# Patient Record
Sex: Male | Born: 1960 | Race: White | Hispanic: No | Marital: Married | Smoking: Former smoker
Health system: Southern US, Community
[De-identification: ages and names within clinical notes are randomized; demographics above are authoritative.]

## PROBLEM LIST (undated history)

## (undated) DIAGNOSIS — G4730 Sleep apnea, unspecified: Secondary | ICD-10-CM

## (undated) DIAGNOSIS — H9190 Unspecified hearing loss, unspecified ear: Secondary | ICD-10-CM

## (undated) DIAGNOSIS — Z87442 Personal history of urinary calculi: Secondary | ICD-10-CM

## (undated) DIAGNOSIS — N529 Male erectile dysfunction, unspecified: Secondary | ICD-10-CM

## (undated) DIAGNOSIS — F17200 Nicotine dependence, unspecified, uncomplicated: Secondary | ICD-10-CM

## (undated) DIAGNOSIS — M1990 Unspecified osteoarthritis, unspecified site: Secondary | ICD-10-CM

## (undated) DIAGNOSIS — R51 Headache: Secondary | ICD-10-CM

## (undated) DIAGNOSIS — I2510 Atherosclerotic heart disease of native coronary artery without angina pectoris: Secondary | ICD-10-CM

## (undated) DIAGNOSIS — I10 Essential (primary) hypertension: Secondary | ICD-10-CM

## (undated) DIAGNOSIS — I739 Peripheral vascular disease, unspecified: Secondary | ICD-10-CM

## (undated) DIAGNOSIS — E291 Testicular hypofunction: Secondary | ICD-10-CM

## (undated) DIAGNOSIS — F419 Anxiety disorder, unspecified: Secondary | ICD-10-CM

## (undated) DIAGNOSIS — K219 Gastro-esophageal reflux disease without esophagitis: Secondary | ICD-10-CM

## (undated) DIAGNOSIS — T7840XA Allergy, unspecified, initial encounter: Secondary | ICD-10-CM

## (undated) DIAGNOSIS — E079 Disorder of thyroid, unspecified: Secondary | ICD-10-CM

## (undated) DIAGNOSIS — E213 Hyperparathyroidism, unspecified: Secondary | ICD-10-CM

## (undated) DIAGNOSIS — E785 Hyperlipidemia, unspecified: Secondary | ICD-10-CM

## (undated) DIAGNOSIS — M549 Dorsalgia, unspecified: Secondary | ICD-10-CM

## (undated) HISTORY — DX: Personal history of urinary calculi: Z87.442

## (undated) HISTORY — DX: Dorsalgia, unspecified: M54.9

## (undated) HISTORY — PX: HEMORRHOID SURGERY: SHX153

## (undated) HISTORY — DX: Gastro-esophageal reflux disease without esophagitis: K21.9

## (undated) HISTORY — DX: Headache: R51

## (undated) HISTORY — DX: Anxiety disorder, unspecified: F41.9

## (undated) HISTORY — DX: Allergy, unspecified, initial encounter: T78.40XA

## (undated) HISTORY — DX: Male erectile dysfunction, unspecified: N52.9

## (undated) HISTORY — DX: Sleep apnea, unspecified: G47.30

## (undated) HISTORY — DX: Atherosclerotic heart disease of native coronary artery without angina pectoris: I25.10

## (undated) HISTORY — DX: Unspecified osteoarthritis, unspecified site: M19.90

## (undated) HISTORY — PX: JOINT REPLACEMENT: SHX530

## (undated) HISTORY — DX: Testicular hypofunction: E29.1

## (undated) HISTORY — PX: CARDIAC CATHETERIZATION: SHX172

## (undated) HISTORY — PX: COLONOSCOPY: SHX174

## (undated) HISTORY — PX: NASAL SEPTUM SURGERY: SHX37

## (undated) HISTORY — DX: Unspecified hearing loss, unspecified ear: H91.90

## (undated) HISTORY — DX: Nicotine dependence, unspecified, uncomplicated: F17.200

## (undated) HISTORY — DX: Hyperlipidemia, unspecified: E78.5

## (undated) HISTORY — DX: Disorder of thyroid, unspecified: E07.9

## (undated) HISTORY — PX: COLON SURGERY: SHX602

---

## 1999-01-10 ENCOUNTER — Ambulatory Visit (HOSPITAL_COMMUNITY): Admission: RE | Admit: 1999-01-10 | Discharge: 1999-01-10 | Payer: Self-pay | Admitting: General Surgery

## 2000-08-11 ENCOUNTER — Ambulatory Visit (HOSPITAL_BASED_OUTPATIENT_CLINIC_OR_DEPARTMENT_OTHER): Admission: RE | Admit: 2000-08-11 | Discharge: 2000-08-11 | Payer: Self-pay | Admitting: Family Medicine

## 2000-08-13 ENCOUNTER — Ambulatory Visit (HOSPITAL_BASED_OUTPATIENT_CLINIC_OR_DEPARTMENT_OTHER): Admission: RE | Admit: 2000-08-13 | Discharge: 2000-08-13 | Payer: Self-pay | Admitting: Family Medicine

## 2000-10-31 ENCOUNTER — Ambulatory Visit (HOSPITAL_BASED_OUTPATIENT_CLINIC_OR_DEPARTMENT_OTHER): Admission: RE | Admit: 2000-10-31 | Discharge: 2000-10-31 | Payer: Self-pay | Admitting: Otolaryngology

## 2001-01-13 ENCOUNTER — Ambulatory Visit (HOSPITAL_BASED_OUTPATIENT_CLINIC_OR_DEPARTMENT_OTHER): Admission: RE | Admit: 2001-01-13 | Discharge: 2001-01-13 | Payer: Self-pay | Admitting: Otolaryngology

## 2001-11-22 ENCOUNTER — Encounter: Payer: Self-pay | Admitting: Emergency Medicine

## 2001-11-22 ENCOUNTER — Emergency Department (HOSPITAL_COMMUNITY): Admission: EM | Admit: 2001-11-22 | Discharge: 2001-11-22 | Payer: Self-pay | Admitting: Emergency Medicine

## 2004-04-10 ENCOUNTER — Ambulatory Visit: Payer: Self-pay | Admitting: Family Medicine

## 2004-06-14 ENCOUNTER — Ambulatory Visit: Payer: Self-pay | Admitting: Family Medicine

## 2005-03-05 ENCOUNTER — Ambulatory Visit: Payer: Self-pay | Admitting: Family Medicine

## 2005-03-13 ENCOUNTER — Ambulatory Visit: Payer: Self-pay | Admitting: Ophthalmology

## 2005-03-13 ENCOUNTER — Ambulatory Visit: Payer: Self-pay | Admitting: Family Medicine

## 2005-05-09 ENCOUNTER — Ambulatory Visit: Payer: Self-pay | Admitting: Family Medicine

## 2006-01-21 ENCOUNTER — Ambulatory Visit: Payer: Self-pay | Admitting: Family Medicine

## 2006-01-25 ENCOUNTER — Encounter: Admission: RE | Admit: 2006-01-25 | Discharge: 2006-01-25 | Payer: Self-pay | Admitting: Family Medicine

## 2006-01-25 ENCOUNTER — Ambulatory Visit: Payer: Self-pay | Admitting: Family Medicine

## 2006-02-08 ENCOUNTER — Ambulatory Visit: Payer: Self-pay | Admitting: Family Medicine

## 2006-05-09 ENCOUNTER — Ambulatory Visit: Payer: Self-pay | Admitting: Family Medicine

## 2006-10-10 ENCOUNTER — Ambulatory Visit: Payer: Self-pay | Admitting: Family Medicine

## 2006-10-10 DIAGNOSIS — H9190 Unspecified hearing loss, unspecified ear: Secondary | ICD-10-CM | POA: Insufficient documentation

## 2007-09-12 ENCOUNTER — Ambulatory Visit: Payer: Self-pay | Admitting: Family Medicine

## 2007-09-12 DIAGNOSIS — F17200 Nicotine dependence, unspecified, uncomplicated: Secondary | ICD-10-CM | POA: Insufficient documentation

## 2007-09-12 LAB — CONVERTED CEMR LAB
Bacteria, UA: 0
Bilirubin Urine: NEGATIVE
Casts: 0 /lpf
Glucose, Urine, Semiquant: NEGATIVE
Ketones, urine, test strip: NEGATIVE
Mucus, UA: 0
Nitrite: NEGATIVE
Protein, U semiquant: NEGATIVE
Specific Gravity, Urine: <1.005
Urine crystals, microscopic: 0 /hpf
Urobilinogen, UA: 0.2
WBC Urine, dipstick: NEGATIVE
WBC, UA: 0 cells/hpf
Yeast, UA: 0
pH: 7

## 2007-11-27 ENCOUNTER — Telehealth: Payer: Self-pay | Admitting: Family Medicine

## 2008-06-22 ENCOUNTER — Ambulatory Visit: Payer: Self-pay | Admitting: Family Medicine

## 2009-05-11 ENCOUNTER — Ambulatory Visit: Payer: Self-pay | Admitting: Family Medicine

## 2009-05-11 DIAGNOSIS — N529 Male erectile dysfunction, unspecified: Secondary | ICD-10-CM

## 2009-05-11 DIAGNOSIS — Z87442 Personal history of urinary calculi: Secondary | ICD-10-CM

## 2009-05-11 LAB — CONVERTED CEMR LAB
Bacteria, UA: 0
Bilirubin Urine: NEGATIVE
Casts: 0 /lpf
Glucose, Urine, Semiquant: NEGATIVE
Ketones, urine, test strip: NEGATIVE
Nitrite: NEGATIVE
RBC / HPF: 0
Specific Gravity, Urine: 1.02
Urobilinogen, UA: 0.2
WBC Urine, dipstick: NEGATIVE
WBC, UA: 0 cells/hpf
Yeast, UA: 0
pH: 6

## 2009-05-12 LAB — CONVERTED CEMR LAB
ALT: 23 units/L (ref 0–53)
AST: 19 units/L (ref 0–37)
Albumin: 4.1 g/dL (ref 3.5–5.2)
Alkaline Phosphatase: 87 units/L (ref 39–117)
BUN: 10 mg/dL (ref 6–23)
Basophils Absolute: 0 10*3/uL (ref 0.0–0.1)
Basophils Relative: 0.1 % (ref 0.0–3.0)
Bilirubin, Direct: 0 mg/dL (ref 0.0–0.3)
CO2: 30 meq/L (ref 19–32)
Calcium: 10.6 mg/dL — ABNORMAL HIGH (ref 8.4–10.5)
Chloride: 100 meq/L (ref 96–112)
Cholesterol: 276 mg/dL — ABNORMAL HIGH (ref 0–200)
Creatinine, Ser: 0.9 mg/dL (ref 0.4–1.5)
Direct LDL: 220 mg/dL
Eosinophils Absolute: 0.2 10*3/uL (ref 0.0–0.7)
Eosinophils Relative: 2.2 % (ref 0.0–5.0)
Folate: 5.6 ng/mL
GFR calc non Af Amer: 95.57 mL/min (ref >60)
Glucose, Bld: 100 mg/dL — ABNORMAL HIGH (ref 70–99)
HCT: 47.9 % (ref 39.0–52.0)
HDL: 42.3 mg/dL (ref >39.00)
Hemoglobin: 15.5 g/dL (ref 13.0–17.0)
Lymphocytes Relative: 32 % (ref 12.0–46.0)
Lymphs Abs: 3.3 10*3/uL (ref 0.7–4.0)
MCHC: 32.5 g/dL (ref 30.0–36.0)
MCV: 99.7 fL (ref 78.0–100.0)
Monocytes Absolute: 1 10*3/uL (ref 0.1–1.0)
Monocytes Relative: 9.8 % (ref 3.0–12.0)
Neutro Abs: 5.8 10*3/uL (ref 1.4–7.7)
Neutrophils Relative %: 55.9 % (ref 43.0–77.0)
Platelets: 257 10*3/uL (ref 150.0–400.0)
Potassium: 4.4 meq/L (ref 3.5–5.1)
RBC: 4.8 M/uL (ref 4.22–5.81)
RDW: 12.2 % (ref 11.5–14.6)
Sodium: 135 meq/L (ref 135–145)
TSH: 1.17 microintl units/mL (ref 0.35–5.50)
Testosterone: 329.94 ng/dL — ABNORMAL LOW (ref 350.00–890.00)
Total Bilirubin: 0.5 mg/dL (ref 0.3–1.2)
Total CHOL/HDL Ratio: 7
Total Protein: 7.9 g/dL (ref 6.0–8.3)
Triglycerides: 201 mg/dL — ABNORMAL HIGH (ref 0.0–149.0)
VLDL: 40.2 mg/dL — ABNORMAL HIGH (ref 0.0–40.0)
Vitamin B-12: 543 pg/mL (ref 211–911)
WBC: 10.3 10*3/uL (ref 4.5–10.5)

## 2009-05-25 ENCOUNTER — Ambulatory Visit: Payer: Self-pay | Admitting: Family Medicine

## 2009-05-25 DIAGNOSIS — E785 Hyperlipidemia, unspecified: Secondary | ICD-10-CM

## 2009-05-25 DIAGNOSIS — E349 Endocrine disorder, unspecified: Secondary | ICD-10-CM

## 2009-07-25 ENCOUNTER — Ambulatory Visit: Payer: Self-pay | Admitting: Family Medicine

## 2009-07-27 LAB — CONVERTED CEMR LAB
ALT: 32 units/L (ref 0–53)
AST: 25 units/L (ref 0–37)
Cholesterol: 225 mg/dL — ABNORMAL HIGH (ref 0–200)
Direct LDL: 150.5 mg/dL
HDL: 53.5 mg/dL (ref >39.00)
Total CHOL/HDL Ratio: 4
Triglycerides: 208 mg/dL — ABNORMAL HIGH (ref 0.0–149.0)
VLDL: 41.6 mg/dL — ABNORMAL HIGH (ref 0.0–40.0)

## 2009-08-19 ENCOUNTER — Ambulatory Visit: Payer: Self-pay | Admitting: Family Medicine

## 2010-01-23 ENCOUNTER — Encounter (INDEPENDENT_AMBULATORY_CARE_PROVIDER_SITE_OTHER): Payer: Self-pay | Admitting: *Deleted

## 2010-05-02 NOTE — Assessment & Plan Note (Signed)
Summary: CPX/MK   Vital Signs:  Patient profile:   50 year old male Height:      71 inches Weight:      181 pounds BMI:     25.34 Temp:     97.6 degrees F oral Pulse rate:   80 / minute Pulse rhythm:   regular BP sitting:   118 / 82  (left arm) Cuff size:   regular  Vitals Entered By: Lewanda Rife LPN (May 11, 2009 10:41 AM)  History of Present Illness: here for health mt exam  just getting over the flu  did not get flu shot this year- unusual for him wife had it too  is getting better   last Td 5 years ago  never had pneumovax   wt is up 3 lb with good bmi  bp good 118/82  no energy at all - really tired for a while -- over a year  not depressed / anx is in good control  not sleeping as well with the flu - but normally good  is getting enough exercise at work -- 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 Zoloft  Past History:  Family History: Last updated: 05/11/2009 mother deg disc dz father - does not know anything about  GM died of heart probls at 26 no DM  GF with lung cancer  no colon or prostate cancer   Social History: Last updated: 05/11/2009 current smoker occ alcohol -- 2 drinks per week  married  coaches basketball   Past Medical History: tabacco use headache anxiety  allergic rhinitis  Past Surgical History: colonoscopy 13 years ago  hemorroid surg   Family History: mother deg disc dz father - does not know anything about  GM died of heart probls at 68 no DM  GF with lung cancer  no colon or prostate cancer   Social History: current smoker occ alcohol -- 2 drinks per week  married  coaches  basketball   Review of Systems General:  Complains of fatigue; denies chills, fever, loss of appetite, and malaise. Eyes:  Denies blurring and eye irritation. CV:  Denies chest pain or discomfort, lightheadness, and palpitations. Resp:  Denies cough and wheezing. GI:  Denies abdominal pain, bloody stools, and change in bowel habits. GU:  Complains of decreased libido and erectile dysfunction; denies discharge, dysuria, and hematuria. MS:  Denies joint pain, joint redness, and joint swelling. Derm:  Denies lesion(s), poor wound healing, and rash. Neuro:  Denies numbness and tingling. Psych:  Denies anxiety and depression. Endo:  Denies excessive thirst and excessive urination. Heme:  Denies abnormal bruising and bleeding.  Physical Exam  General:  Well-developed,well-nourished,in no acute distress; alert,appropriate and cooperative throughout examination Head:  normocephalic, atraumatic, and no abnormalities observed.   Eyes:  vision grossly intact, pupils equal, pupils round, and pupils reactive to light.  no conjunctival pallor, injection or icterus  Ears:  R ear normal and L ear normal.   Nose:  no nasal discharge.   Mouth:  pharynx  pink and moist.   Neck:  supple with full rom and no masses or thyromegally, no JVD or carotid bruit  Chest Wall:  No deformities, masses, tenderness or gynecomastia noted. Lungs:  diffusely distant bs with harsh sounds at bases no wheeze/rales/rhonchi no prolonged exp phase  Heart:  Normal rate and regular rhythm. S1 and S2 normal without gallop, murmur, click, rub or other extra sounds. Abdomen:  Bowel sounds positive,abdomen soft and non-tender without masses, organomegaly or hernias noted. no renal bruits  Rectal:  No external abnormalities noted. Normal sphincter tone. No rectal masses or tenderness. Prostate:  Prostate gland firm and smooth, no enlargement, nodularity, tenderness, mass, asymmetry or induration. Msk:  No deformity or scoliosis  noted of thoracic or lumbar spine.  no acute joint changes  Pulses:  R and L carotid,radial,femoral,dorsalis pedis and posterior tibial pulses are full and equal bilaterally Extremities:  No clubbing, cyanosis, edema, or deformity noted with normal full range of motion of all joints.   Neurologic:  sensation intact to light touch, gait normal, and DTRs symmetrical and normal.   Skin:  Intact without suspicious lesions or rashes lentigos diffusely  Cervical Nodes:  No lymphadenopathy noted Inguinal Nodes:  No significant adenopathy Psych:  normal affect, talkative and pleasant    Impression & Recommendations:  Problem # 1:  HEALTH MAINTENANCE EXAM (ICD-V70.0) Assessment Comment Only reviewed health habits including diet, exercise and skin cancer prevention reviewed health maintenance list and family history  adv again to quit smoking Orders: Venipuncture (16109) TLB-Lipid Panel (80061-LIPID) TLB-BMP (Basic Metabolic Panel-BMET) (80048-METABOL) TLB-CBC Platelet - w/Differential (85025-CBCD) TLB-Hepatic/Liver Function Pnl (80076-HEPATIC) TLB-TSH (Thyroid Stimulating Hormone) (84443-TSH) TLB-B12 + Folate Pnl (60454_09811-B14/NWG)  Problem # 2:  FATIGUE (ICD-780.79) Assessment: New possibly multifactorial- will check labs  also testosterone level in light of ED f/u in 2 wk to disc results Orders: TLB-Testosterone, Total (84403-TESTO) Venipuncture (95621) TLB-Lipid Panel (80061-LIPID) TLB-BMP (Basic Metabolic Panel-BMET) (80048-METABOL) TLB-CBC Platelet - w/Differential (85025-CBCD) TLB-Hepatic/Liver Function Pnl (80076-HEPATIC) TLB-TSH (Thyroid Stimulating Hormone) (84443-TSH) TLB-B12 + Folate Pnl ([id number]-G29/BMW)  Problem # 3:  RENAL CALCULUS, HX OF (ICD-V13.01) Assessment: Unchanged  ua today- normal on micro  asymptomatic now will need to watch - re eval if symptoms re- occur   Orders: UA Dipstick W/ Micro (manual) (41324)  Problem # 4:  ERECTILE DYSFUNCTION,  ORGANIC (ICD-607.84) Assessment: New new with some fatigue and low libido and ED check testosterone level f/u 2 wk to disc furher  Orders: TLB-Testosterone, Total (84403-TESTO) Venipuncture (40102) TLB-Lipid Panel (80061-LIPID) TLB-BMP (Basic Metabolic Panel-BMET) (80048-METABOL) TLB-CBC Platelet - w/Differential (85025-CBCD) TLB-Hepatic/Liver Function Pnl (80076-HEPATIC) TLB-TSH (Thyroid Stimulating Hormone) (84443-TSH) TLB-B12 + Folate Pnl ([id number]-K74/QVZ)  Problem # 5:  TOBACCO USE (ICD-305.1) Assessment: Unchanged discussed in detail risks of smoking, and possible outcomes including COPD, vascular dz, cancer and also respiratory infections/sinus problems   again adv to quit   Complete Medication List: 1)  Claritin 10 Mg Tabs (Loratadine) .... Once daily  Patient Instructions: 1)  labs today for wellness and fatigue 2)  please work on quitting smoking  3)  if urinary symptoms return - please update me  4)  work on healthy diet and exercise  5)  schedule follow up in 2 weeks   Current Allergies (reviewed today): ! ZOLOFT   Laboratory Results   Urine Tests  Date/Time Received: May 11, 2009 11:38 AM  Date/Time Reported: May 11, 2009 11:38 AM   Routine Urinalysis   Color: yellow Appearance: Clear Glucose: negative   (  Normal Range: Negative) Bilirubin: negative   (Normal Range: Negative) Ketone: negative   (Normal Range: Negative) Spec. Gravity: 1.020   (Normal Range: 1.003-1.035) Blood: trace-intact   (Normal Range: Negative) pH: 6.0   (Normal Range: 5.0-8.0) Protein: trace   (Normal Range: Negative) Urobilinogen: 0.2   (Normal Range: 0-1) Nitrite: negative   (Normal Range: Negative) Leukocyte Esterace: negative   (Normal Range: Negative)  Urine Microscopic WBC/HPF: 0 RBC/HPF: 0 Bacteria/HPF: 0 Mucous/HPF: few Epithelial/HPF: 1-3 Crystals/HPF: few Casts/LPF: 0 Yeast/HPF: 0 Other: 0       Laboratory Results   Urine  Tests    Routine Urinalysis   Color: yellow Appearance: Clear Glucose: negative   (Normal Range: Negative) Bilirubin: negative   (Normal Range: Negative) Ketone: negative   (Normal Range: Negative) Spec. Gravity: 1.020   (Normal Range: 1.003-1.035) Blood: trace-intact   (Normal Range: Negative) pH: 6.0   (Normal Range: 5.0-8.0) Protein: trace   (Normal Range: Negative) Urobilinogen: 0.2   (Normal Range: 0-1) Nitrite: negative   (Normal Range: Negative) Leukocyte Esterace: negative   (Normal Range: Negative)  Urine Microscopic WBC/hpf: 0 RBC/hpf: 0 Bacteria: 0 Mucous: few Epithelial: 1-3 Crystals/LPF: few Casts/LPF: 0 Yeast/HPF: 0 Other: 0

## 2010-05-02 NOTE — Letter (Signed)
Summary: [place] No Show Letter  [place] at [facility]  [address] [place], [place] [address]   Phone: [phone]  Fax: [phone]    01/23/2010 MRN: [id number]  XENG KUCHER [address] [place], [place]  [address]   Dear Mr. Ngo,   Our records indicate that you missed your scheduled appointment with _______lab______________ on ___10.21.11_________.  Please contact this office to reschedule your appointment as soon as possible.  It is important that you keep your scheduled appointments with your physician, so we can provide you the best care possible.  Please be advised that there may be a charge for "no show" appointments.    Sincerely,   [place] at [facility]

## 2010-05-02 NOTE — Assessment & Plan Note (Signed)
Summary: ROA 3 MTHS CYD   Vital Signs:  Patient profile:   50 year old male Height:      71 inches Weight:      183.25 pounds BMI:     25.65 Temp:     98.3 degrees F oral Pulse rate:   76 / minute Pulse rhythm:   regular BP sitting:   148 / 90  (left arm) Cuff size:   regular  Vitals Entered By: Lewanda Rife LPN (Aug 19, 2009 3:33 PM)  Serial Vital Signs/Assessments:  Time      Position  BP       Pulse  Resp  Temp     By                     132/80                         Judith Part MD  CC: three month f/u after labs   History of Present Illness: here for f/u of and high chol and ED-- and inc bp   last visit given 20 mg crestor  no problems overall  occ shoulder pain-- from sleeping in certain postions  LDL chol went down from 220 to 150-- much imp pt is stongly resistant to taking the 40 mg  HDL 53 and trig 208 diet -- is better -- gave up red meat, gave up ham buiscuits for the most part  some walking after work - not as much as he used to   given cialis 2.5 to try 1-2 at a time  tried it and it did work -- at dose of 4 pills -- still not as strong as the viagra    bp is up today at 148/90 -- unusual  put salt on his lunch today-- which he usually does not do  just got some stressful news a few minutes ago   tab -- no change in that   lost his crestor coupon   had more than 2 beers on a few occasions- not often -- and holds crestor that day   Allergies: 1)  ! Zoloft  Past History:  Past Medical History: Last updated: 05/11/2009 tabacco use headache anxiety  allergic rhinitis  Past Surgical History: Last updated: 05/11/2009 colonoscopy 13 years ago  hemorroid surg   Family History: Last updated: 05/11/2009 mother deg disc dz father - does not know anything about  GM died of heart probls at 60 no DM  GF with lung cancer  no colon or prostate cancer   Social History: Last updated: 05/11/2009 current smoker occ alcohol -- 2 drinks per  week  married  coaches basketball   Review of Systems General:  Denies fatigue, fever, loss of appetite, and malaise. Eyes:  Denies blurring and eye irritation. CV:  Denies chest pain or discomfort, palpitations, and shortness of breath with exertion. Resp:  Denies cough and wheezing. GU:  Complains of erectile dysfunction; denies discharge and dysuria. MS:  Complains of joint pain; denies joint redness, joint swelling, muscle aches, and cramps. Derm:  Denies lesion(s), poor wound healing, and rash. Neuro:  Denies headaches, numbness, and tingling. Psych:  mood has been fairly stable . Endo:  Denies excessive thirst and excessive urination. Heme:  Denies abnormal bruising and bleeding.  Physical Exam  General:  Well-developed,well-nourished,in no acute distress; alert,appropriate and cooperative throughout examination Head:  normocephalic, atraumatic, and no abnormalities observed.   Eyes:  vision grossly intact, pupils equal, pupils round, and pupils reactive to light.   Mouth:  pharynx pink and moist.   Neck:  supple with full rom and no masses or thyromegally, no JVD or carotid bruit  Chest Wall:  No deformities, masses, tenderness or gynecomastia noted. Lungs:  diffusely distant bs with harsh sounds at bases no wheeze/rales/rhonchi no prolonged exp phase  Heart:  Normal rate and regular rhythm. S1 and S2 normal without gallop, murmur, click, rub or other extra sounds. Msk:  No deformity or scoliosis noted of thoracic or lumbar spine.   Extremities:  No clubbing, cyanosis, edema, or deformity noted with normal full range of motion of all joints.   Neurologic:  sensation intact to light touch, gait normal, and DTRs symmetrical and normal.   Skin:  Intact without suspicious lesions or rashes Cervical Nodes:  No lymphadenopathy noted Inguinal Nodes:  No significant adenopathy Psych:  normal affect, talkative and pleasant    Impression & Recommendations:  Problem # 1:   HYPERLIPIDEMIA (ICD-272.4) Assessment Improved  much imp with  crestor 20 -- would like to inc to 40 but pt is apprehensive again rev low sat fat diet  re chck 6 mo  His updated medication list for this problem includes:    Crestor 20 Mg Tabs (Rosuvastatin calcium) .[name]... 1 by mouth once daily  Labs Reviewed: SGOT: 25 (07/25/2009)   SGPT: 32 (07/25/2009)   HDL:53.50 (07/25/2009), 42.30 (05/11/2009)  Chol:225 (07/25/2009), 276 (05/11/2009)  Trig:208.0 (07/25/2009), 201.0 (05/11/2009)  Problem # 2:  ERECTILE DYSFUNCTION, ORGANIC (ICD-607.84) Assessment: Improved  doing best with cialis 10-- sent refil to pharmacy  not interested in urol ref at this time will update me if he changes his mind His updated medication list for this problem includes:    Cialis 10 Mg Tabs (Tadalafil) .[name]... 1 by mouth once daily as needed as directed 30 minutes before sexual activity  Orders: Prescription Created Electronically [phone])  Complete Medication List: 1)  Claritin 10 Mg Tabs (Loratadine) .... Once daily as needed 2)  Crestor 20 Mg Tabs (Rosuvastatin calcium) .[name].. 1 by mouth once daily 3)  Cialis 10 Mg Tabs (Tadalafil) .[name].. 1 by mouth once daily as needed as directed 30 minutes before sexual activity  Patient Instructions: 1)  I sent cialis px to the pharmacy  2)  stay on the crestor and keep watching diet  3)  you can raise your HDL (good cholesterol) by increasing exercise and eating omega 3 fatty acid supplement like fish oil or flax seed oil over the counter 4)  you can lower LDL (bad cholesterol) by limiting saturated fats in diet like red meat, fried foods, egg yolks, fatty breakfast meats, high fat dairy products and shellfish  5)  blood pressure is better on second check today 6)  schedule fasting labs in 6 months lipid/ast/alt 272  Prescriptions: CIALIS 10 MG TABS (TADALAFIL) 1 by mouth once daily as needed as directed 30 minutes before sexual activity  #9 x 11   Entered and Authorized  by:   Judith Part MD   Signed by:   Melody Comas on 08/19/2009   Method used:   Electronically to        [facility]  [address] (retail)       [address]       [place], [place]  [address]       Ph: [phone] or [phone]       Fax: [phone]   RxID:  [id number]   Current Allergies (reviewed today): ! ZOLOFT

## 2010-05-02 NOTE — Assessment & Plan Note (Signed)
Summary: 2 WEEK FOLLOW UP/[facility]   Vital Signs:  Patient profile:   50 year old male Height:      71 inches Weight:      183.25 pounds BMI:     25.65 Temp:     98.4 degrees F oral Pulse rate:   88 / minute Pulse rhythm:   regular BP sitting:   128 / 82  (left arm) Cuff size:   regular  Vitals Entered By: Lewanda Rife LPN (May 25, 2009 3:32 PM)  History of Present Illness: here for f/u of mult med probs incl fatigue/ low testosterone/ high lipids/ ED  LDL 220 cholesterol  has always been high  diet does not help even when low fat  lipitor made him tired   testosterone level is slightly low   is having erectile dysfunction problems  thinks it started around the time he took zoloft years ago -- and he stopped it   can get an erection- but may not keep it long enough  no concerns about std drive is down too and that makes him anxious  no problems like this in the family  no lumps or pain in his testicles  no change in semen color -- is less than it used to be in volume   has had viagra in the past -- it worked fine for him - no problems  used the 100 mg -- (did not work as well when he split them)  was wondering about cialis -- because timing is an issue   needs pneumovax also   Allergies: 1)  ! Zoloft  Past History:  Past Medical History: Last updated: 05/11/2009 tabacco use headache anxiety  allergic rhinitis  Past Surgical History: Last updated: 05/11/2009 colonoscopy 13 years ago  hemorroid surg   Family History: Last updated: 05/11/2009 mother deg disc dz father - does not know anything about  GM died of heart probls at 33 no DM  GF with lung cancer  no colon or prostate cancer   Social History: Last updated: 05/11/2009 current smoker occ alcohol -- 2 drinks per week  married  coaches basketball   Review of Systems General:  Complains of fatigue; denies loss of appetite and malaise. Eyes:  Denies blurring and discharge. CV:  Denies chest  pain or discomfort, palpitations, and shortness of breath with exertion. Resp:  Denies cough, shortness of breath, and wheezing. GI:  Denies abdominal pain, bloody stools, change in bowel habits, and indigestion. GU:  Complains of decreased libido and erectile dysfunction; denies discharge, dysuria, and genital sores. MS:  Denies joint pain. Derm:  Denies itching, lesion(s), poor wound healing, and rash. Neuro:  Denies numbness, tingling, and weakness. Psych:  Denies anxiety and depression. Endo:  Denies cold intolerance, excessive thirst, excessive urination, and heat intolerance.  Physical Exam  General:  Well-developed,well-nourished,in no acute distress; alert,appropriate and cooperative throughout examination Head:  normocephalic, atraumatic, and no abnormalities observed.   Eyes:  vision grossly intact, pupils equal, pupils round, and pupils reactive to light.  no conjunctival pallor, injection or icterus  Mouth:  pharynx pink and moist.   Neck:  supple with full rom and no masses or thyromegally, no JVD or carotid bruit  Lungs:  diffusely distant bs with harsh sounds at bases no wheeze/rales/rhonchi no prolonged exp phase  Heart:  Normal rate and regular rhythm. S1 and S2 normal without gallop, murmur, click, rub or other extra sounds. Abdomen:  Bowel sounds positive,abdomen soft and non-tender without masses, organomegaly  or hernias noted. no renal bruits  Genitalia:  Testes bilaterally descended without nodularity, tenderness or masses. No scrotal masses or lesions. No penis lesions or urethral discharge. Msk:  No deformity or scoliosis noted of thoracic or lumbar spine.   Pulses:  R and L carotid,radial,femoral,dorsalis pedis and posterior tibial pulses are full and equal bilaterally Extremities:  No clubbing, cyanosis, edema, or deformity noted with normal full range of motion of all joints.   Neurologic:  sensation intact to light touch, gait normal, and DTRs symmetrical and  normal.  no tremor  Skin:  Intact without suspicious lesions or rashes Cervical Nodes:  No lymphadenopathy noted Inguinal Nodes:  No significant adenopathy Psych:  nl affect   Impression & Recommendations:  Problem # 1:  HYPERLIPIDEMIA (ICD-272.4) Assessment Deteriorated  with very high LDL suspect hereditary  rev labs in detail today will need statin likely to get it down rev low sat fat diet intol of lipitor will have trial of crestor and update labs in 6 weeks   His updated medication list for this problem includes:    Crestor 20 Mg Tabs (Rosuvastatin calcium) .[name]... 1 by mouth once daily  Problem # 2:  ERECTILE DYSFUNCTION, ORGANIC (ICD-607.84) Assessment: Unchanged likely multifactorial with mildly low testosterone and also very high chol tolerated viagra in past  EKG is nl  trial of cialis -- given 2.5 mg to try - and disc options for taking it with poss side eff pt knows if cp or blurred vision to stop it and update   Problem # 3:  HYPOGONADISM (ICD-257.2) Assessment: New mild testosterone def with some fatigue and dec sex drive  as of now - symptoms are mild and will observe- considering urol eval or tx in future if symptoms worsen  Problem # 4:  TOBACCO USE (ICD-305.1) Assessment: Unchanged again adv to quit smoking to help all med problems  also pneumovax today for protection  Complete Medication List: 1)  Claritin 10 Mg Tabs (Loratadine) .... Once daily as needed 2)  Crestor 20 Mg Tabs (Rosuvastatin calcium) .[name].. 1 by mouth once daily  Other Orders: Pneumococcal Vaccine (29562) Admin 1st Vaccine (13086)  Patient Instructions: 1)  here are low dose cialis samples 2.5 mg  2)  try 2 of them (5 mg) -- per dose (lasting 36 hours) and let me know how that works -- we can dose 10 mg if that does not work 3)  also there is an option of taking 2.5 mg every day -- depending on how your symptoms are  4)  if any side effects including chest pain or changed vision  let me know  5)  try crestor 20 mg -again update me if any side effects  6)  schedule fasting labs in 6 weeks lipid/ast/alt 272 and watch fat in your diet   7)  follow up with me in about 3 months  Prescriptions: CRESTOR 20 MG TABS (ROSUVASTATIN CALCIUM) 1 by mouth once daily  #30 x 11   Entered and Authorized by:   Judith Part MD   Signed by:   Judith Part MD on 05/25/2009   Method used:   Print then Give to Patient   RxID:   [id number]   Current Allergies (reviewed today): ! ZOLOFT   EKG  Procedure date:  05/25/2009  Findings:      NSR with rate of 77 and no acute changes     Immunizations Administered:  Pneumonia Vaccine:    Vaccine Type:  Pneumovax    Site: left deltoid    Mfr: Merck    Dose: 0.5 ml    Route: IM    Given by: Lewanda Rife LPN    Exp. Date: 11/14/2010    Lot #: [id number]    VIS given: 10/29/95 version given May 25, 2009.  Appended Document: Orders Update    Clinical Lists Changes  Orders: Added new Service order of EKG w/ Interpretation (93000) - Signed

## 2010-07-05 ENCOUNTER — Emergency Department (HOSPITAL_COMMUNITY): Payer: Managed Care, Other (non HMO)

## 2010-07-05 ENCOUNTER — Inpatient Hospital Stay (HOSPITAL_COMMUNITY)
Admission: EM | Admit: 2010-07-05 | Discharge: 2010-07-06 | DRG: 287 | Disposition: A | Discharge status: Home / Self Care | Payer: Managed Care, Other (non HMO) | Attending: Cardiovascular Disease | Admitting: Cardiovascular Disease

## 2010-07-05 DIAGNOSIS — E785 Hyperlipidemia, unspecified: Secondary | ICD-10-CM | POA: Diagnosis present

## 2010-07-05 DIAGNOSIS — I4510 Unspecified right bundle-branch block: Secondary | ICD-10-CM | POA: Diagnosis present

## 2010-07-05 DIAGNOSIS — I2510 Atherosclerotic heart disease of native coronary artery without angina pectoris: Principal | ICD-10-CM | POA: Diagnosis present

## 2010-07-05 DIAGNOSIS — Z87442 Personal history of urinary calculi: Secondary | ICD-10-CM

## 2010-07-05 DIAGNOSIS — I10 Essential (primary) hypertension: Secondary | ICD-10-CM | POA: Diagnosis present

## 2010-07-05 DIAGNOSIS — F411 Generalized anxiety disorder: Secondary | ICD-10-CM | POA: Diagnosis present

## 2010-07-05 DIAGNOSIS — Z7982 Long term (current) use of aspirin: Secondary | ICD-10-CM

## 2010-07-05 DIAGNOSIS — J301 Allergic rhinitis due to pollen: Secondary | ICD-10-CM | POA: Diagnosis present

## 2010-07-05 DIAGNOSIS — K219 Gastro-esophageal reflux disease without esophagitis: Secondary | ICD-10-CM | POA: Diagnosis present

## 2010-07-05 DIAGNOSIS — R079 Chest pain, unspecified: Secondary | ICD-10-CM

## 2010-07-05 DIAGNOSIS — Z79899 Other long term (current) drug therapy: Secondary | ICD-10-CM

## 2010-07-05 DIAGNOSIS — F17200 Nicotine dependence, unspecified, uncomplicated: Secondary | ICD-10-CM | POA: Diagnosis present

## 2010-07-05 LAB — DIFFERENTIAL
Basophils Absolute: 0.1 10*3/uL (ref 0.0–0.1)
Basophils Relative: 1 % (ref 0–1)
Monocytes Relative: 9 % (ref 3–12)
Neutro Abs: 6.3 10*3/uL (ref 1.7–7.7)
Neutrophils Relative %: 55 % (ref 43–77)

## 2010-07-05 LAB — COMPREHENSIVE METABOLIC PANEL
AST: 24 U/L (ref 0–37)
Albumin: 3.8 g/dL (ref 3.5–5.2)
Calcium: 10.4 mg/dL (ref 8.4–10.5)
Creatinine, Ser: 0.87 mg/dL (ref 0.4–1.5)
GFR calc Af Amer: >60 mL/min (ref >60)
Total Protein: 6.9 g/dL (ref 6.0–8.3)

## 2010-07-05 LAB — LIPID PANEL
Total CHOL/HDL Ratio: 5.4 RATIO
VLDL: 37 mg/dL (ref 0–40)

## 2010-07-05 LAB — POCT CARDIAC MARKERS
CKMB, poc: <1 ng/mL — ABNORMAL LOW (ref 1.0–8.0)
Myoglobin, poc: 49.4 ng/mL (ref 12–200)
Troponin i, poc: <0.05 ng/mL (ref 0.00–0.09)

## 2010-07-05 LAB — CARDIAC PANEL(CRET KIN+CKTOT+MB+TROPI)
Relative Index: INVALID (ref 0.0–2.5)
Troponin I: <0.01 ng/mL (ref 0.00–0.06)

## 2010-07-05 LAB — MRSA PCR SCREENING: MRSA by PCR: NEGATIVE

## 2010-07-05 LAB — PROTIME-INR: INR: 0.99 (ref 0.00–1.49)

## 2010-07-05 LAB — CK TOTAL AND CKMB (NOT AT ARMC)
CK, MB: 1 ng/mL (ref 0.3–4.0)
Total CK: 42 U/L (ref 7–232)

## 2010-07-05 LAB — CBC
Hemoglobin: 15.3 g/dL (ref 13.0–17.0)
MCH: 32.8 pg (ref 26.0–34.0)
RBC: 4.66 MIL/uL (ref 4.22–5.81)

## 2010-07-05 LAB — HEMOGLOBIN A1C: Hgb A1c MFr Bld: 5.7 % — ABNORMAL HIGH (ref <5.7)

## 2010-07-05 LAB — APTT: aPTT: 28 seconds (ref 24–37)

## 2010-07-06 DIAGNOSIS — I2510 Atherosclerotic heart disease of native coronary artery without angina pectoris: Secondary | ICD-10-CM

## 2010-07-06 LAB — CARDIAC PANEL(CRET KIN+CKTOT+MB+TROPI)
CK, MB: 0.8 ng/mL (ref 0.3–4.0)
Relative Index: INVALID (ref 0.0–2.5)
Total CK: 33 U/L (ref 7–232)

## 2010-07-06 LAB — CBC
Hemoglobin: 13.8 g/dL (ref 13.0–17.0)
MCH: 32.5 pg (ref 26.0–34.0)
RBC: 4.24 MIL/uL (ref 4.22–5.81)
WBC: 15.6 10*3/uL — ABNORMAL HIGH (ref 4.0–10.5)

## 2010-07-06 LAB — HEPARIN LEVEL (UNFRACTIONATED): Heparin Unfractionated: 0.39 IU/mL (ref 0.30–0.70)

## 2010-07-10 ENCOUNTER — Encounter: Payer: Self-pay | Admitting: Family Medicine

## 2010-07-11 NOTE — Discharge Summary (Addendum)
  NAMEGASPARE, NETZEL NO.:  [id number]  MEDICAL RECORD NO.:  [id number]           PATIENT TYPE:  I  LOCATION:  2008                         FACILITY:  [facility]  PHYSICIAN:  [name], M.D. DATE OF BIRTH:  [birth year]  DATE OF ADMISSION:  07/05/2010 DATE OF DISCHARGE:  07/06/2010                              DISCHARGE SUMMARY   Please note upon discharge, the patient noted that he has previously been on Crestor and it was discontinued secondary to myalgias.  He had a similar reaction on Lipitor and has avoided statin since.  Due to this hyperlipidemia, he will be tried on pravastatin at discharge and placed Crestor and instructed to notify if he develop myalgias from this medicine as well.  As stated before, he should have a followup of lipid panel and LFTs in 4-6 weeks.     [name], P.A.C.   ______________________________ [name], M.D.    DD/MEDQ  D:  07/06/2010  T:  07/07/2010  Job:  [id number]  cc:   Marne A. Milinda Antis, MD  Electronically Signed by Kristeen Miss M.D. on 07/11/2010 09:10:14 AM Electronically Signed by [name]  on 07/11/2010 12:23:27 PM

## 2010-07-11 NOTE — Discharge Summary (Addendum)
NAMESHAWNTE, DEMAREST NO.:  [id number]  MEDICAL RECORD NO.:  [id number]           PATIENT TYPE:  I  LOCATION:  2008                         FACILITY:  [facility]  PHYSICIAN:  [name], M.D. DATE OF BIRTH:  June 28, 1960  DATE OF ADMISSION:  07/05/2010 DATE OF DISCHARGE:  07/06/2010                              DISCHARGE SUMMARY   DISCHARGE DIAGNOSES: 1. Chest pain, negative for myocardial infarction this admission. 2. Newly diagnosed mild nonobstructive coronary artery disease by cath     July 06, 2010 with normal LV function. 3. Hyperlipidemia with total cholesterol 249, triglycerides 186, HDL     46, LDL 166. 4. Ongoing tobacco abuse, counseled. 5. Borderline hypertension. 6. History of nephrolithiasis. 7. Anxiety. 8. ED. 9. Seasonal allergies. 10.History of colonoscopy/hemorrhoid removal. 11.Status post knee surgery and uvulectomy.  HISTORY OF PRESENT ILLNESS:  Mr. Antonio Russell is a 50 year old gentleman with no prior history of coronary artery disease, with a history of hyperlipidemia, ongoing tobacco abuse, and borderline hypertension.  He presented to [facility], complaints of sudden onset of substernal chest pain without significant exertion associated with some nausea, mild shortness of breath, and slight diaphoresis.  He received aspirin and sublingual nitro in the ER and his pain decreased to 3/10, but had since come back up to 4/10 by the time of evaluation with Cardiology in the ER.  He thought it may be worse with deep inspiration, but there was no change with position or cough.  He was not tachycardic or tachypneic and was satting 98% on room air.  Chest x-ray showed probable COPD and EKG showed sinus rhythm with incomplete right bundle and no acute changes.  Cardiac enzymes initially were negative and the patient was admitted to the hospital under the Cardiology Service.  His enzymes were cycled and remained negative.  However given his  risk factor profile, he underwent cardiac catheterization by Dr. Elease Hashimoto today July 06, 2010, which revealed mild nonobstructive CAD with recommendations for continued medical management and aggressive risk factor modification.  The full dictated report is pending.  Dr. Elease Hashimoto had seen and examined him today and feels he is stable for discharge, with emphasis on quitting smoking as well as initiation of his statin and beta-blocker as well.  DISCHARGE LABS:  WBC 15.6, hemoglobin 13.8, hematocrit 41.5, platelet count 247.  Sodium 136, potassium 4.3, chloride 104, CO2 of 26, glucose 106, BUN 14, creatinine 0.87.  LFTs are within normal limits on July 05, 2010.  A1c is 5.7.  Cardiac enzymes negative x4.  Total cholesterol 249, triglycerides 186, HDL 46, LDL 166.  TSH 1.562.  STUDIES: 1. Chest x-ray, July 05, 2010 showed probable COPD with mild central     interstitial prominence which may be chronic. 2. Cardiac catheterization, July 06, 2010, please see full report for     details as well as HPI for summary.  DISCHARGE MEDICATIONS: 1. Aspirin 81 mg daily. 2. Coreg 3.125 mg b.i.d. 3. Nitroglycerin sublingual 0.4 mg every 5 minutes as needed up to 3     doses. 4. Crestor 40 mg nightly. 5. Flexeril 5  mg daily as needed. 6. Flonase nasal spray 1-2 sprays nasally daily as needed for nasal     congestion. 7. Zyrtec OTC 1 tablet by mouth daily as needed.  DISPOSITION:  Mr. Kreuser will be discharged in stable condition to home. He is not to lift anything over 5 pounds for 1 week and is not to drive for 2 days.  He is to follow a low-sodium heart-healthy diet and strongly encouraged to quit smoking.  He is to call or return if he notices any pain, swelling, bleeding or pus in his cath site.  He is instructed to follow up with his PCP in several weeks with the option of having his PCP or Dr. Elease Hashimoto follow his lipid panel, which will need to be rechecked along with LFTs in 4-6 weeks given the  initiation of statin.  He will follow by Dr. Elease Hashimoto on Aug 10, 2010 at 10:45 a.m.  DURATION OF DISCHARGE ENCOUNTER:  Greater than 30 minutes including physician and PA time.     [name], P.A.C.   ______________________________ [name], M.D.    DD/MEDQ  D:  07/06/2010  T:  07/07/2010  Job:  [id number]  cc:   Marne A. Milinda Antis, MD  Electronically Signed by Kristeen Miss M.D. on 07/11/2010 09:10:18 AM Electronically Signed by [name]  on 07/11/2010 12:23:30 PM

## 2010-07-11 NOTE — Procedures (Signed)
  NAMEHASHIR, DELEEUW NO.:  [id number]  MEDICAL RECORD NO.:  [id number]           PATIENT TYPE:  LOCATION:                                 FACILITY:  PHYSICIAN:  [name], M.D. DATE OF BIRTH:  1961-03-16  DATE OF PROCEDURE:  07/06/2010 DATE OF DISCHARGE:                           CARDIAC CATHETERIZATION   Antonio Russell is a 49 year old gentleman with a history of cigarette smoking, hypertension, and hyperlipidemia.  He presented to the emergency room with episodes of rest chest pain yesterday.  It was relieved with nitroglycerin.  We referred him for cardiac catheterization for further evaluation.  The procedure was left heart catheterization with coronary angiography.  The right femoral artery was easily cannulated using the modified Seldinger technique.  HEMODYNAMIC RESULTS:  LV pressure is 98/10, the aortic pressure is 98/66.  ANGIOGRAPHY: 1. Left main:  The left main is extremely short.  The left main     trifurcates into a LAD, left circumflex artery, and a small ramus     intermediate vessel. 2. Left anterior descending artery:  The LAD is a moderate-to-large     vessel.  There is mild diffuse irregularities between 10% and 20%     throughout the entire LAD.  The first diagonal artery has mild     disease. 3. Left circumflex artery:  The left circumflex artery provides flow     to a first obtuse marginal artery distribution.  There is a 40% to     50% mid stenosis.  There was brisk flow down the vessel and the mid     stenosis does not appear to be flow obstructive. 4. Ramus intermediate vessel:  This is a small to moderate-sized     vessel.  There are minor luminal irregularities. 5. Right coronary artery:  The right coronary artery is large and     dominant.  There is a proximal 20% to 30% stenosis followed by     diffuse 20% to 30% stenosis.  The posterior descending artery has     mild irregularities. 6. Left ventriculogram:  Left  ventriculogram was performed in the 30     RAO position.  It reveals normal left ventricular systolic function     with an ejection fraction of 65%.  There are no segmental wall     motion abnormalities.  COMPLICATIONS:  None.  CONCLUSION:  Mild coronary artery disease.  The patient needs to stop smoking.  He needs aggressive cholesterol management.  We will continue with the Crestor.  He needs to watch his diet.  He needs to exercise on a more regular basis.  I anticipate discharge to home today.  He can follow up with his primary medical doctor, Dr. Milinda Antis.  He can see [place] as needed.     [name], M.D.     PJN/MEDQ  D:  07/06/2010  T:  07/07/2010  Job:  [id number]  cc:   Marne A. Milinda Antis, MD  Electronically Signed by Kristeen Miss M.D. on 07/11/2010 09:10:22 AM

## 2010-07-13 ENCOUNTER — Encounter: Payer: Self-pay | Admitting: Family Medicine

## 2010-07-13 ENCOUNTER — Ambulatory Visit (INDEPENDENT_AMBULATORY_CARE_PROVIDER_SITE_OTHER): Payer: Commercial Indemnity | Admitting: Family Medicine

## 2010-07-13 VITALS — BP 140/84 | HR 76 | Temp 99.0°F | Ht 73.0 in | Wt 183.0 lb

## 2010-07-13 DIAGNOSIS — I2510 Atherosclerotic heart disease of native coronary artery without angina pectoris: Secondary | ICD-10-CM

## 2010-07-13 DIAGNOSIS — R21 Rash and other nonspecific skin eruption: Secondary | ICD-10-CM | POA: Insufficient documentation

## 2010-07-13 DIAGNOSIS — F17200 Nicotine dependence, unspecified, uncomplicated: Secondary | ICD-10-CM

## 2010-07-13 DIAGNOSIS — E785 Hyperlipidemia, unspecified: Secondary | ICD-10-CM

## 2010-07-13 DIAGNOSIS — I10 Essential (primary) hypertension: Secondary | ICD-10-CM

## 2010-07-13 MED ORDER — CARVEDILOL 6.25 MG PO TABS: 6.2500 mg | ORAL_TABLET | Freq: Two times a day (BID) | ORAL | Status: DC

## 2010-07-13 MED ORDER — SIMVASTATIN 20 MG PO TABS: 20.0000 mg | ORAL_TABLET | Freq: Every day | ORAL | Status: DC

## 2010-07-13 NOTE — Assessment & Plan Note (Signed)
Started on coreg in hospital.  bp slightly elevated at 140/84 today. Discussed option of adding ACEI, pt prefers to increase current med first.  Discussed possible side effect of worsening fatigue, pt to monitor and let us know.

## 2010-07-13 NOTE — Assessment & Plan Note (Signed)
See CAD plan

## 2010-07-13 NOTE — Patient Instructions (Addendum)
Return to see myself or Dr. Milinda Antis in 2 months (after cardiologist appointment) Keep appointment with cardiologist. Good job with cutting back on smoking.  Keep up the good work. Increase coreg to 2 pills in am and 2 in evening.  New prescription will be 1 pill of 6.25mg  dose twice daily. Change pravastatin to simvastatin 20mg  daily.  Update [place] if any muscle aches. Good to see you today, call us with questions.

## 2010-07-13 NOTE — Progress Notes (Signed)
  Subjective:    Patient ID: Antonio Russell, male    DOB: [birth year], 50 y.o.   MRN: [id number]  HPI CC: hosp f/u  Admitted 4/4 to 07/06/2010 with chest pain, found to have nonobstructive CAD by cath 07/06/2010.  hosp records reviewed.  Scheduled to f/u with Dr. Elease Hashimoto in May.    HLD - previously on lipitor, myalgias, previously on crestor, myalgias.  Now taking pravastatin 20mg  daily.  Trying to watch diet.  HTN - staying 140sbp.  No HA, vision changes, SOB, leg swelling  Smoking down to 1/2 ppd, bought electronic cigarette.  Feels doing ok with 1/2 ppd.  Family is helping him quit.  Previously tried patch, gum and wellbutrin.  New skin rash - R groin/thigh anterior started at site of femoral access.  Since discharge, no CP, tightness, SOB.  Has been at home.  Works making cigarette filters.  Recent scare in family - daughter with possible lupus dx currently undergoing w/u.  Notes fatigue ongoing for last several months, increased stress at work 12 hour days, on feet.  Cleared to return to work Monday.  Medications and allergies reviewed and updated as above. PMHx, SHx reviewed and updated in chart.  Review of Systems Per HPI    Objective:   Physical Exam  Constitutional: He appears well-developed and well-nourished. No distress.  HENT:  Head: Normocephalic and atraumatic.  Mouth/Throat: Oropharynx is clear and moist. No oropharyngeal exudate.  Eyes: Conjunctivae and EOM are normal. Pupils are equal, round, and reactive to light. No scleral icterus.  Neck: Normal range of motion. Neck supple. No thyromegaly present.  Cardiovascular: Normal rate, regular rhythm, normal heart sounds and intact distal pulses.   No murmur heard. Pulmonary/Chest: Effort normal and breath sounds normal. No respiratory distress. He has no wheezes. He has no rales.  Lymphadenopathy:    He has no cervical adenopathy.  Skin: Rash (R groin/anterior thigh with fine papular rash, erythematous, not pruritic) noted.           Assessment & Plan:

## 2010-07-13 NOTE — Assessment & Plan Note (Signed)
Encouraged cessation.  Down to 1/2 ppd.

## 2010-07-13 NOTE — Assessment & Plan Note (Signed)
Along area of previous femoral access for catheterization.  Anticipate allergic reaction to adhesive.  Recommend continue to monitor for now, anticipate slow resolution.  If not the case, to let us know.

## 2010-07-13 NOTE — Assessment & Plan Note (Signed)
Recently found, discussed goal of LDL< 100, ideally <70.   Self stopped crestor as caused myalgias in past.  Discussed how pravastatin will not achieve goal LDL reduction. Start simvastatin at 20mg .  If starting with myalgias, to call us and consider starting co enzyme Q supplement. Alternatively consider QOD dosing of crestor.

## 2010-07-17 NOTE — H&P (Signed)
Antonio Russell, PORTMAN NO.:  [id number]  MEDICAL RECORD NO.:  [id number]           PATIENT TYPE:  I  LOCATION:  2915                         FACILITY:  [facility]  PHYSICIAN:  [name], M.D. DATE OF BIRTH:  08-18-1960  DATE OF ADMISSION:  07/05/2010 DATE OF DISCHARGE:                             HISTORY & PHYSICAL   PRIMARY CARE PHYSICIAN:  Marne A. Tower, MD  PRIMARY CARDIOLOGIST:  None.  CHIEF COMPLAINT:  Chest pain.  HISTORY OF PRESENT ILLNESS:  Mr. Straw is a 50 year old male with no previous history of coronary artery disease.  He was at work this a.m. at about 6:15 and had sudden onset of substernal chest pain without significant exertion.  It reached a 7/10 and was associated with some nausea, mild shortness of breath, and slight diaphoresis.  When his symptoms did not resolve, he called his wife and she brought him to the emergency room.  In the emergency room, his pain was still a 7/10, so he received aspirin 81 mg x4 and sublingual nitroglycerin x1.  His pain decreased to a 3/10, but had since increased back up to 4/10.  His associated symptoms have resolved.  He states it might be worse with deep inspiration, but there is no change with position or cough.  He has never had symptoms like this before.  Several days ago, he was mowing and using a weed eater without symptoms, but has not recently done any strenuous activity.  He has no history of exertional chest pain. Currently, he appears only mildly uncomfortable.  PAST MEDICAL HISTORY: 1. Ongoing tobacco use. 2. Borderline hypertension. 3. Hyperlipidemia. 4. History of nephrolithiasis. 5. Anxiety. 6. ED. 7. Seasonal allergies.  SURGICAL HISTORY:  He is status post colonoscopy and hemorrhoid removal as well as nasal surgery and uvulectomy.  ALLERGIES:  He did not tolerate ZOLOFT in the past, but this was secondary to side effects when he discontinued it suddenly.  CURRENT  MEDICATIONS: 1. Claritin 10 mg daily p.r.n. 2. Crestor 20 mg a day. 3. Cialis p.r.n. 4. Nexium 40 mg daily.  SOCIAL HISTORY:  He lives in [place] with his wife and 6 children. He works at [employer].  He has a greater than 30 pack-year history of ongoing tobacco use, but denies alcohol and drug abuse.  He is active around the house and yard, but does not exercise regularly.  FAMILY HISTORY:  He has very little information on his father, but knows that he is alive and is not aware of any coronary artery disease.  His mother is alive and well and neither his mother nor any siblings have cardiac issues.  REVIEW OF SYSTEMS:  He has had some cough recently, which is dry and may also be having some problems with seasonal allergies.  His wife also believes that he has chronic smoker's cough.  He has some situational anxiety because of an increased work load, routinely working 60 hours a week recently.  His daughter also has some health problems.  He has occasional arthralgias and back pain.  He feels his reflux symptoms are well-controlled  on Nexium and denies any melena, hematemesis, or hematuria.  Full 14-point review of systems is otherwise negative except as stated in the HPI.  PHYSICAL EXAMINATION:  VITAL SIGNS:  Temperature is 98.4, blood pressure 151/98, pulse 77, respiratory rate 16, O2 saturation 98% on room air. GENERAL:  He is a well-developed, well-nourished white male in mild distress. HEENT:  Normal. NECK:  There is no lymphadenopathy, thyromegaly, bruit, or JVD noted. CV:  His heart is regular rate and rhythm with an S1 and S2 and no significant murmur, rub, or gallop is noted.  Distal pulses are intact in all four extremities. LUNGS:  Clear to auscultation bilaterally. SKIN:  No rashes or lesions are noted. ABDOMEN:  Soft and nontender with active bowel sounds. EXTREMITIES:  There is no cyanosis, clubbing, or edema noted. MUSCULOSKELETAL:   There is no joint deformity or effusions and no spine or CVA tenderness. NEURO:  He is alert and oriented with cranial nerves II through XII grossly intact.  Chest x-ray shows probable COPD with mild central interstitial prominence that may be chronic.  EKG is sinus rhythm with an incomplete right bundle-branch block and no acute ischemic changes.  LABORATORY VALUES:  Hemoglobin 15.3, hematocrit 44.5, WBC is 11.5, platelets 262.  Sodium 136, potassium 4.3, chloride 104, CO2 of 26, BUN 14, creatinine 0.87, glucose 107.  Point-of-care markers negative x1.  IMPRESSION:  Mr. Adriano was seen today by Dr. Elease Hashimoto, the patient evaluated and the data reviewed.  He is a 50 year old male with a history of smoking, hypertension, and hyperlipidemia who had sudden onset of chest pressure at rest.  It is better with nitroglycerin.  His EKG does not have acute ischemic changes.  His initial enzymes are negative.  PLAN:  He will be admitted and started on IV nitroglycerin and continued on aspirin.  We will recheck his cardiac enzymes.  If they are elevated, he will be cathed today.  He may need a cardiac catheterization and atthe least we will do a Myoview while he is in the hospital.  He can have clear liquids for now and then n.p.o. for possible procedure later today.  We will continue to follow him closely.     Theodore Demark, PA-C   ______________________________ [name], M.D.    RB/MEDQ  D:  07/05/2010  T:  07/06/2010  Job:  [id number]  cc:   Marne A. Milinda Antis, MD  Electronically Signed by Theodore Demark PA-C on 07/15/2010 03:37:41 PM Electronically Signed by Kristeen Miss M.D. on 07/17/2010 03:13:50 PM

## 2010-07-18 ENCOUNTER — Ambulatory Visit
Admission: RE | Admit: 2010-07-18 | Discharge: 2010-07-18 | Disposition: A | Discharge status: Home / Self Care | Payer: Managed Care, Other (non HMO) | Source: Ambulatory Visit | Attending: Internal Medicine | Admitting: Internal Medicine

## 2010-07-18 ENCOUNTER — Other Ambulatory Visit: Payer: Self-pay | Admitting: Internal Medicine

## 2010-07-18 DIAGNOSIS — M545 Low back pain: Secondary | ICD-10-CM

## 2010-08-10 ENCOUNTER — Ambulatory Visit (INDEPENDENT_AMBULATORY_CARE_PROVIDER_SITE_OTHER): Payer: Managed Care, Other (non HMO) | Admitting: Cardiovascular Disease

## 2010-08-10 ENCOUNTER — Encounter: Payer: Self-pay | Admitting: Cardiovascular Disease

## 2010-08-10 VITALS — BP 132/90 | HR 90 | Ht 73.0 in | Wt 182.4 lb

## 2010-08-10 DIAGNOSIS — I2510 Atherosclerotic heart disease of native coronary artery without angina pectoris: Secondary | ICD-10-CM

## 2010-08-10 DIAGNOSIS — R000 Tachycardia, unspecified: Secondary | ICD-10-CM | POA: Insufficient documentation

## 2010-08-10 DIAGNOSIS — M549 Dorsalgia, unspecified: Secondary | ICD-10-CM | POA: Insufficient documentation

## 2010-08-10 DIAGNOSIS — M1990 Unspecified osteoarthritis, unspecified site: Secondary | ICD-10-CM | POA: Insufficient documentation

## 2010-08-10 NOTE — Assessment & Plan Note (Signed)
Better on Coreg.  We may need to increase his dose.

## 2010-08-10 NOTE — Progress Notes (Signed)
Antonio Russell [facility] Date of Birth  July 03, 1960 [facility] [address]     [address] [place], [place]  [address] [phone]  Fax  [phone]  History of Present Illness:  Pt was recently hospitalized for chest pain.  Cath revealed minor coronary irreg.  No significant cp since discharge.  He was tachycardic at the hospital and was started on low dose Coreg.  This was recently increased by Dr. Milinda Antis.    Hx of hypercholesterolemia - did not tolerate Crestor or Lipator.  We discharged him home or Pravachol and this was changed to Simvastatin by primary MD.  He seems to be tolerating the simvastatin so far.  Current Outpatient Prescriptions on File Prior to Visit  Medication Sig Dispense Refill  . aspirin 81 MG tablet Take 81 mg by mouth daily.        . carvedilol (COREG) 6.25 MG tablet Take 1 tablet (6.25 mg total) by mouth 2 (two) times daily with a meal.  60 tablet  3  . cyclobenzaprine (FLEXERIL) 5 MG tablet Take 5 mg by mouth 3 (three) times daily as needed.        . fluticasone (FLONASE) 50 MCG/ACT nasal spray 2 sprays by Nasal route daily as needed.        . nitroGLYCERIN (NITROSTAT) 0.4 MG SL tablet Place 0.4 mg under the tongue every 5 (five) minutes as needed. May repeat up to 3 times       . simvastatin (ZOCOR) 20 MG tablet Take 1 tablet (20 mg total) by mouth at bedtime.  30 tablet  11  . tadalafil (CIALIS) 10 MG tablet Take 10 mg by mouth as directed.        [name] DISCONTD: cetirizine (ZYRTEC) 10 MG tablet Take 10 mg by mouth daily.        [name] DISCONTD: loratadine (CLARITIN) 10 MG tablet Take 10 mg by mouth daily.          Allergies  Allergen Reactions  . Sertraline Hcl     REACTION: muscle twitches    Past Medical History  Diagnosis Date  . Impotence of organic origin   . HLD (hyperlipidemia)   . Other testicular hypofunction   . Unspecified hearing loss   . Personal history of urinary calculi   . Tobacco use disorder   . Headache     . Anxiety   . Allergic rhinitis   . CAD (coronary artery disease) 2012    cardiac cath - nonobstructive, nl LV fxn, rec aggressive med management  . Back pain   . Arthritis     Past Surgical History  Procedure Date  . Colonoscopy   . Hemorrhoid surgery   . Nasal septum surgery     History  Smoking status  . Current Everyday Smoker -- 0.5 packs/day  . Types: Cigarettes  Smokeless tobacco  . Not on file  Comment: just recently decreased from 1 PPD-purchased electronic cigarette    History  Alcohol Use  . Yes    6 pack 3 times weekly    Family History  Problem Relation Age of Onset  . Lung cancer      GF  . Heart disease      GM  . Hypertension Mother   . Hyperlipidemia Mother   . Diabetes Mother   . Hypertension Father   . Hyperlipidemia Father   . Hypertension Sister     Reviw of Systems:  Reviewed in the HPI.  All other systems are negative.  Physical Exam: BP 132/90  Pulse 90  Ht 6\' 1"  (1.854 m)  Wt 182 lb 6.4 oz (82.736 kg)  BMI 24.06 kg/m2 The patient is alert and oriented x 3.  The mood and affect are normal.  The skin is warm and dry.  Color is normal.  The HEENT exam reveals that the sclera are nonicteric.  The mucous membranes are moist.  The carotids are 2+ without bruits.  There is no thyromegaly.  There is no JVD.  The lungs are clear.  The chest wall is non tender.  The heart exam reveals a regular rate with a normal S1 and S2.  There are no murmurs, gallops, or rubs.  The PMI is not displaced.   Abdominal exam reveals good bowel sounds.  There is no guarding or rebound.  There is no hepatosplenomegaly or tenderness.  There are no masses.  Exam of the legs reveal no clubbing, cyanosis, or edema.  The legs are without rashes.  The distal pulses are intact.  Cranial nerves II - XII are intact.  Motor and sensory functions are intact.  The gait is normal.  ECG:  Assessment / Plan:

## 2010-08-10 NOTE — Assessment & Plan Note (Signed)
He has very minimal CAD but enough to warrant risk factor modification.  Have encouraged him to stop smoking and to continue to work on lipids.  Will see him back on an as needed basis.

## 2010-08-18 NOTE — Op Note (Signed)
[place]. [facility]  Patient:    Antonio Russell, Antonio Russell                          MRN: [id number] Proc. Date: 10/31/00 Attending:  Kinnie Scales. Annalee Genta, M.D.                           Operative Report  PREOPERATIVE DIAGNOSIS: 1. Deviated nasal septum with nasal obstruction. 2. Mild obstructive sleep apnea. 3. Inferior turbinate hypertrophy. 4. Uvulopalatal hypertrophy.  POSTOPERATIVE DIAGNOSIS: 1. Deviated nasal septum with nasal obstruction. 2. Mild obstructive sleep apnea. 3. Inferior turbinate hypertrophy. 4. Uvulopalatal hypertrophy.  OPERATION PERFORMED: 1. Nasal septoplasty. 2. Bilateral inferior turbinate intramural cautery. 3. Uvulectomy.  SURGEON:  Kinnie Scales. Annalee Genta, M.D.  ANESTHESIA:  General endotracheal.  COMPLICATIONS:  None.  ESTIMATED BLOOD LOSS:  Approximately 50 cc.  Patient transferred from the operating room to the recovery room in stable condition.  INDICATIONS FOR PROCEDURE:  The patient is a 50 year old white male who was referred by his pulmonologist, Dr. Marcelyn Bruins for evaluation of mild obstructive sleep apnea.  Inpatient sleep study was performed and the patient was found to have respiratory disturbance index of 15 events per hour with an O2 nadir in the mid-80s.  The patient had significant symptoms of fatigue and hypersomnolence.  He was titrated on CPAP, but was unable to tolerate the CPAP mask secondary to nasal obstruction.  Evaluation preoperatively including diagnostic nasal endoscopy revealed a severely deviated nasal septum with septal spurring and obstruction of the nasal cavity primarily on the left side, bilateral inferior turbinate hypertrophy and moderate enlargement of the palate and uvula.  Given the patients history, examination and findings, I recommended that we undertake the above surgical procedures as primary management of nasal obstruction and mild obstructive sleep apnea.  The risks, benefits and  possible complications of each of these procedures were discussed in detail with the patient and his wife, who understood and concurred with our plan for surgery which was scheduled as outlined above.  DESCRIPTION OF PROCEDURE:  The patient was brought to the operating room on October 31, 2000 and placed in supine on the operating table.  General endotracheal anesthesia was established without difficulty.  When the patient was adequately anesthetized, his nose was injected with 14 cc of 1% lidocaine 1:100,000 solution epinephrine which was injected in a submucosal fashion along the nasal septum and inferior turbinates bilaterally.  The patients nose was then packed with Afrin soaked cottonoid pledgets which were left in place for approximately 10 minutes to allow for vasoconstriction.  The surgical procedure was begun.  The patient was prepped and draped in sterile fashion.  A Crowe-Davis mouth gag was inserted without difficulty and the oral cavity and the oropharynx were examined.  The patient was found to have 1+ tonsils and moderate hypertrophy of the palate, uvula and posterior tonsillar pillars.  A uvulectomy was then performed using a Bovie electrocautery dissecting across the uvular attachment to the margin of the soft palate.  The entire uvula was removed.  The dissection was then extended laterally along the posterior tonsillar pillars to the level of the anterior aspect of the tonsillar fossa.  There was no active bleeding.  The patients oral cavity and oropharynx were irrigated and suctioned.  The Crowe-Davis mouth gag was released and removed and there were no loose or broken teeth.  Attention was then turned to the  patients nasal cavity and nasal packing was removed.  The patients nose was examined and he was found to have a severely deviated septum with septal spurring and obstruction of the left nasal cavity. A right hemitransfixion incision was created and this was carried  through the mucosa and underlying submucosa and a mucoperichondrial flap was elevated from anterior to posterior.  Bony cartilaginous junction was crossed at the midline and the mucoperiosteal flap was elevated on the patients left with exposure of the deviation in the mid and posterior aspects of the nasal septum.  A 4 mm osteotome was used to resect the bony septal spur and deviated septum.  This was removed without difficulty.  There was a small tear in the mucosa which was repaired at the conclusion of the procedure.  The midcartilaginous septum was also badly deviated and this was resected.  The anterior columellar and dorsal aspects of the septal cartilage were left intact as these were not deviated or causing obstruction.  The mucoperichondrial flaps were then reapproximated with a 4-0 gut suture on a Keith needle in a horizontal mattressing fashion.  Hemitransfixion incision was closed with the same stitch.  Bilateral Doyle nasal septal splints were then placed and sutured into position with a 3-0 Ethilon suture after the application of Bactroban ointment.  Bilateral inferior turbinate intramural cautery was then performed with a cautery set at 14 watts.  Two passes were made in a submucosal fashion in each inferior turbinate.  When the turbinates had been adequately cauterized, they were outfractured to create a more patent nasal cavity. The patients nasal cavity was then irrigated and suctioned.  The oral cavity was suctioned and an orogastric tube was passed.  The stomach contents were aspirated.  The patient was then awakened from his anesthetic.  He was extubated and was then transferred from the operating room to the recovery room in stable condition. There were no complications and estimated blood loss was approximately 50 cc. D:  10/31/00 TD:  10/31/00 Job: [id number]

## 2010-12-31 ENCOUNTER — Emergency Department (HOSPITAL_COMMUNITY)
Admission: EM | Admit: 2010-12-31 | Discharge: 2010-12-31 | Disposition: A | Discharge status: Home / Self Care | Payer: Managed Care, Other (non HMO) | Attending: Emergency Medicine | Admitting: Emergency Medicine

## 2010-12-31 DIAGNOSIS — E785 Hyperlipidemia, unspecified: Secondary | ICD-10-CM | POA: Insufficient documentation

## 2010-12-31 DIAGNOSIS — M79609 Pain in unspecified limb: Secondary | ICD-10-CM | POA: Insufficient documentation

## 2010-12-31 DIAGNOSIS — K219 Gastro-esophageal reflux disease without esophagitis: Secondary | ICD-10-CM | POA: Insufficient documentation

## 2011-01-08 ENCOUNTER — Encounter: Payer: Self-pay | Admitting: Family Medicine

## 2011-01-08 ENCOUNTER — Ambulatory Visit (INDEPENDENT_AMBULATORY_CARE_PROVIDER_SITE_OTHER)
Admission: RE | Admit: 2011-01-08 | Discharge: 2011-01-08 | Disposition: A | Discharge status: Home / Self Care | Payer: Managed Care, Other (non HMO) | Source: Ambulatory Visit | Attending: Family Medicine | Admitting: Family Medicine

## 2011-01-08 ENCOUNTER — Ambulatory Visit (INDEPENDENT_AMBULATORY_CARE_PROVIDER_SITE_OTHER): Payer: Managed Care, Other (non HMO) | Admitting: Family Medicine

## 2011-01-08 VITALS — BP 148/84 | HR 84 | Temp 98.1°F | Ht 73.0 in | Wt 189.0 lb

## 2011-01-08 DIAGNOSIS — M79609 Pain in unspecified limb: Secondary | ICD-10-CM

## 2011-01-08 DIAGNOSIS — F17200 Nicotine dependence, unspecified, uncomplicated: Secondary | ICD-10-CM

## 2011-01-08 DIAGNOSIS — M79604 Pain in right leg: Secondary | ICD-10-CM

## 2011-01-08 DIAGNOSIS — E785 Hyperlipidemia, unspecified: Secondary | ICD-10-CM

## 2011-01-08 NOTE — Assessment & Plan Note (Signed)
Disc in detail risks of smoking and possible outcomes including copd, vascular/ heart disease, cancer , respiratory and sinus infections  Pt voices understanding  He is cutting down but not yet ready to quit  Understands that this also inc risk of all vascular dz

## 2011-01-08 NOTE — Assessment & Plan Note (Signed)
Off any med at this time (while investigating leg pain )  May return him to pravastatin in future - will disc at 1 mo f/u  Has non obst CAD  Enc to stop smoking

## 2011-01-08 NOTE — Patient Instructions (Signed)
Xray of leg today- I will update you with a result  Keep working on quitting smoking  Use warm compress on leg if you need to  Follow up with me in 1 month to discuss cholesterol treatment

## 2011-01-08 NOTE — Assessment & Plan Note (Signed)
R shin pain - in spasms either at rest or during activity  Nl exam today X ray  Is at risk for pvd due to smoking  No injuries No red flags for dvt with hx or exam

## 2011-01-08 NOTE — Progress Notes (Signed)
Subjective:    Patient ID: Antonio Russell, male    DOB: 21-Mar-1961, 50 y.o.   MRN: [id number]  HPI Here for hosp f/u for leg pain  Went to [place] for severe R anterior leg pain on 9/30 This has happened several times  7-8 weeks ago - went to wash his leg in the shower and was extremely tender- that lasted about a week   Then happened again -- this last Sunday am -- felt like something hit it - now to the touch is much improved  Hurt so bad in spasms -- severe   Last time - happened one after another after another - starting over a weekend - now none since Saturday Now his leg feels funny  No swelling/ trauma/ no bruising No new exercise  Did not any work up in the ER   Today is a good day- not tender Hurts a bit to walk on it   No pain in calf and no lumps   Low back will act up occasionally - but was not hurting when he was having this   Still smoking - has cut down to 1/2-1ppd   Had to stop his zocor due to muscle pain  Has CAD now - sees cardiologist  Was told he could stop the coreg -so he did  Told he could see cardiol prn   Patient Active Problem List  Diagnoses  . HYPOGONADISM  . HYPERLIPIDEMIA  . TOBACCO USE  . LOSS, HEARING NOS  . ERECTILE DYSFUNCTION, ORGANIC  . RENAL CALCULUS, HX OF  . CAD (coronary artery disease)  . Unspecified essential hypertension  . Skin rash  . Back pain  . Arthritis  . Tachycardia  . Right leg pain   Past Medical History  Diagnosis Date  . Impotence of organic origin   . HLD (hyperlipidemia)   . Other testicular hypofunction   . Unspecified hearing loss   . Personal history of urinary calculi   . Tobacco use disorder   . Headache   . Anxiety   . Allergic rhinitis   . Chest pain 2012    cardiac cath - nonobstructive, nl LV fxn, rec aggressive med management  . Back pain   . Arthritis    Past Surgical History  Procedure Date  . Colonoscopy   . Hemorrhoid surgery   . Nasal septum surgery    History  Substance  Use Topics  . Smoking status: Current Everyday Smoker -- 0.5 packs/day    Types: Cigarettes  . Smokeless tobacco: Not on file   Comment: just recently decreased from 1 PPD-purchased electronic cigarette  . Alcohol Use: Yes     6 pack 3 times weekly   Family History  Problem Relation Age of Onset  . Lung cancer      GF  . Heart disease      GM  . Hypertension Mother   . Hyperlipidemia Mother   . Diabetes Mother   . Hypertension Father   . Hyperlipidemia Father   . Hypertension Sister    Allergies  Allergen Reactions  . Crestor (Rosuvastatin Calcium)     Muscle aches  . Lipitor (Atorvastatin Calcium)     Arm soreness   . Sertraline Hcl     REACTION: muscle twitches  . Zocor (Simvastatin - High Dose)     Muscle pain    Current Outpatient Prescriptions on File Prior to Visit  Medication Sig Dispense Refill  . aspirin 81 MG tablet Take  81 mg by mouth daily.        . fluticasone (FLONASE) 50 MCG/ACT nasal spray 2 sprays by Nasal route daily as needed.        . cyclobenzaprine (FLEXERIL) 5 MG tablet Take 5 mg by mouth 3 (three) times daily as needed.        . nitroGLYCERIN (NITROSTAT) 0.4 MG SL tablet Place 0.4 mg under the tongue every 5 (five) minutes as needed. May repeat up to 3 times       . tadalafil (CIALIS) 10 MG tablet Take 10 mg by mouth as directed.              Review of Systems Review of Systems  Constitutional: Negative for fever, appetite change, fatigue and unexpected weight change.  Eyes: Negative for pain and visual disturbance.  Respiratory: Negative for cough and shortness of breath.   Cardiovascular: Negative for cp or palpitations    Gastrointestinal: Negative for nausea, diarrhea and constipation.  Genitourinary: Negative for urgency and frequency.  Skin: Negative for pallor or rash   MSK pos for anterior R leg pain, no back pain, no joint changes  Neurological: Negative for weakness, light-headedness, numbness and headaches.  Hematological:  Negative for adenopathy. Does not bruise/bleed easily.  Psychiatric/Behavioral: Negative for dysphoric mood. The patient is not nervous/anxious.          Objective:   Physical Exam  Constitutional: He appears well-developed and well-nourished.  HENT:  Head: Normocephalic and atraumatic.  Mouth/Throat: Oropharynx is clear and moist.  Eyes: Conjunctivae and EOM are normal. Pupils are equal, round, and reactive to light.  Neck: Normal range of motion. Neck supple. No JVD present. Carotid bruit is not present. No thyromegaly present.  Cardiovascular: Normal rate, regular rhythm, normal heart sounds and intact distal pulses.        DP pulse in L foot is slightly stronger than in R Both feet are  Equally warm   Pulmonary/Chest: Effort normal and breath sounds normal. No respiratory distress. He has no wheezes.       Diffusely distant bs   Abdominal: Soft. Bowel sounds are normal. He exhibits no distension and no mass. There is no tenderness.  Musculoskeletal: Normal range of motion. He exhibits tenderness. He exhibits no edema.       Very mild tenderness over distal shin - no swelling or skin changes , few varicosities    Lymphadenopathy:    He has no cervical adenopathy.  Neurological: He is alert. He has normal strength and normal reflexes. He displays no atrophy. No cranial nerve deficit or sensory deficit. He exhibits normal muscle tone. Coordination normal.  Skin: Skin is warm and dry. No rash noted. No erythema. No pallor.  Psychiatric: He has a normal mood and affect.          Assessment & Plan:

## 2011-01-10 ENCOUNTER — Telehealth: Payer: Self-pay | Admitting: *Deleted

## 2011-01-10 DIAGNOSIS — M79604 Pain in right leg: Secondary | ICD-10-CM

## 2011-01-10 DIAGNOSIS — F17200 Nicotine dependence, unspecified, uncomplicated: Secondary | ICD-10-CM

## 2011-01-10 NOTE — Telephone Encounter (Signed)
Advised pt of x-ray results, he said he didn't get a message. He is agreeable to having ABI's done, prefers to go to [facility].

## 2011-01-10 NOTE — Telephone Encounter (Signed)
Will order ABIs

## 2011-01-11 ENCOUNTER — Telehealth: Payer: Self-pay | Admitting: *Deleted

## 2011-01-11 DIAGNOSIS — M79604 Pain in right leg: Secondary | ICD-10-CM

## 2011-01-11 NOTE — Telephone Encounter (Signed)
Dr Milinda Antis, Cardiology says that you put an order in for an ABI only in Epic. You need to cancel that order and please reorder a Lower Extremity Arterial Duplex and please free text with ABI's in the order. Insurance cos will not pay for a ABI only. Patient is already scheduled, they just need the correct order before 02/08/2011.

## 2011-01-11 NOTE — Telephone Encounter (Signed)
Dr. Milinda Antis had ordered ABI's on the patient and Heart care is asking for new order for new order.   They said there are only a few doctors that have clearance to order these.  They said order needs to read "lower arterial duplex, with ABI".  Please send back in.

## 2011-01-13 NOTE — Telephone Encounter (Signed)
I put in the order - but cannot find away to cancel the first order because encounter is closed

## 2011-01-15 NOTE — Telephone Encounter (Signed)
Addended by: Roxy Manns A on: 01/15/2011 05:30 PM   Modules accepted: Orders

## 2011-02-08 ENCOUNTER — Encounter (INDEPENDENT_AMBULATORY_CARE_PROVIDER_SITE_OTHER): Payer: Managed Care, Other (non HMO) | Admitting: Cardiology

## 2011-02-08 DIAGNOSIS — F17200 Nicotine dependence, unspecified, uncomplicated: Secondary | ICD-10-CM

## 2011-02-08 DIAGNOSIS — M79604 Pain in right leg: Secondary | ICD-10-CM

## 2011-02-08 DIAGNOSIS — I70229 Atherosclerosis of native arteries of extremities with rest pain, unspecified extremity: Secondary | ICD-10-CM

## 2011-02-12 ENCOUNTER — Ambulatory Visit: Payer: Managed Care, Other (non HMO) | Admitting: Family Medicine

## 2011-02-16 ENCOUNTER — Encounter: Payer: Self-pay | Admitting: Family Medicine

## 2011-02-16 ENCOUNTER — Ambulatory Visit (INDEPENDENT_AMBULATORY_CARE_PROVIDER_SITE_OTHER): Payer: Managed Care, Other (non HMO) | Admitting: Family Medicine

## 2011-02-16 ENCOUNTER — Encounter: Payer: Self-pay | Admitting: Gastroenterology

## 2011-02-16 VITALS — BP 120/80 | HR 84 | Temp 97.9°F | Wt 185.0 lb

## 2011-02-16 DIAGNOSIS — M79609 Pain in unspecified limb: Secondary | ICD-10-CM

## 2011-02-16 DIAGNOSIS — M79604 Pain in right leg: Secondary | ICD-10-CM

## 2011-02-16 DIAGNOSIS — E785 Hyperlipidemia, unspecified: Secondary | ICD-10-CM

## 2011-02-16 DIAGNOSIS — F17200 Nicotine dependence, unspecified, uncomplicated: Secondary | ICD-10-CM

## 2011-02-16 DIAGNOSIS — I2510 Atherosclerotic heart disease of native coronary artery without angina pectoris: Secondary | ICD-10-CM

## 2011-02-16 DIAGNOSIS — Z1211 Encounter for screening for malignant neoplasm of colon: Secondary | ICD-10-CM

## 2011-02-16 DIAGNOSIS — M549 Dorsalgia, unspecified: Secondary | ICD-10-CM

## 2011-02-16 MED ORDER — PRAVASTATIN SODIUM 40 MG PO TABS: 40.0000 mg | ORAL_TABLET | Freq: Every evening | ORAL | Status: DC

## 2011-02-16 MED ORDER — SILDENAFIL CITRATE 100 MG PO TABS: 100.0000 mg | ORAL_TABLET | ORAL | Status: DC | PRN

## 2011-02-16 NOTE — Progress Notes (Signed)
Subjective:    Patient ID: Antonio Russell, male    DOB: [birth year], 50 y.o.   MRN: [id number]  HPI Here for f/u of R leg pain and hyperlipidemia and smoking   R leg pain -- x ray tib/fib normal  Also doppler normal - good abi - no vasc problems  Has only had a few of the severe pains  Now more dull and constant and bigger area - at times tender to the touch - but not often Back is doing ok - no changes, very occasionally pain -- and when it happens is usually on the R   Did PT in the past - several times for his back  Liked it in [place] best  The modalities for pain  Still does his exercises - but not every single day   Pain is mostly in lateral shin- now a bit more medial- and not in foot    No tingling or weakness in his leg   Off zocor for muscle pain Has mild CAD and high chol Lab Results  Component Value Date   CHOL  Value: 249        ATP III CLASSIFICATION:  <200     mg/dL   Desirable  829-562  mg/dL   Borderline High  >=130    mg/dL   High       * 11/06/5782   HDL 46 07/05/2010   LDLCALC  Value: 166        Total Cholesterol/HDL:CHD Risk Coronary Heart Disease Risk Table                     Men   Women  1/2 Average Risk   3.4   3.3  Average Risk       5.0   4.4  2 X Average Risk   9.6   7.1  3 X Average Risk  23.4   11.0        Use the calculated Patient Ratio above and the CHD Risk Table to determine the patient's CHD Risk.        ATP III CLASSIFICATION (LDL):  <100     mg/dL   Optimal  696-295  mg/dL   Near or Above                    Optimal  130-159  mg/dL   Borderline  284-132  mg/dL   High  >440     mg/dL   Very High* 1/0/2725   LDLDIRECT 150.5 07/25/2009   TRIG 186* 07/05/2010   CHOLHDL 5.4 07/05/2010   off med for a while now Eats a fairly healthy diet -- overall- with some exceptions  Has failed several statins  May have been ok with pravachol in the past  Ok to try it again   Smoking is about the same  The older he gets the more he thinks about quitting  May  consider chantix in the past   Patient Active Problem List  Diagnoses  . HYPOGONADISM  . HYPERLIPIDEMIA  . TOBACCO USE  . LOSS, HEARING NOS  . ERECTILE DYSFUNCTION, ORGANIC  . RENAL CALCULUS, HX OF  . CAD (coronary artery disease)  . Unspecified essential hypertension  . Skin rash  . Back pain  . Arthritis  . Tachycardia  . Right leg pain  . Special screening for malignant neoplasms, colon   Past Medical History  Diagnosis Date  . Impotence of organic origin   .  HLD (hyperlipidemia)   . Other testicular hypofunction   . Unspecified hearing loss   . Personal history of urinary calculi   . Tobacco use disorder   . Headache   . Anxiety   . Allergic rhinitis   . Chest pain 2012    cardiac cath - nonobstructive, nl LV fxn, rec aggressive med management  . Back pain   . Arthritis    Past Surgical History  Procedure Date  . Colonoscopy   . Hemorrhoid surgery   . Nasal septum surgery    History  Substance Use Topics  . Smoking status: Current Everyday Smoker -- 0.5 packs/day    Types: Cigarettes  . Smokeless tobacco: Not on file   Comment: just recently decreased from 1 PPD-purchased electronic cigarette  . Alcohol Use: Yes     6 pack 3 times weekly   Family History  Problem Relation Age of Onset  . Lung cancer      GF  . Heart disease      GM  . Hypertension Mother   . Hyperlipidemia Mother   . Diabetes Mother   . Hypertension Father   . Hyperlipidemia Father   . Hypertension Sister    Allergies  Allergen Reactions  . Crestor (Rosuvastatin Calcium)     Muscle aches  . Lipitor (Atorvastatin Calcium)     Arm soreness   . Sertraline Hcl     REACTION: muscle twitches  . Zocor (Simvastatin - High Dose)     Muscle pain    Current Outpatient Prescriptions on File Prior to Visit  Medication Sig Dispense Refill  . aspirin 81 MG tablet Take 81 mg by mouth daily.        . cyclobenzaprine (FLEXERIL) 5 MG tablet Take 5 mg by mouth 3 (three) times daily as  needed.        . fluticasone (FLONASE) 50 MCG/ACT nasal spray 2 sprays by Nasal route daily as needed.        . nitroGLYCERIN (NITROSTAT) 0.4 MG SL tablet Place 0.4 mg under the tongue every 5 (five) minutes as needed. May repeat up to 3 times              Review of Systems Review of Systems  Constitutional: Negative for fever, appetite change, fatigue and unexpected weight change.  Eyes: Negative for pain and visual disturbance.  Respiratory: Negative for cough and shortness of breath.   Cardiovascular: Negative for cp or palpitations    Gastrointestinal: Negative for nausea, diarrhea and constipation.  Genitourinary: Negative for urgency and frequency.  Skin: Negative for pallor or rash   MSK pos for leg and back pain, neg for joint swelling  Neurological: Negative for weakness, light-headedness, numbness and headaches.  Hematological: Negative for adenopathy. Does not bruise/bleed easily.  Psychiatric/Behavioral: Negative for dysphoric mood. The patient is not nervous/anxious.          Objective:   Physical Exam  Constitutional: He appears well-developed and well-nourished. No distress.  HENT:  Head: Normocephalic and atraumatic.  Mouth/Throat: Oropharynx is clear and moist.  Eyes: Conjunctivae and EOM are normal. Pupils are equal, round, and reactive to light.  Neck: Normal range of motion. Neck supple. No JVD present. Carotid bruit is not present. No thyromegaly present.  Cardiovascular: Normal rate, regular rhythm and intact distal pulses.  Exam reveals no gallop.   Pulmonary/Chest: Effort normal and breath sounds normal. No respiratory distress. He has no wheezes.       Diffusely distant bs  Abdominal: Soft. Bowel sounds are normal. He exhibits no distension, no abdominal bruit and no mass. There is no tenderness.  Musculoskeletal: Normal range of motion. He exhibits no edema and no tenderness.       Mild tenderness over L4 to S1 vert  R SLR yields some back/ buttock  pain Nl rom hips No calf/ leg tenderness today  Nl gait  Nl rom LS with pain on ext and L lateral bend   Lymphadenopathy:    He has no cervical adenopathy.  Neurological: He is alert. He has normal strength and normal reflexes. He displays no atrophy and no tremor. No cranial nerve deficit or sensory deficit. He exhibits normal muscle tone. Coordination and gait normal.  Skin: Skin is warm and dry. No rash noted. No erythema. No pallor.  Psychiatric: He has a normal mood and affect.          Assessment & Plan:

## 2011-02-16 NOTE — Patient Instructions (Addendum)
We will ref you for colonoscopy at check out  We will ref you for MRI at check out - I will update you with result or plan  Start pravachol - if any problems/ side effects- stop it and let me know  Schedule fasting labs in 6 weeks for pravachol/ cholesterol  Avoid red meat/ fried foods/ egg yolks/ fatty breakfast meats/ butter, cheese and high fat dairy/ and shellfish

## 2011-02-18 NOTE — Assessment & Plan Note (Signed)
Pt requests ref for screen colonosc This was done No stool changes

## 2011-02-18 NOTE — Assessment & Plan Note (Signed)
Intol of several statins but will try pravachol again- is not as effective but tolerated this in the past  40 mg px  Check labs in 6 wk  Disc goals for lipids and reasons to control them Rev labs with pt Rev low sat fat diet in detail

## 2011-02-18 NOTE — Assessment & Plan Note (Signed)
Disc imp of smoking cessation and chol control in detail today No cp or other symptoms

## 2011-02-18 NOTE — Assessment & Plan Note (Signed)
Disc in detail risks of smoking and possible outcomes including copd, vascular/ heart disease, cancer , respiratory and sinus infections  Pt voices understanding Pt has cut down Not ready to quit  Given info about cessation program through the cancer center

## 2011-02-18 NOTE — Assessment & Plan Note (Signed)
Nature of pain has changed - no longer tender, now more dull , mild , persistant pain  Wonder if it could be radicular ABIs neg and films neg Will check MRI LS with hx of low LS deg disc dz  Then will make plan

## 2011-02-20 ENCOUNTER — Inpatient Hospital Stay: Admission: RE | Admit: 2011-02-20 | Payer: Managed Care, Other (non HMO) | Source: Ambulatory Visit

## 2011-02-21 ENCOUNTER — Encounter: Payer: Self-pay | Admitting: Family Medicine

## 2011-02-26 ENCOUNTER — Telehealth: Payer: Self-pay | Admitting: Family Medicine

## 2011-02-26 DIAGNOSIS — M79604 Pain in right leg: Secondary | ICD-10-CM

## 2011-02-26 NOTE — Telephone Encounter (Signed)
Message copied by Judy Pimple on Mon Feb 26, 2011  6:52 PM ------      Message from: Patience Musca      Created: Mon Feb 26, 2011  4:02 PM       Please see note      ----- Message -----         From: Roxy Manns, MD         Sent: 02/23/2011   8:40 AM           To: Yetta Glassman, LPN            Let pt know that his MRI does show some impingement on R L5 nerve root that could cause leg pain (from deg change of spine)      I would like to refer him to orthopedics for further eval and to disc tx options       Let me know if agreeable

## 2011-02-26 NOTE — Telephone Encounter (Signed)
We could go with neurology- I will do ref

## 2011-02-27 ENCOUNTER — Encounter: Payer: Self-pay | Admitting: Neurology

## 2011-03-12 ENCOUNTER — Ambulatory Visit (INDEPENDENT_AMBULATORY_CARE_PROVIDER_SITE_OTHER): Payer: Managed Care, Other (non HMO) | Admitting: Family Medicine

## 2011-03-12 ENCOUNTER — Encounter: Payer: Self-pay | Admitting: Family Medicine

## 2011-03-12 VITALS — BP 118/78 | HR 68 | Temp 98.6°F | Wt 187.2 lb

## 2011-03-12 DIAGNOSIS — J019 Acute sinusitis, unspecified: Secondary | ICD-10-CM | POA: Insufficient documentation

## 2011-03-12 DIAGNOSIS — J329 Chronic sinusitis, unspecified: Secondary | ICD-10-CM

## 2011-03-12 MED ORDER — AMOXICILLIN-POT CLAVULANATE 875-125 MG PO TABS: 1.0000 | ORAL_TABLET | Freq: Two times a day (BID) | ORAL | Status: AC

## 2011-03-12 NOTE — Progress Notes (Signed)
  Subjective:    Patient ID: Antonio Russell, male    DOB: 1961/01/19, 50 y.o.   MRN: [id number]  HPI CC: cold sxs  3 wk h/o congestion.  Started with ST, muffled ears, chest tightness with coughing productive of mild sputum.  Having sinus drainage.  Never fully improved.  Initially with sinus pressure now improved.  Feeling very tired/fatigued.  Started with fevers, none recently.  Mild myalgia/arthritis.  Mild increase SOB, increase cough (mild) and increased sputum production.  More sinus congestion.  So far has tried theraflu.  No abd pain, n/v, rashes.  No wheezing.  No chest pain.  H/o DDD, to see neuro doc in January.  Dr. Karie Schwalbe has sent him for MRI.  Has been taking ibuprofen 600mg  for back pain along with tylenol.    Smoking 1/2 ppd.  + son sick but now better.  No h/o asthma/COPD.  Review of Systems Per HPI    Objective:   Physical Exam  Nursing note and vitals reviewed. Constitutional: He appears well-developed and well-nourished. No distress.  HENT:  Head: Normocephalic and atraumatic.  Right Ear: Hearing, tympanic membrane, external ear and ear canal normal.  Left Ear: Hearing, tympanic membrane, external ear and ear canal normal.  Nose: Nose normal. No mucosal edema or rhinorrhea. Right sinus exhibits no maxillary sinus tenderness and no frontal sinus tenderness. Left sinus exhibits no maxillary sinus tenderness and no frontal sinus tenderness.  Mouth/Throat: Uvula is midline, oropharynx is clear and moist and mucous membranes are normal. No oropharyngeal exudate, posterior oropharyngeal edema, posterior oropharyngeal erythema or tonsillar abscesses.  Eyes: Conjunctivae and EOM are normal. Pupils are equal, round, and reactive to light. No scleral icterus.  Neck: Normal range of motion. Neck supple.  Cardiovascular: Normal rate, regular rhythm, normal heart sounds and intact distal pulses.   No murmur heard. Pulmonary/Chest: Effort normal and breath sounds normal. No  respiratory distress. He has no wheezes. He has no rales.  Lymphadenopathy:    He has no cervical adenopathy.  Skin: Skin is warm and dry. No rash noted.      Assessment & Plan:

## 2011-03-12 NOTE — Patient Instructions (Signed)
Sounds like a sinus infection. Take medicine as prescribed: augmentin twice daily for 10 days Push fluids and plenty of rest. Nasal saline irrigation or neti pot to help drain sinuses. May use simple mucinex with plenty of fluid to help mobilize mucous. Let us know if fever >101.5, trouble opening/closing mouth, difficulty swallowing, or worsening - you may need to be seen again.

## 2011-03-12 NOTE — Assessment & Plan Note (Signed)
Acute sinusitis, going on 3 wks +. Treat with augmentin. Update [place] if sxs not improving.

## 2011-03-14 ENCOUNTER — Other Ambulatory Visit: Payer: Self-pay | Admitting: Internal Medicine

## 2011-03-14 MED ORDER — HYDROCODONE-ACETAMINOPHEN 5-500 MG PO TABS: 1.0000 | ORAL_TABLET | Freq: Four times a day (QID) | ORAL | Status: AC | PRN

## 2011-03-14 NOTE — Telephone Encounter (Signed)
Has he seen the neurologist yet ? -- I wanted to get update on back / leg What is he already taking? What has worked in the past

## 2011-03-14 NOTE — Telephone Encounter (Signed)
Tell him he needs to space the ibuprofen out to every 6 hours- that is too close together Px written for call in  For hydrocodone

## 2011-03-14 NOTE — Telephone Encounter (Signed)
Patient stated he forgot to ask you when he was here for his appt. That he needs a Rx for back pain.  Please advise.

## 2011-03-14 NOTE — Telephone Encounter (Signed)
Px written for call in  - use with caution

## 2011-03-14 NOTE — Telephone Encounter (Signed)
Pt said he has appt with neurologist on 04/04/10 and is on the cancellation list and may be seen sooner if someone cancels.Pt states back pain is worse and the leg pain is about the same. Pt as been taking Ibuprofen 200 mg taking 4 tabs q4h. Pt said Hydrocodone helped when he took that back in April. Pt uses [facility] and can be reached at [phone].

## 2011-03-14 NOTE — Telephone Encounter (Signed)
Patient notified as instructed by telephone. Medication phoned to [facility] pharmacy as instructed.

## 2011-03-23 ENCOUNTER — Other Ambulatory Visit: Payer: Managed Care, Other (non HMO) | Admitting: Gastroenterology

## 2011-04-03 HISTORY — PX: KNEE SURGERY: SHX244

## 2011-04-04 ENCOUNTER — Ambulatory Visit: Payer: Managed Care, Other (non HMO) | Admitting: Neurology

## 2011-04-10 ENCOUNTER — Other Ambulatory Visit (INDEPENDENT_AMBULATORY_CARE_PROVIDER_SITE_OTHER): Payer: Managed Care, Other (non HMO)

## 2011-04-10 ENCOUNTER — Encounter: Payer: Self-pay | Admitting: Neurology

## 2011-04-10 ENCOUNTER — Ambulatory Visit (INDEPENDENT_AMBULATORY_CARE_PROVIDER_SITE_OTHER): Payer: Managed Care, Other (non HMO) | Admitting: Neurology

## 2011-04-10 VITALS — BP 116/72 | HR 84 | Wt 187.0 lb

## 2011-04-10 DIAGNOSIS — I776 Arteritis, unspecified: Secondary | ICD-10-CM

## 2011-04-10 LAB — SEDIMENTATION RATE: Sed Rate: 33 mm/hr — ABNORMAL HIGH (ref 0–22)

## 2011-04-10 LAB — COMPREHENSIVE METABOLIC PANEL
CO2: 27 mEq/L (ref 19–32)
Calcium: 10.2 mg/dL (ref 8.4–10.5)
Creatinine, Ser: 0.8 mg/dL (ref 0.4–1.5)
GFR: 102.68 mL/min (ref >60.00)
Glucose, Bld: 98 mg/dL (ref 70–99)
Sodium: 139 mEq/L (ref 135–145)
Total Bilirubin: 0.5 mg/dL (ref 0.3–1.2)
Total Protein: 7.6 g/dL (ref 6.0–8.3)

## 2011-04-10 LAB — CBC WITH DIFFERENTIAL/PLATELET
Eosinophils Relative: 4.5 % (ref 0.0–5.0)
HCT: 41.7 % (ref 39.0–52.0)
Lymphocytes Relative: 34.5 % (ref 12.0–46.0)
Lymphs Abs: 4.3 10*3/uL — ABNORMAL HIGH (ref 0.7–4.0)
Monocytes Relative: 9.2 % (ref 3.0–12.0)
Platelets: 259 10*3/uL (ref 150.0–400.0)
WBC: 12.3 10*3/uL — ABNORMAL HIGH (ref 4.5–10.5)

## 2011-04-10 MED ORDER — AMITRIPTYLINE HCL 25 MG PO TABS: ORAL_TABLET | ORAL | Status: DC

## 2011-04-10 MED ORDER — HYDROCODONE-ACETAMINOPHEN 5-500 MG PO TABS: 1.0000 | ORAL_TABLET | Freq: Four times a day (QID) | ORAL | Status: AC | PRN

## 2011-04-10 NOTE — Progress Notes (Signed)
Dear Dr. Milinda Antis,  Thank you for having me see Ivan Anchors [facility] in consultation today at [facility] for his problem with intermittent right leg pain.  As you may recall, he is a 51 y.o. year old male with a history of back pain and possible right L5 radiculopathy, who presents with intermittment bouts of severe numbness and dysethesia of the right leg on the lateral aspect of the leg.  This started in August, and the pain associated with it was always provoked by touching the affected area, it would typically last several weeks and then abate.  In October it presented with severe spontaneous pain that was stabbing and repetitive.  This resulted in him going to the emergency department.  They did not offer any etiologies, and he followed up with you and you ordered a MRI L-spine and arterial study.  The vascular study was normal, but the L-spine MRI was positive for a moderate stenosis of L5-S1 possibly affecting the L5 nerve root on the right.  He continues to get the pain and numbness.  It is unprovoked and unrelated to back pain.  He does hot have a history of injury.    Interestingly he has noted similar symptoms, although not as severe in the bottom of his left foot and his left lower and upper leg that began more recently.  He has used vicodin for the pain which helps it.  He currently is getting the pain every several days, and it can last for hours.  The patinet has not noted any rash, but has noted more dilated veins in the right lower leg.  Interestingly, a cardiac cath was done through the right femoral artery in April.  Past Medical History  Diagnosis Date  . Impotence of organic origin   . HLD (hyperlipidemia)   . Other testicular hypofunction   . Unspecified hearing loss   . Personal history of urinary calculi   . Tobacco use disorder   . Headache   . Anxiety   . Allergic rhinitis   . Chest pain 2012    cardiac cath - nonobstructive, nl LV fxn, rec aggressive med management    . Back pain   . Arthritis     Past Surgical History  Procedure Date  . Colonoscopy   . Hemorrhoid surgery   . Nasal septum surgery     History   Social History  . Marital Status: Married    Spouse Name: N/A    Number of Children: N/A  . Years of Education: N/A   Social History Main Topics  . Smoking status: Current Everyday Smoker -- 0.5 packs/day for 35 years    Types: Cigarettes  . Smokeless tobacco: Never Used   Comment: just recently decreased from 1 PPD-purchased electronic cigarette  . Alcohol Use: Yes     6 pack 3 times weekly  . Drug Use: No  . Sexually Active: None   Other Topics Concern  . None   Social History Narrative   Coaches basketball    Family History  Problem Relation Age of Onset  . Lung cancer      GF  . Heart disease      GM  . Hypertension Mother   . Hyperlipidemia Mother   . Diabetes Mother   . Hypertension Father   . Hyperlipidemia Father   . Hypertension Sister     Current Outpatient Prescriptions on File Prior to Visit  Medication Sig Dispense Refill  . aspirin 81 MG tablet  Take 81 mg by mouth daily.        . fluticasone (FLONASE) 50 MCG/ACT nasal spray 2 sprays by Nasal route daily as needed.        . nitroGLYCERIN (NITROSTAT) 0.4 MG SL tablet Place 0.4 mg under the tongue every 5 (five) minutes as needed. May repeat up to 3 times       . sildenafil (VIAGRA) 100 MG tablet Take 1 tablet (100 mg total) by mouth as needed for erectile dysfunction.  10 tablet  11  . cyclobenzaprine (FLEXERIL) 5 MG tablet Take 5 mg by mouth 3 (three) times daily as needed.        . pravastatin (PRAVACHOL) 40 MG tablet Take 1 tablet (40 mg total) by mouth every evening.  30 tablet  11    Allergies  Allergen Reactions  . Crestor (Rosuvastatin Calcium)     Muscle aches  . Lipitor (Atorvastatin Calcium)     Arm soreness   . Sertraline Hcl     REACTION: muscle twitches  . Zocor (Simvastatin - High Dose)     Muscle pain       ROS:  13  systems were reviewed and are notable for a history of ophthalmic migrains.  All other review of systems are unremarkable.   Examination:  Filed Vitals:   04/10/11 1458  BP: 116/72  Pulse: 84  Weight: 187 lb (84.823 kg)     In general, well appearing  Cardiovascular: The patient has a regular rate and rhythm and no carotid bruits.  Fundoscopy:  Disks are flat. Vessel caliber within normal limits.  Mental status:   The patient is oriented to person, place and time. Recent and remote memory are intact. Attention span and concentration are normal. Language including repetition, naming, following commands are intact. Fund of knowledge of current and historical events, as well as vocabulary are normal.  Cranial Nerves: Pupils are equally round and reactive to light. Visual fields full to confrontation. Extraocular movements are intact without nystagmus. Facial sensation and muscles of mastication are intact. Muscles of facial expression are symmetric. Hearing intact to bilateral finger rub. Tongue protrusion, uvula, palate midline.  Shoulder shrug intact  Motor:  The patient has normal bulk and tone, no pronator drift.  There are no adventitious movements.  5/5 strength including foot inversion and eversion.  Reflexes:   Biceps  Triceps Brachioradialis Knee Ankle  Right 2+  2+  2+   2+ 2+  Left  2+  2+  2+   2+ 2+  Toes down  Coordination:  Normal finger to nose.  No dysdiadokinesia.  Sensation is reveals a patch of decrease sensation to pin prick on the lateral aspect of the right lower leg just above the ankle.  Gait and Station are normal.  Tandem gait is intact.  Romberg is negative.  MRI review reveals right L5-S1 disc herniation.   Impression/Recs: Paroxysmal dysethesias and parasthesias mainly in the right lower leg, but also involving the bottom of the left  foot.  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  However, it  is possible this is a L5 radiculopathy, but frankly it would be unusual for it to have such prominent dysesthetic pain that is so localized.  I am also concerned about a possible mononeuritis multiplex.  Certainly the difference in caliber of the blood vessels on the left lower leg makes me think about a possible vasculitis but it certainly doesn't have the appearance  of a leukocytoclastic vasculitis.  I am going to proceed by treating him symptomatically.  We will start Elavil 25->50qhs.  I will also get some blood tests to look for vasculitis.  If he continues to get worse, in that different regions are involved or the pain is more persistent then I will get an EMG/NCS.  He is reluctant to do this, as he has had a bad experience with it before but I will push for it next time if my initial tests are negative.  We will see the patient back in 6 weeks.  Thank you for having [place] see Ivan Anchors Jindra in consultation.  Feel free to contact me with any questions.  Lupita Raider Modesto Charon, MD [facility], [place] [address] [place], [place] [address] Phone: [phone] Fax: [phone].

## 2011-04-10 NOTE — Patient Instructions (Signed)
Go to the basement to have your labs drawn today.  We will see you back on Monday, February, 25th at 3:00pm.

## 2011-04-11 LAB — RHEUMATOID FACTOR: Rhuematoid fact SerPl-aCnc: 12 IU/mL (ref <=14)

## 2011-04-11 LAB — HEPATITIS A ANTIBODY, TOTAL: Hep A Total Ab: NEGATIVE

## 2011-04-11 LAB — C-REACTIVE PROTEIN: CRP: 1.18 mg/dL — ABNORMAL HIGH (ref <0.60)

## 2011-04-11 LAB — ANTI-NUCLEAR AB-TITER (ANA TITER): ANA Titer 1: NEGATIVE

## 2011-04-11 LAB — HEPATITIS B CORE ANTIBODY, TOTAL: Hep B Core Total Ab: NEGATIVE

## 2011-04-12 LAB — PROTEIN ELECTROPHORESIS, SERUM
Beta 2: 4.4 % (ref 3.2–6.5)
Gamma Globulin: 15.1 % (ref 11.1–18.8)

## 2011-04-12 LAB — HEPATITIS B E ANTIBODY: Hepatitis Be Antibody: NEGATIVE

## 2011-04-16 LAB — CRYOGLOBULIN

## 2011-04-20 ENCOUNTER — Telehealth: Payer: Self-pay | Admitting: Neurology

## 2011-04-20 NOTE — Progress Notes (Signed)
Spoke with the patient's wife and informed re: lab results as directed by Dr. Modesto Charon. (Please let Mr. Antonio Russell know that all his blood tests looked ok, except for some mild signs of inflammation that we will just keep an eye on. No obvious cause of his pain and numbness in his leg). No other issues.

## 2011-04-20 NOTE — Telephone Encounter (Signed)
"  Please let Antonio Russell know that all his blood tests looked ok, except for some mild signs of inflammation that we will just keep an eye on. No obvious cause of his pain and numbness in his leg" per Dr. Modesto Charon. Spoke with the patient's wife, Antonio Russell. Info given as per Dr. Modesto Charon. Reminded of follow up appointment in February. No additional issues.

## 2011-05-08 ENCOUNTER — Ambulatory Visit (INDEPENDENT_AMBULATORY_CARE_PROVIDER_SITE_OTHER): Payer: Managed Care, Other (non HMO) | Admitting: Family Medicine

## 2011-05-08 ENCOUNTER — Encounter: Payer: Self-pay | Admitting: Family Medicine

## 2011-05-08 VITALS — BP 130/88 | HR 88 | Temp 98.1°F | Ht 73.0 in | Wt 186.0 lb

## 2011-05-08 DIAGNOSIS — M79602 Pain in left arm: Secondary | ICD-10-CM

## 2011-05-08 DIAGNOSIS — I2510 Atherosclerotic heart disease of native coronary artery without angina pectoris: Secondary | ICD-10-CM

## 2011-05-08 DIAGNOSIS — M79609 Pain in unspecified limb: Secondary | ICD-10-CM

## 2011-05-08 DIAGNOSIS — F17200 Nicotine dependence, unspecified, uncomplicated: Secondary | ICD-10-CM

## 2011-05-08 NOTE — Assessment & Plan Note (Signed)
With risk factors of hyperlipidemia and smoking Pt seems relatively unconcerned Will not take statin due to his leg pain of ? etiol Also not ready to quit smoking  Cath 1 y ago - showed mild dz  In light of intermittent arm pain - will ref to cardiol for a visit

## 2011-05-08 NOTE — Patient Instructions (Signed)
We will refer you to cardiology at check out  Exam is re assuring today, but if arm pain or chest pain return or worsen/ persist - especially with shortness of breath or other symptoms- please go to ER Please think hard about quitting smoking

## 2011-05-08 NOTE — Assessment & Plan Note (Signed)
Disc in detail risks of smoking and possible outcomes including copd, vascular/ heart disease, cancer , respiratory and sinus infections  Pt voices understanding  He states he is not ready to quit

## 2011-05-08 NOTE — Assessment & Plan Note (Signed)
No etiology found - nl and reassuring exam  No s/s of injury or nerve compression/ comprimise Doubt cardiac related but due to risk factors and intermittent nature will ref to cardio for further eval Pt aware if symptoms return/ persist/ worsen- to seek care at the ER

## 2011-05-08 NOTE — Progress Notes (Signed)
Subjective:    Patient ID: Antonio Russell, male    DOB: 11-28-1960, 51 y.o.   MRN: [id number]  HPI Last night when he went to bed arm hurt for no reason -- dull throbbing pain - enough to make him unable to sleep Took 2 baby asa and it went away  Happened again today -12:30- 1:30 and then 3 pm  (now just got better)  -- took baby asa , lasted  Symptoms are not exertional at all  No sob or nausea or sweating   Had a cardiac cath in 2012  Also EKG looks ok today also   Still not taking the pravastatin due to leg pain -- is holding off  Still has not figured out what is going on with leg   Still smoking - though cut back  Not ready to quit Understands risks No cough or sob   On amitriptyline for leg pain  Is not as severe as it was   Patient Active Problem List  Diagnoses  . HYPOGONADISM  . HYPERLIPIDEMIA  . TOBACCO USE  . LOSS, HEARING NOS  . ERECTILE DYSFUNCTION, ORGANIC  . RENAL CALCULUS, HX OF  . CAD (coronary artery disease)  . Unspecified essential hypertension  . Skin rash  . Back pain  . Arthritis  . Tachycardia  . Right leg pain  . Special screening for malignant neoplasms, colon  . Sinusitis  . Arm pain, left   Past Medical History  Diagnosis Date  . Impotence of organic origin   . HLD (hyperlipidemia)   . Other testicular hypofunction   . Unspecified hearing loss   . Personal history of urinary calculi   . Tobacco use disorder   . Headache   . Anxiety   . Allergic rhinitis   . Chest pain 2012    cardiac cath - nonobstructive, nl LV fxn, rec aggressive med management  . Back pain   . Arthritis    Past Surgical History  Procedure Date  . Colonoscopy   . Hemorrhoid surgery   . Nasal septum surgery    History  Substance Use Topics  . Smoking status: Current Everyday Smoker -- 0.5 packs/day for 35 years    Types: Cigarettes  . Smokeless tobacco: Never Used   Comment: just recently decreased from 1 PPD-purchased electronic cigarette  . Alcohol  Use: Yes     6 pack 3 times weekly   Family History  Problem Relation Age of Onset  . Lung cancer      GF  . Heart disease      GM  . Hypertension Mother   . Hyperlipidemia Mother   . Diabetes Mother   . Hypertension Father   . Hyperlipidemia Father   . Hypertension Sister    Allergies  Allergen Reactions  . Crestor (Rosuvastatin Calcium)     Muscle aches  . Lipitor (Atorvastatin Calcium)     Arm soreness   . Sertraline Hcl     REACTION: muscle twitches  . Zocor (Simvastatin - High Dose)     Muscle pain    Current Outpatient Prescriptions on File Prior to Visit  Medication Sig Dispense Refill  . amitriptyline (ELAVIL) 25 MG tablet start with 1 tablet at night for 2 weeks, then increase to two tablets at night.  60 tablet  3  . fluticasone (FLONASE) 50 MCG/ACT nasal spray 2 sprays by Nasal route daily as needed.        [name] HYDROcodone-acetaminophen (VICODIN) 5-500 MG per  tablet Take 1 tablet by mouth every 6 (six) hours as needed for pain.  30 tablet  0  . aspirin 81 MG tablet Take 81 mg by mouth daily.        . cyclobenzaprine (FLEXERIL) 5 MG tablet Take 5 mg by mouth 3 (three) times daily as needed.        . nitroGLYCERIN (NITROSTAT) 0.4 MG SL tablet Place 0.4 mg under the tongue every 5 (five) minutes as needed. May repeat up to 3 times       . pravastatin (PRAVACHOL) 40 MG tablet Take 1 tablet (40 mg total) by mouth every evening.  30 tablet  11  . sildenafil (VIAGRA) 100 MG tablet Take 1 tablet (100 mg total) by mouth as needed for erectile dysfunction.  10 tablet  11       Review of Systems Review of Systems  Constitutional: Negative for fever, appetite change, fatigue and unexpected weight change.  Eyes: Negative for pain and visual disturbance.  Respiratory: Negative for cough and shortness of breath.   Cardiovascular: Negative for cp or palpitations   no edema or sob on exettion  Gastrointestinal: Negative for nausea, diarrhea and constipation.  Genitourinary:  Negative for urgency and frequency.  Skin: Negative for pallor or rash   MSK pos for intermittent leg pain and now arm pain, neg for swollen joints  Neurological: Negative for weakness, light-headedness, numbness and headaches.  Hematological: Negative for adenopathy. Does not bruise/bleed easily.  Psychiatric/Behavioral: Negative for dysphoric mood. The patient is not nervous/anxious.          Objective:   Physical Exam  Constitutional: He appears well-developed and well-nourished. No distress.  HENT:  Head: Normocephalic and atraumatic.  Mouth/Throat: Oropharynx is clear and moist.  Eyes: Conjunctivae and EOM are normal. Pupils are equal, round, and reactive to light. Right eye exhibits no discharge. Left eye exhibits no discharge. No scleral icterus.  Neck: Normal range of motion. Neck supple. No JVD present. No thyromegaly present.  Cardiovascular: Normal rate, regular rhythm, normal heart sounds and intact distal pulses.  Exam reveals no gallop.   Pulmonary/Chest: Effort normal and breath sounds normal. No respiratory distress. He has no wheezes. He has no rales. He exhibits no tenderness.       Diffusely distant bs   Abdominal: Soft. Bowel sounds are normal.  Musculoskeletal: He exhibits no edema and no tenderness.       L arm - no tenderness whatsoever Nl rom arm and shoulder  Neg hawking/ neer Nl rom entire UE  Nl DTRs   Lymphadenopathy:    He has no cervical adenopathy.  Neurological: He is alert. He has normal reflexes. He exhibits normal muscle tone. Coordination normal.  Skin: Skin is warm and dry. No rash noted. No erythema. No pallor.  Psychiatric: He has a normal mood and affect.          Assessment & Plan:

## 2011-05-14 ENCOUNTER — Ambulatory Visit: Payer: Managed Care, Other (non HMO) | Admitting: Nurse Practitioner

## 2011-05-24 ENCOUNTER — Ambulatory Visit: Payer: Managed Care, Other (non HMO) | Admitting: Nurse Practitioner

## 2011-05-28 ENCOUNTER — Encounter: Payer: Self-pay | Admitting: Neurology

## 2011-05-28 ENCOUNTER — Ambulatory Visit (INDEPENDENT_AMBULATORY_CARE_PROVIDER_SITE_OTHER): Payer: Managed Care, Other (non HMO) | Admitting: Neurology

## 2011-05-28 DIAGNOSIS — R209 Unspecified disturbances of skin sensation: Secondary | ICD-10-CM

## 2011-05-28 DIAGNOSIS — M549 Dorsalgia, unspecified: Secondary | ICD-10-CM

## 2011-05-28 MED ORDER — AMITRIPTYLINE HCL 25 MG PO TABS: 25.0000 mg | ORAL_TABLET | Freq: Every day | ORAL | Status: DC

## 2011-05-28 NOTE — Progress Notes (Signed)
Dear Dr. Milinda Antis,  I saw  Antonio Russell [facility] back in [place] [facility] for his problem with burning dysethesias in the lateral aspect of his right lower leg.  As you may recall, he is a 52 y.o. year old male with a history of moderate spinal stenosis who also has lower back pain and sciatic pain radiating down the right leg.    At his last visit I was worried about a possible vasculitic neuropathy given the burning dysethesias.  I was uncertain whether the pain was coming from the lumbar spine.  I checked vasculitic labs and found mildly elevated CRP and ESR although unlikely to be representative of a vasculitic neuropathy.  I started the patient on Elavil and he had complete resolution of his pain and dysesthesias.  He could not tolerate higher doses of the Elavil given sedation.  He continues to complain of pain in his back and shooting pain down his right leg.  It comes and goes.  He has not had ESI.  He does not feel the Elavil helps it.  Medical history, social history, and family history were reviewed and have not changed since the last clinic visit.  Current Outpatient Prescriptions on File Prior to Visit  Medication Sig Dispense Refill  . aspirin 81 MG tablet Take 81 mg by mouth daily.        . fluticasone (FLONASE) 50 MCG/ACT nasal spray 2 sprays by Nasal route daily as needed.        . nitroGLYCERIN (NITROSTAT) 0.4 MG SL tablet Place 0.4 mg under the tongue every 5 (five) minutes as needed. May repeat up to 3 times       . pravastatin (PRAVACHOL) 40 MG tablet Take 1 tablet (40 mg total) by mouth every evening.  30 tablet  11  . sildenafil (VIAGRA) 100 MG tablet Take 1 tablet (100 mg total) by mouth as needed for erectile dysfunction.  10 tablet  11  . cyclobenzaprine (FLEXERIL) 5 MG tablet Take 5 mg by mouth 3 (three) times daily as needed.        [name] HYDROcodone-acetaminophen (VICODIN) 5-500 MG per tablet Take 1 tablet by mouth every 6 (six) hours as needed for pain.  30 tablet  0  -  Elavil 25mg  qhs  Allergies  Allergen Reactions  . Crestor (Rosuvastatin Calcium)     Muscle aches  . Lipitor (Atorvastatin Calcium)     Arm soreness   . Sertraline Hcl     REACTION: muscle twitches  . Zocor (Simvastatin - High Dose)     Muscle pain     ROS:  13 systems were reviewed and are notable for fatigue, and difficulty sleeping.  Patient has OSA but does not use a mask..  All other review of systems are unremarkable.  Exam: . Filed Vitals:   05/28/11 1452  BP: 142/86  Pulse: 92  Height: 5\' 11"  (1.803 m)  Weight: 188 lb (85.276 kg)    In general, well appearing man.  Mental status:   The patient is oriented to person, place and time. Recent and remote memory are intact. Attention span and concentration are normal. Language including repetition, naming, following commands are intact. Fund of knowledge of current and historical events, as well as vocabulary are normal.   Motor:  Normal bulk and tone, no drift and 5/5 muscle strength bilaterally.  Reflexes:  Quiet throughout.  Sensory:  still a patch of decreased temp sensation on the lateral aspect of the right leg.  Gait:  Normal gait and station.    Impression/Recommendations: 51 year old with painful dysethesias on the right lateral aspect of lower leg, no clear etiology.  Well controlled with Elavil 25mg  qhs.  I am not impressed that this represents a malignant process.  We will just continue to monitor.  With respect to his back pain I have asked him to see his orthopedist for consideration of ESI which could help his sciatic pain.  We will see the patient back on a prn basis.  Lupita Raider Modesto Charon, MD [facility], [place]

## 2011-07-26 ENCOUNTER — Encounter: Payer: Self-pay | Admitting: Gastroenterology

## 2012-05-17 ENCOUNTER — Other Ambulatory Visit: Payer: Self-pay

## 2012-08-01 ENCOUNTER — Ambulatory Visit
Admission: RE | Admit: 2012-08-01 | Discharge: 2012-08-01 | Disposition: A | Discharge status: Home / Self Care | Payer: Managed Care, Other (non HMO) | Source: Ambulatory Visit | Attending: Nurse Practitioner | Admitting: Nurse Practitioner

## 2012-08-01 ENCOUNTER — Other Ambulatory Visit: Payer: Self-pay | Admitting: Nurse Practitioner

## 2012-08-01 DIAGNOSIS — M542 Cervicalgia: Secondary | ICD-10-CM

## 2012-12-04 ENCOUNTER — Ambulatory Visit
Admission: RE | Admit: 2012-12-04 | Discharge: 2012-12-04 | Disposition: A | Discharge status: Home / Self Care | Payer: Managed Care, Other (non HMO) | Source: Ambulatory Visit | Attending: Nurse Practitioner | Admitting: Nurse Practitioner

## 2012-12-04 ENCOUNTER — Other Ambulatory Visit: Payer: Self-pay | Admitting: Nurse Practitioner

## 2012-12-04 DIAGNOSIS — M25511 Pain in right shoulder: Secondary | ICD-10-CM

## 2012-12-04 DIAGNOSIS — M545 Low back pain, unspecified: Secondary | ICD-10-CM

## 2013-02-05 ENCOUNTER — Other Ambulatory Visit: Payer: Self-pay

## 2013-12-01 ENCOUNTER — Encounter: Payer: Self-pay | Admitting: Gastroenterology

## 2014-01-15 ENCOUNTER — Other Ambulatory Visit: Payer: Self-pay

## 2014-01-29 ENCOUNTER — Ambulatory Visit (AMBULATORY_SURGERY_CENTER): Payer: Managed Care, Other (non HMO) | Admitting: *Deleted

## 2014-01-29 VITALS — Ht 72.0 in | Wt 189.8 lb

## 2014-01-29 DIAGNOSIS — Z1211 Encounter for screening for malignant neoplasm of colon: Secondary | ICD-10-CM

## 2014-01-29 MED ORDER — MOVIPREP 100 G PO SOLR: 1.0000 | Freq: Once | ORAL | Status: DC

## 2014-01-29 NOTE — Progress Notes (Signed)
Denies allergies to eggs or soy products. Denies complications with sedation or anesthesia. Denies O2 use. Denies use of diet or weight loss medications.  Emmi instructions given for colonoscopy.  

## 2014-02-05 ENCOUNTER — Ambulatory Visit (AMBULATORY_SURGERY_CENTER): Payer: Managed Care, Other (non HMO) | Admitting: Gastroenterology

## 2014-02-05 ENCOUNTER — Encounter: Payer: Self-pay | Admitting: Gastroenterology

## 2014-02-05 VITALS — BP 129/92 | HR 72 | Temp 97.6°F | Resp 13 | Ht 72.0 in | Wt 189.0 lb

## 2014-02-05 DIAGNOSIS — K621 Rectal polyp: Secondary | ICD-10-CM

## 2014-02-05 DIAGNOSIS — Z1211 Encounter for screening for malignant neoplasm of colon: Secondary | ICD-10-CM

## 2014-02-05 DIAGNOSIS — D129 Benign neoplasm of anus and anal canal: Secondary | ICD-10-CM

## 2014-02-05 DIAGNOSIS — D125 Benign neoplasm of sigmoid colon: Secondary | ICD-10-CM

## 2014-02-05 DIAGNOSIS — D128 Benign neoplasm of rectum: Secondary | ICD-10-CM

## 2014-02-05 MED ORDER — SODIUM CHLORIDE 0.9 % IV SOLN: 500.0000 mL | INTRAVENOUS | Status: DC

## 2014-02-05 NOTE — Progress Notes (Signed)
Called to room to assist during endoscopic procedure.  Patient ID and intended procedure confirmed with present staff. Received instructions for my participation in the procedure from the performing physician.  

## 2014-02-05 NOTE — Patient Instructions (Signed)
Impressions/recommendations:  Polyps (handout given) Hemorrhoids (handout given)  Hold aspirin and all other NSAIDS for 2 weeks. May resume 11/21. Repeat colonoscopy pending pathology results.  YOU HAD AN ENDOSCOPIC PROCEDURE TODAY AT [facility]: Refer to the procedure report that was given to you for any specific questions about what was found during the examination.  If the procedure report does not answer your questions, please call your gastroenterologist to clarify.  If you requested that your care partner not be given the details of your procedure findings, then the procedure report has been included in a sealed envelope for you to review at your convenience later.  YOU SHOULD EXPECT: Some feelings of bloating in the abdomen. Passage of more gas than usual.  Walking can help get rid of the air that was put into your GI tract during the procedure and reduce the bloating. If you had a lower endoscopy (such as a colonoscopy or flexible sigmoidoscopy) you may notice spotting of blood in your stool or on the toilet paper. If you underwent a bowel prep for your procedure, then you may not have a normal bowel movement for a few days.  DIET: Your first meal following the procedure should be a light meal and then it is ok to progress to your normal diet.  A half-sandwich or bowl of soup is an example of a good first meal.  Heavy or fried foods are harder to digest and may make you feel nauseous or bloated.  Likewise meals heavy in dairy and vegetables can cause extra gas to form and this can also increase the bloating.  Drink plenty of fluids but you should avoid alcoholic beverages for 24 hours.  ACTIVITY: Your care partner should take you home directly after the procedure.  You should plan to take it easy, moving slowly for the rest of the day.  You can resume normal activity the day after the procedure however you should NOT DRIVE or use heavy machinery for 24 hours (because of the  sedation medicines used during the test).    SYMPTOMS TO REPORT IMMEDIATELY: A gastroenterologist can be reached at any hour.  During normal business hours, 8:30 AM to 5:00 PM Monday through Friday, call [phone].  After hours and on weekends, please call the GI answering service at [phone] who will take a message and have the physician on call contact you.   Following lower endoscopy (colonoscopy or flexible sigmoidoscopy):  Excessive amounts of blood in the stool  Significant tenderness or worsening of abdominal pains  Swelling of the abdomen that is new, acute  Fever of 100F or higher   FOLLOW UP: If any biopsies were taken you will be contacted by phone or by letter within the next 1-3 weeks.  Call your gastroenterologist if you have not heard about the biopsies in 3 weeks.  Our staff will call the home number listed on your records the next business day following your procedure to check on you and address any questions or concerns that you may have at that time regarding the information given to you following your procedure. This is a courtesy call and so if there is no answer at the home number and we have not heard from you through the emergency physician on call, we will assume that you have returned to your regular daily activities without incident.  SIGNATURES/CONFIDENTIALITY: You and/or your care partner have signed paperwork which will be entered into your electronic medical record.  These signatures  attest to the fact that that the information above on your After Visit Summary has been reviewed and is understood.  Full responsibility of the confidentiality of this discharge information lies with you and/or your care-partner.

## 2014-02-05 NOTE — Op Note (Signed)
[place]  [address] [place], [id number]   COLONOSCOPY PROCEDURE REPORT  PATIENT: Antonio Russell, Antonio Russell  MR#: [id number] BIRTHDATE: [birth year] , 22  yrs. old GENDER: male ENDOSCOPIST: Ladene Artist, MD, [facility] REFERRED KX:FGHWE Vernell Morgans, M.D. PROCEDURE DATE:  02/05/2014 PROCEDURE:   Colonoscopy with snare polypectomy First Screening Colonoscopy - Avg.  risk and is 50 yrs.  old or older Yes.  Prior Negative Screening - Now for repeat screening. N/A  History of Adenoma - Now for follow-up colonoscopy & has been > or = to 3 yrs.  N/A  Polyps Removed Today? Yes. ASA CLASS:   Class II INDICATIONS:average risk for colorectal cancer. MEDICATIONS: Monitored anesthesia care and Propofol 250 mg IV DESCRIPTION OF PROCEDURE:   After the risks benefits and alternatives of the procedure were thoroughly explained, informed consent was obtained.  The digital rectal exam revealed no abnormalities of the rectum.   The LB [id number]  endoscope was introduced through the anus and advanced to the cecum, which was identified by both the appendix and ileocecal valve. No adverse events experienced.   The quality of the prep was excellent, using MoviPrep  The instrument was then slowly withdrawn as the colon was fully examined.  COLON FINDINGS: A pedunculated polyp measuring 8 mm in size was found in the sigmoid colon.  A polypectomy was performed using snare cautery.   Two sessile polyps measuring 7-8 mm in size were found in the rectum.  A polypectomy was performed using snare cautery.  The resection was complete, the polyp tissue was completely retrieved and sent to histology.  The examination was otherwise normal.  Retroflexed views revealed internal Grade I hemorrhoids. The time to cecum=2 minutes 32 seconds.  Withdrawal time=10 minutes 49 seconds.  The scope was withdrawn and the procedure completed.  COMPLICATIONS: There were no immediate complications.  ENDOSCOPIC  IMPRESSION: 1.   Pedunculated polyp in the sigmoid colon; polypectomy performed using a hot snare 2.   Two sessile polyps in the rectum; polypectomy performed using snare cautery 3.   Grade I internal hemorrhoids  RECOMMENDATIONS: 1.  Hold Aspirin and all other NSAIDS for 2 weeks. 2.  Await pathology results 3.  Repeat colonoscopy in 3 years if 3 polyps adenomatous; 5 years if 1-2 adenomatous; otherwise 10 years  eSigned:  Ladene Artist, MD, [facility] 02/05/2014 8:28 AM

## 2014-02-05 NOTE — Progress Notes (Signed)
Report to PACU, RN, vss, BBS= Clear.  

## 2014-02-08 ENCOUNTER — Telehealth: Payer: Self-pay | Admitting: *Deleted

## 2014-02-08 NOTE — Telephone Encounter (Signed)
  Follow up Call-  Call back number 02/05/2014  Post procedure Call Back phone  # [phone]  Permission to leave phone message Yes     Patient questions:  Do you have a fever, pain , or abdominal swelling? No. Pain Score  0 *  Have you tolerated food without any problems? Yes.    Have you been able to return to your normal activities? Yes.    Do you have any questions about your discharge instructions: Diet   No. Medications  No. Follow up visit  No.  Do you have questions or concerns about your Care? No.  Actions: * If pain score is 4 or above: No action needed, pain <4.

## 2014-02-10 ENCOUNTER — Encounter: Payer: Self-pay | Admitting: Gastroenterology

## 2014-07-13 ENCOUNTER — Emergency Department
Admit: 2014-07-13 | Disposition: A | Discharge status: Home / Self Care | Payer: Self-pay | Admitting: Emergency Medicine

## 2014-07-13 ENCOUNTER — Telehealth: Payer: Self-pay

## 2014-07-13 NOTE — Telephone Encounter (Signed)
Pt walked in with lacerations to lt index finger; finger was caught between tractor and jack stand; pt said lt index finger hurts all the time, pt can move finger but has laceration on side of finger and wider laceration on back side of finger. Bleeding is controlled and dressed lt index finger with telfa and gauze; did not clean finger due to restarting bleeding. Dr Glori Bickers advised pt should go to [facility] or ED. Pt's wife picked up pt and will take him to [facility]. Pt is aware he had not had recent tetanus shot.

## 2014-11-05 ENCOUNTER — Ambulatory Visit (INDEPENDENT_AMBULATORY_CARE_PROVIDER_SITE_OTHER): Payer: Managed Care, Other (non HMO) | Admitting: Primary Care

## 2014-11-05 ENCOUNTER — Ambulatory Visit (INDEPENDENT_AMBULATORY_CARE_PROVIDER_SITE_OTHER)
Admission: RE | Admit: 2014-11-05 | Discharge: 2014-11-05 | Disposition: A | Discharge status: Home / Self Care | Payer: Managed Care, Other (non HMO) | Source: Ambulatory Visit | Attending: Primary Care | Admitting: Primary Care

## 2014-11-05 ENCOUNTER — Encounter: Payer: Self-pay | Admitting: Primary Care

## 2014-11-05 ENCOUNTER — Telehealth: Payer: Self-pay | Admitting: Family Medicine

## 2014-11-05 VITALS — BP 126/84 | HR 88 | Temp 97.5°F | Ht 71.0 in | Wt 195.8 lb

## 2014-11-05 DIAGNOSIS — R109 Unspecified abdominal pain: Secondary | ICD-10-CM

## 2014-11-05 DIAGNOSIS — R1010 Upper abdominal pain, unspecified: Secondary | ICD-10-CM | POA: Diagnosis not present

## 2014-11-05 LAB — POCT URINALYSIS DIPSTICK
Bilirubin, UA: NEGATIVE
Glucose, UA: NEGATIVE
KETONES UA: NEGATIVE
Leukocytes, UA: NEGATIVE
Nitrite, UA: NEGATIVE
PH UA: 6
PROTEIN UA: NEGATIVE
RBC UA: NEGATIVE
SPEC GRAV UA: 1.025
Urobilinogen, UA: NEGATIVE

## 2014-11-05 MED ORDER — HYDROCODONE-ACETAMINOPHEN 5-325 MG PO TABS: 1.0000 | ORAL_TABLET | Freq: Four times a day (QID) | ORAL | Status: DC | PRN

## 2014-11-05 MED ORDER — TAMSULOSIN HCL 0.4 MG PO CAPS: 0.4000 mg | ORAL_CAPSULE | Freq: Every day | ORAL | Status: DC

## 2014-11-05 NOTE — Progress Notes (Signed)
Pre visit review using our clinic review tool, if applicable. No additional management support is needed unless otherwise documented below in the visit note. 

## 2014-11-05 NOTE — Progress Notes (Signed)
Subjective:    Patient ID: Antonio Russell, male    DOB: March 11, 1961, 54 y.o.   MRN: [id number]  HPI  Mr. Antonio Russell is a 54 year old male who presents today with a chief complaint of flank pain. His flank pain is located to the right side and began on Monday this week. He also reports feeling fatigued and not well all week. His pain has gradually become worse over the past several days as he feels it moving downward. He reports slight dysuria, denies hematuria, fevers. He typically drinks coffee, sweet tea, and beer, propel. He does not drink water. Denies recent injury.  Review of Systems  Constitutional: Negative for fever and chills.  Respiratory: Negative for shortness of breath.   Cardiovascular: Negative for chest pain.  Gastrointestinal: Negative for nausea, vomiting, abdominal pain, diarrhea and constipation.  Genitourinary: Positive for dysuria and flank pain. Negative for frequency, hematuria, discharge, scrotal swelling and penile pain.       Past Medical History  Diagnosis Date  . Impotence of organic origin   . HLD (hyperlipidemia)   . Other testicular hypofunction   . Unspecified hearing loss   . Personal history of urinary calculi   . Tobacco use disorder   . Headache(784.0)   . Anxiety   . Allergic rhinitis   . Chest pain 2012    cardiac cath - nonobstructive, nl LV fxn, rec aggressive med management  . Back pain   . Arthritis     History   Social History  . Marital Status: Married    Spouse Name: N/A  . Number of Children: N/A  . Years of Education: N/A   Occupational History  . Not on file.   Social History Main Topics  . Smoking status: Current Every Day Smoker -- 1.00 packs/day for 35 years    Types: Cigarettes  . Smokeless tobacco: Former Systems developer     Comment: just recently decreased from 1 P[employer]-purchased electronic cigarette  . Alcohol Use: 0.0 oz/week    0 Standard drinks or equivalent per week     Comment: 6 pack 3 times weekly  . Drug Use: No  .  Sexual Activity: Not on file   Other Topics Concern  . Not on file   Social History Narrative   Coaches basketball    Past Surgical History  Procedure Laterality Date  . Colonoscopy    . Hemorrhoid surgery    . Nasal septum surgery    . Knee surgery Right 2013    Family History  Problem Relation Age of Onset  . Lung cancer      GF  . Heart disease      GM  . Hypertension Mother   . Hyperlipidemia Mother   . Diabetes Mother   . Hypertension Father   . Hyperlipidemia Father   . Hypertension Sister   . Esophageal cancer Paternal Uncle   . Colon cancer Neg Hx   . Stomach cancer Neg Hx   . Rectal cancer Neg Hx     Allergies  Allergen Reactions  . Crestor [Rosuvastatin Calcium]     Muscle aches  . Lipitor [Atorvastatin Calcium]     Arm soreness   . Sertraline Hcl     REACTION: muscle twitches  . Zocor [Simvastatin - High Dose]     Muscle pain     Current Outpatient Prescriptions on File Prior to Visit  Medication Sig Dispense Refill  . aspirin 81 MG tablet Take 81 mg by  mouth daily.      . cetirizine (ZYRTEC) 10 MG tablet Take 10 mg by mouth as needed for allergies.    . chlorpheniramine-HYDROcodone (TUSSIONEX) 10-8 MG/5ML LQCR   0  . ergocalciferol (VITAMIN D2) 50000 UNITS capsule Take 50,000 Units by mouth once a week.    . fluticasone (FLONASE) 50 MCG/ACT nasal spray 2 sprays by Nasal route daily as needed.      [name] losartan (COZAAR) 25 MG tablet Take 12.5 mg by mouth daily.    [name] testosterone cypionate (DEPOTESTOTERONE CYPIONATE) 100 MG/ML injection Inject into the muscle every 28 (twenty-eight) days. For IM use only    . zolpidem (AMBIEN CR) 12.5 MG CR tablet   5   No current facility-administered medications on file prior to visit.    BP 126/84 mmHg  Pulse 88  Temp(Src) 97.5 F (36.4 C) (Oral)  Ht 5\' 11"  (1.803 m)  Wt 195 lb 12.8 oz (88.814 kg)  BMI 27.32 kg/m2  SpO2 98%    Objective:   Physical Exam  Constitutional: He appears well-nourished.    Appears uncomfortable during exam  Cardiovascular: Normal rate and regular rhythm.   Pulmonary/Chest: Breath sounds normal.  Abdominal: Soft. Bowel sounds are normal. There is no tenderness.  Moderate CVA tenderness to right flank  Skin: Skin is warm and dry.          Assessment & Plan:  Flank pain:  Present to right side since Monday this week, worsening pain. Moderate right CVA tenderness. Suspect renal stone as he does not drink water and also due to presentation. UA: Negative for blood, leuks, nitrites. KUB today. RX for flomax and hydrocodone PRN, Push fluids ER precautions provided for weekend. Strainer provided to take home. Follow up PRN.

## 2014-11-05 NOTE — Patient Instructions (Addendum)
Complete xray(s) prior to leaving today. I will contact you regarding your results.  Take Flomax once daily to help facilitate removal of suspected kidney stone.  You may take the hydrocodone once every 6 hours as needed for moderate to severe pain.  Use the strainer when you urinate to catch a potential stone.  Drink at least 2 liters of water daily.  If you develop increased pain over the weekend with fevers, vomiting, or blood in your urine, then go to the emergency department immediately.  It was a pleasure meeting you!  Kidney Stones Kidney stones (urolithiasis) are deposits that form inside your kidneys. The intense pain is caused by the stone moving through the urinary tract. When the stone moves, the ureter goes into spasm around the stone. The stone is usually passed in the urine.  CAUSES   A disorder that makes certain neck glands produce too much parathyroid hormone (primary hyperparathyroidism).  A buildup of uric acid crystals, similar to gout in your joints.  Narrowing (stricture) of the ureter.  A kidney obstruction present at birth (congenital obstruction).  Previous surgery on the kidney or ureters.  Numerous kidney infections. SYMPTOMS   Feeling sick to your stomach (nauseous).  Throwing up (vomiting).  Blood in the urine (hematuria).  Pain that usually spreads (radiates) to the groin.  Frequency or urgency of urination. DIAGNOSIS   Taking a history and physical exam.  Blood or urine tests.  CT scan.  Occasionally, an examination of the inside of the urinary bladder (cystoscopy) is performed. TREATMENT   Observation.  Increasing your fluid intake.  Extracorporeal shock wave lithotripsy--This is a noninvasive procedure that uses shock waves to break up kidney stones.  Surgery may be needed if you have severe pain or persistent obstruction. There are various surgical procedures. Most of the procedures are performed with the use of small  instruments. Only small incisions are needed to accommodate these instruments, so recovery time is minimized. The size, location, and chemical composition are all important variables that will determine the proper choice of action for you. Talk to your health care provider to better understand your situation so that you will minimize the risk of injury to yourself and your kidney.  HOME CARE INSTRUCTIONS   Drink enough water and fluids to keep your urine clear or pale yellow. This will help you to pass the stone or stone fragments.  Strain all urine through the provided strainer. Keep all particulate matter and stones for your health care provider to see. The stone causing the pain may be as small as a grain of salt. It is very important to use the strainer each and every time you pass your urine. The collection of your stone will allow your health care provider to analyze it and verify that a stone has actually passed. The stone analysis will often identify what you can do to reduce the incidence of recurrences.  Only take over-the-counter or prescription medicines for pain, discomfort, or fever as directed by your health care provider.  Make a follow-up appointment with your health care provider as directed.  Get follow-up X-rays if required. The absence of pain does not always mean that the stone has passed. It may have only stopped moving. If the urine remains completely obstructed, it can cause loss of kidney function or even complete destruction of the kidney. It is your responsibility to make sure X-rays and follow-ups are completed. Ultrasounds of the kidney can show blockages and the status of the  kidney. Ultrasounds are not associated with any radiation and can be performed easily in a matter of minutes. SEEK MEDICAL CARE IF:  You experience pain that is progressive and unresponsive to any pain medicine you have been prescribed. SEEK IMMEDIATE MEDICAL CARE IF:   Pain cannot be controlled  with the prescribed medicine.  You have a fever or shaking chills.  The severity or intensity of pain increases over 18 hours and is not relieved by pain medicine.  You develop a new onset of abdominal pain.  You feel faint or pass out.  You are unable to urinate. MAKE SURE YOU:   Understand these instructions.  Will watch your condition.  Will get help right away if you are not doing well or get worse. Document Released: 03/19/2005 Document Revised: 11/19/2012 Document Reviewed: 08/20/2012 [facility] Patient Information 2015 [place], [place]. This information is not intended to replace advice given to you by your health care provider. Make sure you discuss any questions you have with your health care provider.

## 2014-11-05 NOTE — Telephone Encounter (Signed)
Pt has appt 11/05/14 at 10:45 with Allie Bossier NP.

## 2014-11-05 NOTE — Telephone Encounter (Signed)
Patient Name: Antonio Russell  DOB: Sep 19, 1960    Initial Comment Caller states husband c/o right lower abdominal pain   Nurse Assessment  Nurse: Mallie Mussel, RN, Alveta Heimlich Date/Time Eilene Ghazi Time): 11/05/2014 9:40:48 AM  Confirm and document reason for call. If symptomatic, describe symptoms. ---Caller is not with her husband. 3 way call offered. [phone]. The patient states his pain is in his right lower rib cage area and moves around to the front. This has been bothering him all week and has continued to get worse. He rates his pain as 6 on 0-10 scale. At times, it has been worse. Denies blood in his urine. There is a slight burning with pain. Denies fever.  Has the patient traveled out of the country within the last 30 days? ---No  Does the patient require triage? ---Yes  Related visit to physician within the last 2 weeks? ---No  Does the PT have any chronic conditions? (i.e. diabetes, asthma, etc.) ---Yes  List chronic conditions. ---Hypercholesterolemia, HTN     Guidelines    Guideline Title Affirmed Question Affirmed Notes  Flank Pain Pain or burning with urination    Final Disposition User   See Physician within 4 Hours (or PCP triage) Mallie Mussel, RN, Alveta Heimlich    Comments  Appointment scheduled for 10:45am today with Alma Friendly NP   Referrals  REFERRED TO PCP OFFICE   Disagree/Comply: Comply

## 2014-11-08 ENCOUNTER — Telehealth: Payer: Self-pay | Admitting: Primary Care

## 2014-11-08 DIAGNOSIS — R1011 Right upper quadrant pain: Secondary | ICD-10-CM

## 2014-11-08 NOTE — Telephone Encounter (Signed)
Called and spoken to patient. Patient stated not that much better. Patient has been more nausea and the pain is still there at the same spot. Patient stated that could it be possible the gallbladder. Patient request ultrasound. Patient has been burping more, more painful after he eats, and having diarrhea. Please advise.

## 2014-11-08 NOTE — Telephone Encounter (Signed)
Spoke with patient. His pain is currently located to the right flank with radiation to RUQ of abdomen over the weekend. He's also experienced nausea with an episode of vomiting, and reports his pain is worse with eating and drinking. Will schedule abdominal ultrasound to RUQ to rule out cholelithiasis.

## 2014-11-08 NOTE — Telephone Encounter (Signed)
Will you please call Mr. Antonio Russell and find out how he's feeling? Any decrease in pain? Thanks.

## 2014-11-09 ENCOUNTER — Other Ambulatory Visit: Payer: Self-pay | Admitting: Primary Care

## 2014-11-09 ENCOUNTER — Ambulatory Visit
Admission: RE | Admit: 2014-11-09 | Discharge: 2014-11-09 | Disposition: A | Discharge status: Home / Self Care | Payer: Managed Care, Other (non HMO) | Source: Ambulatory Visit | Attending: Primary Care | Admitting: Primary Care

## 2014-11-09 DIAGNOSIS — R1011 Right upper quadrant pain: Secondary | ICD-10-CM

## 2014-11-10 ENCOUNTER — Telehealth: Payer: Self-pay | Admitting: Radiology

## 2014-11-10 ENCOUNTER — Other Ambulatory Visit: Payer: Self-pay | Admitting: Primary Care

## 2014-11-10 ENCOUNTER — Other Ambulatory Visit (INDEPENDENT_AMBULATORY_CARE_PROVIDER_SITE_OTHER): Payer: Managed Care, Other (non HMO)

## 2014-11-10 ENCOUNTER — Telehealth: Payer: Self-pay | Admitting: *Deleted

## 2014-11-10 DIAGNOSIS — R1011 Right upper quadrant pain: Secondary | ICD-10-CM

## 2014-11-10 LAB — LIPASE: LIPASE: 24 U/L (ref 11.0–59.0)

## 2014-11-10 LAB — CBC WITH DIFFERENTIAL/PLATELET
Basophils Absolute: 0.1 10*3/uL (ref 0.0–0.1)
Basophils Relative: 0.5 % (ref 0.0–3.0)
EOS PCT: 2.1 % (ref 0.0–5.0)
Eosinophils Absolute: 0.4 10*3/uL (ref 0.0–0.7)
HCT: 51.2 % (ref 39.0–52.0)
Hemoglobin: 17.1 g/dL — ABNORMAL HIGH (ref 13.0–17.0)
LYMPHS ABS: 3.3 10*3/uL (ref 0.7–4.0)
Lymphocytes Relative: 16.7 % (ref 12.0–46.0)
MCHC: 33.5 g/dL (ref 30.0–36.0)
MCV: 98.6 fl (ref 78.0–100.0)
MONOS PCT: 6.4 % (ref 3.0–12.0)
Monocytes Absolute: 1.3 10*3/uL — ABNORMAL HIGH (ref 0.1–1.0)
NEUTROS PCT: 74.3 % (ref 43.0–77.0)
Neutro Abs: 14.6 10*3/uL — ABNORMAL HIGH (ref 1.4–7.7)
Platelets: 298 10*3/uL (ref 150.0–400.0)
RBC: 5.19 Mil/uL (ref 4.22–5.81)
RDW: 14.8 % (ref 11.5–15.5)
WBC: 19.6 10*3/uL (ref 4.0–10.5)

## 2014-11-10 LAB — COMPREHENSIVE METABOLIC PANEL
ALK PHOS: 90 U/L (ref 39–117)
ALT: 27 U/L (ref 0–53)
AST: 20 U/L (ref 0–37)
Albumin: 4.3 g/dL (ref 3.5–5.2)
BILIRUBIN TOTAL: 0.2 mg/dL (ref 0.2–1.2)
BUN: 14 mg/dL (ref 6–23)
CO2: 26 meq/L (ref 19–32)
CREATININE: 1.13 mg/dL (ref 0.40–1.50)
Calcium: 11.3 mg/dL — ABNORMAL HIGH (ref 8.4–10.5)
Chloride: 102 mEq/L (ref 96–112)
GFR: 71.91 mL/min (ref >60.00)
GLUCOSE: 98 mg/dL (ref 70–99)
Potassium: 4 mEq/L (ref 3.5–5.1)
SODIUM: 136 meq/L (ref 135–145)
Total Protein: 7.7 g/dL (ref 6.0–8.3)

## 2014-11-10 MED ORDER — CIPROFLOXACIN HCL 500 MG PO TABS: 500.0000 mg | ORAL_TABLET | Freq: Two times a day (BID) | ORAL | Status: DC

## 2014-11-10 MED ORDER — METRONIDAZOLE 500 MG PO TABS: 500.0000 mg | ORAL_TABLET | Freq: Three times a day (TID) | ORAL | Status: DC

## 2014-11-10 NOTE — Telephone Encounter (Signed)
Noted. Spoke with patient who is to be scheduled for a stat CT scan.

## 2014-11-10 NOTE — Telephone Encounter (Signed)
[facility] call a critical result, WBC- 19.6, results given to Allie Bossier, NP

## 2014-11-10 NOTE — Telephone Encounter (Signed)
Note for work to excuse until 11/15/14 per Anda Kraft.

## 2014-11-11 ENCOUNTER — Other Ambulatory Visit: Payer: Self-pay | Admitting: Primary Care

## 2014-11-11 ENCOUNTER — Ambulatory Visit (INDEPENDENT_AMBULATORY_CARE_PROVIDER_SITE_OTHER)
Admission: RE | Admit: 2014-11-11 | Discharge: 2014-11-11 | Disposition: A | Discharge status: Home / Self Care | Payer: Managed Care, Other (non HMO) | Source: Ambulatory Visit | Attending: Primary Care | Admitting: Primary Care

## 2014-11-11 DIAGNOSIS — R1011 Right upper quadrant pain: Secondary | ICD-10-CM

## 2014-11-11 DIAGNOSIS — W57XXXA Bitten or stung by nonvenomous insect and other nonvenomous arthropods, initial encounter: Secondary | ICD-10-CM

## 2014-11-11 MED ORDER — IOHEXOL 300 MG/ML  SOLN
100.0000 mL | Freq: Once | INTRAMUSCULAR | Status: AC | PRN
  Administered 2014-11-11: 100 mL via INTRAVENOUS

## 2014-11-15 ENCOUNTER — Telehealth: Payer: Self-pay | Admitting: Radiology

## 2014-11-15 ENCOUNTER — Other Ambulatory Visit: Payer: Managed Care, Other (non HMO)

## 2014-11-15 ENCOUNTER — Other Ambulatory Visit (INDEPENDENT_AMBULATORY_CARE_PROVIDER_SITE_OTHER): Payer: Managed Care, Other (non HMO)

## 2014-11-15 DIAGNOSIS — R1011 Right upper quadrant pain: Secondary | ICD-10-CM | POA: Diagnosis not present

## 2014-11-15 DIAGNOSIS — D611 Drug-induced aplastic anemia: Secondary | ICD-10-CM

## 2014-11-15 DIAGNOSIS — W57XXXA Bitten or stung by nonvenomous insect and other nonvenomous arthropods, initial encounter: Secondary | ICD-10-CM

## 2014-11-15 LAB — CBC WITH DIFFERENTIAL/PLATELET
Basophils Absolute: 0.1 10*3/uL (ref 0.0–0.1)
Basophils Relative: 0.5 % (ref 0.0–3.0)
EOS PCT: 2.8 % (ref 0.0–5.0)
Eosinophils Absolute: 0.5 10*3/uL (ref 0.0–0.7)
HCT: 49.1 % (ref 39.0–52.0)
Hemoglobin: 16.3 g/dL (ref 13.0–17.0)
LYMPHS ABS: 4.6 10*3/uL — AB (ref 0.7–4.0)
Lymphocytes Relative: 25 % (ref 12.0–46.0)
MCHC: 33.3 g/dL (ref 30.0–36.0)
MCV: 98.3 fl (ref 78.0–100.0)
MONO ABS: 1.6 10*3/uL — AB (ref 0.1–1.0)
Monocytes Relative: 9 % (ref 3.0–12.0)
NEUTROS ABS: 11.4 10*3/uL — AB (ref 1.4–7.7)
NEUTROS PCT: 62.7 % (ref 43.0–77.0)
PLATELETS: 279 10*3/uL (ref 150.0–400.0)
RBC: 5 Mil/uL (ref 4.22–5.81)
RDW: 14.6 % (ref 11.5–15.5)
WBC: 18.3 10*3/uL (ref 4.0–10.5)

## 2014-11-15 NOTE — Telephone Encounter (Signed)
Noted. Will add on pathologist smear for further investigation. Will notify patient of results once complete.

## 2014-11-15 NOTE — Telephone Encounter (Signed)
[facility] called a critical WBC, - 18.3. Results given to Allie Bossier, NP

## 2014-11-16 LAB — PATHOLOGIST SMEAR REVIEW

## 2014-11-16 LAB — B. BURGDORFI ANTIBODIES: B BURGDORFERI AB IGG+ IGM: 0.18 {ISR}

## 2014-11-17 ENCOUNTER — Other Ambulatory Visit: Payer: Managed Care, Other (non HMO)

## 2014-11-19 ENCOUNTER — Other Ambulatory Visit (INDEPENDENT_AMBULATORY_CARE_PROVIDER_SITE_OTHER): Payer: Managed Care, Other (non HMO)

## 2014-11-19 DIAGNOSIS — D72829 Elevated white blood cell count, unspecified: Secondary | ICD-10-CM

## 2014-11-19 LAB — CBC WITH DIFFERENTIAL/PLATELET
Basophils Absolute: 0.1 10*3/uL (ref 0.0–0.1)
Basophils Relative: 1 % (ref 0–1)
EOS ABS: 0.5 10*3/uL (ref 0.0–0.7)
Eosinophils Relative: 4 % (ref 0–5)
HEMATOCRIT: 47.9 % (ref 39.0–52.0)
Hemoglobin: 16.4 g/dL (ref 13.0–17.0)
LYMPHS PCT: 28 % (ref 12–46)
Lymphs Abs: 3.4 10*3/uL (ref 0.7–4.0)
MCH: 33 pg (ref 26.0–34.0)
MCHC: 34.2 g/dL (ref 30.0–36.0)
MCV: 96.4 fL (ref 78.0–100.0)
MONO ABS: 1.2 10*3/uL — AB (ref 0.1–1.0)
MONOS PCT: 10 % (ref 3–12)
MPV: 11.2 fL (ref 8.6–12.4)
Neutro Abs: 6.9 10*3/uL (ref 1.7–7.7)
Neutrophils Relative %: 57 % (ref 43–77)
Platelets: 298 10*3/uL (ref 150–400)
RBC: 4.97 MIL/uL (ref 4.22–5.81)
RDW: 14 % (ref 11.5–15.5)
WBC: 12.1 10*3/uL — ABNORMAL HIGH (ref 4.0–10.5)

## 2014-11-25 ENCOUNTER — Telehealth: Payer: Self-pay

## 2014-11-25 NOTE — Telephone Encounter (Signed)
Please make a f/u appt if not already done

## 2014-11-25 NOTE — Telephone Encounter (Signed)
Pt saw lab results on my chart and called back; pt had slight fever few nights ago but known since; pt feels little better but is concerned that wbc will go back up; pt wants to know if needs f/u appt with Dr Glori Bickers to make sure infection is gone and pt has developed an external hemorrhoid, no bleeding. Pt request cb.

## 2014-11-26 NOTE — Telephone Encounter (Signed)
Left voicemail letting pt know Dr. Glori Bickers does want him to schedule a f/u appt with her

## 2014-11-30 ENCOUNTER — Encounter: Payer: Self-pay | Admitting: Family Medicine

## 2014-11-30 ENCOUNTER — Ambulatory Visit (INDEPENDENT_AMBULATORY_CARE_PROVIDER_SITE_OTHER): Payer: Managed Care, Other (non HMO) | Admitting: Family Medicine

## 2014-11-30 VITALS — BP 148/92 | HR 100 | Temp 98.5°F | Ht 71.0 in | Wt 197.8 lb

## 2014-11-30 DIAGNOSIS — I10 Essential (primary) hypertension: Secondary | ICD-10-CM

## 2014-11-30 DIAGNOSIS — R1011 Right upper quadrant pain: Secondary | ICD-10-CM | POA: Insufficient documentation

## 2014-11-30 DIAGNOSIS — D72829 Elevated white blood cell count, unspecified: Secondary | ICD-10-CM | POA: Diagnosis not present

## 2014-11-30 NOTE — Patient Instructions (Addendum)
Repeat blood count (to check white blood cells) today  I'm glad your abdominal pain is better Eat a healthy diet  Drink enough fluids  Get back on your blood pressure medicine  Keep working on quitting smoking

## 2014-11-30 NOTE — Progress Notes (Signed)
Subjective:    Patient ID: Antonio Russell, male    DOB: [birth year], 53 y.o.   MRN: [id number]  HPI Here for f/u of R sided abd/flank pain and leukocytosis  Started as flank pain  ua neg and KUB neg -for kidney stone  Then cbc showed wbc of 19.6, repeat 18.3 (hx of high nl wbc in the past -not this high)  abd [place] nl  CT of abd /pelvis nl  Started on empiric abx -cipro and flagyl   Lab Results  Component Value Date   WBC 12.1* 11/19/2014   HGB 16.4 11/19/2014   HCT 47.9 11/19/2014   MCV 96.4 11/19/2014   PLT 298 11/19/2014    Last colonoscopy - 11/15 had polyps   Now overall improved but still tired  Hemorrhoids flared up after abx (loose stool)  Is done with abx now - and stools are getting back to normal    (has had hemorrhoid surgery in the past)   Abdominal pain seems to have gone away   Is not taking flomax   No change in urination  (he did have some burning just before starting abx)   Patient Active Problem List   Diagnosis Date Noted  . Leukocytosis 11/30/2014  . Abdominal pain, right upper quadrant 11/30/2014  . Arm pain, left 05/08/2011  . Special screening for malignant neoplasms, colon 02/16/2011  . Right leg pain 01/08/2011  . Tachycardia 08/10/2010  . Back pain   . Arthritis   . Essential hypertension 07/13/2010  . CAD (coronary artery disease)   . HYPOGONADISM 05/25/2009  . HYPERLIPIDEMIA 05/25/2009  . 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 RENAL CALCULUS, HX OF 05/11/2009  . TOBACCO USE 09/12/2007  . LOSS, HEARING NOS 10/10/2006   Past Medical History  Diagnosis Date  . Impotence of organic origin   . HLD (hyperlipidemia)   . Other testicular hypofunction   . Unspecified hearing loss   . Personal history of urinary calculi   . Tobacco use disorder   . Headache(784.0)   . Anxiety   . Allergic rhinitis   . Chest pain 2012    cardiac cath - nonobstructive, nl LV fxn, rec aggressive med management  . Back pain   . Arthritis    Past  Surgical History  Procedure Laterality Date  . Colonoscopy    . Hemorrhoid surgery    . Nasal septum surgery    . Knee surgery Right 2013   Social History  Substance Use Topics  . Smoking status: Current Every Day Smoker -- 1.00 packs/day for 35 years    Types: Cigarettes  . Smokeless tobacco: Former Systems developer     Comment: just recently decreased from 1 PPD-purchased electronic cigarette  . Alcohol Use: 0.0 oz/week    0 Standard drinks or equivalent per week     Comment: 6 pack 3 times weekly   Family History  Problem Relation Age of Onset  . Lung cancer      GF  . Heart disease      GM  . Hypertension Mother   . Hyperlipidemia Mother   . Diabetes Mother   . Hypertension Father   . Hyperlipidemia Father   . Hypertension Sister   . Esophageal cancer Paternal Uncle   . Colon cancer Neg Hx   . Stomach cancer Neg Hx   . Rectal cancer Neg Hx    Allergies  Allergen Reactions  . Crestor [Rosuvastatin Calcium]     Muscle aches  .  Lipitor [Atorvastatin Calcium]     Arm soreness   . Sertraline Hcl     REACTION: muscle twitches  . Zocor [Simvastatin - High Dose]     Muscle pain    Current Outpatient Prescriptions on File Prior to Visit  Medication Sig Dispense Refill  . aspirin 81 MG tablet Take 81 mg by mouth daily.      . cetirizine (ZYRTEC) 10 MG tablet Take 10 mg by mouth as needed for allergies.    [name] ergocalciferol (VITAMIN D2) 50000 UNITS capsule Take 50,000 Units by mouth once a week.    . fluticasone (FLONASE) 50 MCG/ACT nasal spray 2 sprays by Nasal route daily as needed.      . testosterone cypionate (DEPOTESTOTERONE CYPIONATE) 100 MG/ML injection Inject into the muscle every 28 (twenty-eight) days. For IM use only    . zolpidem (AMBIEN CR) 12.5 MG CR tablet   5   No current facility-administered medications on file prior to visit.    Review of Systems Review of Systems  Constitutional: Negative for fever, appetite change,  and unexpected weight change. pos for  fatigue  Eyes: Negative for pain and visual disturbance.  Respiratory: Negative for cough and shortness of breath.   Cardiovascular: Negative for cp or palpitations    Gastrointestinal: Negative for nausea, diarrhea and constipation. pos for hemorrhoids  Genitourinary: Negative for urgency and frequency.  Skin: Negative for pallor or rash   Neurological: Negative for weakness, light-headedness, numbness and headaches.  Hematological: Negative for adenopathy. Does not bruise/bleed easily.  Psychiatric/Behavioral: Negative for dysphoric mood. The patient is not nervous/anxious.         Objective:   Physical Exam  Constitutional: He appears well-developed and well-nourished. No distress.  Well appearing   HENT:  Head: Normocephalic and atraumatic.  Mouth/Throat: Oropharynx is clear and moist.  Eyes: Conjunctivae and EOM are normal. Pupils are equal, round, and reactive to light. No scleral icterus.  Neck: Normal range of motion. Neck supple.  Cardiovascular: Normal rate, regular rhythm and normal heart sounds.   Pulmonary/Chest: Effort normal and breath sounds normal. No respiratory distress. He has no wheezes. He has no rales.  Abdominal: Soft. Normal appearance and bowel sounds are normal. He exhibits no distension, no abdominal bruit, no pulsatile midline mass and no mass. There is no hepatosplenomegaly. There is no tenderness. There is no rebound, no guarding and no CVA tenderness.  No suprapubic tenderness or fullness     Musculoskeletal: He exhibits no edema.  Lymphadenopathy:    He has no cervical adenopathy.  Neurological: He is alert.  Skin: Skin is warm and dry. No erythema. No pallor.  Psychiatric: He has a normal mood and affect.          Assessment & Plan:   Problem List Items Addressed This Visit    Abdominal pain, right upper quadrant    Resolved after course of cipro and flagyl (after wbc bumped with L shift)  Ref [place] and CT -no findings  If symptoms return  will plan on specialist ref  Re check wbc today      Essential hypertension    Pt ran out of med - 4 d -on his way to pick it up  BP Readings from Last 3 Encounters:  11/30/14 148/92  11/05/14 126/84  02/05/14 129/92   usually well controlled on losartan Will plan f/u      Relevant Medications   losartan (COZAAR) 50 MG tablet   Leukocytosis - Primary  Pt has baseline wbc slt higher than ref range, but with recent bout of abd pain it went over 19 (L shift/rev path smear) Improved Re check again today s/p tx with cipro and flagyl abd pain resolved Will continue to follow       Relevant Orders   CBC with Differential/Platelet

## 2014-11-30 NOTE — Assessment & Plan Note (Signed)
Pt ran out of med - 4 d -on his way to pick it up  BP Readings from Last 3 Encounters:  11/30/14 148/92  11/05/14 126/84  02/05/14 129/92   usually well controlled on losartan Will plan f/u

## 2014-11-30 NOTE — Progress Notes (Signed)
Pre visit review using our clinic review tool, if applicable. No additional management support is needed unless otherwise documented below in the visit note. 

## 2014-11-30 NOTE — Assessment & Plan Note (Signed)
Pt has baseline wbc slt higher than ref range, but with recent bout of abd pain it went over 19 (L shift/rev path smear) Improved Re check again today s/p tx with cipro and flagyl abd pain resolved Will continue to follow

## 2014-11-30 NOTE — Telephone Encounter (Signed)
Pt scheduled fu appt.

## 2014-11-30 NOTE — Assessment & Plan Note (Signed)
Resolved after course of cipro and flagyl (after wbc bumped with L shift)  Ref [place] and CT -no findings  If symptoms return will plan on specialist ref  Re check wbc today

## 2014-12-01 LAB — CBC WITH DIFFERENTIAL/PLATELET
BASOS PCT: 1.1 % (ref 0.0–3.0)
Basophils Absolute: 0.2 10*3/uL — ABNORMAL HIGH (ref 0.0–0.1)
EOS ABS: 0.7 10*3/uL (ref 0.0–0.7)
EOS PCT: 5.1 % — AB (ref 0.0–5.0)
HCT: 48.8 % (ref 39.0–52.0)
HEMOGLOBIN: 16.4 g/dL (ref 13.0–17.0)
Lymphocytes Relative: 29.7 % (ref 12.0–46.0)
Lymphs Abs: 4.2 10*3/uL — ABNORMAL HIGH (ref 0.7–4.0)
MCHC: 33.7 g/dL (ref 30.0–36.0)
MCV: 98.1 fl (ref 78.0–100.0)
Monocytes Absolute: 1.5 10*3/uL — ABNORMAL HIGH (ref 0.1–1.0)
Monocytes Relative: 10.5 % (ref 3.0–12.0)
Neutro Abs: 7.6 10*3/uL (ref 1.4–7.7)
Neutrophils Relative %: 53.6 % (ref 43.0–77.0)
Platelets: 280 10*3/uL (ref 150.0–400.0)
RBC: 4.97 Mil/uL (ref 4.22–5.81)
RDW: 14.4 % (ref 11.5–15.5)
WBC: 14.1 10*3/uL — AB (ref 4.0–10.5)

## 2014-12-03 ENCOUNTER — Telehealth: Payer: Self-pay | Admitting: Family Medicine

## 2014-12-03 DIAGNOSIS — D72829 Elevated white blood cell count, unspecified: Secondary | ICD-10-CM

## 2014-12-03 NOTE — Addendum Note (Signed)
Addended by: Loura Pardon A on: 12/03/2014 01:27 PM   Modules accepted: Orders

## 2014-12-03 NOTE — Telephone Encounter (Signed)
Patient called to let Dr.Tower know he does want to be referred to a Hematologist.  Patient wants to go to [place]. If possible, Patient would like appointment after 2:30.

## 2014-12-03 NOTE — Telephone Encounter (Signed)
Will refer and route to [place]  

## 2014-12-19 ENCOUNTER — Telehealth: Payer: Self-pay | Admitting: Family Medicine

## 2014-12-19 DIAGNOSIS — D72829 Elevated white blood cell count, unspecified: Secondary | ICD-10-CM

## 2014-12-19 NOTE — Telephone Encounter (Signed)
New ref to hematology

## 2014-12-20 ENCOUNTER — Telehealth: Payer: Self-pay | Admitting: Oncology

## 2014-12-20 NOTE — Telephone Encounter (Signed)
new patient appt-s/w patient and gave np appt for 09/28 @ 1:30 w/Dr. Alen Blew.  Referring Dr. Loura Pardon Dx-Leukocytosis

## 2014-12-27 ENCOUNTER — Ambulatory Visit: Payer: Managed Care, Other (non HMO)

## 2014-12-28 ENCOUNTER — Telehealth: Payer: Self-pay | Admitting: *Deleted

## 2014-12-28 NOTE — Telephone Encounter (Signed)
Left message on patient's cell phone reminding him of his appointment tomorrow at 2:00 pm with Dr. Alen Blew.

## 2014-12-29 ENCOUNTER — Other Ambulatory Visit: Payer: Managed Care, Other (non HMO)

## 2014-12-29 ENCOUNTER — Ambulatory Visit (HOSPITAL_BASED_OUTPATIENT_CLINIC_OR_DEPARTMENT_OTHER): Payer: Managed Care, Other (non HMO)

## 2014-12-29 ENCOUNTER — Ambulatory Visit (HOSPITAL_BASED_OUTPATIENT_CLINIC_OR_DEPARTMENT_OTHER): Payer: Managed Care, Other (non HMO) | Admitting: Oncology

## 2014-12-29 ENCOUNTER — Telehealth: Payer: Self-pay | Admitting: Oncology

## 2014-12-29 VITALS — BP 130/79 | HR 97 | Temp 98.7°F | Resp 18 | Ht 71.0 in | Wt 193.7 lb

## 2014-12-29 DIAGNOSIS — D72829 Elevated white blood cell count, unspecified: Secondary | ICD-10-CM

## 2014-12-29 LAB — CBC WITH DIFFERENTIAL/PLATELET
BASO%: 0.3 % (ref 0.0–2.0)
BASOS ABS: 0 10*3/uL (ref 0.0–0.1)
EOS ABS: 0.5 10*3/uL (ref 0.0–0.5)
EOS%: 4.2 % (ref 0.0–7.0)
HEMATOCRIT: 48.7 % (ref 38.4–49.9)
HGB: 16.3 g/dL (ref 13.0–17.1)
LYMPH#: 3.8 10*3/uL — AB (ref 0.9–3.3)
LYMPH%: 30.7 % (ref 14.0–49.0)
MCH: 32.3 pg (ref 27.2–33.4)
MCHC: 33.6 g/dL (ref 32.0–36.0)
MCV: 96.3 fL (ref 79.3–98.0)
MONO#: 1.2 10*3/uL — ABNORMAL HIGH (ref 0.1–0.9)
MONO%: 10.2 % (ref 0.0–14.0)
NEUT#: 6.7 10*3/uL — ABNORMAL HIGH (ref 1.5–6.5)
NEUT%: 54.6 % (ref 39.0–75.0)
Platelets: 256 10*3/uL (ref 140–400)
RBC: 5.06 10*6/uL (ref 4.20–5.82)
RDW: 13.2 % (ref 11.0–14.6)
WBC: 12.2 10*3/uL — ABNORMAL HIGH (ref 4.0–10.3)

## 2014-12-29 NOTE — Consult Note (Signed)
Reason for Referral: Leukocytosis.   HPI: 54 year old gentleman with history of heavy tobacco use as well as anxiety and mild hypertension referred to me for evaluation of leukocytosis. He was in his usual state of health when he presented with flank pain and abdominal pain in August 2016. CBC at that time obtained by his primary care provider showed a white cell count of 19.6 with a normal differential. He had a normal hemoglobin and platelet count. At that time his workup was unrevealing including a CT scan of the abdomen and pelvis as well as an ultrasound. He was treated with anti-biotics including Cipro and Flagyl with improvement in his symptoms. Repeat white cell count on 11/15/2014 was down to 18.3 and on 11/19/2014 was down to 12.1. On 11/30/2014 his white cell count was 14.1 with mild eosinophilia. His eosinophil percentage was 5.1 with an absolute eosinophil count was normal at 0.7. Looking back historically his white cell count was elevated in April 2012 with a white cell count of 11.5. That was as high as 15.6 and April 2012. Clinically she does have vague constellation of symptoms including occasional headaches, fatigue and anxiety. Otherwise he is asymptomatic. He is able to work full time and attends to his family. He has not reported any lymphadenopathy or fevers. Has not reported any easy bruisability.  He does not report any blurry vision, syncope or seizures. He does not report any fevers, chills or sweats. His weight have been stable with excellent appetite. He does not report any chest pain, palpitation, orthopnea or leg edema. He does not report any cough, hemoptysis or wheezing. Does not report any nausea, vomiting or abdominal pain. He does not report any constipation or diarrhea. Does not report any frequency urgency or hesitancy. Does not report any skeletal complaints. Remaining review of systems unremarkable.   Past Medical History  Diagnosis Date  . Impotence of organic origin    . HLD (hyperlipidemia)   . Other testicular hypofunction   . Unspecified hearing loss   . Personal history of urinary calculi   . Tobacco use disorder   . Headache(784.0)   . Anxiety   . Allergic rhinitis   . Chest pain 2012    cardiac cath - nonobstructive, nl LV fxn, rec aggressive med management  . Back pain   . Arthritis   :  Past Surgical History  Procedure Laterality Date  . Colonoscopy    . Hemorrhoid surgery    . Nasal septum surgery    . Knee surgery Right 2013  :   Current outpatient prescriptions:  .  aspirin 81 MG tablet, Take 81 mg by mouth daily.  , Disp: , Rfl:  .  cetirizine (ZYRTEC) 10 MG tablet, Take 10 mg by mouth as needed for allergies., Disp: , Rfl:  .  ergocalciferol (VITAMIN D2) 50000 UNITS capsule, Take 50,000 Units by mouth once a week., Disp: , Rfl:  .  fluticasone (FLONASE) 50 MCG/ACT nasal spray, 2 sprays by Nasal route daily as needed.  , Disp: , Rfl:  .  losartan (COZAAR) 50 MG tablet, Take 50 mg by mouth daily., Disp: , Rfl:  .  testosterone cypionate (DEPOTESTOTERONE CYPIONATE) 100 MG/ML injection, Inject into the muscle every 28 (twenty-eight) days. For IM use only, Disp: , Rfl:  .  zolpidem (AMBIEN CR) 12.5 MG CR tablet, , Disp: , Rfl: 5:  Allergies  Allergen Reactions  . Crestor [Rosuvastatin Calcium] Other (See Comments)    Muscle aches  . Lipitor [employer]  Calcium] Other (See Comments)    Arm soreness   . Sertraline Hcl Other (See Comments)    REACTION: muscle twitches  . Zocor [Simvastatin - High Dose] Other (See Comments)    Muscle pain   :  Family History  Problem Relation Age of Onset  . Lung cancer      GF  . Heart disease      GM  . Hypertension Mother   . Hyperlipidemia Mother   . Diabetes Mother   . Hypertension Father   . Hyperlipidemia Father   . Hypertension Sister   . Esophageal cancer Paternal Uncle   . Colon cancer Neg Hx   . Stomach cancer Neg Hx   . Rectal cancer Neg Hx   :  Social History    Social History  . Marital Status: Married    Spouse Name: N/A  . Number of Children: N/A  . Years of Education: N/A   Occupational History  . Not on file.   Social History Main Topics  . Smoking status: Current Every Day Smoker -- 1.00 packs/day for 35 years    Types: Cigarettes  . Smokeless tobacco: Former Systems developer     Comment: just recently decreased from 1 PPD-purchased electronic cigarette  . Alcohol Use: 0.0 oz/week    0 Standard drinks or equivalent per week     Comment: 6 pack 3 times weekly  . Drug Use: No  . Sexual Activity: Not on file   Other Topics Concern  . Not on file   Social History Narrative   Coaches basketball  :  Pertinent items are noted in HPI.  Exam: Blood pressure 130/79, pulse 97, temperature 98.7 F (37.1 C), temperature source Oral, resp. rate 18, height 5\' 11"  (1.803 m), weight 193 lb 11.2 oz (87.862 kg), SpO2 99 %. General appearance: alert and cooperative Head: Normocephalic, without obvious abnormality Throat: lips, mucosa, and tongue normal; teeth and gums normal Neck: no adenopathy Back: negative Resp: clear to auscultation bilaterally Chest wall: no tenderness Cardio: regular rate and rhythm, S1, S2 normal, no murmur, click, rub or gallop GI: soft, non-tender; bowel sounds normal; no masses,  no organomegaly Extremities: extremities normal, atraumatic, no cyanosis or edema Pulses: 2+ and symmetric Skin: Skin color, texture, turgor normal. No rashes or lesions Lymph nodes: Cervical, supraclavicular, and axillary nodes normal. Neurologic: Grossly normal  CBC    Component Value Date/Time   WBC 14.1* 11/30/2014 1552   RBC 4.97 11/30/2014 1552   HGB 16.4 11/30/2014 1552   HCT 48.8 11/30/2014 1552   PLT 280.0 11/30/2014 1552   MCV 98.1 11/30/2014 1552   MCH 33.0 11/19/2014 1626   MCHC 33.7 11/30/2014 1552   RDW 14.4 11/30/2014 1552   LYMPHSABS 4.2* 11/30/2014 1552   MONOABS 1.5* 11/30/2014 1552   EOSABS 0.7 11/30/2014 1552    BASOSABS 0.2* 11/30/2014 1552      Chemistry      Component Value Date/Time   NA 136 11/10/2014 1516   K 4.0 11/10/2014 1516   CL 102 11/10/2014 1516   CO2 26 11/10/2014 1516   BUN 14 11/10/2014 1516   CREATININE 1.13 11/10/2014 1516      Component Value Date/Time   CALCIUM 11.3* 11/10/2014 1516   ALKPHOS 90 11/10/2014 1516   AST 20 11/10/2014 1516   ALT 27 11/10/2014 1516   BILITOT 0.2 11/10/2014 1516     EXAM: CT ABDOMEN AND PELVIS WITH CONTRAST  TECHNIQUE: Multidetector CT imaging of the abdomen and  pelvis was performed using the standard protocol following bolus administration of intravenous contrast.  CONTRAST: 152mL OMNIPAQUE IOHEXOL 300 MG/ML SOLN  COMPARISON: Eft  FINDINGS: Lung bases are unremarkable. Sagittal images of the spine shows disc space flattening vacuum disc phenomenon mild anterior and mild posterior spurring at L5-S1 level.  Enhanced liver is unremarkable. Gallbladder is contracted without evidence of calcified gallstones. Mild atherosclerotic plaques and calcifications are noted distal abdominal aorta and iliac arteries.  There is a cyst in left hepatic lobe measures 7.5 mm.  The pancreas, spleen and adrenal glands are unremarkable. Kidneys are symmetrical in size and enhancement. No hydronephrosis or hydroureter. Moderate stool noted within cecum. The terminal ileum is unremarkable. No pericecal inflammation. The appendix is not identified.  There is no small bowel obstruction. No thickened or dilated small bowel loops. No ascites or free air. No adenopathy. Mild distended urinary bladder. Prostate gland and seminal vesicles are unremarkable. There is no inguinal adenopathy. No destructive bony lesions are noted within pelvis. Mild degenerative changes bilateral SI joints.  Delayed renal images shows bilateral renal symmetrical excretion. Bilateral visualized proximal ureter is unremarkable.  IMPRESSION: 1. There is no  acute inflammatory process within abdomen or pelvis. 2. No hydronephrosis or hydroureter. Bilateral renal symmetrical excretion. 3. Moderate stool noted within cecum. No pericecal inflammation. The appendix is not identified. 4. No small bowel obstruction. 5. Degenerative changes lumbar spine and bilateral SI joints.   Assessment and Plan:   54 year old gentleman with the following issues:  1. Leukocytosis with normal differential, normal hemoglobin and platelet count. His white cell count of fluctuating between 12-15,000 dating back to 2012. Most recently his count was up to 19,000 and regressed after her brief treatment with anti-biotics. The differential diagnosis was discussed with the patient and his wife today.  Secondary causes is the most likely etiology at this time. Condition like smoking, infection, stress, anxiety could be contributing to his chronic leukocytosis. It is very possible that he could have an occult infection that have exacerbated his symptoms. Leukocytosis due to malignancy such as lung cancer or GI cancer is unlikely. He had imaging studies including CT scan of the abdomen and pelvis as well as ultrasound which I have personally reviewed and showed no evidence of malignancy.  Primary hematological condition such as chronic myelogenous leukemia or myelofibrosis extremely unlikely. But given his age and chronic leukocytosis we will rule out chronic myelogenous leukemia with molecular studies today.  Once primary hematological conditions are ruled out, I think we are dealing with secondary causes. I have recommended cutting down on smoking significantly and treating his anxiety as he is right now. I see no intervention is needed at this time.  2. Age-appropriate cancer screening: He is up-to-date at this time and he completed a colonoscopy back in November 2015.  All his questions are answered today and I will be happy to see him in the future as needed. This obviously  will change if he has abnormal screening for CML.

## 2014-12-29 NOTE — Telephone Encounter (Signed)
gv and printed avs

## 2014-12-29 NOTE — Progress Notes (Signed)
Please see consult note.  

## 2015-02-23 ENCOUNTER — Ambulatory Visit (HOSPITAL_BASED_OUTPATIENT_CLINIC_OR_DEPARTMENT_OTHER): Payer: Managed Care, Other (non HMO) | Attending: Nurse Practitioner | Admitting: *Deleted

## 2015-02-23 VITALS — Ht 72.0 in | Wt 195.0 lb

## 2015-02-23 DIAGNOSIS — R5383 Other fatigue: Secondary | ICD-10-CM | POA: Insufficient documentation

## 2015-02-23 DIAGNOSIS — G4733 Obstructive sleep apnea (adult) (pediatric): Secondary | ICD-10-CM | POA: Insufficient documentation

## 2015-02-23 DIAGNOSIS — R0683 Snoring: Secondary | ICD-10-CM | POA: Insufficient documentation

## 2015-02-23 DIAGNOSIS — G4719 Other hypersomnia: Secondary | ICD-10-CM | POA: Insufficient documentation

## 2015-03-05 DIAGNOSIS — G4733 Obstructive sleep apnea (adult) (pediatric): Secondary | ICD-10-CM | POA: Diagnosis not present

## 2015-03-05 NOTE — Progress Notes (Signed)
Patient Name: Antonio Russell, Antonio Russell Date: 02/23/2015 Gender: Male D.O.B: 1960-11-14 Age (years): 54 Referring Provider: Delia Chimes Height (inches): 72 Interpreting Physician: Baird Lyons MD, ABSM Weight (lbs): 195 RPSGT: Gerhard Perches BMI: 26 MRN: [id number] Neck Size: 15.50 CLINICAL INFORMATION Sleep Study Type: Split Night CPAP   Indication for sleep study: Excessive Daytime Sleepiness, Fatigue, Snoring, Witnessed Apneas   Epworth Sleepiness Score: 11/24 SLEEP STUDY TECHNIQUE As per the AASM Manual for the Scoring of Sleep and Associated Events v2.3 (April 2016) with a hypopnea requiring 4% desaturations. The channels recorded and monitored were frontal, central and occipital EEG, electrooculogram (EOG), submentalis EMG (chin), nasal and oral airflow, thoracic and abdominal wall motion, anterior tibialis EMG, snore microphone, electrocardiogram, and pulse oximetry. Continuous positive airway pressure (CPAP) was initiated when the patient met split night criteria and was titrated according to treat sleep-disordered breathing. MEDICATIONS Medications taken by the patient : charted for review Medications administered by patient during sleep study : No sleep medicine administered.  RESPIRATORY PARAMETERS Diagnostic Total AHI (/hr): 14.8 RDI (/hr): 16.7 OA Index (/hr): 7 CA Index (/hr): 0.0 REM AHI (/hr): 0.0 NREM AHI (/hr): 16.6 Supine AHI (/hr): 20.0 Non-supine AHI (/hr): 8.26 Min O2 Sat (%): 84.00 Mean O2 (%): 92.41 Time below 88% (min): 3.1   Titration Optimal Pressure (cm): 16 AHI at Optimal Pressure (/hr): 0.0 Min O2 at Optimal Pressure (%): 94.0 Supine % at Optimal (%): 100 Sleep % at Optimal (%): 75    SLEEP ARCHITECTURE The recording time for the entire night was 405.7 minutes. During a baseline period of 219.5 minutes, the patient slept for 198.2 minutes in REM and nonREM, yielding a sleep efficiency of 90.3%. Sleep onset after lights out was 14.3 minutes with a  REM latency of 58.0 minutes. The patient spent 6.56% of the night in stage N1 sleep, 54.08% in stage N2 sleep, 28.51% in stage N3 and 10.85% in REM. During the titration period of 182.8 minutes, the patient slept for 160.0 minutes in REM and nonREM, yielding a sleep efficiency of 87.5%. Sleep onset after CPAP initiation was 3.4 minutes with a REM latency of 37.0 minutes. The patient spent 6.88% of the night in stage N1 sleep, 66.25% in stage N2 sleep, 0.00% in stage N3 and 26.88% in REM.  CARDIAC DATA The 2 lead EKG demonstrated sinus rhythm. The mean heart rate was 83.69 beats per minute. Other EKG findings include: None.  LEG MOVEMENT DATA The total Periodic Limb Movements of Sleep (PLMS) were 0. The PLMS index was 0.00 .  IMPRESSIONS - Mild obstructive sleep apnea occurred during the diagnostic portion of the study (AHI = 14.8 /hour). An optimal PAP pressure was selected for this patient ( 16 cm of water) - No significant central sleep apnea occurred during the diagnostic portion of the study (CAI = 0.0/hour). - Moderate oxygen desaturation was noted during the diagnostic portion of the study (Min O2 =84.00%). - The patient snored with Moderate snoring volume during the diagnostic portion of the study. - No cardiac abnormalities were noted during this study. - Clinically significant periodic limb movements did not occur during sleep.  DIAGNOSIS - Obstructive Sleep Apnea (327.23 [G47.33 ICD-10])  RECOMMENDATIONS - Trial of CPAP therapy on 16 cm H2O with a Medium size Fisher[employer]Paykel Full Face Mask Simplus mask and heated humidification. - Avoid alcohol, sedatives and other CNS depressants that may worsen sleep apnea and disrupt normal sleep architecture. - Sleep hygiene should be reviewed to assess factors that may improve  sleep quality. - Weight management and regular exercise should be initiated or continued.  Deneise Lever Diplomate, American Board of Sleep Medicine  ELECTRONICALLY  SIGNED ON:  03/05/2015, 10:23 AM [place] PH: [phone]   FX: [phone] [place]

## 2016-01-10 ENCOUNTER — Encounter: Payer: Self-pay | Admitting: *Deleted

## 2016-01-10 NOTE — Progress Notes (Signed)
Head & Neck Multidisciplinary Clinic Clinical Social Work  Clinical Social Work met with patient/family and radiation oncologist at head & neck multidisciplinary clinic to offer support and assess for psychosocial needs.  Mr. Ferris finished treatment 11/07/15.  The patient shared he feels he is recovering from treatment adequately.  CSW reviewed common emotional responses to cancer survivorship and encouraged patient and spouse to attend H&N Finding Your New Normal class.  Clinical Social Work encouraged patient to call with any additional questions or concerns.     Polo Riley, MSW, LCSW, OSW-C Clinical Social Worker [facility] [phone]

## 2016-10-25 ENCOUNTER — Telehealth: Payer: Self-pay | Admitting: Family Medicine

## 2016-10-25 DIAGNOSIS — Z125 Encounter for screening for malignant neoplasm of prostate: Secondary | ICD-10-CM

## 2016-10-25 DIAGNOSIS — Z0000 Encounter for general adult medical examination without abnormal findings: Secondary | ICD-10-CM

## 2016-10-25 NOTE — Telephone Encounter (Signed)
-----   Message from Ellamae Sia sent at 10/17/2016  4:00 PM EDT ----- Regarding: Lab orders for Friday, 7.27.18 .Patient is scheduled for CPX labs, please order future labs, Thanks , Karna Christmas

## 2016-10-26 ENCOUNTER — Other Ambulatory Visit (INDEPENDENT_AMBULATORY_CARE_PROVIDER_SITE_OTHER): Payer: Managed Care, Other (non HMO)

## 2016-10-26 DIAGNOSIS — Z0000 Encounter for general adult medical examination without abnormal findings: Secondary | ICD-10-CM | POA: Diagnosis not present

## 2016-10-26 DIAGNOSIS — Z125 Encounter for screening for malignant neoplasm of prostate: Secondary | ICD-10-CM | POA: Diagnosis not present

## 2016-10-26 LAB — CBC WITH DIFFERENTIAL/PLATELET
Basophils Absolute: 0.1 10*3/uL (ref 0.0–0.1)
Basophils Relative: 1.2 % (ref 0.0–3.0)
EOS PCT: 4.9 % (ref 0.0–5.0)
Eosinophils Absolute: 0.5 10*3/uL (ref 0.0–0.7)
HEMATOCRIT: 53.7 % — AB (ref 39.0–52.0)
HEMOGLOBIN: 17.7 g/dL — AB (ref 13.0–17.0)
LYMPHS PCT: 32.5 % (ref 12.0–46.0)
Lymphs Abs: 3.2 10*3/uL (ref 0.7–4.0)
MCHC: 32.9 g/dL (ref 30.0–36.0)
MCV: 102.4 fl — AB (ref 78.0–100.0)
MONOS PCT: 9.4 % (ref 3.0–12.0)
Monocytes Absolute: 0.9 10*3/uL (ref 0.1–1.0)
Neutro Abs: 5.2 10*3/uL (ref 1.4–7.7)
Neutrophils Relative %: 52 % (ref 43.0–77.0)
Platelets: 251 10*3/uL (ref 150.0–400.0)
RBC: 5.25 Mil/uL (ref 4.22–5.81)
RDW: 13.8 % (ref 11.5–15.5)
WBC: 9.9 10*3/uL (ref 4.0–10.5)

## 2016-10-26 LAB — LIPID PANEL
CHOLESTEROL: 217 mg/dL — AB (ref 0–200)
HDL: 43.6 mg/dL (ref >39.00)
LDL Cholesterol: 155 mg/dL — ABNORMAL HIGH (ref 0–99)
NonHDL: 173.61
TRIGLYCERIDES: 91 mg/dL (ref 0.0–149.0)
Total CHOL/HDL Ratio: 5
VLDL: 18.2 mg/dL (ref 0.0–40.0)

## 2016-10-26 LAB — COMPREHENSIVE METABOLIC PANEL
ALBUMIN: 4 g/dL (ref 3.5–5.2)
ALT: 16 U/L (ref 0–53)
AST: 17 U/L (ref 0–37)
Alkaline Phosphatase: 78 U/L (ref 39–117)
BUN: 10 mg/dL (ref 6–23)
CALCIUM: 11 mg/dL — AB (ref 8.4–10.5)
CO2: 29 mEq/L (ref 19–32)
Chloride: 106 mEq/L (ref 96–112)
Creatinine, Ser: 1 mg/dL (ref 0.40–1.50)
GFR: 82.2 mL/min (ref >60.00)
Glucose, Bld: 110 mg/dL — ABNORMAL HIGH (ref 70–99)
Potassium: 5 mEq/L (ref 3.5–5.1)
SODIUM: 140 meq/L (ref 135–145)
TOTAL PROTEIN: 6.7 g/dL (ref 6.0–8.3)
Total Bilirubin: 0.6 mg/dL (ref 0.2–1.2)

## 2016-10-26 LAB — TSH: TSH: 0.96 u[IU]/mL (ref 0.35–4.50)

## 2016-10-26 LAB — PSA: PSA: 0.72 ng/mL (ref 0.10–4.00)

## 2016-10-29 ENCOUNTER — Telehealth: Payer: Self-pay | Admitting: Family Medicine

## 2016-10-29 DIAGNOSIS — E349 Endocrine disorder, unspecified: Secondary | ICD-10-CM

## 2016-10-29 NOTE — Telephone Encounter (Signed)
-----   Message from Ellamae Sia sent at 10/29/2016  9:16 AM EDT ----- Regarding: RE: add test? Please put the order in, he will come by sometime this week before 10 am ----- Message ----- From: Abner Greenspan, MD Sent: 10/27/2016   8:51 AM To: Ellamae Sia Subject: RE: add test?                                  I was out of town and not getting this until Saturday  Too late I assume  He can come in for a re draw if he wants to or we can do it at his f/u ----- Message ----- From: Ellamae Sia Sent: 10/26/2016   8:28 AM To: Abner Greenspan, MD Subject: add test?                                      Pa is on testosterone and wants a level done today. Thanks, T

## 2016-10-31 ENCOUNTER — Other Ambulatory Visit (INDEPENDENT_AMBULATORY_CARE_PROVIDER_SITE_OTHER): Payer: Managed Care, Other (non HMO)

## 2016-10-31 DIAGNOSIS — E349 Endocrine disorder, unspecified: Secondary | ICD-10-CM

## 2016-10-31 LAB — TESTOSTERONE: Testosterone: 373.51 ng/dL (ref 300.00–890.00)

## 2016-11-02 ENCOUNTER — Ambulatory Visit (INDEPENDENT_AMBULATORY_CARE_PROVIDER_SITE_OTHER): Payer: Managed Care, Other (non HMO) | Admitting: Family Medicine

## 2016-11-02 ENCOUNTER — Encounter: Payer: Self-pay | Admitting: Family Medicine

## 2016-11-02 VITALS — BP 136/84 | HR 88 | Ht 72.0 in | Wt 191.0 lb

## 2016-11-02 DIAGNOSIS — Z23 Encounter for immunization: Secondary | ICD-10-CM

## 2016-11-02 DIAGNOSIS — E349 Endocrine disorder, unspecified: Secondary | ICD-10-CM

## 2016-11-02 DIAGNOSIS — R7309 Other abnormal glucose: Secondary | ICD-10-CM

## 2016-11-02 DIAGNOSIS — L989 Disorder of the skin and subcutaneous tissue, unspecified: Secondary | ICD-10-CM | POA: Diagnosis not present

## 2016-11-02 DIAGNOSIS — F17200 Nicotine dependence, unspecified, uncomplicated: Secondary | ICD-10-CM

## 2016-11-02 DIAGNOSIS — Z125 Encounter for screening for malignant neoplasm of prostate: Secondary | ICD-10-CM | POA: Diagnosis not present

## 2016-11-02 DIAGNOSIS — Z0000 Encounter for general adult medical examination without abnormal findings: Secondary | ICD-10-CM | POA: Diagnosis not present

## 2016-11-02 DIAGNOSIS — E7800 Pure hypercholesterolemia, unspecified: Secondary | ICD-10-CM | POA: Diagnosis not present

## 2016-11-02 DIAGNOSIS — Z789 Other specified health status: Secondary | ICD-10-CM

## 2016-11-02 DIAGNOSIS — I10 Essential (primary) hypertension: Secondary | ICD-10-CM | POA: Diagnosis not present

## 2016-11-02 DIAGNOSIS — Z7289 Other problems related to lifestyle: Secondary | ICD-10-CM

## 2016-11-02 DIAGNOSIS — R7303 Prediabetes: Secondary | ICD-10-CM | POA: Insufficient documentation

## 2016-11-02 MED ORDER — VARENICLINE TARTRATE 0.5 MG X 11 & 1 MG X 42 PO MISC: ORAL | 0 refills | Status: DC

## 2016-11-02 MED ORDER — VARENICLINE TARTRATE 1 MG PO TABS: 1.0000 mg | ORAL_TABLET | Freq: Two times a day (BID) | ORAL | 3 refills | Status: DC

## 2016-11-02 NOTE — Progress Notes (Signed)
Subjective:    Patient ID: Antonio Russell, male    DOB: 14-May-1960, 56 y.o.   MRN: [id number]  HPI Here for health maintenance exam and to review chronic medical problems    Feeling pretty good overall   Wt Readings from Last 3 Encounters:  11/02/16 191 lb (86.6 kg)  02/24/15 195 lb (88.5 kg)  12/29/14 193 lb 11.2 oz (87.9 kg)  [facility] 185 and gained some back/ gained some muscle  Eating healthy most of the time  25.90 kg/m   Colonoscopy 11/15 - recall due in Nov  Had an adenomatous polyp   Tetanus shot 2016 at [facility]    Hx of low testosterone Level is 373.5 -in nl range  He went to another doctor and was treated for it (injections)  Gets one shot every 10 days  He feels better -more energy   HIV screening - not interested  Not high risk     Flu shot -gets every year at [facility]  Hep C screen neg in 1/13  pna 23 vaccine 2/11 7 years since last vaccine - will get one today   Smoking status-about 1 ppd  Starting to think about quitting- getting expensive  Has tried vape -not helping a bit  35 pack years  Is interested in chantix - getting ready to quit  A little cough in the am   bp is stable today  No cp or palpitations or headaches or edema  No side effects to medicines  BP Readings from Last 3 Encounters:  11/02/16 136/84  12/29/14 130/79  11/30/14 (!) 148/92      Pulse Readings from Last 3 Encounters:  11/02/16 88  12/29/14 97  11/30/14 100     Hyperlipidemia Lab Results  Component Value Date   CHOL 217 (H) 10/26/2016   CHOL (H) 07/05/2010    249        ATP III CLASSIFICATION:  <200     mg/dL   Desirable  200-239  mg/dL   Borderline High  >=240    mg/dL   High          CHOL 225 (H) 07/25/2009   Lab Results  Component Value Date   HDL 43.60 10/26/2016   HDL 46 07/05/2010   HDL 53.50 07/25/2009   Lab Results  Component Value Date   LDLCALC 155 (H) 10/26/2016   LDLCALC (H) 07/05/2010    166        Total  Cholesterol/HDL:CHD Risk Coronary Heart Disease Risk Table                     Men   Women  1/2 Average Risk   3.4   3.3  Average Risk       5.0   4.4  2 X Average Risk   9.6   7.1  3 X Average Risk  23.4   11.0        Use the calculated Patient Ratio above and the CHD Risk Table to determine the patient's CHD Risk.        ATP III CLASSIFICATION (LDL):  <100     mg/dL   Optimal  100-129  mg/dL   Near or Above                    Optimal  130-159  mg/dL   Borderline  160-189  mg/dL   High  >190  mg/dL   Very High   Lab Results  Component Value Date   TRIG 91.0 10/26/2016   TRIG 186 (H) 07/05/2010   TRIG 208.0 (H) 07/25/2009   Lab Results  Component Value Date   CHOLHDL 5 10/26/2016   CHOLHDL 5.4 07/05/2010   CHOLHDL 4 07/25/2009   Lab Results  Component Value Date   LDLDIRECT 150.5 07/25/2009   LDLDIRECT 220.0 05/11/2009    In the setting of CAD and smoking  Intol of statins   Prostate screen Lab Results  Component Value Date   PSA 0.72 10/26/2016    No problems with urination at all  No nocturia  No family hx of prostate cancer   Lab Results  Component Value Date   WBC 9.9 10/26/2016   HGB 17.7 (H) 10/26/2016   HCT 53.7 (H) 10/26/2016   MCV 102.4 (H) 10/26/2016   PLT 251.0 10/26/2016    Smoking - inc HB MCV large-may be from etoh ( 3-4 drinks per night)     Chemistry      Component Value Date/Time   NA 140 10/26/2016 0826   K 5.0 10/26/2016 0826   CL 106 10/26/2016 0826   CO2 29 10/26/2016 0826   BUN 10 10/26/2016 0826   CREATININE 1.00 10/26/2016 0826      Component Value Date/Time   CALCIUM 11.0 (H) 10/26/2016 0826   ALKPHOS 78 10/26/2016 0826   AST 17 10/26/2016 0826   ALT 16 10/26/2016 0826   BILITOT 0.6 10/26/2016 0826     glucose 110 -had eaten (non fasting)  Lab Results  Component Value Date   TSH 0.96 10/26/2016     Has a mole on L leg with 2 colors  Not bothering him ? There for a year   Patient Active Problem List    Diagnosis Date Noted  . Elevated glucose level 11/02/2016  . Skin lesion of left leg 11/02/2016  . Routine general medical examination at a health care facility 10/25/2016  . Prostate cancer screening 10/25/2016  . Leukocytosis 11/30/2014  . Abdominal pain, right upper quadrant 11/30/2014  . Arm pain, left 05/08/2011  . Special screening for malignant neoplasms, colon 02/16/2011  . Right leg pain 01/08/2011  . Tachycardia 08/10/2010  . Back pain   . Arthritis   . Essential hypertension 07/13/2010  . CAD (coronary artery disease)   . Testosterone deficiency 05/25/2009  . Hyperlipidemia 05/25/2009  . 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 RENAL CALCULUS, HX OF 05/11/2009  . TOBACCO USE 09/12/2007  . LOSS, HEARING NOS 10/10/2006   Past Medical History:  Diagnosis Date  . Allergic rhinitis   . Anxiety   . Arthritis   . Back pain   . Chest pain 2012   cardiac cath - nonobstructive, nl LV fxn, rec aggressive med management  . Headache(784.0)   . HLD (hyperlipidemia)   . Impotence of organic origin   . Other testicular hypofunction   . Personal history of urinary calculi   . Tobacco use disorder   . Unspecified hearing loss    Past Surgical History:  Procedure Laterality Date  . COLONOSCOPY    . HEMORRHOID SURGERY    . KNEE SURGERY Right 2013  . NASAL SEPTUM SURGERY     Social History  Substance Use Topics  . Smoking status: Current Every Day Smoker    Packs/day: 1.00    Years: 35.00    Types: Cigarettes  . Smokeless tobacco: Former Systems developer     Comment:  just recently decreased from 1 PPD-purchased electronic cigarette  . Alcohol use 0.0 oz/week     Comment: 6 pack 3 times weekly   Family History  Problem Relation Age of Onset  . Hypertension Mother   . Hyperlipidemia Mother   . Diabetes Mother   . Hypertension Father   . Hyperlipidemia Father   . Cancer Father        throat  . Hypertension Sister   . Lung cancer Unknown        GF  . Heart disease Unknown         GM  . Esophageal cancer Paternal Uncle   . Colon cancer Neg Hx   . Stomach cancer Neg Hx   . Rectal cancer Neg Hx    Allergies  Allergen Reactions  . Crestor [Rosuvastatin Calcium] Other (See Comments)    Muscle aches  . Lipitor [Atorvastatin Calcium] Other (See Comments)    Arm soreness   . Sertraline Hcl Other (See Comments)    REACTION: muscle twitches  . Zocor [Simvastatin - High Dose] Other (See Comments)    Muscle pain    Current Outpatient Prescriptions on File Prior to Visit  Medication Sig Dispense Refill  . testosterone cypionate (DEPOTESTOTERONE CYPIONATE) 100 MG/ML injection Inject into the muscle every 28 (twenty-eight) days. For IM use only    . cetirizine (ZYRTEC) 10 MG tablet Take 10 mg by mouth as needed for allergies.    [name] zolpidem (AMBIEN CR) 12.5 MG CR tablet   5   No current facility-administered medications on file prior to visit.     Review of Systems Review of Systems  Constitutional: Negative for fever, appetite change, fatigue and unexpected weight change.  Eyes: Negative for pain and visual disturbance.  Respiratory: Negative for cough and shortness of breath.   Cardiovascular: Negative for cp or palpitations    Gastrointestinal: Negative for nausea, diarrhea and constipation.  Genitourinary: Negative for urgency and frequency. (pos for better energy with testosterone tx) Skin: Negative for pallor or rash   Neurological: Negative for weakness, light-headedness, numbness and headaches.  Hematological: Negative for adenopathy. Does not bruise/bleed easily.  Psychiatric/Behavioral: Negative for dysphoric mood. The patient is not nervous/anxious.         Objective:   Physical Exam  Constitutional: He appears well-developed and well-nourished. No distress.  Well appearing   HENT:  Head: Normocephalic and atraumatic.  Right Ear: External ear normal.  Left Ear: External ear normal.  Nose: Nose normal.  Mouth/Throat: Oropharynx is clear and  moist.  Eyes: Pupils are equal, round, and reactive to light. Conjunctivae and EOM are normal. Right eye exhibits no discharge. Left eye exhibits no discharge. No scleral icterus.  Neck: Normal range of motion. Neck supple. No JVD present. Carotid bruit is not present. No thyromegaly present.  Cardiovascular: Normal rate, regular rhythm, normal heart sounds and intact distal pulses.  Exam reveals no gallop.   Pulmonary/Chest: Effort normal and breath sounds normal. No respiratory distress. He has no wheezes. He exhibits no tenderness.  Diffusely distant bs  No wheeze- good air exch  Abdominal: Soft. Bowel sounds are normal. He exhibits no distension, no abdominal bruit and no mass. There is no tenderness.  Musculoskeletal: He exhibits no edema or tenderness.  Lymphadenopathy:    He has no cervical adenopathy.  Neurological: He is alert. He has normal reflexes. No cranial nerve deficit. He exhibits normal muscle tone. Coordination normal.  Skin: Skin is warm and dry. No rash noted.  No erythema. No pallor.  2-3 mm irregular nevus on L  Lower leg , flat and nt with 2 colors of brown  Psychiatric: He has a normal mood and affect.  Cheerful and talkative           Assessment & Plan:   Problem List Items Addressed This Visit      Cardiovascular and Mediastinum   Essential hypertension    bp in fair control at this time  BP Readings from Last 1 Encounters:  11/02/16 136/84   No changes needed Disc lifstyle change with low sodium diet and exercise  Labs reviewed Enc to quit smoking and keep exercising         Musculoskeletal and Integument   Skin lesion of left leg    Small flat nevus with 2 colors and irregular shape  Ref to derm for this and a skin survey      Relevant Orders   Ambulatory referral to Dermatology     Other   Alcohol use    3-4 drinks per night Pt does not feel dependent Adv that 2 or less is safer for liver and overall health      Elevated glucose  level    Glucose of 110 - but not entirely fasting  Disc need for lower glycemic diet  Will do a1c with next labs       Hyperlipidemia    Disc goals for lipids and reasons to control them Rev labs with pt Rev low sat fat diet in detail  Intolerant of statins  Worrisome in light of known CAD and smoking       Prostate cancer screening    He is on testosterone tx from another provider and needs ref to urology to continue this  No voiding symptoms  No fam hx  Lab Results  Component Value Date   PSA 0.72 10/26/2016         Routine general medical examination at a health care facility - Primary    Reviewed health habits including diet and exercise and skin cancer prevention Reviewed appropriate screening tests for age  Also reviewed health mt list, fam hx and immunization status , as well as social and family history   Labs rev High cholesterol- intol to statins Disc smoking cessation and chantix px given  Ref to derm for mole on leg  Ref to urology to continue testosterone tx  PNA 23 vaccine today We will find out of 2016 tetanus shot was Td or Tdap (he has a grandchild on the way)  Disc limiting etoh to 2 or less drinks per day      Testosterone deficiency   Relevant Orders   Ambulatory referral to Urology   TOBACCO USE    Disc in detail risks of smoking and possible outcomes including copd, vascular/ heart disease, cancer , respiratory and sinus infections  Pt voices understanding  He is motivated and may quit this year  Px for chantix done  Info on lung cancer screening given - he fits criteria       Other Visit Diagnoses    Need for pneumococcal vaccination       Relevant Orders   Pneumococcal polysaccharide vaccine 23-valent greater than or equal to 2yo subcutaneous/IM (Completed)

## 2016-11-02 NOTE — Patient Instructions (Addendum)
We will try to find out which tetanus shot you had in 2016   We will update your pneumonia shot today   Make sure to call in Sept and get your colonoscopy scheduled   Make sure to get your flu shot in the fall   Try the chantix to treat smoking  Good luck quitting   For cholesterol: Avoid red meat/ fried foods/ egg yolks/ fatty breakfast meats/ butter, cheese and high fat dairy/ and shellfish    The safe amount of alcohol for healthy male liver health is 2 or less drinks per day   Shirlean Mylar will refer you to dermatology to look at mole on your leg and also urology

## 2016-11-04 DIAGNOSIS — Z789 Other specified health status: Secondary | ICD-10-CM | POA: Insufficient documentation

## 2016-11-04 DIAGNOSIS — Z7289 Other problems related to lifestyle: Secondary | ICD-10-CM | POA: Insufficient documentation

## 2016-11-04 NOTE — Assessment & Plan Note (Signed)
Disc in detail risks of smoking and possible outcomes including copd, vascular/ heart disease, cancer , respiratory and sinus infections  Pt voices understanding  He is motivated and may quit this year  Px for chantix done  Info on lung cancer screening given - he fits criteria

## 2016-11-04 NOTE — Assessment & Plan Note (Signed)
Glucose of 110 - but not entirely fasting  Disc need for lower glycemic diet  Will do a1c with next labs

## 2016-11-04 NOTE — Assessment & Plan Note (Signed)
Small flat nevus with 2 colors and irregular shape  Ref to derm for this and a skin survey

## 2016-11-04 NOTE — Assessment & Plan Note (Signed)
Reviewed health habits including diet and exercise and skin cancer prevention Reviewed appropriate screening tests for age  Also reviewed health mt list, fam hx and immunization status , as well as social and family history   Labs rev High cholesterol- intol to statins Disc smoking cessation and chantix px given  Ref to derm for mole on leg  Ref to urology to continue testosterone tx  PNA 23 vaccine today We will find out of 2016 tetanus shot was Td or Tdap (he has a grandchild on the way)  Disc limiting etoh to 2 or less drinks per day

## 2016-11-04 NOTE — Assessment & Plan Note (Signed)
bp in fair control at this time  BP Readings from Last 1 Encounters:  11/02/16 136/84   No changes needed Disc lifstyle change with low sodium diet and exercise  Labs reviewed Enc to quit smoking and keep exercising

## 2016-11-04 NOTE — Assessment & Plan Note (Signed)
3-4 drinks per night Pt does not feel dependent Adv that 2 or less is safer for liver and overall health

## 2016-11-04 NOTE — Assessment & Plan Note (Signed)
Disc goals for lipids and reasons to control them Rev labs with pt Rev low sat fat diet in detail  Intolerant of statins  Worrisome in light of known CAD and smoking

## 2016-11-04 NOTE — Assessment & Plan Note (Signed)
He is on testosterone tx from another provider and needs ref to urology to continue this  No voiding symptoms  No fam hx  Lab Results  Component Value Date   PSA 0.72 10/26/2016

## 2016-11-05 ENCOUNTER — Encounter: Payer: Self-pay | Admitting: Family Medicine

## 2016-11-05 ENCOUNTER — Ambulatory Visit (INDEPENDENT_AMBULATORY_CARE_PROVIDER_SITE_OTHER): Payer: Managed Care, Other (non HMO) | Admitting: Family Medicine

## 2016-11-05 VITALS — BP 122/74 | HR 109 | Temp 98.3°F | Resp 16 | Wt 191.8 lb

## 2016-11-05 DIAGNOSIS — K644 Residual hemorrhoidal skin tags: Secondary | ICD-10-CM | POA: Diagnosis not present

## 2016-11-05 MED ORDER — HYDROCORTISONE 2.5 % RE CREA: 1.0000 "application " | TOPICAL_CREAM | Freq: Three times a day (TID) | RECTAL | 1 refills | Status: DC

## 2016-11-05 MED ORDER — TRAMADOL HCL 50 MG PO TABS: 50.0000 mg | ORAL_TABLET | Freq: Three times a day (TID) | ORAL | 0 refills | Status: DC | PRN

## 2016-11-05 NOTE — Progress Notes (Signed)
Subjective:    Patient ID: Antonio Russell, male    DOB: Mar 15, 1961, 56 y.o.   MRN: [id number]  HPI  Here for hemorrhoid problems   Saturday a hemorrhoid developed/sticking out  Tender to palpation  Then became more painful  No bleeding   Used TUKS wipes    Sharp pain radiating down L leg started 2 days ago Back is not bothering her  Even throbbing in "prostate area" No urinary symptoms   No abd pain   No constipation  No straining  No heavy lifting   Had some frequent bms on Sat-may have irritated it   Had hemorroid surgery 20 y ago -internal /bled   Wt Readings from Last 3 Encounters:  11/05/16 191 lb 12.8 oz (87 kg)  11/02/16 191 lb (86.6 kg)  02/24/15 195 lb (88.5 kg)    Patient Active Problem List   Diagnosis Date Noted  . External hemorrhoid 11/05/2016  . Alcohol use 11/04/2016  . Elevated glucose level 11/02/2016  . Skin lesion of left leg 11/02/2016  . Routine general medical examination at a health care facility 10/25/2016  . Prostate cancer screening 10/25/2016  . Leukocytosis 11/30/2014  . Abdominal pain, right upper quadrant 11/30/2014  . Arm pain, left 05/08/2011  . Special screening for malignant neoplasms, colon 02/16/2011  . Right leg pain 01/08/2011  . Tachycardia 08/10/2010  . Back pain   . Arthritis   . Essential hypertension 07/13/2010  . CAD (coronary artery disease)   . Testosterone deficiency 05/25/2009  . Hyperlipidemia 05/25/2009  . 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 RENAL CALCULUS, HX OF 05/11/2009  . TOBACCO USE 09/12/2007  . LOSS, HEARING NOS 10/10/2006   Past Medical History:  Diagnosis Date  . Allergic rhinitis   . Anxiety   . Arthritis   . Back pain   . Chest pain 2012   cardiac cath - nonobstructive, nl LV fxn, rec aggressive med management  . Headache(784.0)   . HLD (hyperlipidemia)   . Impotence of organic origin   . Other testicular hypofunction   . Personal history of urinary calculi   . Tobacco use  disorder   . Unspecified hearing loss    Past Surgical History:  Procedure Laterality Date  . COLONOSCOPY    . HEMORRHOID SURGERY    . KNEE SURGERY Right 2013  . NASAL SEPTUM SURGERY     Social History  Substance Use Topics  . Smoking status: Current Every Day Smoker    Packs/day: 1.00    Years: 35.00    Types: Cigarettes  . Smokeless tobacco: Former Systems developer     Comment: just recently decreased from 1 PPD-purchased electronic cigarette  . Alcohol use 0.0 oz/week     Comment: 6 pack 3 times weekly   Family History  Problem Relation Age of Onset  . Hypertension Mother   . Hyperlipidemia Mother   . Diabetes Mother   . Hypertension Father   . Hyperlipidemia Father   . Cancer Father        throat  . Hypertension Sister   . Lung cancer Unknown        GF  . Heart disease Unknown        GM  . Esophageal cancer Paternal Uncle   . Colon cancer Neg Hx   . Stomach cancer Neg Hx   . Rectal cancer Neg Hx    Allergies  Allergen Reactions  . Crestor [Rosuvastatin Calcium] Other (See Comments)    Muscle  aches  . Lipitor [Atorvastatin Calcium] Other (See Comments)    Arm soreness   . Sertraline Hcl Other (See Comments)    REACTION: muscle twitches  . Zocor [Simvastatin - High Dose] Other (See Comments)    Muscle pain    Current Outpatient Prescriptions on File Prior to Visit  Medication Sig Dispense Refill  . cetirizine (ZYRTEC) 10 MG tablet Take 10 mg by mouth as needed for allergies.    [name] testosterone cypionate (DEPOTESTOTERONE CYPIONATE) 100 MG/ML injection Inject into the muscle every 28 (twenty-eight) days. For IM use only    . varenicline (CHANTIX PAK) 0.5 MG X 11 & 1 MG X 42 tablet Take one 0.5 mg tablet by mouth once daily for 3 days, then increase to one 0.5 mg tablet twice daily for 4 days, then increase to one 1 mg tablet twice daily. 53 tablet 0  . varenicline (CHANTIX) 1 MG tablet Take 1 tablet (1 mg total) by mouth 2 (two) times daily. 60 tablet 3  . zolpidem  (AMBIEN CR) 12.5 MG CR tablet   5   No current facility-administered medications on file prior to visit.     Review of Systems Review of Systems  Constitutional: Negative for fever, appetite change, fatigue and unexpected weight change.  Eyes: Negative for pain and visual disturbance.  Respiratory: Negative for cough and shortness of breath.   Cardiovascular: Negative for cp or palpitations    Gastrointestinal: Negative for nausea, diarrhea and constipation. Pos for hemorrhoids  with rectal pain  Genitourinary: Negative for urgency and frequency.  Skin: Negative for pallor or rash   Neurological: Negative for weakness, light-headedness, numbness and headaches.  Hematological: Negative for adenopathy. Does not bruise/bleed easily.  Psychiatric/Behavioral: Negative for dysphoric mood. The patient is not nervous/anxious.         Objective:   Physical Exam  Constitutional: He appears well-developed and well-nourished. No distress.  HENT:  Head: Normocephalic and atraumatic.  Abdominal: Soft. Bowel sounds are normal. He exhibits no distension. There is no tenderness. There is no rebound and no guarding.  Genitourinary:  Genitourinary Comments: External hemorrhoid at 5:00   (0.5 cm) tender and non thrombosed No bleeding  No prolapsed hemorrhoids noted   Musculoskeletal: He exhibits no edema.  No ls tenderness  Neurological: He is alert.  Skin: No rash noted. No pallor.  Psychiatric: He has a normal mood and affect.          Assessment & Plan:   Problem List Items Addressed This Visit      Cardiovascular and Mediastinum   External hemorrhoid    Large non thrombosed external hemorrhoid at 5:00 tx with anusol hc cream tid Avoid straining  Donut pillow Ice packs  Sitz baths Prevent constipation and straining (stool softener) Tramadol for pain prn with caution of sedation and constipation  Update if not starting to improve in a week or if worsening

## 2016-11-05 NOTE — Assessment & Plan Note (Signed)
Large non thrombosed external hemorrhoid at 5:00 tx with anusol hc cream tid Avoid straining  Donut pillow Ice packs  Sitz baths Prevent constipation and straining (stool softener) Tramadol for pain prn with caution of sedation and constipation  Update if not starting to improve in a week or if worsening

## 2016-11-05 NOTE — Patient Instructions (Addendum)
You have an external hemorrhoid  Use a cold compress when you can  Sitz bath is fine also - mild soapy water / warm but not hot Get a round pillow (donut pillow) to sit on  Stay off of it as much as you can  Use the anusol hc three times daily  Take tramadol as needed for severe pain (watch for sedation and also constipation) miralax or colace are good to keep stools soft   Update if not starting to improve in a week or if worsening

## 2016-11-06 ENCOUNTER — Telehealth: Payer: Self-pay | Admitting: *Deleted

## 2016-11-06 NOTE — Telephone Encounter (Signed)
I called [facility] and they requested that I fax over the request. Pt had an OV for a laceration on 07/13/14. I request the OV and record of vaccine given but only received the OV note. I don't see a Tdap/Td listed on this but I did place OV note in Dr. Marliss Coots inbox for review to make sure I'm not over looking vaccine because this was before [facility] went on EPIC and OV note is all hand written.

## 2016-11-06 NOTE — Telephone Encounter (Signed)
Pt advise of Dr. Marliss Coots comments/ recommendations. Pt will go to his local pharmacy and get Tdap vaccine when able. I requested that pt advise [place] or have pharmacy advise [place] when he gets the Tdap so we can update his record

## 2016-11-06 NOTE — Telephone Encounter (Signed)
The note only says he had a tetanus shot "tetanus imm given in ED"- but not what type  So he should get a Tdap here before the grand child is born to assure he is up to date Thanks

## 2016-11-06 NOTE — Telephone Encounter (Signed)
-----   Message from Abner Greenspan, MD sent at 11/04/2016 10:42 AM EDT ----- Do you know if anyone found out about his last tetanus shot from [place] in 2016?  We need to know if it was Td or Tdap?  Thanks

## 2016-11-12 ENCOUNTER — Telehealth: Payer: Self-pay | Admitting: Family Medicine

## 2016-11-12 DIAGNOSIS — K644 Residual hemorrhoidal skin tags: Secondary | ICD-10-CM

## 2016-11-12 NOTE — Telephone Encounter (Signed)
Rosaria Ferries- he has a painful external hemorrhoid - do you think it would take longer to get in with GI or gen surg  An extender is fine  Thanks- please let me know

## 2016-11-12 NOTE — Telephone Encounter (Signed)
Caller Name:Marcia Enochs  Relationship to Patient:self Best number:[phone] Pharmacy:  Reason for call: needs referral to surgeon, his symptoms are not getting better since last weeks visit  Prefers [place]

## 2016-11-13 NOTE — Telephone Encounter (Signed)
Called patient to give him the Appt info with Dr Jamal Collin for this Thursday. He said he hasnt been back to work since seeing you last Monday August 6th.Can you  provide him with a Dr's note covering him until he sees the Surgeon or do you need him to come back in to see you. He said he left a Triage message about not being able to return to work but noone ever called him back. Pls call patient back. [facility] was over 1 month out to be seen and [facility] was out 2 weeks.

## 2016-11-13 NOTE — Telephone Encounter (Signed)
Ref done, thanks  Letter ready for pick up in IN box

## 2016-11-13 NOTE — Telephone Encounter (Signed)
Pt notified letter ready for pick up. 

## 2016-11-15 ENCOUNTER — Ambulatory Visit (INDEPENDENT_AMBULATORY_CARE_PROVIDER_SITE_OTHER): Payer: Managed Care, Other (non HMO) | Admitting: General Surgery

## 2016-11-15 ENCOUNTER — Encounter: Payer: Self-pay | Admitting: General Surgery

## 2016-11-15 VITALS — BP 128/74 | HR 72 | Resp 12 | Ht 72.0 in | Wt 189.0 lb

## 2016-11-15 DIAGNOSIS — K644 Residual hemorrhoidal skin tags: Secondary | ICD-10-CM | POA: Diagnosis not present

## 2016-11-15 NOTE — Progress Notes (Signed)
Patient ID: Antonio Russell, male   DOB: 1960-06-29, 56 y.o.   MRN: [id number]  Chief Complaint  Patient presents with  . Hemorrhoids    HPI Antonio Russell is a 56 y.o. male here today for evaluation of hemorrhoids. Patient states he noticed this around November 05, 2016. No bleeding but pain.  HPI  Past Medical History:  Diagnosis Date  . Allergic rhinitis   . Anxiety   . Arthritis   . Back pain   . Chest pain 2012   cardiac cath - nonobstructive, nl LV fxn, rec aggressive med management  . Headache(784.0)   . HLD (hyperlipidemia)   . Impotence of organic origin   . Other testicular hypofunction   . Personal history of urinary calculi   . Tobacco use disorder   . Unspecified hearing loss     Past Surgical History:  Procedure Laterality Date  . COLONOSCOPY    . HEMORRHOID SURGERY    . KNEE SURGERY Right 2013  . NASAL SEPTUM SURGERY      Family History  Problem Relation Age of Onset  . Hypertension Mother   . Hyperlipidemia Mother   . Diabetes Mother   . Hypertension Father   . Hyperlipidemia Father   . Cancer Father        throat  . Hypertension Sister   . Lung cancer Unknown        GF  . Heart disease Unknown        GM  . Esophageal cancer Paternal Uncle   . Colon cancer Neg Hx   . Stomach cancer Neg Hx   . Rectal cancer Neg Hx     Social History Social History  Substance Use Topics  . Smoking status: Current Every Day Smoker    Packs/day: 1.00    Years: 35.00    Types: Cigarettes  . Smokeless tobacco: Former Systems developer     Comment: just recently decreased from 1 PPD-purchased electronic cigarette  . Alcohol use 0.0 oz/week     Comment: 6 pack 3 times weekly    Allergies  Allergen Reactions  . Crestor [Rosuvastatin Calcium] Other (See Comments)    Muscle aches  . Lipitor [Atorvastatin Calcium] Other (See Comments)    Arm soreness   . Sertraline Hcl Other (See Comments)    REACTION: muscle twitches  . Zocor [Simvastatin - High Dose] Other (See Comments)     Muscle pain     Current Outpatient Prescriptions  Medication Sig Dispense Refill  . cetirizine (ZYRTEC) 10 MG tablet Take 10 mg by mouth as needed for allergies.    . hydrocortisone (ANUSOL-HC) 2.5 % rectal cream Place 1 application rectally 3 (three) times daily. To affected area 30 g 1  . testosterone cypionate (DEPOTESTOTERONE CYPIONATE) 100 MG/ML injection Inject into the muscle every 28 (twenty-eight) days. For IM use only    . traMADol (ULTRAM) 50 MG tablet Take 1 tablet (50 mg total) by mouth every 8 (eight) hours as needed for severe pain. Caution of sedation and constipation 20 tablet 0  . varenicline (CHANTIX PAK) 0.5 MG X 11 & 1 MG X 42 tablet Take one 0.5 mg tablet by mouth once daily for 3 days, then increase to one 0.5 mg tablet twice daily for 4 days, then increase to one 1 mg tablet twice daily. 53 tablet 0  . varenicline (CHANTIX) 1 MG tablet Take 1 tablet (1 mg total) by mouth 2 (two) times daily. 60 tablet 3  . zolpidem (  AMBIEN CR) 12.5 MG CR tablet   5   No current facility-administered medications for this visit.     Review of Systems Review of Systems  Constitutional: Negative.   Respiratory: Negative.   Cardiovascular: Negative.     Blood pressure 128/74, pulse 72, resp. rate 12, height 6' (1.829 m), weight 189 lb (85.7 kg).  Physical Exam Physical Exam  Constitutional: He is oriented to person, place, and time. He appears well-developed and well-nourished.  Eyes: Conjunctivae are normal. No scleral icterus.  Cardiovascular: Normal rate and regular rhythm.   Abdominal: Soft. Normal appearance and bowel sounds are normal. There is no tenderness.  Genitourinary: Rectal exam shows external hemorrhoid (inflamed , edematous  2cm size. No palpable clots).  Neurological: He is alert and oriented to person, place, and time.  Skin: Skin is warm and dry.    Data Reviewed Notes reviewed   Assessment    External hemorrhoid,Swollen and inflamed.     Plan        Patient to do Sitz baths, keep using the cream.  Return in one month or sooner if the pain worsens   HPI, Physical Exam, Assessment and Plan have been scribed under the direction and in the presence of Mckinley Jewel, MD  Gaspar Cola, CMA I have completed the exam and reviewed the above documentation for accuracy and completeness.  I agree with the above.  Haematologist has been used and any errors in dictation or transcription are unintentional.  Seeplaputhur G. Jamal Collin, M.D., F.A.C.S.   Junie Panning G 11/19/2016, 8:29 AM

## 2016-11-15 NOTE — Patient Instructions (Signed)

## 2016-12-18 ENCOUNTER — Ambulatory Visit (INDEPENDENT_AMBULATORY_CARE_PROVIDER_SITE_OTHER): Payer: Managed Care, Other (non HMO) | Admitting: General Surgery

## 2016-12-18 ENCOUNTER — Encounter: Payer: Self-pay | Admitting: General Surgery

## 2016-12-18 VITALS — BP 154/86 | HR 87 | Resp 12 | Ht 72.0 in | Wt 189.0 lb

## 2016-12-18 DIAGNOSIS — K644 Residual hemorrhoidal skin tags: Secondary | ICD-10-CM

## 2016-12-18 NOTE — Progress Notes (Signed)
Patient ID: Antonio Russell, male   DOB: Apr 25, 1960, 56 y.o.   MRN: [id number]  Chief Complaint  Patient presents with  . Follow-up    HPI [name] is a 56 y.o. male here today for his follow up hemorrhoids. Patient states he is much better after applying topical cream. No bleeding or pain. HPI  Past Medical History:  Diagnosis Date  . Allergic rhinitis   . Anxiety   . Arthritis   . Back pain   . Chest pain 2012   cardiac cath - nonobstructive, nl LV fxn, rec aggressive med management  . Headache(784.0)   . HLD (hyperlipidemia)   . Impotence of organic origin   . Other testicular hypofunction   . Personal history of urinary calculi   . Tobacco use disorder   . Unspecified hearing loss     Past Surgical History:  Procedure Laterality Date  . COLONOSCOPY    . HEMORRHOID SURGERY    . KNEE SURGERY Right 2013  . NASAL SEPTUM SURGERY      Family History  Problem Relation Age of Onset  . Hypertension Mother   . Hyperlipidemia Mother   . Diabetes Mother   . Hypertension Father   . Hyperlipidemia Father   . Cancer Father        throat  . Hypertension Sister   . Lung cancer Unknown        GF  . Heart disease Unknown        GM  . Esophageal cancer Paternal Uncle   . Colon cancer Neg Hx   . Stomach cancer Neg Hx   . Rectal cancer Neg Hx     Social History Social History  Substance Use Topics  . Smoking status: Current Every Day Smoker    Packs/day: 1.00    Years: 35.00    Types: Cigarettes  . Smokeless tobacco: Former Systems developer     Comment: just recently decreased from 1 [employer]-purchased electronic cigarette  . Alcohol use 0.0 oz/week     Comment: 6 pack 3 times weekly    Allergies  Allergen Reactions  . Crestor [Rosuvastatin Calcium] Other (See Comments)    Muscle aches  . Lipitor [Atorvastatin Calcium] Other (See Comments)    Arm soreness   . Sertraline Hcl Other (See Comments)    REACTION: muscle twitches  . Zocor [Simvastatin - High Dose] Other (See  Comments)    Muscle pain     Current Outpatient Prescriptions  Medication Sig Dispense Refill  . cetirizine (ZYRTEC) 10 MG tablet Take 10 mg by mouth as needed for allergies.    . hydrocortisone (ANUSOL-HC) 2.5 % rectal cream Place 1 application rectally 3 (three) times daily. To affected area 30 g 1  . testosterone cypionate (DEPOTESTOTERONE CYPIONATE) 100 MG/ML injection Inject into the muscle every 28 (twenty-eight) days. For IM use only    . traMADol (ULTRAM) 50 MG tablet Take 1 tablet (50 mg total) by mouth every 8 (eight) hours as needed for severe pain. Caution of sedation and constipation 20 tablet 0  . varenicline (CHANTIX PAK) 0.5 MG X 11 & 1 MG X 42 tablet Take one 0.5 mg tablet by mouth once daily for 3 days, then increase to one 0.5 mg tablet twice daily for 4 days, then increase to one 1 mg tablet twice daily. 53 tablet 0  . varenicline (CHANTIX) 1 MG tablet Take 1 tablet (1 mg total) by mouth 2 (two) times daily. 60 tablet 3  .  zolpidem (AMBIEN CR) 12.5 MG CR tablet   5   No current facility-administered medications for this visit.     Review of Systems Review of Systems  Blood pressure (!) 154/86, pulse 87, resp. rate 12, height 6' (1.829 m), weight 189 lb (85.7 kg).  Physical Exam Physical Exam  Constitutional: He is oriented to person, place, and time. He appears well-developed and well-nourished.  Genitourinary:     Neurological: He is alert and oriented to person, place, and time.  Skin: Skin is warm and dry.    Data Reviewed Prior notes reviewed   Assessment    External hemorrhoids - well healed. No thrombosis. Previously inflamed hemorrhoids have resolved. History of colonic polyps - patient is due for colonoscopy this year.     Plan    Patient to return as needed. The patient is aware to call back for any questions or concerns. Counseled on high fiber diet to prevent hemorrhoid recurrence. He has plans to schedule his colonoscopy for later this year.      HPI, Physical Exam, Assessment and Plan have been scribed under the direction and in the presence of Mckinley Jewel, MD  Gaspar Cola, CMA  I have completed the exam and reviewed the above documentation for accuracy and completeness.  I agree with the above.  Haematologist has been used and any errors in dictation or transcription are unintentional.  Zetha Kuhar G. Jamal Collin, M.D., F.A.C.S.  Junie Panning G 12/18/2016, 4:42 PM

## 2016-12-18 NOTE — Patient Instructions (Addendum)
Return as needed. Counseled on high fiber diet to prevent hemorrhoid recurrence.

## 2016-12-21 ENCOUNTER — Encounter: Payer: Self-pay | Admitting: Gastroenterology

## 2017-02-01 ENCOUNTER — Ambulatory Visit (AMBULATORY_SURGERY_CENTER): Payer: Self-pay

## 2017-02-01 VITALS — Ht 72.0 in | Wt 185.6 lb

## 2017-02-01 DIAGNOSIS — Z86010 Personal history of colonic polyps: Secondary | ICD-10-CM

## 2017-02-01 MED ORDER — NA SULFATE-K SULFATE-MG SULF 17.5-3.13-1.6 GM/177ML PO SOLN
1.0000 | Freq: Once | ORAL | 0 refills | Status: AC
Start: 2017-02-01 — End: 2017-02-01

## 2017-02-01 NOTE — Progress Notes (Signed)
Denies allergies to eggs or soy products. Denies complication of anesthesia or sedation. Denies use of weight loss medication. Denies use of O2.   Emmi instructions declined.  

## 2017-02-05 ENCOUNTER — Encounter: Payer: Self-pay | Admitting: Gastroenterology

## 2017-02-15 ENCOUNTER — Encounter: Payer: Self-pay | Admitting: Gastroenterology

## 2017-02-15 ENCOUNTER — Ambulatory Visit (AMBULATORY_SURGERY_CENTER): Payer: Managed Care, Other (non HMO) | Admitting: Gastroenterology

## 2017-02-15 ENCOUNTER — Other Ambulatory Visit: Payer: Self-pay

## 2017-02-15 VITALS — BP 132/88 | HR 83 | Temp 98.4°F | Resp 22 | Ht 72.0 in | Wt 185.0 lb

## 2017-02-15 DIAGNOSIS — D122 Benign neoplasm of ascending colon: Secondary | ICD-10-CM | POA: Diagnosis not present

## 2017-02-15 DIAGNOSIS — Z86010 Personal history of colonic polyps: Secondary | ICD-10-CM | POA: Diagnosis not present

## 2017-02-15 DIAGNOSIS — D125 Benign neoplasm of sigmoid colon: Secondary | ICD-10-CM

## 2017-02-15 MED ORDER — SODIUM CHLORIDE 0.9 % IV SOLN: 500.0000 mL | INTRAVENOUS | Status: DC

## 2017-02-15 NOTE — Patient Instructions (Signed)
YOU HAD AN ENDOSCOPIC PROCEDURE TODAY AT [facility]:   Refer to the procedure report that was given to you for any specific questions about what was found during the examination.  If the procedure report does not answer your questions, please call your gastroenterologist to clarify.  If you requested that your care partner not be given the details of your procedure findings, then the procedure report has been included in a sealed envelope for you to review at your convenience later.  YOU SHOULD EXPECT: Some feelings of bloating in the abdomen. Passage of more gas than usual.  Walking can help get rid of the air that was put into your GI tract during the procedure and reduce the bloating. If you had a lower endoscopy (such as a colonoscopy or flexible sigmoidoscopy) you may notice spotting of blood in your stool or on the toilet paper. If you underwent a bowel prep for your procedure, you may not have a normal bowel movement for a few days.  Please Note:  You might notice some irritation and congestion in your nose or some drainage.  This is from the oxygen used during your procedure.  There is no need for concern and it should clear up in a day or so.  SYMPTOMS TO REPORT IMMEDIATELY:   Following lower endoscopy (colonoscopy or flexible sigmoidoscopy):  Excessive amounts of blood in the stool  Significant tenderness or worsening of abdominal pains  Swelling of the abdomen that is new, acute  Fever of 100F or higher   For urgent or emergent issues, a gastroenterologist can be reached at any hour by calling [phone].   DIET:  We do recommend a small meal at first, but then you may proceed to your regular diet.  Drink plenty of fluids but you should avoid alcoholic beverages for 24 hours.  ACTIVITY:  You should plan to take it easy for the rest of today and you should NOT DRIVE or use heavy machinery until tomorrow (because of the sedation medicines used during the test).     FOLLOW UP: Our staff will call the number listed on your records the next business day following your procedure to check on you and address any questions or concerns that you may have regarding the information given to you following your procedure. If we do not reach you, we will leave a message.  However, if you are feeling well and you are not experiencing any problems, there is no need to return our call.  We will assume that you have returned to your regular daily activities without incident.  If any biopsies were taken you will be contacted by phone or by letter within the next 1-3 weeks.  Please call us at [phone] if you have not heard about the biopsies in 3 weeks.    SIGNATURES/CONFIDENTIALITY: You and/or your care partner have signed paperwork which will be entered into your electronic medical record.  These signatures attest to the fact that that the information above on your After Visit Summary has been reviewed and is understood.  Full responsibility of the confidentiality of this discharge information lies with you and/or your care-partner.  No NSAIDs: Aleve, aspirin, ibuprofen for two weeks to prevent bleeding. Read all handouts given to you by your recovery room nurse.

## 2017-02-15 NOTE — Progress Notes (Signed)
Pt's states no medical or surgical changes since previsit or office visit. 

## 2017-02-15 NOTE — Progress Notes (Signed)
Called to room to assist during endoscopic procedure.  Patient ID and intended procedure confirmed with present staff. Received instructions for my participation in the procedure from the performing physician.  

## 2017-02-15 NOTE — Op Note (Signed)
[place] Patient Name: Antonio Russell Procedure Date: 02/15/2017 11:16 AM MRN: [id number] Endoscopist: Ladene Artist , MD Age: 56 Referring MD:  Date of Birth: 23-Apr-1960 Gender: Male Account #: [id number] Procedure:                Colonoscopy Indications:              Surveillance: Personal history of adenomatous                            polyps on last colonoscopy 3 years ago Medicines:                Monitored Anesthesia Care Procedure:                Pre-Anesthesia Assessment:                           - Prior to the procedure, a History and Physical                            was performed, and patient medications and                            allergies were reviewed. The patient's tolerance of                            previous anesthesia was also reviewed. The risks                            and benefits of the procedure and the sedation                            options and risks were discussed with the patient.                            All questions were answered, and informed consent                            was obtained. Prior Anticoagulants: The patient has                            taken no previous anticoagulant or antiplatelet                            agents. ASA Grade Assessment: II - A patient with                            mild systemic disease. After reviewing the risks                            and benefits, the patient was deemed in                            satisfactory condition to undergo the procedure.  After obtaining informed consent, the colonoscope                            was passed under direct vision. Throughout the                            procedure, the patient's blood pressure, pulse, and                            oxygen saturations were monitored continuously. The                            Colonoscope was introduced through the anus and                            advanced to the the cecum,  identified by                            appendiceal orifice and ileocecal valve. The                            ileocecal valve, appendiceal orifice, and rectum                            were photographed. The quality of the bowel                            preparation was good after extensive lavage and                            suctioning. The colonoscopy was performed without                            difficulty. The patient tolerated the procedure                            well. Scope In: 11:24:29 AM Scope Out: 11:41:02 AM Scope Withdrawal Time: 0 hours 13 minutes 54 seconds  Total Procedure Duration: 0 hours 16 minutes 33 seconds  Findings:                 The perianal and digital rectal examinations were                            normal.                           A 12 mm polyp was found in the ascending colon. The                            polyp was sessile. The polyp was removed with a hot                            snare. Resection and retrieval were complete.  Three sessile polyps were found in the sigmoid                            colon and ascending colon. The polyps were 6 to 7                            mm in size. These polyps were removed with a cold                            snare. Resection and retrieval were complete.                           Internal hemorrhoids were found during                            retroflexion. The hemorrhoids were small and Grade                            I (internal hemorrhoids that do not prolapse).                           The exam was otherwise without abnormality on                            direct and retroflexion views. Complications:            No immediate complications. Estimated blood loss:                            None. Estimated Blood Loss:     Estimated blood loss: none. Impression:               - One 12 mm polyp in the ascending colon, removed                            with a hot  snare. Resected and retrieved.                           - Three 6 to 7 mm polyps in the sigmoid colon and                            in the ascending colon, removed with a cold snare.                            Resected and retrieved.                           - Internal hemorrhoids.                           - The examination was otherwise normal on direct                            and retroflexion views. Recommendation:           -  Repeat colonoscopy in 3 - 5 years for                            surveillance pending pathology review.                           - Patient has a contact number available for                            emergencies. The signs and symptoms of potential                            delayed complications were discussed with the                            patient. Return to normal activities tomorrow.                            Written discharge instructions were provided to the                            patient.                           - Resume previous diet.                           - Continue present medications.                           - Await pathology results.                           - No aspirin, ibuprofen, naproxen, or other                            non-steroidal anti-inflammatory drugs for 2 weeks                            after polyp removal. Ladene Artist, MD 02/15/2017 11:47:39 AM This report has been signed electronically.

## 2017-02-15 NOTE — Progress Notes (Signed)
To recovery, report to RN, VSS. 

## 2017-02-18 ENCOUNTER — Telehealth: Payer: Self-pay | Admitting: *Deleted

## 2017-02-18 ENCOUNTER — Telehealth: Payer: Self-pay

## 2017-02-18 NOTE — Telephone Encounter (Signed)
Called pt- answering machine- left message we will try back this afternoon- Lenard Galloway RN

## 2017-02-18 NOTE — Telephone Encounter (Signed)
  Follow up Call-  Call back number 02/15/2017  Post procedure Call Back phone  # [phone]  Permission to leave phone message Yes  Some recent data might be hidden     Patient questions:  Do you have a fever, pain , or abdominal swelling? No. Pain Score  0 *  Have you tolerated food without any problems? Yes.    Have you been able to return to your normal activities? Yes.    Do you have any questions about your discharge instructions: Diet   No. Medications  No. Follow up visit  No.  Do you have questions or concerns about your Care? No.  Actions: * If pain score is 4 or above: No action needed, pain <4.  No problems noted per pt. maw

## 2017-02-27 ENCOUNTER — Encounter: Payer: Self-pay | Admitting: Gastroenterology

## 2017-03-23 ENCOUNTER — Ambulatory Visit (INDEPENDENT_AMBULATORY_CARE_PROVIDER_SITE_OTHER): Payer: Managed Care, Other (non HMO) | Admitting: Family Medicine

## 2017-03-23 ENCOUNTER — Encounter: Payer: Self-pay | Admitting: Family Medicine

## 2017-03-23 VITALS — BP 140/79 | HR 87 | Temp 98.8°F | Resp 16 | Ht 72.0 in | Wt 186.0 lb

## 2017-03-23 DIAGNOSIS — R05 Cough: Secondary | ICD-10-CM

## 2017-03-23 DIAGNOSIS — J324 Chronic pansinusitis: Secondary | ICD-10-CM

## 2017-03-23 DIAGNOSIS — R059 Cough, unspecified: Secondary | ICD-10-CM

## 2017-03-23 MED ORDER — AMOXICILLIN-POT CLAVULANATE 875-125 MG PO TABS: 1.0000 | ORAL_TABLET | Freq: Two times a day (BID) | ORAL | 0 refills | Status: DC

## 2017-03-23 MED ORDER — FLUTICASONE PROPIONATE 50 MCG/ACT NA SUSP: 2.0000 | Freq: Every day | NASAL | 6 refills | Status: DC

## 2017-03-23 MED ORDER — PROMETHAZINE-DM 6.25-15 MG/5ML PO SYRP: 5.0000 mL | ORAL_SOLUTION | Freq: Four times a day (QID) | ORAL | 0 refills | Status: DC | PRN

## 2017-03-23 NOTE — Progress Notes (Signed)
Patient ID: Antonio Russell, male    DOB: 10/02/1960  Age: 56 y.o. MRN: [id number]    Subjective:  Subjective  HPI Antonio Russell presents for sinus congestion and cough x 2 weeks   Took nyquil and zyrtec with no relief.  No fevers  Review of Systems  Constitutional: Negative for chills and fever.  HENT: Positive for congestion, postnasal drip, rhinorrhea and sinus pressure.   Respiratory: Positive for cough and wheezing. Negative for chest tightness and shortness of breath.   Cardiovascular: Negative for chest pain, palpitations and leg swelling.  Allergic/Immunologic: Negative for environmental allergies.    History Antonio Russell has a past medical history of Allergic rhinitis, Allergy, Anxiety, Arthritis, Back pain, Chest pain (2012), GERD (gastroesophageal reflux disease), Headache(784.0), HLD (hyperlipidemia), Impotence of organic origin, Other testicular hypofunction, Personal history of urinary calculi, Sleep apnea, Tobacco use disorder, and Unspecified hearing loss.   He has a past surgical history that includes Colonoscopy; Hemorrhoid surgery; Nasal septum surgery; and Knee surgery (Right, 2013).   Hisfamily history includes Cancer in his father; Diabetes in his mother; Esophageal cancer in his paternal uncle; Heart disease in his unknown relative; Hyperlipidemia in his father and mother; Hypertension in his father, mother, and sister; Lung cancer in his unknown relative.He reports that he has been smoking cigarettes.  He has a 35.00 pack-year smoking history. He has quit using smokeless tobacco. He reports that he drinks alcohol. He reports that he does not use drugs.  Current Outpatient Medications on File Prior to Visit  Medication Sig Dispense Refill  . cetirizine (ZYRTEC) 10 MG tablet Take 10 mg by mouth as needed for allergies.    . hydrocortisone (ANUSOL-HC) 2.5 % rectal cream Place 1 application rectally 3 (three) times daily. To affected area 30 g 1  . testosterone cypionate  (DEPOTESTOTERONE CYPIONATE) 100 MG/ML injection Inject into the muscle every 28 (twenty-eight) days. For IM use only    . traMADol (ULTRAM) 50 MG tablet Take 1 tablet (50 mg total) by mouth every 8 (eight) hours as needed for severe pain. Caution of sedation and constipation 20 tablet 0  . varenicline (CHANTIX PAK) 0.5 MG X 11 & 1 MG X 42 tablet Take one 0.5 mg tablet by mouth once daily for 3 days, then increase to one 0.5 mg tablet twice daily for 4 days, then increase to one 1 mg tablet twice daily. 53 tablet 0  . varenicline (CHANTIX) 1 MG tablet Take 1 tablet (1 mg total) by mouth 2 (two) times daily. 60 tablet 3   No current facility-administered medications on file prior to visit.      Objective:  Objective  Physical Exam  Constitutional: He is oriented to person, place, and time. He appears well-developed and well-nourished.  HENT:  Right Ear: External ear normal.  Left Ear: External ear normal.  Nose: Rhinorrhea present. Right sinus exhibits no maxillary sinus tenderness and no frontal sinus tenderness. Left sinus exhibits no maxillary sinus tenderness and no frontal sinus tenderness.  Mouth/Throat: Posterior oropharyngeal erythema present. No oropharyngeal exudate.  + PND + errythema  Eyes: Conjunctivae are normal. Right eye exhibits no discharge. Left eye exhibits no discharge.  Cardiovascular: Normal rate, regular rhythm and normal heart sounds.  No murmur heard. Pulmonary/Chest: Effort normal. No respiratory distress. He has decreased breath sounds in the left upper field. He has no wheezes. He has no rales. He exhibits no tenderness.  Musculoskeletal: He exhibits no edema.  Lymphadenopathy:    He has cervical  adenopathy.  Neurological: He is alert and oriented to person, place, and time.  Nursing note and vitals reviewed.  BP 140/79 (BP Location: Left Arm, Patient Position: Sitting, Cuff Size: Large)   Pulse 87   Temp 98.8 F (37.1 C) (Oral)   Resp 16   Ht 6' (1.829 m)    Wt 186 lb (84.4 kg)   SpO2 (!) 14%   BMI 25.23 kg/m  Wt Readings from Last 3 Encounters:  03/23/17 186 lb (84.4 kg)  02/15/17 185 lb (83.9 kg)  02/01/17 185 lb 9.6 oz (84.2 kg)     Lab Results  Component Value Date   WBC 9.9 10/26/2016   HGB 17.7 (H) 10/26/2016   HCT 53.7 (H) 10/26/2016   PLT 251.0 10/26/2016   GLUCOSE 110 (H) 10/26/2016   CHOL 217 (H) 10/26/2016   TRIG 91.0 10/26/2016   HDL 43.60 10/26/2016   LDLDIRECT 150.5 07/25/2009   LDLCALC 155 (H) 10/26/2016   ALT 16 10/26/2016   AST 17 10/26/2016   NA 140 10/26/2016   K 5.0 10/26/2016   CL 106 10/26/2016   CREATININE 1.00 10/26/2016   BUN 10 10/26/2016   CO2 29 10/26/2016   TSH 0.96 10/26/2016   PSA 0.72 10/26/2016   INR 0.99 07/05/2010   HGBA1C (H) 07/05/2010    5.7 (NOTE)                                                                       According to the ADA Clinical Practice Recommendations for 2011, when HbA1c is used as a screening test:   >=6.5%   Diagnostic of Diabetes Mellitus           (if abnormal result  is confirmed)  5.7-6.4%   Increased risk of developing Diabetes Mellitus  References:Diagnosis and Classification of Diabetes Mellitus,Diabetes GXQJ,1941,74(YCXKG 1):S62-S69 and Standards of Medical Care in         Diabetes - 2011,Diabetes Care,2011,34  (Suppl 1):S11-S61.    Ct Abdomen Pelvis W Contrast  Result Date: 11/11/2014 CLINICAL DATA:  Right side abdominal pain, diarrhea EXAM: CT ABDOMEN AND PELVIS WITH CONTRAST TECHNIQUE: Multidetector CT imaging of the abdomen and pelvis was performed using the standard protocol following bolus administration of intravenous contrast. CONTRAST:  168mL OMNIPAQUE IOHEXOL 300 MG/ML  SOLN COMPARISON:  Eft FINDINGS: Lung bases are unremarkable. Sagittal images of the spine shows disc space flattening vacuum disc phenomenon mild anterior and mild posterior spurring at L5-S1 level. Enhanced liver is unremarkable. Gallbladder is contracted without evidence of  calcified gallstones. Mild atherosclerotic plaques and calcifications are noted distal abdominal aorta and iliac arteries. There is a cyst in left hepatic lobe measures 7.5 mm. The pancreas, spleen and adrenal glands are unremarkable. Kidneys are symmetrical in size and enhancement. No hydronephrosis or hydroureter. Moderate stool noted within cecum. The terminal ileum is unremarkable. No pericecal inflammation. The appendix is not identified. There is no small bowel obstruction. No thickened or dilated small bowel loops. No ascites or free air. No adenopathy. Mild distended urinary bladder. Prostate gland and seminal vesicles are unremarkable. There is no inguinal adenopathy. No destructive bony lesions are noted within pelvis. Mild degenerative changes bilateral SI joints. Delayed renal images shows bilateral renal symmetrical excretion. Bilateral  visualized proximal ureter is unremarkable. IMPRESSION: 1. There is no acute inflammatory process within abdomen or pelvis. 2. No hydronephrosis or hydroureter. Bilateral renal symmetrical excretion. 3. Moderate stool noted within cecum. No pericecal inflammation. The appendix is not identified. 4. No small bowel obstruction. 5. Degenerative changes lumbar spine and bilateral SI joints. Electronically Signed   By: Lahoma Crocker M.D.   On: 11/11/2014 12:28     Assessment & Plan:  Plan  Antonio Russell was seen today for cough.  Diagnoses and all orders for this visit:  Pansinusitis, unspecified chronicity -     amoxicillin-clavulanate (AUGMENTIN) 875-125 MG tablet; Take 1 tablet by mouth 2 (two) times daily. -     fluticasone (FLONASE) 50 MCG/ACT nasal spray; Place 2 sprays into both nostrils daily.  Cough -     promethazine-dextromethorphan (PROMETHAZINE-DM) 6.25-15 MG/5ML syrup; Take 5 mLs by mouth 4 (four) times daily as needed.   I am having Antonio Russell start on amoxicillin-clavulanate, fluticasone, and promethazine-dextromethorphan. I am also having him  maintain his testosterone cypionate, cetirizine, varenicline, varenicline, hydrocortisone, and traMADol.  Meds ordered this encounter  Medications  . amoxicillin-clavulanate (AUGMENTIN) 875-125 MG tablet    Sig: Take 1 tablet by mouth 2 (two) times daily.    Dispense:  20 tablet    Refill:  0  . fluticasone (FLONASE) 50 MCG/ACT nasal spray    Sig: Place 2 sprays into both nostrils daily.    Dispense:  16 g    Refill:  6  . promethazine-dextromethorphan (PROMETHAZINE-DM) 6.25-15 MG/5ML syrup    Sig: Take 5 mLs by mouth 4 (four) times daily as needed.    Dispense:  118 mL    Refill:  0      Follow-up: Return if symptoms worsen or fail to improve.  Ann Held, DO

## 2017-03-23 NOTE — Progress Notes (Signed)
Pre visit review using our clinic review tool, if applicable. No additional management support is needed unless otherwise documented below in the visit note. 

## 2017-03-23 NOTE — Patient Instructions (Signed)

## 2017-05-17 ENCOUNTER — Encounter: Payer: Self-pay | Admitting: Family Medicine

## 2017-05-17 ENCOUNTER — Ambulatory Visit (INDEPENDENT_AMBULATORY_CARE_PROVIDER_SITE_OTHER): Payer: 59 | Admitting: Family Medicine

## 2017-05-17 VITALS — BP 122/84 | HR 106 | Temp 98.3°F | Wt 187.0 lb

## 2017-05-17 DIAGNOSIS — F17200 Nicotine dependence, unspecified, uncomplicated: Secondary | ICD-10-CM | POA: Diagnosis not present

## 2017-05-17 DIAGNOSIS — J019 Acute sinusitis, unspecified: Secondary | ICD-10-CM | POA: Diagnosis not present

## 2017-05-17 MED ORDER — GUAIFENESIN-CODEINE 100-10 MG/5ML PO SYRP: 5.0000 mL | ORAL_SOLUTION | Freq: Two times a day (BID) | ORAL | 0 refills | Status: DC | PRN

## 2017-05-17 NOTE — Patient Instructions (Addendum)
You have a sinus infection, likely viral.  Take medicine as prescribed: codeine cough syrup for night time.  Ibuprofen 400mg  with meals for next 3-4 days, start taking flonase nasal steroid for sinus inflammation.  Push fluids and plenty of rest.  Nasal saline irrigation or neti pot to help drain sinuses. May use plain mucinex (guaifenesin) with plenty of fluid to help mobilize mucous. Please let us know if fever >101.5, trouble opening/closing mouth, difficulty swallowing, or worsening instead of improving as expected.

## 2017-05-17 NOTE — Assessment & Plan Note (Signed)
Anticipate viral given short duration. Supportive care reviewed - mucinex, fluids, rest, flonase, ibuprofen x 3-4 days short course. Red flags to update [place] for abx course reviewed. Pt agrees with plan.

## 2017-05-17 NOTE — Progress Notes (Signed)
BP 122/84 (BP Location: Left Arm, Patient Position: Sitting, Cuff Size: Normal)   Pulse (!) 106   Temp 98.3 F (36.8 C) (Oral)   Wt 187 lb (84.8 kg)   SpO2 97%   BMI 25.36 kg/m    CC: flu symptoms Subjective:    Patient ID: Antonio Russell, male    DOB: 09-Sep-1960, 57 y.o.   MRN: [id number]  HPI: Antonio Russell is a 57 y.o. male presenting on 05/17/2017 for URI (Nasal congestion, sinus pressure, drainage and productive cough. Started about 1 wk ago. Tried Tylenol and Theraflu.)   5-6d h/o scratchy throat, lost voice x several days, squeezing headache, rhinorrhea and congestion worse in the morning, productive cough and chills worse at night. Significant head congestion. Stayed home from work Monday. Felt better Tuesday, then Wednesday deteriorated again.   No fevers, dyspnea or wheezing.   So far has tried zyrtec, theraflu.  Current smoker + sick contacts at work.  No h/o asthma.   He did have sinusitis 03/2017 - treated with augmentin.   Relevant past medical, surgical, family and social history reviewed and updated as indicated. Interim medical history since our last visit reviewed. Allergies and medications reviewed and updated. Outpatient Medications Prior to Visit  Medication Sig Dispense Refill  . amoxicillin-clavulanate (AUGMENTIN) 875-125 MG tablet Take 1 tablet by mouth 2 (two) times daily. 20 tablet 0  . cetirizine (ZYRTEC) 10 MG tablet Take 10 mg by mouth as needed for allergies.    . fluticasone (FLONASE) 50 MCG/ACT nasal spray Place 2 sprays into both nostrils daily. 16 g 6  . hydrocortisone (ANUSOL-HC) 2.5 % rectal cream Place 1 application rectally 3 (three) times daily. To affected area 30 g 1  . promethazine-dextromethorphan (PROMETHAZINE-DM) 6.25-15 MG/5ML syrup Take 5 mLs by mouth 4 (four) times daily as needed. 118 mL 0  . testosterone cypionate (DEPOTESTOTERONE CYPIONATE) 100 MG/ML injection Inject into the muscle every 28 (twenty-eight) days. For IM use only     . traMADol (ULTRAM) 50 MG tablet Take 1 tablet (50 mg total) by mouth every 8 (eight) hours as needed for severe pain. Caution of sedation and constipation 20 tablet 0  . varenicline (CHANTIX PAK) 0.5 MG X 11 & 1 MG X 42 tablet Take one 0.5 mg tablet by mouth once daily for 3 days, then increase to one 0.5 mg tablet twice daily for 4 days, then increase to one 1 mg tablet twice daily. 53 tablet 0  . varenicline (CHANTIX) 1 MG tablet Take 1 tablet (1 mg total) by mouth 2 (two) times daily. 60 tablet 3   No facility-administered medications prior to visit.      Per HPI unless specifically indicated in ROS section below Review of Systems     Objective:    BP 122/84 (BP Location: Left Arm, Patient Position: Sitting, Cuff Size: Normal)   Pulse (!) 106   Temp 98.3 F (36.8 C) (Oral)   Wt 187 lb (84.8 kg)   SpO2 97%   BMI 25.36 kg/m   Wt Readings from Last 3 Encounters:  05/17/17 187 lb (84.8 kg)  03/23/17 186 lb (84.4 kg)  02/15/17 185 lb (83.9 kg)    Physical Exam  Constitutional: He appears well-developed and well-nourished. No distress.  HENT:  Head: Normocephalic and atraumatic.  Right Ear: Hearing, tympanic membrane, external ear and ear canal normal.  Left Ear: Hearing, tympanic membrane, external ear and ear canal normal.  Nose: Mucosal edema present. No rhinorrhea. Right  sinus exhibits maxillary sinus tenderness. Right sinus exhibits no frontal sinus tenderness. Left sinus exhibits maxillary sinus tenderness. Left sinus exhibits no frontal sinus tenderness.  Mouth/Throat: Uvula is midline and mucous membranes are normal. Posterior oropharyngeal edema and posterior oropharyngeal erythema (marked) present. No oropharyngeal exudate or tonsillar abscesses.  Eyes: Conjunctivae and EOM are normal. Pupils are equal, round, and reactive to light. No scleral icterus.  Neck: Normal range of motion. Neck supple.  Cardiovascular: Normal rate, regular rhythm, normal heart sounds and  intact distal pulses.  No murmur heard. Pulmonary/Chest: Effort normal and breath sounds normal. No respiratory distress. He has no wheezes. He has no rales.  Lungs clear  Lymphadenopathy:    He has no cervical adenopathy.  Skin: Skin is warm and dry. No rash noted.  Nursing note and vitals reviewed.     Assessment & Plan:   Problem List Items Addressed This Visit    Acute sinusitis - Primary    Anticipate viral given short duration. Supportive care reviewed - mucinex, fluids, rest, flonase, ibuprofen x 3-4 days short course. Red flags to update [place] for abx course reviewed. Pt agrees with plan.       Relevant Medications   guaiFENesin-codeine (CHERATUSSIN AC) 100-10 MG/5ML syrup   TOBACCO USE    Encouraged ongoing smoking cessation attempts.           Meds ordered this encounter  Medications  . guaiFENesin-codeine (CHERATUSSIN AC) 100-10 MG/5ML syrup    Sig: Take 5 mLs by mouth 2 (two) times daily as needed.    Dispense:  120 mL    Refill:  0   No orders of the defined types were placed in this encounter.   Follow up plan: Return if symptoms worsen or fail to improve.  Ria Bush, MD

## 2017-05-17 NOTE — Assessment & Plan Note (Signed)
Encouraged ongoing smoking cessation attempts.

## 2017-11-04 ENCOUNTER — Encounter: Payer: Self-pay | Admitting: Family Medicine

## 2017-11-04 ENCOUNTER — Ambulatory Visit (INDEPENDENT_AMBULATORY_CARE_PROVIDER_SITE_OTHER): Payer: 59 | Admitting: Family Medicine

## 2017-11-04 VITALS — BP 168/98 | HR 91 | Temp 98.4°F | Ht 72.0 in | Wt 180.5 lb

## 2017-11-04 DIAGNOSIS — I10 Essential (primary) hypertension: Secondary | ICD-10-CM | POA: Insufficient documentation

## 2017-11-04 DIAGNOSIS — R519 Headache, unspecified: Secondary | ICD-10-CM | POA: Insufficient documentation

## 2017-11-04 DIAGNOSIS — R51 Headache: Secondary | ICD-10-CM | POA: Diagnosis not present

## 2017-11-04 DIAGNOSIS — E349 Endocrine disorder, unspecified: Secondary | ICD-10-CM | POA: Diagnosis not present

## 2017-11-04 MED ORDER — AMLODIPINE BESYLATE 5 MG PO TABS: 5.0000 mg | ORAL_TABLET | Freq: Every day | ORAL | 6 refills | Status: DC

## 2017-11-04 NOTE — Assessment & Plan Note (Addendum)
Not consistent with migraine. Concern for HTN related headaches. Start amlodipine, monitor for improvement. If no better, low threshold to further evaluate (consider head CT) given headache can wake him up from sleep.  I did ask him to get his vision checked as well.

## 2017-11-04 NOTE — Patient Instructions (Addendum)
Headache may be coming from blood high pressure Start amlodipine 5mg  daily. Watch for ankle swelling.  Labs today. Return in 1 month for follow up Let us know sooner if headaches not improving with better blood pressure control. Monitor blood pressures more frequently at home.  EKG today.

## 2017-11-04 NOTE — Progress Notes (Addendum)
BP (!) 168/98 (BP Location: Right Arm, Cuff Size: Normal)   Pulse 91   Temp 98.4 F (36.9 C) (Oral)   Ht 6' (1.829 m)   Wt 180 lb 8 oz (81.9 kg)   SpO2 96%   BMI 24.48 kg/m    CC: headache Subjective:    Patient ID: Antonio Russell, male    DOB: [birth year], 58 y.o.   MRN: [id number]  HPI: Antonio Russell is a 57 y.o. male presenting on 11/04/2017 for Headache (C/o HA off and on for 2 mo. H/o migraines.  Tried Excedrin Migraine. )   2 mo h/o intermittent headache L>R parietal region describes throbbing pain, worse with bending over or straining (ie sneezing causes headache to worsen, states a few seconds after sneeze). He's been treating with excedrin migraine with temporary relief. Most days has headache for a portion of the day. Some bilateral jaw tension/stiffness. Headache can wake him up at night.   No fevers/chills, photo/phonophobia, nausea, vision changes.  H/o migraines, none in years. These headaches feel different.   Intermittent zyrtec/flonase use.  He regularly takes testosterone cypionate 100mg  Q2 wks (through urology). He's actually held injections for the last month worried this was causing headache.   He checked BP at home this morning - 136/86.  Current smoker - <1ppd  He has started cutting down on salt/sodium over the last 1.5 months.  BP Readings from Last 3 Encounters:  11/04/17 (!) 168/98  05/17/17 122/84  03/23/17 140/79    Relevant past medical, surgical, family and social history reviewed and updated as indicated. Interim medical history since our last visit reviewed. Allergies and medications reviewed and updated. Outpatient Medications Prior to Visit  Medication Sig Dispense Refill  . cetirizine (ZYRTEC) 10 MG tablet Take 10 mg by mouth as needed for allergies.    . fluticasone (FLONASE) 50 MCG/ACT nasal spray Place 2 sprays into both nostrils daily. 16 g 6  . hydrocortisone (ANUSOL-HC) 2.5 % rectal cream Place 1 application rectally 3 (three) times  daily. To affected area 30 g 1  . testosterone cypionate (DEPOTESTOTERONE CYPIONATE) 100 MG/ML injection Inject into the muscle every 28 (twenty-eight) days. For IM use only    . amoxicillin-clavulanate (AUGMENTIN) 875-125 MG tablet Take 1 tablet by mouth 2 (two) times daily. 20 tablet 0  . guaiFENesin-codeine (CHERATUSSIN AC) 100-10 MG/5ML syrup Take 5 mLs by mouth 2 (two) times daily as needed. 120 mL 0  . promethazine-dextromethorphan (PROMETHAZINE-DM) 6.25-15 MG/5ML syrup Take 5 mLs by mouth 4 (four) times daily as needed. 118 mL 0  . traMADol (ULTRAM) 50 MG tablet Take 1 tablet (50 mg total) by mouth every 8 (eight) hours as needed for severe pain. Caution of sedation and constipation 20 tablet 0  . varenicline (CHANTIX PAK) 0.5 MG X 11 & 1 MG X 42 tablet Take one 0.5 mg tablet by mouth once daily for 3 days, then increase to one 0.5 mg tablet twice daily for 4 days, then increase to one 1 mg tablet twice daily. 53 tablet 0  . varenicline (CHANTIX) 1 MG tablet Take 1 tablet (1 mg total) by mouth 2 (two) times daily. 60 tablet 3   No facility-administered medications prior to visit.      Per HPI unless specifically indicated in ROS section below Review of Systems     Objective:    BP (!) 168/98 (BP Location: Right Arm, Cuff Size: Normal)   Pulse 91   Temp 98.4 F (36.9 C) (  Oral)   Ht 6' (1.829 m)   Wt 180 lb 8 oz (81.9 kg)   SpO2 96%   BMI 24.48 kg/m   Wt Readings from Last 3 Encounters:  11/04/17 180 lb 8 oz (81.9 kg)  05/17/17 187 lb (84.8 kg)  03/23/17 186 lb (84.4 kg)    Physical Exam  Constitutional: He appears well-developed and well-nourished. No distress.  HENT:  Head: Normocephalic and atraumatic.  Mouth/Throat: Oropharynx is clear and moist.  Eyes: Pupils are equal, round, and reactive to light. Conjunctivae and EOM are normal.  No obvious papilledema  Neck: Normal range of motion. Neck supple.  Cardiovascular: Normal rate, regular rhythm and normal heart sounds.   No murmur heard. Pulmonary/Chest: Effort normal and breath sounds normal. No respiratory distress. He has no wheezes. He has no rales.  Musculoskeletal: Normal range of motion. He exhibits no edema.  Neurological: He is alert. He has normal strength. No cranial nerve deficit or sensory deficit. Coordination and gait normal.  CN 2-12 intact Station and gait intact FTN intact EOMI  Skin: Skin is warm.  Nursing note and vitals reviewed.  Results for orders placed or performed in visit on 10/31/16  Testosterone  Result Value Ref Range   Testosterone 373.51 300.00 - 890.00 ng/dL   EKG - NSR rate 75, normal axis, intervals, no acute ST/T changes.     Assessment & Plan:   Problem List Items Addressed This Visit    Testosterone deficiency    Prescribed by urology. Holding testosterone for now.       Relevant Orders   CBC with Differential/Platelet   Comprehensive metabolic panel   Headache - Primary    Not consistent with migraine. Concern for HTN related headaches. Start amlodipine, monitor for improvement. If no better, low threshold to further evaluate (consider head CT) given headache can wake him up from sleep.  I did ask him to get his vision checked as well.       Relevant Medications   amLODipine (NORVASC) 5 MG tablet   Elevated blood pressure reading in office with diagnosis of hypertension    I'm worried that this is the cause of his headaches. Unsure if home cuff is accurate. Check labs today. Baseline EKG today.  I did ask him to start monitoring blood pressures more closely at home. Will start amlodipine 5mg  daily. Discussed watching for flushing and ankle swelling.  Rec RTC 1 mo f/u visit.       Relevant Medications   amLODipine (NORVASC) 5 MG tablet   Other Relevant Orders   TSH   Comprehensive metabolic panel       Meds ordered this encounter  Medications  . amLODipine (NORVASC) 5 MG tablet    Sig: Take 1 tablet (5 mg total) by mouth daily.    Dispense:   30 tablet    Refill:  6   Orders Placed This Encounter  Procedures  . TSH  . CBC with Differential/Platelet  . Comprehensive metabolic panel    Follow up plan: Return in about 1 month (around 12/02/2017) for follow up visit.  Ria Bush, MD

## 2017-11-04 NOTE — Assessment & Plan Note (Addendum)
I'm worried that this is the cause of his headaches. Unsure if home cuff is accurate. Check labs today. Baseline EKG today.  I did ask him to start monitoring blood pressures more closely at home. Will start amlodipine 5mg  daily. Discussed watching for flushing and ankle swelling.  Rec RTC 1 mo f/u visit.

## 2017-11-04 NOTE — Addendum Note (Signed)
Addended by: Brenton Grills on: 11/06/8255 49:35 PM   Modules accepted: Orders

## 2017-11-04 NOTE — Assessment & Plan Note (Signed)
Prescribed by urology. Holding testosterone for now.

## 2017-11-05 LAB — CBC WITH DIFFERENTIAL/PLATELET
Basophils Absolute: 0.1 10*3/uL (ref 0.0–0.1)
Basophils Relative: 1.2 % (ref 0.0–3.0)
EOS PCT: 4.7 % (ref 0.0–5.0)
Eosinophils Absolute: 0.5 10*3/uL (ref 0.0–0.7)
HCT: 48.2 % (ref 39.0–52.0)
Hemoglobin: 16.4 g/dL (ref 13.0–17.0)
LYMPHS ABS: 3.5 10*3/uL (ref 0.7–4.0)
Lymphocytes Relative: 32.6 % (ref 12.0–46.0)
MCHC: 33.9 g/dL (ref 30.0–36.0)
MCV: 99 fl (ref 78.0–100.0)
MONO ABS: 1.3 10*3/uL — AB (ref 0.1–1.0)
Monocytes Relative: 12.1 % — ABNORMAL HIGH (ref 3.0–12.0)
NEUTROS PCT: 49.4 % (ref 43.0–77.0)
Neutro Abs: 5.3 10*3/uL (ref 1.4–7.7)
Platelets: 266 10*3/uL (ref 150.0–400.0)
RBC: 4.86 Mil/uL (ref 4.22–5.81)
RDW: 13.8 % (ref 11.5–15.5)
WBC: 10.7 10*3/uL — ABNORMAL HIGH (ref 4.0–10.5)

## 2017-11-05 LAB — COMPREHENSIVE METABOLIC PANEL
ALK PHOS: 80 U/L (ref 39–117)
ALT: 25 U/L (ref 0–53)
AST: 20 U/L (ref 0–37)
Albumin: 4.3 g/dL (ref 3.5–5.2)
BUN: 15 mg/dL (ref 6–23)
CHLORIDE: 103 meq/L (ref 96–112)
CO2: 27 mEq/L (ref 19–32)
Calcium: 11.5 mg/dL — ABNORMAL HIGH (ref 8.4–10.5)
Creatinine, Ser: 1.18 mg/dL (ref 0.40–1.50)
GFR: 67.66 mL/min (ref >60.00)
GLUCOSE: 95 mg/dL (ref 70–99)
POTASSIUM: 4.7 meq/L (ref 3.5–5.1)
Sodium: 137 mEq/L (ref 135–145)
TOTAL PROTEIN: 7.3 g/dL (ref 6.0–8.3)
Total Bilirubin: 0.5 mg/dL (ref 0.2–1.2)

## 2017-11-05 LAB — TSH: TSH: 1.19 u[IU]/mL (ref 0.35–4.50)

## 2017-11-08 ENCOUNTER — Other Ambulatory Visit: Payer: Self-pay | Admitting: Family Medicine

## 2017-11-08 ENCOUNTER — Encounter: Payer: Self-pay | Admitting: Family Medicine

## 2017-11-12 ENCOUNTER — Other Ambulatory Visit: Payer: Self-pay | Admitting: Family Medicine

## 2017-11-12 DIAGNOSIS — R51 Headache: Principal | ICD-10-CM

## 2017-11-12 DIAGNOSIS — R519 Headache, unspecified: Secondary | ICD-10-CM

## 2017-11-15 ENCOUNTER — Ambulatory Visit
Admission: RE | Admit: 2017-11-15 | Discharge: 2017-11-15 | Disposition: A | Discharge status: Home / Self Care | Payer: 59 | Source: Ambulatory Visit | Attending: Family Medicine | Admitting: Family Medicine

## 2017-11-15 DIAGNOSIS — R519 Headache, unspecified: Secondary | ICD-10-CM

## 2017-11-15 DIAGNOSIS — R51 Headache: Principal | ICD-10-CM

## 2017-11-19 ENCOUNTER — Other Ambulatory Visit: Payer: Self-pay | Admitting: Family Medicine

## 2017-11-19 MED ORDER — AMOXICILLIN-POT CLAVULANATE 875-125 MG PO TABS: 1.0000 | ORAL_TABLET | Freq: Two times a day (BID) | ORAL | 0 refills | Status: AC

## 2017-11-19 MED ORDER — CYCLOBENZAPRINE HCL 5 MG PO TABS: 5.0000 mg | ORAL_TABLET | Freq: Two times a day (BID) | ORAL | 0 refills | Status: DC | PRN

## 2017-11-25 ENCOUNTER — Ambulatory Visit (INDEPENDENT_AMBULATORY_CARE_PROVIDER_SITE_OTHER): Payer: 59 | Admitting: Family Medicine

## 2017-11-25 ENCOUNTER — Ambulatory Visit: Payer: Self-pay | Admitting: *Deleted

## 2017-11-25 ENCOUNTER — Encounter: Payer: Self-pay | Admitting: Family Medicine

## 2017-11-25 VITALS — BP 154/98 | HR 94 | Temp 98.2°F | Ht 72.0 in | Wt 181.8 lb

## 2017-11-25 DIAGNOSIS — F17200 Nicotine dependence, unspecified, uncomplicated: Secondary | ICD-10-CM | POA: Diagnosis not present

## 2017-11-25 DIAGNOSIS — R51 Headache: Secondary | ICD-10-CM

## 2017-11-25 DIAGNOSIS — I10 Essential (primary) hypertension: Secondary | ICD-10-CM

## 2017-11-25 DIAGNOSIS — R519 Headache, unspecified: Secondary | ICD-10-CM

## 2017-11-25 MED ORDER — VARENICLINE TARTRATE 0.5 MG X 11 & 1 MG X 42 PO MISC: ORAL | 0 refills | Status: DC

## 2017-11-25 MED ORDER — METOPROLOL SUCCINATE ER 50 MG PO TB24: 50.0000 mg | ORAL_TABLET | Freq: Every day | ORAL | 11 refills | Status: DC

## 2017-11-25 MED ORDER — VARENICLINE TARTRATE 1 MG PO TABS: 1.0000 mg | ORAL_TABLET | Freq: Two times a day (BID) | ORAL | 3 refills | Status: DC

## 2017-11-25 NOTE — Telephone Encounter (Signed)
I will see him then

## 2017-11-25 NOTE — Patient Instructions (Addendum)
Minimize alcohol Drink water - aim for 64 oz per day  Try to keep regular bed and wake times also   Keep working on quitting smoking  Try the chantix- if you have mood change or any intolerable side effects stop it and let me know   Start the metoprolol xl daily (for headache and blood pressure and pulse)  Continue the amlodipine   Follow up in 1-2 weeks

## 2017-11-25 NOTE — Progress Notes (Signed)
Subjective:    Patient ID: Antonio Russell, male    DOB: 1961/01/23, 57 y.o.   MRN: [id number]  HPI  Here for headache and elevated BP   Would also like to quit smoking   Wt Readings from Last 3 Encounters:  11/25/17 181 lb 12.8 oz (82.5 kg)  11/04/17 180 lb 8 oz (81.9 kg)  05/17/17 187 lb (84.8 kg)  eating less- lost 6 lb from feb  24.66 kg/m   Saw Dr Darnell Level 8/5 for HA and bp   (since after easter) CT scan ordered  bp med adj -added amlodipine 5 mg   Ct Head Wo Contrast  Result Date: 11/16/2017 CLINICAL DATA:  Headache for 3 months. Occasional dizziness and confusion EXAM: CT HEAD WITHOUT CONTRAST TECHNIQUE: Contiguous axial images were obtained from the base of the skull through the vertex without intravenous contrast. COMPARISON:  Brain MRI March 13, 2005 FINDINGS: Brain: The ventricles are normal in size and configuration. There is no intracranial mass, hemorrhage, extra-axial fluid collection, or midline shift. Gray-white compartments appear normal. No evident acute infarct. Vascular: No hyperdense vessel. There is mild calcification in the cavernous carotid arteries bilaterally. Skull: Bony calvarium appears intact. Sinuses/Orbits: There is mucosal thickening in several ethmoid air cells. Paranasal sinuses elsewhere are clear. Orbits appear symmetric bilaterally. Other: Mastoid air cells are clear. IMPRESSION: Mild arterial vascular calcification. Mucosal thickening in several ethmoid air cells. Study otherwise unremarkable. Electronically Signed   By: Lowella Grip III M.D.   On: 11/16/2017 20:28    Did put him on abx for this   Vision- has not had vision checked yet  Last eye check 2 y ago-has had one pr of glasses w/o change   Some brain fog  Alcohol intake - 0-5 drinks per day (some days less)    Diet -trying to eat less sodium  Stopped his V8 juice  Less processed foods  Was going to the gym- then walked  Now headaches are keeping him from exercising  Eating more  fish as well     Tried e cig  Smokes 1ppd Wants to try chantix-may be more affordable    Had labs Results for orders placed or performed in visit on 11/04/17  TSH  Result Value Ref Range   TSH 1.19 0.35 - 4.50 uIU/mL  CBC with Differential/Platelet  Result Value Ref Range   WBC 10.7 (H) 4.0 - 10.5 K/uL   RBC 4.86 4.22 - 5.81 Mil/uL   Hemoglobin 16.4 13.0 - 17.0 g/dL   HCT 48.2 39.0 - 52.0 %   MCV 99.0 78.0 - 100.0 fl   MCHC 33.9 30.0 - 36.0 g/dL   RDW 13.8 11.5 - 15.5 %   Platelets 266.0 150.0 - 400.0 K/uL   Neutrophils Relative % 49.4 43.0 - 77.0 %   Lymphocytes Relative 32.6 12.0 - 46.0 %   Monocytes Relative 12.1 (H) 3.0 - 12.0 %   Eosinophils Relative 4.7 0.0 - 5.0 %   Basophils Relative 1.2 0.0 - 3.0 %   Neutro Abs 5.3 1.4 - 7.7 K/uL   Lymphs Abs 3.5 0.7 - 4.0 K/uL   Monocytes Absolute 1.3 (H) 0.1 - 1.0 K/uL   Eosinophils Absolute 0.5 0.0 - 0.7 K/uL   Basophils Absolute 0.1 0.0 - 0.1 K/uL  Comprehensive metabolic panel  Result Value Ref Range   Sodium 137 135 - 145 mEq/L   Potassium 4.7 3.5 - 5.1 mEq/L   Chloride 103 96 - 112 mEq/L  CO2 27 19 - 32 mEq/L   Glucose, Bld 95 70 - 99 mg/dL   BUN 15 6 - 23 mg/dL   Creatinine, Ser 1.18 0.40 - 1.50 mg/dL   Total Bilirubin 0.5 0.2 - 1.2 mg/dL   Alkaline Phosphatase 80 39 - 117 U/L   AST 20 0 - 37 U/L   ALT 25 0 - 53 U/L   Total Protein 7.3 6.0 - 8.3 g/dL   Albumin 4.3 3.5 - 5.2 g/dL   Calcium 11.5 (H) 8.4 - 10.5 mg/dL   GFR 67.66 >60.00 mL/min    Calcium has been high in the past    HTN BP Readings from Last 3 Encounters:  11/25/17 (!) 154/98  11/04/17 (!) 168/98  05/17/17 122/84   Pulse Readings from Last 3 Encounters:  11/25/17 94  11/04/17 91  05/17/17 (!) 106   Flexeril -not helpful and very sedating   As a teen had migraines -but not in a long time    Patient Active Problem List   Diagnosis Date Noted  . Hypercalcemia 11/08/2017  . Headache 11/04/2017  . Elevated blood pressure reading in  office with diagnosis of hypertension 11/04/2017  . External hemorrhoid 11/05/2016  . Alcohol use 11/04/2016  . Elevated glucose level 11/02/2016  . Skin lesion of left leg 11/02/2016  . Routine general medical examination at a health care facility 10/25/2016  . Prostate cancer screening 10/25/2016  . Leukocytosis 11/30/2014  . Abdominal pain, right upper quadrant 11/30/2014  . Arm pain, left 05/08/2011  . Special screening for malignant neoplasms, colon 02/16/2011  . Right leg pain 01/08/2011  . Tachycardia 08/10/2010  . Back pain   . Arthritis   . Essential hypertension 07/13/2010  . CAD (coronary artery disease)   . Testosterone deficiency 05/25/2009  . Hyperlipidemia 05/25/2009  . 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 RENAL CALCULUS, HX OF 05/11/2009  . TOBACCO USE 09/12/2007  . LOSS, HEARING NOS 10/10/2006   Past Medical History:  Diagnosis Date  . Allergic rhinitis   . Allergy   . Anxiety   . Arthritis   . Back pain   . Chest pain 2012   cardiac cath - nonobstructive, nl LV fxn, rec aggressive med management  . GERD (gastroesophageal reflux disease)   . Headache(784.0)   . HLD (hyperlipidemia)   . Impotence of organic origin   . Other testicular hypofunction   . Personal history of urinary calculi   . Sleep apnea   . Tobacco use disorder   . Unspecified hearing loss    Past Surgical History:  Procedure Laterality Date  . COLONOSCOPY    . HEMORRHOID SURGERY    . KNEE SURGERY Right 2013  . NASAL SEPTUM SURGERY     Social History   Tobacco Use  . Smoking status: Current Every Day Smoker    Packs/day: 1.00    Years: 35.00    Pack years: 35.00    Types: Cigarettes  . Smokeless tobacco: Former Systems developer  . Tobacco comment: just recently decreased from 1 PPD-purchased electronic cigarette  Substance Use Topics  . Alcohol use: Yes    Alcohol/week: 0.0 standard drinks    Comment: 6 pack 3 times weekly  . Drug use: No   Family History  Problem Relation  Age of Onset  . Hypertension Mother   . Hyperlipidemia Mother   . Diabetes Mother   . Hypertension Father   . Hyperlipidemia Father   . Cancer Father  throat  . Hypertension Sister   . Lung cancer Unknown        GF  . Heart disease Unknown        GM  . Esophageal cancer Paternal Uncle   . Colon cancer Neg Hx   . Stomach cancer Neg Hx   . Rectal cancer Neg Hx    Allergies  Allergen Reactions  . Crestor [Rosuvastatin Calcium] Other (See Comments)    Muscle aches  . Lipitor [Atorvastatin Calcium] Other (See Comments)    Arm soreness   . Sertraline Hcl Other (See Comments)    REACTION: muscle twitches  . Zocor [Simvastatin - High Dose] Other (See Comments)    Muscle pain    Current Outpatient Medications on File Prior to Visit  Medication Sig Dispense Refill  . amLODipine (NORVASC) 5 MG tablet Take 1 tablet (5 mg total) by mouth daily. 30 tablet 6  . amoxicillin-clavulanate (AUGMENTIN) 875-125 MG tablet Take 1 tablet by mouth 2 (two) times daily for 10 days. 20 tablet 0  . cetirizine (ZYRTEC) 10 MG tablet Take 10 mg by mouth as needed for allergies.    . cyclobenzaprine (FLEXERIL) 5 MG tablet Take 1-2 tablets (5-10 mg total) by mouth 2 (two) times daily as needed (headache). 30 tablet 0  . fluticasone (FLONASE) 50 MCG/ACT nasal spray Place 2 sprays into both nostrils daily. 16 g 6  . hydrocortisone (ANUSOL-HC) 2.5 % rectal cream Place 1 application rectally 3 (three) times daily. To affected area 30 g 1  . testosterone cypionate (DEPOTESTOTERONE CYPIONATE) 100 MG/ML injection Inject into the muscle every 28 (twenty-eight) days. For IM use only     No current facility-administered medications on file prior to visit.     Review of Systems  Constitutional: Negative for activity change, appetite change, fatigue, fever and unexpected weight change.  HENT: Negative for congestion, rhinorrhea, sore throat and trouble swallowing.   Eyes: Negative for pain, redness, itching and  visual disturbance.  Respiratory: Negative for cough, chest tightness, shortness of breath, wheezing and stridor.   Cardiovascular: Negative for chest pain and palpitations.       No palpitations or cp   Gastrointestinal: Negative for abdominal pain, blood in stool, constipation, diarrhea and nausea.  Endocrine: Negative for cold intolerance, heat intolerance, polydipsia and polyuria.  Genitourinary: Negative for difficulty urinating, dysuria, frequency and urgency.  Musculoskeletal: Negative for arthralgias, joint swelling and myalgias.  Skin: Negative for pallor and rash.  Neurological: Positive for headaches. Negative for dizziness, tremors, seizures, syncope, facial asymmetry, speech difficulty, weakness, light-headedness and numbness.  Hematological: Negative for adenopathy. Does not bruise/bleed easily.  Psychiatric/Behavioral: Negative for decreased concentration and dysphoric mood. The patient is not nervous/anxious.        Objective:   Physical Exam  Constitutional: He appears well-developed and well-nourished. No distress.  Well appearing   HENT:  Head: Normocephalic and atraumatic.  Mouth/Throat: Oropharynx is clear and moist.  Eyes: Pupils are equal, round, and reactive to light. Conjunctivae and EOM are normal. Right eye exhibits no nystagmus. Left eye exhibits no nystagmus.  Neck: Normal range of motion. Neck supple. No JVD present. Carotid bruit is not present. No thyromegaly present.  Cardiovascular: Normal rate, regular rhythm, normal heart sounds and intact distal pulses. Exam reveals no gallop.  Pulmonary/Chest: Effort normal and breath sounds normal. No stridor. No respiratory distress. He has no wheezes. He has no rales.  No crackles  Diffusely distant bs No wheeze or prolonged exp phase  Abdominal: Soft. Bowel sounds are normal. He exhibits no distension, no abdominal bruit and no mass. There is no tenderness.  Musculoskeletal: He exhibits no edema or deformity.    Lymphadenopathy:    He has no cervical adenopathy.  Neurological: He is alert. He has normal strength and normal reflexes. He displays normal reflexes. No cranial nerve deficit or sensory deficit. He exhibits normal muscle tone. Coordination normal.  Skin: Skin is warm and dry. No rash noted. He is not diaphoretic.  Psychiatric: He has a normal mood and affect. His mood appears not anxious.          Assessment & Plan:   Problem List Items Addressed This Visit      Cardiovascular and Mediastinum   Essential hypertension - Primary    BP: (!) 154/98    Only mildly improved with amlodipine Will add metoprolol xl 50 mg - in hopes this will also help rapid pulse rate and headaches  inst to call if side effects or problems  Disc etoh avoidance/ DASH eating and good water intake       Relevant Medications   metoprolol succinate (TOPROL-XL) 50 MG 24 hr tablet     Other   Headache    Suspect related to high BP CT scan reviewed / there is atherosclerosis  Enc smoking cessation Does not tolerate statins  Will begin metoprolol xl 50 mg for hA and bp  Will f/u 1-2 wk to re check and titrate If worse inst to call/alert [place] or seek care in ED      Relevant Medications   metoprolol succinate (TOPROL-XL) 50 MG 24 hr tablet   Hypercalcemia    Lab Results  Component Value Date   CALCIUM 11.5 (H) 11/04/2017   Has been mildly elevated in the past  Once bp is improved-consider w/u with PTH/D level       TOBACCO USE    Disc in detail risks of smoking and possible outcomes including copd, vascular/ heart disease, cancer , respiratory and sinus infections  Pt voices understanding He wants to try chantix if affordable  Px written Disc poss side eff-if mood changes inst to contact us (or other intolerable side eff)

## 2017-11-25 NOTE — Assessment & Plan Note (Signed)
Lab Results  Component Value Date   CALCIUM 11.5 (H) 11/04/2017   Has been mildly elevated in the past  Once bp is improved-consider w/u with PTH/D level

## 2017-11-25 NOTE — Assessment & Plan Note (Signed)
BP: (!) 154/98    Only mildly improved with amlodipine Will add metoprolol xl 50 mg - in hopes this will also help rapid pulse rate and headaches  inst to call if side effects or problems  Disc etoh avoidance/ DASH eating and good water intake

## 2017-11-25 NOTE — Telephone Encounter (Signed)
Pt called with complaints of having headaches and BP 148/100; he says that everything is fine until he gets up, then the headache is pounding; he says it starts in the back of his neck to the front of his head;  He also says that the flexeril that was prescribed did not help his symptoms; recommendations made per nurse triage protocol to include seeing a physician within 24 hours; pt offered and accepted appointment with Dr Glori Bickers, [facility] [place] today at 1500; he verbalizes understanding; will route to office for notification of this upcoming appointment.   Reason for Disposition . [1] SEVERE headache (e.g., excruciating) AND [2] not improved after 2 hours of pain medicine  Answer Assessment - Initial Assessment Questions 1. LOCATION: "Where does it hurt?"      Back of neck to front of head 2. ONSET: "When did the headache start?" (Minutes, hours or days)      days 3. PATTERN: "Does the pain come and go, or has it been constant since it started?"     constant 4. SEVERITY: "How bad is the pain?" and "What does it keep you from doing?"  (e.g., Scale 1-10; mild, moderate, or severe)   - MILD (1-3): doesn't interfere with normal activities    - MODERATE (4-7): interferes with normal activities or awakens from sleep    - SEVERE (8-10): excruciating pain, unable to do any normal activities        Moderate to severe at times 5. RECURRENT SYMPTOM: "Have you ever had headaches before?" If so, ask: "When was the last time?" and "What happened that time?"      Ongoing intermittently  for the past 3-4 months 6. CAUSE: "What do you think is causing the headache?"     unsure 7. MIGRAINE: "Have you been diagnosed with migraine headaches?" If so, ask: "Is this headache similar?"      Yes, when he was a teenager 8. HEAD INJURY: "Has there been any recent injury to the head?"      no 9. OTHER SYMPTOMS: "Do you have any other symptoms?" (fever, stiff neck, eye pain, sore throat, cold symptoms)     Eye pain;  hurts to laugh and cough; BP 148/110 today at 0600 taken on right arm digital cuff (not sure if headache is due to BP or BP is due to headache 10. PREGNANCY: "Is there any chance you are pregnant?" "When was your last menstrual period?"       n/a  Protocols used: HEADACHE-A-AH

## 2017-11-25 NOTE — Assessment & Plan Note (Signed)
Suspect related to high BP CT scan reviewed / there is atherosclerosis  Enc smoking cessation Does not tolerate statins  Will begin metoprolol xl 50 mg for hA and bp  Will f/u 1-2 wk to re check and titrate If worse inst to call/alert [place] or seek care in ED

## 2017-11-25 NOTE — Assessment & Plan Note (Signed)
Disc in detail risks of smoking and possible outcomes including copd, vascular/ heart disease, cancer , respiratory and sinus infections  Pt voices understanding He wants to try chantix if affordable  Px written Disc poss side eff-if mood changes inst to contact us (or other intolerable side eff)

## 2017-12-06 ENCOUNTER — Encounter: Payer: Self-pay | Admitting: Family Medicine

## 2017-12-06 ENCOUNTER — Ambulatory Visit: Payer: 59 | Admitting: Family Medicine

## 2017-12-06 ENCOUNTER — Ambulatory Visit (INDEPENDENT_AMBULATORY_CARE_PROVIDER_SITE_OTHER): Payer: 59 | Admitting: Family Medicine

## 2017-12-06 VITALS — BP 136/84 | HR 71 | Temp 98.2°F | Ht 72.0 in | Wt 184.2 lb

## 2017-12-06 DIAGNOSIS — I10 Essential (primary) hypertension: Secondary | ICD-10-CM

## 2017-12-06 DIAGNOSIS — Z23 Encounter for immunization: Secondary | ICD-10-CM

## 2017-12-06 DIAGNOSIS — R519 Headache, unspecified: Secondary | ICD-10-CM

## 2017-12-06 DIAGNOSIS — R51 Headache: Secondary | ICD-10-CM

## 2017-12-06 DIAGNOSIS — F17200 Nicotine dependence, unspecified, uncomplicated: Secondary | ICD-10-CM | POA: Diagnosis not present

## 2017-12-06 MED ORDER — AMITRIPTYLINE HCL 10 MG PO TABS
30.0000 mg | ORAL_TABLET | Freq: Every day | ORAL | 5 refills | Status: DC
Start: 2017-12-06 — End: 2018-05-30

## 2017-12-06 NOTE — Progress Notes (Signed)
Subjective:    Patient ID: Antonio Russell, male    DOB: Feb 11, 1961, 57 y.o.   MRN: [id number]  HPI Here for HTN f/u  Wt Readings from Last 3 Encounters:  12/06/17 184 lb 4 oz (83.6 kg)  11/25/17 181 lb 12.8 oz (82.5 kg)  11/04/17 180 lb 8 oz (81.9 kg)   24.99 kg/m    bp is stable today  No cp or palpitations or headaches or edema  No side effects to medicines  BP Readings from Last 3 Encounters:  12/06/17 136/84  11/25/17 (!) 154/98  11/04/17 (!) 168/98    Improved bp  Now on amlodipine and metoprolol xl 50  Improved at home also   Pulse Readings from Last 3 Encounters:  12/06/17 71  11/25/17 94  11/04/17 91    Headache - still having daily - improved only slightly    Smoking -he got the chantix filled- has not started taking it yet  Plans to start  Still smoking cigarettes- some days better than others   Flu shot today   Headache is in L side - top of head  Both throbbing and constant  Worse with exertion or if he sneezes or strains  Is sensitive to light  Not much to sound  No n/v    Vision- overall not too bad  The headaches does radiate to L eye occ    He used to have ophthalmic headaches   Allergies are worse also   etoh - 3-4 drinks per day  Caffeine - used to drink 2 c coffee and one soda per day  Now 1 cup coffee and 1 soda   Not a lot of trouble sleeping   Patient Active Problem List   Diagnosis Date Noted  . Chronic daily headache 12/06/2017  . Hypercalcemia 11/08/2017  . Headache 11/04/2017  . External hemorrhoid 11/05/2016  . Alcohol use 11/04/2016  . Elevated glucose level 11/02/2016  . Skin lesion of left leg 11/02/2016  . Routine general medical examination at a health care facility 10/25/2016  . Prostate cancer screening 10/25/2016  . Leukocytosis 11/30/2014  . Special screening for malignant neoplasms, colon 02/16/2011  . Arthritis   . Essential hypertension 07/13/2010  . CAD (coronary artery disease)   . Testosterone  deficiency 05/25/2009  . Hyperlipidemia 05/25/2009  . 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 RENAL CALCULUS, HX OF 05/11/2009  . TOBACCO USE 09/12/2007  . LOSS, HEARING NOS 10/10/2006   Past Medical History:  Diagnosis Date  . Allergic rhinitis   . Allergy   . Anxiety   . Arthritis   . Back pain   . Chest pain 2012   cardiac cath - nonobstructive, nl LV fxn, rec aggressive med management  . GERD (gastroesophageal reflux disease)   . Headache(784.0)   . HLD (hyperlipidemia)   . Impotence of organic origin   . Other testicular hypofunction   . Personal history of urinary calculi   . Sleep apnea   . Tobacco use disorder   . Unspecified hearing loss    Past Surgical History:  Procedure Laterality Date  . COLONOSCOPY    . HEMORRHOID SURGERY    . KNEE SURGERY Right 2013  . NASAL SEPTUM SURGERY     Social History   Tobacco Use  . Smoking status: Current Every Day Smoker    Packs/day: 1.00    Years: 35.00    Pack years: 35.00    Types: Cigarettes  . Smokeless tobacco: Former Systems developer  .  Tobacco comment: just recently decreased from 1 PPD-purchased electronic cigarette  Substance Use Topics  . Alcohol use: Yes    Alcohol/week: 0.0 standard drinks    Comment: 6 pack 3 times weekly  . Drug use: No   Family History  Problem Relation Age of Onset  . Hypertension Mother   . Hyperlipidemia Mother   . Diabetes Mother   . Hypertension Father   . Hyperlipidemia Father   . Cancer Father        throat  . Hypertension Sister   . Lung cancer Unknown        GF  . Heart disease Unknown        GM  . Esophageal cancer Paternal Uncle   . Colon cancer Neg Hx   . Stomach cancer Neg Hx   . Rectal cancer Neg Hx    Allergies  Allergen Reactions  . Crestor [Rosuvastatin Calcium] Other (See Comments)    Muscle aches  . Lipitor [Atorvastatin Calcium] Other (See Comments)    Arm soreness   . Sertraline Hcl Other (See Comments)    REACTION: muscle twitches  . Zocor  [Simvastatin - High Dose] Other (See Comments)    Muscle pain    Current Outpatient Medications on File Prior to Visit  Medication Sig Dispense Refill  . amLODipine (NORVASC) 5 MG tablet Take 1 tablet (5 mg total) by mouth daily. 30 tablet 6  . cetirizine (ZYRTEC) 10 MG tablet Take 10 mg by mouth as needed for allergies.    . cyclobenzaprine (FLEXERIL) 5 MG tablet Take 1-2 tablets (5-10 mg total) by mouth 2 (two) times daily as needed (headache). 30 tablet 0  . fluticasone (FLONASE) 50 MCG/ACT nasal spray Place 2 sprays into both nostrils daily. 16 g 6  . hydrocortisone (ANUSOL-HC) 2.5 % rectal cream Place 1 application rectally 3 (three) times daily. To affected area 30 g 1  . metoprolol succinate (TOPROL-XL) 50 MG 24 hr tablet Take 1 tablet (50 mg total) by mouth daily. Take with or immediately following a meal. 30 tablet 11  . testosterone cypionate (DEPOTESTOTERONE CYPIONATE) 100 MG/ML injection Inject into the muscle every 28 (twenty-eight) days. For IM use only    . varenicline (CHANTIX PAK) 0.5 MG X 11 & 1 MG X 42 tablet Take one 0.5 mg tablet by mouth once daily for 3 days, then increase to one 0.5 mg tablet twice daily for 4 days, then increase to one 1 mg tablet twice daily. 53 tablet 0  . varenicline (CHANTIX) 1 MG tablet Take 1 tablet (1 mg total) by mouth 2 (two) times daily. After finishing the starter pack 60 tablet 3   No current facility-administered medications on file prior to visit.      Review of Systems  Constitutional: Negative for activity change, appetite change, fatigue, fever and unexpected weight change.  HENT: Negative for congestion, rhinorrhea, sore throat and trouble swallowing.   Eyes: Negative for pain, redness, itching and visual disturbance.  Respiratory: Negative for cough, chest tightness, shortness of breath and wheezing.   Cardiovascular: Negative for chest pain and palpitations.  Gastrointestinal: Negative for abdominal pain, blood in stool,  constipation, diarrhea and nausea.  Endocrine: Negative for cold intolerance, heat intolerance, polydipsia and polyuria.  Genitourinary: Negative for difficulty urinating, dysuria, frequency and urgency.  Musculoskeletal: Negative for arthralgias, joint swelling and myalgias.  Skin: Negative for pallor and rash.  Neurological: Positive for headaches. Negative for dizziness, tremors, seizures, syncope, facial asymmetry, speech difficulty, weakness, light-headedness  and numbness.  Hematological: Negative for adenopathy. Does not bruise/bleed easily.  Psychiatric/Behavioral: Negative for decreased concentration and dysphoric mood. The patient is not nervous/anxious.        Objective:   Physical Exam  Constitutional: He is oriented to person, place, and time. He appears well-developed and well-nourished. No distress.  Well app  HENT:  Head: Normocephalic and atraumatic.  Right Ear: External ear normal.  Left Ear: External ear normal.  Nose: Nose normal.  Mouth/Throat: Oropharynx is clear and moist. No oropharyngeal exudate.  No sinus tenderness No temporal tenderness  No TMJ tenderness  Eyes: Pupils are equal, round, and reactive to light. Conjunctivae and EOM are normal. Right eye exhibits no discharge. Left eye exhibits no discharge. No scleral icterus.  No nystagmus  Neck: Normal range of motion and full passive range of motion without pain. Neck supple. No JVD present. Carotid bruit is not present. No tracheal deviation present. No thyromegaly present.  Cardiovascular: Normal rate, regular rhythm, normal heart sounds and intact distal pulses. Exam reveals no gallop.  No murmur heard. Pulmonary/Chest: Effort normal and breath sounds normal. No respiratory distress. He has no wheezes. He has no rales.  No crackles  Diffusely distant bs  No wheeze   Abdominal: Soft. Bowel sounds are normal. He exhibits no distension, no abdominal bruit and no mass. There is no tenderness.    Musculoskeletal: He exhibits no edema or tenderness.  Lymphadenopathy:    He has no cervical adenopathy.  Neurological: He is alert and oriented to person, place, and time. He has normal strength and normal reflexes. He displays no atrophy, no tremor and normal reflexes. No cranial nerve deficit or sensory deficit. He exhibits normal muscle tone. He displays a negative Romberg sign. Coordination and gait normal.  No focal cerebellar signs   Skin: Skin is warm and dry. No rash noted. No pallor.  Psychiatric: He has a normal mood and affect. His behavior is normal. Thought content normal.          Assessment & Plan:   Problem List Items Addressed This Visit      Cardiovascular and Mediastinum   Essential hypertension    bp in fair control at this time  BP Readings from Last 1 Encounters:  12/06/17 136/84   No changes needed-improved with amlodipine and metoprolol xl 50 with pulse in 70s Most recent labs reviewed  Disc lifstyle change with low sodium diet and exercise          Other   Chronic daily headache - Primary    Rev lifestyle change for daily HA/migraine  Inc fluids Dec etoh and caffeine Regular sleep habits Smoking cessation  Trial of prophylaxis with amitriptyline - start at 10 and titrate to 30 mg at bedtime (he has had this in the past for different health problem and tolerated it)  Discussed expectations of this medication including time to effectiveness and mechanism of action, also poss of side effects (early and late)- including mental fuzziness, weight or appetite change, nausea and poss of worse dep or anxiety (even suicidal thoughts)  Pt voiced understanding and will stop med and update if this occurs   Reassuring exam  F/u planned -will update earlier if worse or no imp       Relevant Medications   amitriptyline (ELAVIL) 10 MG tablet   Headache (Chronic)    Not improved much with bp control See a/p for chronic daily ha       Relevant Medications  amitriptyline (ELAVIL) 10 MG tablet   TOBACCO USE    Pt plans to start chantix when able (does not want to mix with another new medication) to quit        Other Visit Diagnoses    Need for influenza vaccination       Relevant Orders   Flu Vaccine QUAD 6+ mos PF IM (Fluarix Quad PF) (Completed)

## 2017-12-06 NOTE — Patient Instructions (Addendum)
Try to cut back to 2 drinks per day or less  If you can - make your soda decaf  Water- aim for 64 oz per day   Get your eyes examined    Start amitriptyline 10 mg pill 1 at bedtime for a week Then increase to 2 at bedtime for a week  Then if tolerating well increase to 3 pills at bedtime  Alert me if intolerable side effects or problems   Blood pressure is improved   Keep working on quitting smoking

## 2017-12-08 NOTE — Assessment & Plan Note (Signed)
Rev lifestyle change for daily HA/migraine  Inc fluids Dec etoh and caffeine Regular sleep habits Smoking cessation  Trial of prophylaxis with amitriptyline - start at 10 and titrate to 30 mg at bedtime (he has had this in the past for different health problem and tolerated it)  Discussed expectations of this medication including time to effectiveness and mechanism of action, also poss of side effects (early and late)- including mental fuzziness, weight or appetite change, nausea and poss of worse dep or anxiety (even suicidal thoughts)  Pt voiced understanding and will stop med and update if this occurs   Reassuring exam  F/u planned -will update earlier if worse or no imp

## 2017-12-08 NOTE — Assessment & Plan Note (Signed)
Not improved much with bp control See a/p for chronic daily ha

## 2017-12-08 NOTE — Assessment & Plan Note (Signed)
bp in fair control at this time  BP Readings from Last 1 Encounters:  12/06/17 136/84   No changes needed-improved with amlodipine and metoprolol xl 50 with pulse in 70s Most recent labs reviewed  Disc lifstyle change with low sodium diet and exercise

## 2017-12-08 NOTE — Assessment & Plan Note (Signed)
Pt plans to start chantix when able (does not want to mix with another new medication) to quit

## 2017-12-10 ENCOUNTER — Telehealth: Payer: Self-pay | Admitting: *Deleted

## 2017-12-10 NOTE — Telephone Encounter (Signed)
Go up to 2 pills of the amitriptyline tonight  After 2-3 days if ok with it go up to 3 pills  This may take a while to work but if headache continues to worsen instead of improve we will try something else   He could be coming down with a cold (that would make headache worse as well as allergies   If not currently taking- antihistamine otc (claritin/allegra/zyrtec) may help as well as an otc steroid nasal spray

## 2017-12-10 NOTE — Telephone Encounter (Signed)
Is he still on the 10 mg or did he try to increase it ?  Any new symptoms ?

## 2017-12-10 NOTE — Telephone Encounter (Signed)
Pt notified of Dr. Marliss Coots instructions and verbalized understanding. He is already taking zyrtec and flonase but will keep using them but will update [place] if sxs worsen or if HA doesn't start to improve

## 2017-12-10 NOTE — Telephone Encounter (Signed)
Pt said he is still on the 10mg  once daily, he hasn't reached the point where you advised him to increase it to 2 pills yet. Pt said that his allergies were acting up over the weekend and now he has a dry cough, pt said that he knows that him coughing is making his HAs worse. Pt said the cough is dry and he isn't coughing up and phlegm at all. No fever or any other sxs besides the cough and the constant HA

## 2017-12-10 NOTE — Telephone Encounter (Signed)
Copied from [place] [id number]. Topic: Quick Communication - See Telephone Encounter >> Dec 10, 2017 12:28 PM Antonieta Iba C wrote: CRM for notification. See Telephone encounter for: 12/10/17.  Pt was seen last week. Pt says that he was prescribed amitriptyline (ELAVIL) 10 MG tablet for headaches, medication is not helping. Pt says that his headaches were on and off but now they are consistant. Pt would like to be advised further on what should he do?  CB: [phone]

## 2017-12-12 ENCOUNTER — Telehealth: Payer: Self-pay | Admitting: *Deleted

## 2017-12-12 NOTE — Telephone Encounter (Signed)
Please let me know what days he missed please and I will get that done.   Has he had any improvement?

## 2017-12-12 NOTE — Telephone Encounter (Signed)
Copied from [place] [phone]. Topic: Inquiry >> Dec 12, 2017  2:13 PM Sheran Luz wrote: Reason for CRM: Pt called wanting to know if he could get a letter for his work, stating that he has missed 1w due to headaches being treated by Dr. Glori Bickers.

## 2017-12-13 NOTE — Telephone Encounter (Signed)
Pt notified note ready for pick up and advise of Dr. Marliss Coots instructions and pt will update [place] next week

## 2017-12-13 NOTE — Telephone Encounter (Signed)
Called pt and no answer and no VM. CRM created

## 2017-12-13 NOTE — Telephone Encounter (Signed)
Letter done and in IN box  Please let us know how you are doing next week- hopefully will start to improve more with the medication and we can advance it further if needed

## 2017-12-13 NOTE — Telephone Encounter (Signed)
Copied from [place] [id number]. Topic: Quick Communication - See Telephone Encounter >> Dec 13, 2017  9:50 AM Tammi Sou, CMA wrote: CRM for notification. See Telephone encounter from yesterday regarding pt needing a work note. Per Dr. Glori Bickers we need the exact days pt missed. Please get that info and document it on the phone note >> Dec 13, 2017 10:33 AM Burchel, Abbi R wrote: Pt states he was out the entire week (Mon-Fri) this week.  Pt states he is a bit better today,  But it still not headache free.   Please call pt when work note is readt for pick up.

## 2017-12-28 ENCOUNTER — Other Ambulatory Visit: Payer: Self-pay | Admitting: Family Medicine

## 2017-12-29 ENCOUNTER — Other Ambulatory Visit: Payer: Self-pay | Admitting: Family Medicine

## 2017-12-29 DIAGNOSIS — J324 Chronic pansinusitis: Secondary | ICD-10-CM

## 2018-01-13 ENCOUNTER — Encounter: Payer: Self-pay | Admitting: Family Medicine

## 2018-01-13 ENCOUNTER — Ambulatory Visit (INDEPENDENT_AMBULATORY_CARE_PROVIDER_SITE_OTHER): Payer: 59 | Admitting: Family Medicine

## 2018-01-13 ENCOUNTER — Ambulatory Visit: Payer: Self-pay | Admitting: *Deleted

## 2018-01-13 VITALS — BP 124/82 | HR 89 | Temp 98.3°F | Ht 72.0 in | Wt 187.2 lb

## 2018-01-13 DIAGNOSIS — I10 Essential (primary) hypertension: Secondary | ICD-10-CM | POA: Diagnosis not present

## 2018-01-13 DIAGNOSIS — F17200 Nicotine dependence, unspecified, uncomplicated: Secondary | ICD-10-CM | POA: Diagnosis not present

## 2018-01-13 DIAGNOSIS — R519 Headache, unspecified: Secondary | ICD-10-CM

## 2018-01-13 DIAGNOSIS — R51 Headache: Secondary | ICD-10-CM

## 2018-01-13 MED ORDER — PREDNISONE 10 MG PO TABS: ORAL_TABLET | ORAL | 0 refills | Status: DC

## 2018-01-13 NOTE — Telephone Encounter (Signed)
I will see him then

## 2018-01-13 NOTE — Telephone Encounter (Signed)
Pt reports headaches x 6 months. Seen by Dr. Glori Bickers 12/06/17 for similar symptoms. States followed all instructions by Dr. Glori Bickers; eye exam WNL, BP 120's / 80's, and has been taking amitriptyline for over 1 month. States headaches "the same." Left sided "From left eye half way back my head."  Reports light sensitivity.  States headaches are more constant now, mostly  intermittent at onset. Reports occasional nausea, lightheadedness with headaches. Presently 5-6/10 after taking Excedrin earlier. Pt requesting appt today due to work schedule. Appt made with Dr. Glori Bickers for 1115 today. Care advise given per protocol. Reason for Disposition . Headache is a chronic symptom (recurrent or ongoing AND present > 4 weeks)  Answer Assessment - Initial Assessment Questions 1. LOCATION: "Where does it hurt?"     Left sided 2. ONSET: "When did the headache start?" (Minutes, hours or days)      6 months ago 3. PATTERN: "Does the pain come and go, or has it been constant since it started?"     VAries 4. SEVERITY: "How bad is the pain?" and "What does it keep you from doing?"  (e.g., Scale 1-10; mild, moderate, or severe)   - MILD (1-3): doesn't interfere with normal activities    - MODERATE (4-7): interferes with normal activities or awakens from sleep    - SEVERE (8-10): excruciating pain, unable to do any normal activities        5-6/10 after taking excedrin 5. RECURRENT SYMPTOM: "Have you ever had headaches before?" If so, ask: "When was the last time?" and "What happened that time?"      Yes 6. CAUSE: "What do you think is causing the headache?"     unsure 7. MIGRAINE: "Have you been diagnosed with migraine headaches?" If so, ask: "Is this headache similar?"      no 8. HEAD INJURY: "Has there been any recent injury to the head?"      no 9. OTHER SYMPTOMS: "Do you have any other symptoms?" (fever, stiff neck, eye pain, sore throat, cold symptoms)     No, nausea with headaches, lightheadedness at times, light  sensitivity  Protocols used: HEADACHE-A-AH

## 2018-01-13 NOTE — Assessment & Plan Note (Signed)
Disc in detail risks of smoking and possible outcomes including copd, vascular/ heart disease, cancer , respiratory and sinus infections  Pt voices understanding Started first dose of chantix today-encouraged

## 2018-01-13 NOTE — Progress Notes (Signed)
Subjective:    Patient ID: Antonio Russell, male    DOB: [birth year], 57 y.o.   MRN: [id number]  HPI Here for f/u of chronic daily headache   Still left sided  Varies from throbbing to constant   (feels like a pulling sensation also)  Drinking 2 (16 oz) bottles of water per day  On amitriptyline 30 mg (sleeps well) - better sleep   No longer as severe as it was when he was out of work  Still chronic  Any kind of straining makes it worse   Had eyes checked  Needs glasses     Smoking- same (started the first pill of the chantix this am)  Alcohol intake - no change 3-4 per day  (not willing to cut back)  Caffeine-none at all    Patient Active Problem List   Diagnosis Date Noted  . Chronic daily headache 12/06/2017  . Hypercalcemia 11/08/2017  . Headache 11/04/2017  . External hemorrhoid 11/05/2016  . Alcohol use 11/04/2016  . Elevated glucose level 11/02/2016  . Skin lesion of left leg 11/02/2016  . Routine general medical examination at a health care facility 10/25/2016  . Prostate cancer screening 10/25/2016  . Leukocytosis 11/30/2014  . Special screening for malignant neoplasms, colon 02/16/2011  . Arthritis   . Essential hypertension 07/13/2010  . CAD (coronary artery disease)   . Testosterone deficiency 05/25/2009  . Hyperlipidemia 05/25/2009  . 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 RENAL CALCULUS, HX OF 05/11/2009  . TOBACCO USE 09/12/2007  . LOSS, HEARING NOS 10/10/2006   Past Medical History:  Diagnosis Date  . Allergic rhinitis   . Allergy   . Anxiety   . Arthritis   . Back pain   . Chest pain 2012   cardiac cath - nonobstructive, nl LV fxn, rec aggressive med management  . GERD (gastroesophageal reflux disease)   . Headache(784.0)   . HLD (hyperlipidemia)   . Impotence of organic origin   . Other testicular hypofunction   . Personal history of urinary calculi   . Sleep apnea   . Tobacco use disorder   . Unspecified hearing loss    Past  Surgical History:  Procedure Laterality Date  . COLONOSCOPY    . HEMORRHOID SURGERY    . KNEE SURGERY Right 2013  . NASAL SEPTUM SURGERY     Social History   Tobacco Use  . Smoking status: Current Every Day Smoker    Packs/day: 1.00    Years: 35.00    Pack years: 35.00    Types: Cigarettes  . Smokeless tobacco: Former Systems developer  . Tobacco comment: just recently decreased from 1 PPD-purchased electronic cigarette  Substance Use Topics  . Alcohol use: Yes    Alcohol/week: 0.0 standard drinks    Comment: 6 pack 3 times weekly  . Drug use: No   Family History  Problem Relation Age of Onset  . Hypertension Mother   . Hyperlipidemia Mother   . Diabetes Mother   . Hypertension Father   . Hyperlipidemia Father   . Cancer Father        throat  . Hypertension Sister   . Lung cancer Unknown        GF  . Heart disease Unknown        GM  . Esophageal cancer Paternal Uncle   . Colon cancer Neg Hx   . Stomach cancer Neg Hx   . Rectal cancer Neg Hx    Allergies  Allergen  Reactions  . Crestor [Rosuvastatin Calcium] Other (See Comments)    Muscle aches  . Lipitor [Atorvastatin Calcium] Other (See Comments)    Arm soreness   . Sertraline Hcl Other (See Comments)    REACTION: muscle twitches  . Zocor [Simvastatin - High Dose] Other (See Comments)    Muscle pain    Current Outpatient Medications on File Prior to Visit  Medication Sig Dispense Refill  . amitriptyline (ELAVIL) 10 MG tablet Take 3 tablets (30 mg total) by mouth at bedtime. 90 tablet 5  . amLODipine (NORVASC) 5 MG tablet Take 1 tablet (5 mg total) by mouth daily. 30 tablet 6  . cetirizine (ZYRTEC) 10 MG tablet Take 10 mg by mouth as needed for allergies.    . cyclobenzaprine (FLEXERIL) 5 MG tablet Take 1-2 tablets (5-10 mg total) by mouth 2 (two) times daily as needed (headache). 30 tablet 0  . fluticasone (FLONASE) 50 MCG/ACT nasal spray SPRAY 2 SPRAYS INTO EACH NOSTRIL EVERY DAY 16 g 5  . hydrocortisone (ANUSOL-HC)  2.5 % rectal cream Place 1 application rectally 3 (three) times daily. To affected area 30 g 1  . metoprolol succinate (TOPROL-XL) 50 MG 24 hr tablet Take 1 tablet (50 mg total) by mouth daily. Take with or immediately following a meal. 30 tablet 11  . testosterone cypionate (DEPOTESTOTERONE CYPIONATE) 100 MG/ML injection Inject into the muscle every 28 (twenty-eight) days. For IM use only    . varenicline (CHANTIX PAK) 0.5 MG X 11 & 1 MG X 42 tablet Take one 0.5 mg tablet by mouth once daily for 3 days, then increase to one 0.5 mg tablet twice daily for 4 days, then increase to one 1 mg tablet twice daily. 53 tablet 0  . varenicline (CHANTIX) 1 MG tablet Take 1 tablet (1 mg total) by mouth 2 (two) times daily. After finishing the starter pack 60 tablet 3   No current facility-administered medications on file prior to visit.     Review of Systems  Constitutional: Negative for activity change, appetite change, fatigue, fever and unexpected weight change.  HENT: Negative for congestion, rhinorrhea, sore throat and trouble swallowing.   Eyes: Negative for pain, redness, itching and visual disturbance.  Respiratory: Negative for cough, chest tightness, shortness of breath and wheezing.   Cardiovascular: Negative for chest pain and palpitations.  Gastrointestinal: Positive for nausea. Negative for abdominal pain, blood in stool, constipation and diarrhea.       Occ nausea Not often   Endocrine: Negative for cold intolerance, heat intolerance, polydipsia and polyuria.  Genitourinary: Negative for difficulty urinating, dysuria, frequency and urgency.  Musculoskeletal: Negative for arthralgias, joint swelling and myalgias.  Skin: Negative for pallor and rash.  Neurological: Positive for headaches. Negative for dizziness, tremors, seizures, syncope, facial asymmetry, speech difficulty, weakness, light-headedness and numbness.       Not dizzy like he was originally  Hematological: Negative for  adenopathy. Does not bruise/bleed easily.  Psychiatric/Behavioral: Negative for decreased concentration and dysphoric mood. The patient is not nervous/anxious.        Objective:   Physical Exam  Constitutional: He is oriented to person, place, and time. He appears well-developed and well-nourished. No distress.  Well appearing   HENT:  Head: Normocephalic and atraumatic.  Right Ear: External ear normal.  Left Ear: External ear normal.  Nose: Nose normal.  Mouth/Throat: Oropharynx is clear and moist. No oropharyngeal exudate.  No sinus tenderness No temporal tenderness  No TMJ tenderness  Eyes: Pupils are  equal, round, and reactive to light. Conjunctivae and EOM are normal. Right eye exhibits no discharge. Left eye exhibits no discharge. No scleral icterus.  No nystagmus  Neck: Normal range of motion and full passive range of motion without pain. Neck supple. No JVD present. Carotid bruit is not present. No tracheal deviation present. No thyromegaly present.  Cardiovascular: Normal rate, regular rhythm and normal heart sounds.  No murmur heard. Pulmonary/Chest: Effort normal and breath sounds normal. No respiratory distress. He has no wheezes. He has no rales.  Diffusely distant bs Over all good air exch   Abdominal: Soft. Bowel sounds are normal. He exhibits no distension and no mass. There is no tenderness.  Musculoskeletal: He exhibits no edema or tenderness.  Lymphadenopathy:    He has no cervical adenopathy.  Neurological: He is alert and oriented to person, place, and time. He has normal strength and normal reflexes. He displays no atrophy, no tremor and normal reflexes. No cranial nerve deficit or sensory deficit. He exhibits normal muscle tone. He displays a negative Romberg sign. Coordination and gait normal.  No focal cerebellar signs   Skin: Skin is warm and dry. No rash noted. No pallor.  Psychiatric: He has a normal mood and affect. His behavior is normal. Thought content  normal.          Assessment & Plan:   Problem List Items Addressed This Visit      Cardiovascular and Mediastinum   Essential hypertension    bp in fair control at this time  BP Readings from Last 1 Encounters:  01/13/18 124/82   No changes needed Most recent labs reviewed  Disc lifstyle change with low sodium diet and exercise          Other   Chronic daily headache - Primary    Slightly less severe but otherwise fails to improve with amitriptyline  (he is sleeping better however) Disc lifestyle (again urged smoking cessation and limiting etoh intake) Prednisone taper for headache to break cycle (disc side eff)  Ref to neurology as well  Alert if symptoms suddenly worse Re assuring exam      Relevant Orders   Ambulatory referral to Neurology   TOBACCO USE    Disc in detail risks of smoking and possible outcomes including copd, vascular/ heart disease, cancer , respiratory and sinus infections  Pt voices understanding Started first dose of chantix today-encouraged

## 2018-01-13 NOTE — Assessment & Plan Note (Signed)
Slightly less severe but otherwise fails to improve with amitriptyline  (he is sleeping better however) Disc lifestyle (again urged smoking cessation and limiting etoh intake) Prednisone taper for headache to break cycle (disc side eff)  Ref to neurology as well  Alert if symptoms suddenly worse Re assuring exam

## 2018-01-13 NOTE — Assessment & Plan Note (Signed)
bp in fair control at this time  BP Readings from Last 1 Encounters:  01/13/18 124/82   No changes needed Most recent labs reviewed  Disc lifstyle change with low sodium diet and exercise

## 2018-01-13 NOTE — Patient Instructions (Addendum)
Aim for 64 oz of water per day if you can   Continue the amitriptyline  I'm glad you are sleeping well  Please consider cutting back alcohol   Take the prednisone taper as directed  We will refer you to neurology and go from there   If symptoms suddenly worsen let me know

## 2018-02-07 ENCOUNTER — Ambulatory Visit: Payer: 59 | Admitting: Family Medicine

## 2018-03-03 ENCOUNTER — Other Ambulatory Visit: Payer: Self-pay | Admitting: Specialist

## 2018-03-03 DIAGNOSIS — G4452 New daily persistent headache (NDPH): Secondary | ICD-10-CM

## 2018-03-12 ENCOUNTER — Ambulatory Visit
Admission: RE | Admit: 2018-03-12 | Discharge: 2018-03-12 | Disposition: A | Discharge status: Home / Self Care | Payer: 59 | Source: Ambulatory Visit | Attending: Specialist | Admitting: Specialist

## 2018-03-12 DIAGNOSIS — G4452 New daily persistent headache (NDPH): Secondary | ICD-10-CM

## 2018-03-12 MED ORDER — GADOBENATE DIMEGLUMINE 529 MG/ML IV SOLN
19.0000 mL | Freq: Once | INTRAVENOUS | Status: AC | PRN
  Administered 2018-03-12: 19 mL via INTRAVENOUS

## 2018-04-29 ENCOUNTER — Telehealth: Payer: Self-pay | Admitting: Family Medicine

## 2018-04-29 MED ORDER — VARENICLINE TARTRATE 1 MG PO TABS: 1.0000 mg | ORAL_TABLET | Freq: Two times a day (BID) | ORAL | 3 refills | Status: DC

## 2018-04-29 NOTE — Telephone Encounter (Signed)
I sent it  

## 2018-04-29 NOTE — Telephone Encounter (Signed)
Pt is requesting refill on chantix. He is out for 3 weeks and said he is having the urge to smoke now and he would like a refill. [facility]

## 2018-05-02 ENCOUNTER — Telehealth: Payer: Self-pay | Admitting: Family Medicine

## 2018-05-02 DIAGNOSIS — Z20828 Contact with and (suspected) exposure to other viral communicable diseases: Secondary | ICD-10-CM

## 2018-05-02 MED ORDER — OSELTAMIVIR PHOSPHATE 75 MG PO CAPS: 75.0000 mg | ORAL_CAPSULE | Freq: Two times a day (BID) | ORAL | 0 refills | Status: AC

## 2018-05-02 NOTE — Telephone Encounter (Signed)
Patient's wife was diagnosed with Flu A on 04/29/2018 when she saw Dr. Einar Pheasant.

## 2018-05-02 NOTE — Telephone Encounter (Signed)
Noted, prescription for Tamiflu sent to pharmacy. Take 1 capsule by mouth twice daily for 5 days.

## 2018-05-02 NOTE — Telephone Encounter (Addendum)
Does he have any symptoms? Cough, fevers, chills, body aches?

## 2018-05-02 NOTE — Telephone Encounter (Signed)
Patient advised.

## 2018-05-02 NOTE — Telephone Encounter (Signed)
Pt 's wife had the flu and now pt want a prescription for Tamaflu. Please call pt to advise   [facility]

## 2018-05-02 NOTE — Telephone Encounter (Signed)
Spoke with patient and he states he is having symptoms which started yesterday-sneezing, sinus pressure, ear pressure, nasal congestion, body aches, chills. Did not check his temperature so far.

## 2018-05-19 ENCOUNTER — Other Ambulatory Visit: Payer: Self-pay | Admitting: Family Medicine

## 2018-05-30 ENCOUNTER — Other Ambulatory Visit: Payer: Self-pay | Admitting: Family Medicine

## 2018-06-13 ENCOUNTER — Other Ambulatory Visit: Payer: Self-pay | Admitting: Family Medicine

## 2018-06-16 ENCOUNTER — Other Ambulatory Visit: Payer: Self-pay | Admitting: Family Medicine

## 2018-06-16 MED ORDER — AMITRIPTYLINE HCL 10 MG PO TABS: 30.0000 mg | ORAL_TABLET | Freq: Every day | ORAL | 1 refills | Status: DC

## 2018-06-16 NOTE — Telephone Encounter (Signed)
Pharmacy is

## 2018-06-16 NOTE — Addendum Note (Signed)
Addended by: Tammi Sou on: 06/16/2018 11:36 AM   Modules accepted: Orders

## 2018-06-27 ENCOUNTER — Other Ambulatory Visit: Payer: Self-pay | Admitting: Family Medicine

## 2018-06-27 NOTE — Telephone Encounter (Signed)
I sent it to his pharmacy  Take 1 pill daily for 5 days and then bid  (directions say bid but tell him daily for first 5 d)  If side effects or mood change let us know

## 2018-06-27 NOTE — Telephone Encounter (Signed)
There was a note on this refill saying pt's insurance will not paiy for the chantix anymore and requested you send in an alt med

## 2018-06-30 NOTE — Telephone Encounter (Signed)
Left VM with directions.

## 2018-12-01 ENCOUNTER — Ambulatory Visit (INDEPENDENT_AMBULATORY_CARE_PROVIDER_SITE_OTHER): Payer: 59 | Admitting: Family Medicine

## 2018-12-01 ENCOUNTER — Encounter: Payer: Self-pay | Admitting: Family Medicine

## 2018-12-01 ENCOUNTER — Other Ambulatory Visit: Payer: Self-pay

## 2018-12-01 ENCOUNTER — Ambulatory Visit (INDEPENDENT_AMBULATORY_CARE_PROVIDER_SITE_OTHER)
Admission: RE | Admit: 2018-12-01 | Discharge: 2018-12-01 | Disposition: A | Discharge status: Home / Self Care | Payer: 59 | Source: Ambulatory Visit | Attending: Family Medicine | Admitting: Family Medicine

## 2018-12-01 VITALS — BP 142/94 | HR 81 | Temp 98.2°F | Ht 72.0 in | Wt 191.4 lb

## 2018-12-01 DIAGNOSIS — M7712 Lateral epicondylitis, left elbow: Secondary | ICD-10-CM | POA: Diagnosis not present

## 2018-12-01 DIAGNOSIS — I10 Essential (primary) hypertension: Secondary | ICD-10-CM | POA: Diagnosis not present

## 2018-12-01 DIAGNOSIS — M25562 Pain in left knee: Secondary | ICD-10-CM | POA: Diagnosis not present

## 2018-12-01 DIAGNOSIS — M79672 Pain in left foot: Secondary | ICD-10-CM

## 2018-12-01 DIAGNOSIS — M7710 Lateral epicondylitis, unspecified elbow: Secondary | ICD-10-CM | POA: Insufficient documentation

## 2018-12-01 DIAGNOSIS — F17200 Nicotine dependence, unspecified, uncomplicated: Secondary | ICD-10-CM

## 2018-12-01 DIAGNOSIS — Z23 Encounter for immunization: Secondary | ICD-10-CM

## 2018-12-01 MED ORDER — MELOXICAM 15 MG PO TABS: 15.0000 mg | ORAL_TABLET | Freq: Every day | ORAL | 0 refills | Status: DC | PRN

## 2018-12-01 MED ORDER — VARENICLINE TARTRATE 1 MG PO TABS: 1.0000 mg | ORAL_TABLET | Freq: Two times a day (BID) | ORAL | 3 refills | Status: DC

## 2018-12-01 MED ORDER — METOPROLOL SUCCINATE ER 100 MG PO TB24: 100.0000 mg | ORAL_TABLET | Freq: Every day | ORAL | 11 refills | Status: DC

## 2018-12-01 MED ORDER — VARENICLINE TARTRATE 0.5 MG X 11 & 1 MG X 42 PO MISC: ORAL | 0 refills | Status: DC

## 2018-12-01 NOTE — Assessment & Plan Note (Signed)
Disc in detail risks of smoking and possible outcomes including copd, vascular/ heart disease, cancer , respiratory and sinus infections  Pt voices understanding Px chantix  This has helped him quit in the past

## 2018-12-01 NOTE — Patient Instructions (Addendum)
I think you have lateral epicondylitis  Use ice  Get a forearm band - may help pain  Take meloxicam daily with food as needed   Let's xray the knee and the foot today  We will call you with a result  meloxicam will help these also   Lets increase your metoprolol xl to 100 mg daily for blood pressure If you can check BP when relaxed -do so  Follow up in about a month   Flu shot today   I will send in chantix

## 2018-12-01 NOTE — Assessment & Plan Note (Addendum)
Lateral pain  Job is physical and requires squatting  Works a lot of hours  Xray today- r/o OA /other injury  Ice/elevate when able  Compression wrap or brace may help  meloxicam px  Consider labs next time- fam hx of autoimmune joint dz  No rash or redness or swelling of joints-re assuring

## 2018-12-01 NOTE — Assessment & Plan Note (Signed)
Recommend ice (and relative rest if able from reped movement) Forearm band -can buy otc  meloxicam 15 mg daily with food  Update if not improved in 1-2 wk

## 2018-12-01 NOTE — Progress Notes (Signed)
Subjective:    Patient ID: Antonio Russell, male    DOB: 27-Feb-1961, 58 y.o.   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 If auto immune dz  He does not have psoriasis  No h/o recent rashes   No fever lately  Has temp checks at work every day   He has h/o lumbar deg disc dz Back is better than it used to be      Smoker-wants to try chantix today   Blood pressure is high BP Readings from Last 3 Encounters:  12/01/18 (!) 142/94  01/13/18 124/82  12/06/17 136/84    Taking metoprolol  Stopped amlodipine due to sexual side effects   Pulse Readings from Last 3 Encounters:  12/01/18 81  01/13/18 89  12/06/17 71    Patient Active Problem List   Diagnosis Date Noted  . Lateral epicondylitis 12/01/2018  . Left knee pain 12/01/2018  . Left foot pain 12/01/2018  . Chronic daily headache 12/06/2017  . Hypercalcemia 11/08/2017  . Headache 11/04/2017  . External hemorrhoid  11/05/2016  . Alcohol use 11/04/2016  . Elevated glucose level 11/02/2016  . Skin lesion of left leg 11/02/2016  . Routine general medical examination at a health care facility 10/25/2016  . Prostate cancer screening 10/25/2016  . Leukocytosis 11/30/2014  . Special screening for malignant neoplasms, colon 02/16/2011  . Arthritis   . Essential hypertension 07/13/2010  . CAD (coronary artery disease)   . Testosterone deficiency 05/25/2009  . Hyperlipidemia 05/25/2009  . 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 RENAL CALCULUS, HX OF 05/11/2009  . TOBACCO USE 09/12/2007  . LOSS, HEARING NOS 10/10/2006   Past Medical History:  Diagnosis Date  . Allergic rhinitis   . Allergy   . Anxiety   . Arthritis   . Back pain   . Chest pain 2012   cardiac cath - nonobstructive, nl LV fxn, rec aggressive med management  . GERD (gastroesophageal reflux disease)   . Headache(784.0)   . HLD (hyperlipidemia)   . Impotence of organic origin   . Other testicular hypofunction   . Personal history of urinary calculi   . Sleep apnea   . Tobacco use disorder   . Unspecified hearing loss  Past Surgical History:  Procedure Laterality Date  . COLONOSCOPY    . HEMORRHOID SURGERY    . KNEE SURGERY Right 2013  . NASAL SEPTUM SURGERY     Social History   Tobacco Use  . Smoking status: Current Every Day Smoker    Packs/day: 1.00    Years: 35.00    Pack years: 35.00    Types: Cigarettes  . Smokeless tobacco: Former Systems developer  . Tobacco comment: just recently decreased from 1 PPD-purchased electronic cigarette  Substance Use Topics  . Alcohol use: Yes    Alcohol/week: 0.0 standard drinks    Comment: 6 pack 3 times weekly  . Drug use: No   Family History  Problem Relation Age of Onset  . Hypertension Mother   . Hyperlipidemia Mother   . Diabetes Mother   . Hypertension Father   . Hyperlipidemia Father   . Cancer Father        throat  . Hypertension Sister   . Lung cancer Unknown         GF  . Heart disease Unknown        GM  . Esophageal cancer Paternal Uncle   . Colon cancer Neg Hx   . Stomach cancer Neg Hx   . Rectal cancer Neg Hx    Allergies  Allergen Reactions  . Crestor [Rosuvastatin Calcium] Other (See Comments)    Muscle aches  . Lipitor [Atorvastatin Calcium] Other (See Comments)    Arm soreness   . Sertraline Hcl Other (See Comments)    REACTION: muscle twitches  . Zocor [Simvastatin - High Dose] Other (See Comments)    Muscle pain   . Amlodipine     Sexual side effects   Current Outpatient Medications on File Prior to Visit  Medication Sig Dispense Refill  . fluticasone (FLONASE) 50 MCG/ACT nasal spray SPRAY 2 SPRAYS INTO EACH NOSTRIL EVERY DAY 16 g 5   No current facility-administered medications on file prior to visit.     Review of Systems  Constitutional: Negative for activity change, appetite change, fatigue, fever and unexpected weight change.  HENT: Negative for congestion, rhinorrhea, sore throat and trouble swallowing.   Eyes: Negative for pain, redness, itching and visual disturbance.  Respiratory: Negative for cough, chest tightness, shortness of breath and wheezing.   Cardiovascular: Negative for chest pain and palpitations.  Gastrointestinal: Negative for abdominal pain, blood in stool, constipation, diarrhea and nausea.  Endocrine: Negative for cold intolerance, heat intolerance, polydipsia and polyuria.  Genitourinary: Negative for difficulty urinating, dysuria, frequency and urgency.  Musculoskeletal: Positive for arthralgias and back pain. Negative for joint swelling and myalgias.  Skin: Negative for pallor and rash.  Neurological: Negative for dizziness, tremors, weakness, light-headedness, numbness and headaches.  Hematological: Negative for adenopathy. Does not bruise/bleed easily.  Psychiatric/Behavioral: Negative for decreased concentration and dysphoric mood. The patient is not nervous/anxious.        Objective:    Physical Exam Constitutional:      General: He is not in acute distress.    Appearance: Normal appearance. He is normal weight. He is not ill-appearing.  HENT:     Head: Normocephalic and atraumatic.  Eyes:     General: No scleral icterus.    Extraocular Movements: Extraocular movements intact.     Conjunctiva/sclera: Conjunctivae normal.     Pupils: Pupils are equal, round, and reactive to light.  Neck:     Musculoskeletal: Normal range of motion and neck supple. No neck rigidity.  Vascular: No carotid bruit.  Cardiovascular:     Rate and Rhythm: Normal rate and regular rhythm.     Pulses: Normal pulses.     Heart sounds: Normal heart sounds.  Pulmonary:     Effort: Pulmonary effort is normal. No respiratory distress.     Breath sounds: Normal breath sounds. No wheezing or rales.     Comments: bs are mildly distant Musculoskeletal:     Left elbow: He exhibits normal range of motion, no swelling, no effusion and no deformity. Tenderness found. Lateral epicondyle tenderness noted.     Left wrist: He exhibits normal range of motion, no tenderness, no bony tenderness, no swelling, no effusion, no crepitus and no deformity.     Left knee: He exhibits decreased range of motion. He exhibits no swelling, no effusion, no deformity, no erythema, no LCL laxity, normal patellar mobility, normal meniscus and no MCL laxity. Tenderness found. Lateral joint line tenderness noted.     Right lower leg: No edema.     Left lower leg: No edema.     Left foot: Normal range of motion. Tenderness present. No bony tenderness, swelling, crepitus or deformity.     Comments: L foot-tender at base of 1-3rd metatarsals  No swelling/redness/warmth   L knee- lateral joint line tenderness No effusion  Pain on full flexion and some with bounce/hypertextension  No redness or swelling  L elbow-tender lateral epicondyle  Worse with pronation  Nl grip  Neg tinel/phalen for L wrist   Lymphadenopathy:      Cervical: No cervical adenopathy.  Skin:    General: Skin is warm and dry.     Coloration: Skin is not pale.     Findings: No erythema or rash.  Neurological:     Mental Status: He is alert.     Motor: No weakness.     Coordination: Coordination normal.     Gait: Gait normal.     Deep Tendon Reflexes: Reflexes normal.  Psychiatric:        Mood and Affect: Mood normal.           Assessment & Plan:   Problem List Items Addressed This Visit      Cardiovascular and Mediastinum   Essential hypertension    Pt stopped amlodipine due to sexual side effects bp is high  Will inc metoprolol xl from 50 to 100 mg (he tolerates this well) watching pulse and bp He is also trying to quit smoking  F/u planned 1 mo       Relevant Medications   metoprolol succinate (TOPROL-XL) 100 MG 24 hr tablet     Musculoskeletal and Integument   Lateral epicondylitis    Recommend ice (and relative rest if able from reped movement) Forearm band -can buy otc  meloxicam 15 mg daily with food  Update if not improved in 1-2 wk      Relevant Medications   meloxicam (MOBIC) 15 MG tablet     Other   TOBACCO USE    Disc in detail risks of smoking and possible outcomes including copd, vascular/ heart disease, cancer , respiratory and sinus infections  Pt voices understanding Px chantix  This has helped him quit in the past      Left knee pain - Primary    Lateral pain  Job is physical and requires squatting  Works a lot of hours  Xray today- r/o OA /other injury  Ice/elevate when able  Compression wrap or brace may help  meloxicam px  Consider labs next time- fam hx of autoimmune joint dz  No rash or redness or swelling of joints-re assuring       Relevant Orders   DG Knee 4 Views W/Patella Left   Left foot pain    Xray today to r/o stress fx Metatarsalgia in the differential Has switched to better work shoes-this may help No s/s of autoimmune joint dz but if no imp consider labs at  f/u  meloxicam 15 mg px for qd prn      Relevant Orders   DG Foot Complete Left    Other Visit Diagnoses    Need for influenza vaccination       Relevant Orders   Flu Vaccine QUAD 6+ mos PF IM (Fluarix Quad PF) (Completed)

## 2018-12-01 NOTE — Assessment & Plan Note (Signed)
Xray today to r/o stress fx Metatarsalgia in the differential Has switched to better work shoes-this may help No s/s of autoimmune joint dz but if no imp consider labs at f/u  meloxicam 15 mg px for qd prn

## 2018-12-01 NOTE — Assessment & Plan Note (Signed)
Pt stopped amlodipine due to sexual side effects bp is high  Will inc metoprolol xl from 50 to 100 mg (he tolerates this well) watching pulse and bp He is also trying to quit smoking  F/u planned 1 mo

## 2018-12-24 ENCOUNTER — Other Ambulatory Visit: Payer: Self-pay | Admitting: Family Medicine

## 2018-12-25 ENCOUNTER — Other Ambulatory Visit: Payer: Self-pay | Admitting: Family Medicine

## 2019-01-05 ENCOUNTER — Telehealth: Payer: Self-pay | Admitting: Family Medicine

## 2019-01-05 NOTE — Telephone Encounter (Signed)
[facility] and [facility] said his insurance doesn't give them an option to do a PA it just isn't covered at all by his insurance. They question if you wanted to send something else in but they can't tell us if it would be covered until they received the alt med and run it through his insurance

## 2019-01-05 NOTE — Telephone Encounter (Signed)
Left message for patient to call back  

## 2019-01-05 NOTE — Telephone Encounter (Signed)
wellbutrin is an option if covered- does he know if he has tried it?  Also nicotine patches  Let me know if he is open to either

## 2019-01-05 NOTE — Telephone Encounter (Signed)
I don't know if he needs a prior auth?   (many people do have to buy out of pocket anyway because insurance does not cover smoking cessation medication)

## 2019-01-05 NOTE — Telephone Encounter (Signed)
Patient stated he spoke with the pharmacy about his Chantix prescription. They advised the patient that his insurance will not cover this medication and he needed to contact our office  Patient would like to know what he should do.   C/B # [phone]

## 2019-01-06 NOTE — Telephone Encounter (Signed)
Patient returned call.  Shapale was checking in a patient and patient said he was at work, so she can't call him back.  I let patient know Dr.Tower's suggestions.  Patient said Wellbutrin doesn't work and nicotine patches make his heart race.  Patient said he'll call his insurance and find out what they cover.

## 2019-01-13 ENCOUNTER — Encounter: Payer: Self-pay | Admitting: Family Medicine

## 2019-01-13 ENCOUNTER — Other Ambulatory Visit: Payer: Self-pay

## 2019-01-13 ENCOUNTER — Ambulatory Visit (INDEPENDENT_AMBULATORY_CARE_PROVIDER_SITE_OTHER): Payer: 59 | Admitting: Family Medicine

## 2019-01-13 VITALS — BP 136/82 | HR 71 | Temp 97.7°F | Ht 72.0 in | Wt 192.1 lb

## 2019-01-13 DIAGNOSIS — E7800 Pure hypercholesterolemia, unspecified: Secondary | ICD-10-CM

## 2019-01-13 DIAGNOSIS — D72829 Elevated white blood cell count, unspecified: Secondary | ICD-10-CM

## 2019-01-13 DIAGNOSIS — F17200 Nicotine dependence, unspecified, uncomplicated: Secondary | ICD-10-CM

## 2019-01-13 DIAGNOSIS — R7309 Other abnormal glucose: Secondary | ICD-10-CM | POA: Diagnosis not present

## 2019-01-13 DIAGNOSIS — I10 Essential (primary) hypertension: Secondary | ICD-10-CM | POA: Diagnosis not present

## 2019-01-13 MED ORDER — MELOXICAM 15 MG PO TABS: 15.0000 mg | ORAL_TABLET | Freq: Every day | ORAL | 3 refills | Status: DC | PRN

## 2019-01-13 NOTE — Assessment & Plan Note (Addendum)
Cbc with labs today  ? If reactive from smoking

## 2019-01-13 NOTE — Patient Instructions (Signed)
Keep working on quitting smoking  Good luck with getting chantix covered   Stay active  Try to avoid excess sodium and fat in diet   Labs today   Blood pressure is better

## 2019-01-13 NOTE — Progress Notes (Signed)
Subjective:    Patient ID: Antonio Russell, male    DOB: March 11, 1961, 58 y.o.   MRN: [id number]  HPI Here for f/u of chronic medical problems   Is tired all the time    Wt Readings from Last 3 Encounters:  01/13/19 192 lb 2 oz (87.1 kg)  12/01/18 191 lb 6 oz (86.8 kg)  01/13/18 187 lb 4 oz (84.9 kg)   26.06 kg/m   Smoking status -wants to quit  Ins will only pay for 6 out of 12 months  He took the starter pack and then ins would not pay for the mt     bp is stable today  No cp or palpitations or headaches or edema  No side effects to medicines  BP Readings from Last 3 Encounters:  01/13/19 136/82  12/01/18 (!) 142/94  01/13/18 124/82     Last visit pt stopped amlodipine due to sexual side effects We increased metoprolol xl to 100 mg -no problems with this   Pulse Readings from Last 3 Encounters:  01/13/19 71  12/01/18 81  01/13/18 89    Takes meloxicam as needed  It works well  He did not need to go to orthopedics for knee/feet    Elevated ca in the past  Elevated glucose in the past  Hyperlipidemia Lab Results  Component Value Date   CHOL 217 (H) 10/26/2016   HDL 43.60 10/26/2016   LDLCALC 155 (H) 10/26/2016   LDLDIRECT 150.5 07/25/2009   TRIG 91.0 10/26/2016   CHOLHDL 5 10/26/2016  intolerant of statins   Diet is fairly good   Patient Active Problem List   Diagnosis Date Noted  . Lateral epicondylitis 12/01/2018  . Left knee pain 12/01/2018  . Left foot pain 12/01/2018  . Chronic daily headache 12/06/2017  . Hypercalcemia 11/08/2017  . Headache 11/04/2017  . External hemorrhoid 11/05/2016  . Alcohol use 11/04/2016  . Elevated glucose level 11/02/2016  . Skin lesion of left leg 11/02/2016  . Routine general medical examination at a health care facility 10/25/2016  . Prostate cancer screening 10/25/2016  . Leukocytosis 11/30/2014  . Special screening for malignant neoplasms, colon 02/16/2011  . Arthritis   . Essential hypertension  07/13/2010  . CAD (coronary artery disease)   . Testosterone deficiency 05/25/2009  . Hyperlipidemia 05/25/2009  . 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 RENAL CALCULUS, HX OF 05/11/2009  . TOBACCO USE 09/12/2007  . LOSS, HEARING NOS 10/10/2006   Past Medical History:  Diagnosis Date  . Allergic rhinitis   . Allergy   . Anxiety   . Arthritis   . Back pain   . Chest pain 2012   cardiac cath - nonobstructive, nl LV fxn, rec aggressive med management  . GERD (gastroesophageal reflux disease)   . Headache(784.0)   . HLD (hyperlipidemia)   . Impotence of organic origin   . Other testicular hypofunction   . Personal history of urinary calculi   . Sleep apnea   . Tobacco use disorder   . Unspecified hearing loss    Past Surgical History:  Procedure Laterality Date  . COLONOSCOPY    . HEMORRHOID SURGERY    . KNEE SURGERY Right 2013  . NASAL SEPTUM SURGERY     Social History   Tobacco Use  . Smoking status: Current Every Day Smoker    Packs/day: 0.50    Years: 35.00    Pack years: 17.50    Types: Cigarettes  . Smokeless tobacco: Former  User  Substance Use Topics  . Alcohol use: Yes    Alcohol/week: 0.0 standard drinks    Comment: 6 pack 3 times weekly  . Drug use: No   Family History  Problem Relation Age of Onset  . Hypertension Mother   . Hyperlipidemia Mother   . Diabetes Mother   . Hypertension Father   . Hyperlipidemia Father   . Cancer Father        throat  . Hypertension Sister   . Lung cancer Unknown        GF  . Heart disease Unknown        GM  . Esophageal cancer Paternal Uncle   . Colon cancer Neg Hx   . Stomach cancer Neg Hx   . Rectal cancer Neg Hx    Allergies  Allergen Reactions  . Crestor [Rosuvastatin Calcium] Other (See Comments)    Muscle aches  . Lipitor [Atorvastatin Calcium] Other (See Comments)    Arm soreness   . Sertraline Hcl Other (See Comments)    REACTION: muscle twitches  . Zocor [Simvastatin - High Dose] Other  (See Comments)    Muscle pain   . Amlodipine     Sexual side effects   Current Outpatient Medications on File Prior to Visit  Medication Sig Dispense Refill  . fluticasone (FLONASE) 50 MCG/ACT nasal spray SPRAY 2 SPRAYS INTO EACH NOSTRIL EVERY DAY 16 g 5  . metoprolol succinate (TOPROL-XL) 100 MG 24 hr tablet Take 1 tablet (100 mg total) by mouth daily. Take with or immediately following a meal. 30 tablet 11  . varenicline (CHANTIX STARTING MONTH PAK) 0.5 MG X 11 & 1 MG X 42 tablet TAKE AS DIRECTED ON PACKAGE 53 each 0  . varenicline (CHANTIX) 1 MG tablet Take 1 tablet (1 mg total) by mouth 2 (two) times daily. Start after starter pack 60 tablet 3   No current facility-administered medications on file prior to visit.     Review of Systems  Constitutional: Negative for activity change, appetite change, fatigue, fever and unexpected weight change.  HENT: Negative for congestion, rhinorrhea, sore throat and trouble swallowing.   Eyes: Negative for pain, redness, itching and visual disturbance.  Respiratory: Negative for cough, chest tightness, shortness of breath and wheezing.   Cardiovascular: Negative for chest pain and palpitations.  Gastrointestinal: Negative for abdominal pain, blood in stool, constipation, diarrhea and nausea.  Endocrine: Negative for cold intolerance, heat intolerance, polydipsia and polyuria.  Genitourinary: Negative for difficulty urinating, dysuria, frequency and urgency.  Musculoskeletal: Positive for arthralgias. Negative for joint swelling and myalgias.  Skin: Negative for pallor and rash.  Neurological: Negative for dizziness, tremors, weakness, numbness and headaches.  Hematological: Negative for adenopathy. Does not bruise/bleed easily.  Psychiatric/Behavioral: Negative for decreased concentration and dysphoric mood. The patient is not nervous/anxious.        Objective:   Physical Exam Constitutional:      General: He is not in acute distress.     Appearance: He is well-developed and normal weight. He is not ill-appearing or diaphoretic.  HENT:     Head: Normocephalic and atraumatic.  Eyes:     General: No scleral icterus.    Conjunctiva/sclera: Conjunctivae normal.     Pupils: Pupils are equal, round, and reactive to light.  Neck:     Musculoskeletal: Normal range of motion and neck supple. No neck rigidity or muscular tenderness.     Thyroid: No thyromegaly.     Vascular: No carotid bruit  or JVD.  Cardiovascular:     Rate and Rhythm: Normal rate and regular rhythm.     Heart sounds: Normal heart sounds. No gallop.   Pulmonary:     Effort: Pulmonary effort is normal. No respiratory distress.     Breath sounds: Normal breath sounds. No wheezing or rales.  Abdominal:     General: Bowel sounds are normal. There is no distension or abdominal bruit.     Palpations: Abdomen is soft. There is no mass.     Tenderness: There is no abdominal tenderness.  Musculoskeletal:     Right lower leg: No edema.     Left lower leg: No edema.  Lymphadenopathy:     Cervical: No cervical adenopathy.  Skin:    General: Skin is warm and dry.     Findings: No rash.  Neurological:     Mental Status: He is alert.     Coordination: Coordination normal.     Deep Tendon Reflexes: Reflexes are normal and symmetric. Reflexes normal.  Psychiatric:        Mood and Affect: Mood normal.           Assessment & Plan:   Problem List Items Addressed This Visit      Cardiovascular and Mediastinum   Essential hypertension - Primary    bp in fair control at this time  BP Readings from Last 1 Encounters:  01/13/19 136/82   No changes needed-improved with inc in metoprolol Most recent labs reviewed  Disc lifstyle change with low sodium diet and exercise        Relevant Orders   CBC with Differential/Platelet   Comprehensive metabolic panel   Lipid panel   TSH     Other   Hyperlipidemia    Lab today  CAD and smoker Intol to statins        Relevant Orders   Comprehensive metabolic panel   Lipid panel   TOBACCO USE    Disc in detail risks of smoking and possible outcomes including copd, vascular/ heart disease, cancer , respiratory and sinus infections  Pt voices understanding Pt is motivated to quit Currently waiting to see if ins will cover chantix - which works well for him      Leukocytosis    Cbc with labs today  ? If reactive from smoking       Elevated glucose level    A1C with labs today  disc imp of low glycemic diet and wt loss to prevent DM2       Relevant Orders   Hemoglobin A1c   Hypercalcemia    Ca re checked with PTH and vit D levels No symptoms       Relevant Orders   VITAMIN D 25 Hydroxy (Vit-D Deficiency, Fractures)   PTH, Intact and Calcium

## 2019-01-13 NOTE — Assessment & Plan Note (Signed)
Lab today  CAD and smoker Intol to statins

## 2019-01-13 NOTE — Assessment & Plan Note (Signed)
A1C with labs today  disc imp of low glycemic diet and wt loss to prevent DM2  

## 2019-01-13 NOTE — Assessment & Plan Note (Signed)
Disc in detail risks of smoking and possible outcomes including copd, vascular/ heart disease, cancer , respiratory and sinus infections  Pt voices understanding Pt is motivated to quit Currently waiting to see if ins will cover chantix - which works well for him

## 2019-01-13 NOTE — Assessment & Plan Note (Signed)
bp in fair control at this time  BP Readings from Last 1 Encounters:  01/13/19 136/82   No changes needed-improved with inc in metoprolol Most recent labs reviewed  Disc lifstyle change with low sodium diet and exercise

## 2019-01-13 NOTE — Assessment & Plan Note (Signed)
Ca re checked with PTH and vit D levels No symptoms

## 2019-01-14 ENCOUNTER — Encounter: Payer: Self-pay | Admitting: Family Medicine

## 2019-01-14 DIAGNOSIS — E559 Vitamin D deficiency, unspecified: Secondary | ICD-10-CM | POA: Insufficient documentation

## 2019-01-14 DIAGNOSIS — E210 Primary hyperparathyroidism: Secondary | ICD-10-CM | POA: Insufficient documentation

## 2019-01-14 LAB — CBC WITH DIFFERENTIAL/PLATELET
Basophils Absolute: 0.1 10*3/uL (ref 0.0–0.1)
Basophils Relative: 0.8 % (ref 0.0–3.0)
Eosinophils Absolute: 0.6 10*3/uL (ref 0.0–0.7)
Eosinophils Relative: 5.2 % — ABNORMAL HIGH (ref 0.0–5.0)
HCT: 46.8 % (ref 39.0–52.0)
Hemoglobin: 15.7 g/dL (ref 13.0–17.0)
Lymphocytes Relative: 37 % (ref 12.0–46.0)
Lymphs Abs: 4.3 10*3/uL — ABNORMAL HIGH (ref 0.7–4.0)
MCHC: 33.5 g/dL (ref 30.0–36.0)
MCV: 99.9 fl (ref 78.0–100.0)
Monocytes Absolute: 1.1 10*3/uL — ABNORMAL HIGH (ref 0.1–1.0)
Monocytes Relative: 9.8 % (ref 3.0–12.0)
Neutro Abs: 5.5 10*3/uL (ref 1.4–7.7)
Neutrophils Relative %: 47.2 % (ref 43.0–77.0)
Platelets: 259 10*3/uL (ref 150.0–400.0)
RBC: 4.68 Mil/uL (ref 4.22–5.81)
RDW: 13.5 % (ref 11.5–15.5)
WBC: 11.7 10*3/uL — ABNORMAL HIGH (ref 4.0–10.5)

## 2019-01-14 LAB — COMPREHENSIVE METABOLIC PANEL
ALT: 27 U/L (ref 0–53)
AST: 19 U/L (ref 0–37)
Albumin: 4.4 g/dL (ref 3.5–5.2)
Alkaline Phosphatase: 106 U/L (ref 39–117)
BUN: 19 mg/dL (ref 6–23)
CO2: 24 mEq/L (ref 19–32)
Calcium: 11.5 mg/dL — ABNORMAL HIGH (ref 8.4–10.5)
Chloride: 104 mEq/L (ref 96–112)
Creatinine, Ser: 0.91 mg/dL (ref 0.40–1.50)
GFR: 85.55 mL/min (ref >60.00)
Glucose, Bld: 92 mg/dL (ref 70–99)
Potassium: 4.9 mEq/L (ref 3.5–5.1)
Sodium: 136 mEq/L (ref 135–145)
Total Bilirubin: 0.5 mg/dL (ref 0.2–1.2)
Total Protein: 7 g/dL (ref 6.0–8.3)

## 2019-01-14 LAB — PTH, INTACT AND CALCIUM
Calcium: 11.1 mg/dL — ABNORMAL HIGH (ref 8.6–10.3)
PTH: 137 pg/mL — ABNORMAL HIGH (ref 14–64)

## 2019-01-14 LAB — LIPID PANEL
Cholesterol: 294 mg/dL — ABNORMAL HIGH (ref 0–200)
HDL: 48.4 mg/dL (ref >39.00)
NonHDL: 245.26
Total CHOL/HDL Ratio: 6
Triglycerides: 333 mg/dL — ABNORMAL HIGH (ref 0.0–149.0)
VLDL: 66.6 mg/dL — ABNORMAL HIGH (ref 0.0–40.0)

## 2019-01-14 LAB — HEMOGLOBIN A1C: Hgb A1c MFr Bld: 6.3 % (ref 4.6–6.5)

## 2019-01-14 LAB — LDL CHOLESTEROL, DIRECT: Direct LDL: 211 mg/dL

## 2019-01-14 LAB — VITAMIN D 25 HYDROXY (VIT D DEFICIENCY, FRACTURES): VITD: 15.29 ng/mL — ABNORMAL LOW (ref 30.00–100.00)

## 2019-01-14 LAB — TSH: TSH: 1.12 u[IU]/mL (ref 0.35–4.50)

## 2019-04-17 ENCOUNTER — Ambulatory Visit: Payer: 59 | Admitting: Family Medicine

## 2019-05-01 ENCOUNTER — Other Ambulatory Visit: Payer: Self-pay

## 2019-05-01 ENCOUNTER — Ambulatory Visit (INDEPENDENT_AMBULATORY_CARE_PROVIDER_SITE_OTHER): Payer: 59 | Admitting: Family Medicine

## 2019-05-01 ENCOUNTER — Encounter: Payer: Self-pay | Admitting: Family Medicine

## 2019-05-01 VITALS — BP 132/86 | HR 63 | Temp 96.8°F | Ht 72.0 in | Wt 197.3 lb

## 2019-05-01 DIAGNOSIS — I10 Essential (primary) hypertension: Secondary | ICD-10-CM

## 2019-05-01 DIAGNOSIS — F17200 Nicotine dependence, unspecified, uncomplicated: Secondary | ICD-10-CM

## 2019-05-01 DIAGNOSIS — M2550 Pain in unspecified joint: Secondary | ICD-10-CM | POA: Diagnosis not present

## 2019-05-01 DIAGNOSIS — E559 Vitamin D deficiency, unspecified: Secondary | ICD-10-CM

## 2019-05-01 DIAGNOSIS — E210 Primary hyperparathyroidism: Secondary | ICD-10-CM | POA: Diagnosis not present

## 2019-05-01 DIAGNOSIS — Z789 Other specified health status: Secondary | ICD-10-CM

## 2019-05-01 DIAGNOSIS — R7303 Prediabetes: Secondary | ICD-10-CM

## 2019-05-01 DIAGNOSIS — E7800 Pure hypercholesterolemia, unspecified: Secondary | ICD-10-CM

## 2019-05-01 DIAGNOSIS — Z7289 Other problems related to lifestyle: Secondary | ICD-10-CM

## 2019-05-01 LAB — PHOSPHORUS: Phosphorus: 3 mg/dL (ref 2.3–4.6)

## 2019-05-01 LAB — LIPID PANEL
Cholesterol: 308 mg/dL — ABNORMAL HIGH (ref 0–200)
HDL: 52.4 mg/dL (ref >39.00)
NonHDL: 255.26
Total CHOL/HDL Ratio: 6
Triglycerides: 235 mg/dL — ABNORMAL HIGH (ref 0.0–149.0)
VLDL: 47 mg/dL — ABNORMAL HIGH (ref 0.0–40.0)

## 2019-05-01 LAB — SEDIMENTATION RATE: Sed Rate: 20 mm/hr (ref 0–20)

## 2019-05-01 LAB — VITAMIN D 25 HYDROXY (VIT D DEFICIENCY, FRACTURES): VITD: 19.52 ng/mL — ABNORMAL LOW (ref 30.00–100.00)

## 2019-05-01 LAB — LDL CHOLESTEROL, DIRECT: Direct LDL: 225 mg/dL

## 2019-05-01 LAB — HEMOGLOBIN A1C: Hgb A1c MFr Bld: 6.1 % (ref 4.6–6.5)

## 2019-05-01 NOTE — Progress Notes (Signed)
Subjective:    Patient ID: Antonio Russell, male    DOB: 14-May-1960, 59 y.o.   MRN: [id number]  This visit occurred during the SARS-CoV-2 public health emergency.  Safety protocols were in place, including screening questions prior to the visit, additional usage of staff PPE, and extensive cleaning of exam room while observing appropriate contact time as indicated for disinfecting solutions.    HPI Pt presents for 3 mo f/u of chronic medical problems   Wt Readings from Last 3 Encounters:  05/01/19 197 lb 5 oz (89.5 kg)  01/13/19 192 lb 2 oz (87.1 kg)  12/01/18 191 lb 6 oz (86.8 kg)  wt is up about 5 lb  26.76 kg/m   Smoking status -he increased and now back to where he was  Is on chantix now   bp is stable today  No cp or palpitations or headaches or edema  No side effects to medicines  BP Readings from Last 3 Encounters:  05/01/19 132/86  01/13/19 136/82  12/01/18 (!) 142/94     Hyperparathyroidism Lab Results  Component Value Date   PTH 137 (H) 01/13/2019   CALCIUM 11.5 (H) 01/13/2019   CALCIUM 11.1 (H) 01/13/2019    Vit D level was 15.29  He - is taking 2 vit D per day-unsure of units   Has had a h/o sharp severe pains in different areas of feet and legs  This has been worked up in the past-he took [employer] short time   Prediabetes Lab Results  Component Value Date   HGBA1C 6.3 01/13/2019  mother has diabetes  Not a big sweet eater  Changed from candy to nuts  Some pasta -once per week  No chips or snack foods or cereal    Exercise - he can hardly do this - due to pain  Wrists/ elbows and toes -ache  Knees  L hip is the worst  Muscles hurt also  Wants to exercise/walk/golf and just cannot do it     Hyperlipidemia Lab Results  Component Value Date   CHOL 294 (H) 01/13/2019   HDL 48.40 01/13/2019   LDLCALC 155 (H) 10/26/2016   LDLDIRECT 211.0 01/13/2019   TRIG 333.0 (H) 01/13/2019   CHOLHDL 6 01/13/2019   Intolerant of statins in the past -severe  muscle pain  Today fasting  He is eating more greens  Eats beef twice weekly at least  Very little fried foods  Moderate cheese  Rarely eats shellfish    Changing diet in the past made no difference -this is genetic   Patient Active Problem List   Diagnosis Date Noted  . Multiple joint pain 05/01/2019  . Primary hyperparathyroidism ([place]) 01/14/2019  . Vitamin D deficiency 01/14/2019  . Left knee pain 12/01/2018  . Left foot pain 12/01/2018  . Chronic daily headache 12/06/2017  . Hypercalcemia 11/08/2017  . Headache 11/04/2017  . External hemorrhoid 11/05/2016  . Alcohol use 11/04/2016  . Prediabetes 11/02/2016  . Skin lesion of left leg 11/02/2016  . Routine general medical examination at a health care facility 10/25/2016  . Prostate cancer screening 10/25/2016  . Leukocytosis 11/30/2014  . Special screening for malignant neoplasms, colon 02/16/2011  . Arthritis   . Essential hypertension 07/13/2010  . CAD (coronary artery disease)   . Testosterone deficiency 05/25/2009  . Hyperlipidemia 05/25/2009  . 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 RENAL CALCULUS, HX OF 05/11/2009  . TOBACCO USE 09/12/2007  . LOSS, HEARING NOS 10/10/2006   Past Medical  History:  Diagnosis Date  . Allergic rhinitis   . Allergy   . Anxiety   . Arthritis   . Back pain   . Chest pain 2012   cardiac cath - nonobstructive, nl LV fxn, rec aggressive med management  . GERD (gastroesophageal reflux disease)   . Headache(784.0)   . HLD (hyperlipidemia)   . Impotence of organic origin   . Other testicular hypofunction   . Personal history of urinary calculi   . Sleep apnea   . Tobacco use disorder   . Unspecified hearing loss    Past Surgical History:  Procedure Laterality Date  . COLONOSCOPY    . HEMORRHOID SURGERY    . KNEE SURGERY Right 2013  . NASAL SEPTUM SURGERY     Social History   Tobacco Use  . Smoking status: Current Every Day Smoker    Packs/day: 0.50    Years: 35.00     Pack years: 17.50    Types: Cigarettes  . Smokeless tobacco: Former Network engineer Use Topics  . Alcohol use: Yes    Alcohol/week: 0.0 standard drinks    Comment: 6 pack 3 times weekly  . Drug use: No   Family History  Problem Relation Age of Onset  . Hypertension Mother   . Hyperlipidemia Mother   . Diabetes Mother   . Hypertension Father   . Hyperlipidemia Father   . Cancer Father        throat  . Hypertension Sister   . Lung cancer Other        GF  . Heart disease Other        GM  . Esophageal cancer Paternal Uncle   . Colon cancer Neg Hx   . Stomach cancer Neg Hx   . Rectal cancer Neg Hx    Allergies  Allergen Reactions  . Crestor [Rosuvastatin Calcium] Other (See Comments)    Muscle aches  . Lipitor [Atorvastatin Calcium] Other (See Comments)    Arm soreness   . Sertraline Hcl Other (See Comments)    REACTION: muscle twitches  . Zocor [Simvastatin - High Dose] Other (See Comments)    Muscle pain   . Amlodipine     Sexual side effects   Current Outpatient Medications on File Prior to Visit  Medication Sig Dispense Refill  . fluticasone (FLONASE) 50 MCG/ACT nasal spray SPRAY 2 SPRAYS INTO EACH NOSTRIL EVERY DAY 16 g 5  . meloxicam (MOBIC) 15 MG tablet Take 1 tablet (15 mg total) by mouth daily as needed for pain (joint pain). With food 30 tablet 3  . metoprolol succinate (TOPROL-XL) 100 MG 24 hr tablet Take 1 tablet (100 mg total) by mouth daily. Take with or immediately following a meal. 30 tablet 11  . varenicline (CHANTIX STARTING MONTH PAK) 0.5 MG X 11 & 1 MG X 42 tablet TAKE AS DIRECTED ON PACKAGE 53 each 0  . varenicline (CHANTIX) 1 MG tablet Take 1 tablet (1 mg total) by mouth 2 (two) times daily. Start after starter pack 60 tablet 3   No current facility-administered medications on file prior to visit.    Review of Systems  Constitutional: Positive for fatigue. Negative for activity change, appetite change, fever and unexpected weight change.   HENT: Negative for congestion, rhinorrhea, sore throat and trouble swallowing.   Eyes: Negative for pain, redness, itching and visual disturbance.  Respiratory: Negative for cough, chest tightness, shortness of breath and wheezing.   Cardiovascular: Negative for chest pain and  palpitations.  Gastrointestinal: Negative for abdominal pain, blood in stool, constipation, diarrhea and nausea.  Endocrine: Negative for cold intolerance, heat intolerance, polydipsia and polyuria.  Genitourinary: Negative for difficulty urinating, dysuria, frequency and urgency.  Musculoskeletal: Positive for arthralgias, back pain and myalgias. Negative for joint swelling.  Skin: Negative for pallor and rash.  Neurological: Negative for dizziness, tremors, weakness, numbness and headaches.  Hematological: Negative for adenopathy. Does not bruise/bleed easily.  Psychiatric/Behavioral: Negative for decreased concentration and dysphoric mood. The patient is not nervous/anxious.        Objective:   Physical Exam Constitutional:      General: He is not in acute distress.    Appearance: He is well-developed and normal weight. He is not ill-appearing or diaphoretic.  HENT:     Head: Normocephalic and atraumatic.     Mouth/Throat:     Mouth: Mucous membranes are moist.  Eyes:     General: No scleral icterus.    Conjunctiva/sclera: Conjunctivae normal.     Pupils: Pupils are equal, round, and reactive to light.  Neck:     Thyroid: No thyromegaly.     Vascular: No carotid bruit or JVD.     Comments: No thyromegally Cardiovascular:     Rate and Rhythm: Normal rate and regular rhythm.     Heart sounds: Normal heart sounds. No gallop.   Pulmonary:     Effort: Pulmonary effort is normal. No respiratory distress.     Breath sounds: Normal breath sounds. No wheezing or rales.     Comments: Diffusely distant bs  Abdominal:     General: Bowel sounds are normal. There is no distension or abdominal bruit.      Palpations: Abdomen is soft. There is no mass.     Tenderness: There is no abdominal tenderness.  Musculoskeletal:     Cervical back: Normal range of motion and neck supple.     Right lower leg: No edema.     Left lower leg: No edema.     Comments: No acute joint changes  Some myofascial tenderness  (legs)   Lymphadenopathy:     Cervical: No cervical adenopathy.  Skin:    General: Skin is warm and dry.     Findings: No rash.  Neurological:     Mental Status: He is alert.     Coordination: Coordination normal.     Deep Tendon Reflexes: Reflexes are normal and symmetric. Reflexes normal.  Psychiatric:        Mood and Affect: Mood normal.        Cognition and Memory: Cognition and memory normal.           Assessment & Plan:   Problem List Items Addressed This Visit      Cardiovascular and Mediastinum   Essential hypertension    bp in fair control at this time  BP Readings from Last 1 Encounters:  05/01/19 132/86   No changes needed Most recent labs reviewed  Disc lifstyle change with low sodium diet and exercise          Endocrine   Primary hyperparathyroidism ([place]) - Primary    Now taking vitamin D /hope to see imp in ca levels Labs today  Consider endocrinology ref if not improved  ? If this is causing some muscle symptoms       Relevant Orders   PTH, Intact and Calcium   Phosphorus (Completed)     Other   Hyperlipidemia    Intolerant of statins (severe  muscle pain)  Watching diet moreso but still eats beef and some cheese  Disc goals for lipids and reasons to control them Rev last labs with pt Rev low sat fat diet in detail Labs today Disc vascular risks (very high LDL over 200 last check)  He may be agreeable to lipid clinic visit/consideration of pcyk9 inhibitor if we could get coverage      Relevant Orders   Lipid panel (Completed)   TOBACCO USE    Disc in detail risks of smoking and possible outcomes including copd, vascular/ heart disease,  cancer , respiratory and sinus infections  Pt voices understanding Pt is currently taking chantix      Prediabetes    Lab Results  Component Value Date   HGBA1C 6.1 05/01/2019   disc imp of low glycemic diet and wt loss to prevent DM2       Relevant Orders   Hemoglobin A1c (Completed)   Alcohol use    No changes He tries to be mindful and thinks he can cut down  For health recommend 2 or more less drinks per day       Hypercalcemia    Evidence of hyperparathyroidism and low D Taking D3 otc now  Re check labs today Some muscle pain       Relevant Orders   PTH, Intact and Calcium   Phosphorus (Completed)   Vitamin D deficiency    With elevated ca levels and muscle and joint pian Now taking D3 otc Lab today      Relevant Orders   VITAMIN D 25 Hydroxy (Vit-D Deficiency, Fractures) (Completed)   Multiple joint pain    Worse lately- impacting mobility and activity and ability to exercise  ? If related to hyperparathyroidism Checking auto immune joint labs today (last was 2013)      Relevant Orders   ANA   Rheumatoid factor (Completed)   Sedimentation Rate (Completed)

## 2019-05-01 NOTE — Patient Instructions (Addendum)
Avoid red meat/ fried foods/ egg yolks/ fatty breakfast meats/ butter, cheese and high fat dairy/ and shellfish    For blood sugar Try to get most of your carbohydrates from produce (with the exception of white potatoes)  Eat less bread/pasta/rice/snack foods/cereals/sweets and other items from the middle of the grocery store (processed carbs)   Labs today  Plan to follow

## 2019-05-03 NOTE — Assessment & Plan Note (Signed)
No changes He tries to be mindful and thinks he can cut down  For health recommend 2 or more less drinks per day

## 2019-05-03 NOTE — Assessment & Plan Note (Signed)
bp in fair control at this time  BP Readings from Last 1 Encounters:  05/01/19 132/86   No changes needed Most recent labs reviewed  Disc lifstyle change with low sodium diet and exercise

## 2019-05-03 NOTE — Assessment & Plan Note (Signed)
Now taking vitamin D /hope to see imp in ca levels Labs today  Consider endocrinology ref if not improved  ? If this is causing some muscle symptoms

## 2019-05-03 NOTE — Assessment & Plan Note (Signed)
Disc in detail risks of smoking and possible outcomes including copd, vascular/ heart disease, cancer , respiratory and sinus infections  Pt voices understanding Pt is currently taking chantix

## 2019-05-03 NOTE — Assessment & Plan Note (Signed)
Worse lately- impacting mobility and activity and ability to exercise  ? If related to hyperparathyroidism Checking auto immune joint labs today (last was 2013)

## 2019-05-03 NOTE — Assessment & Plan Note (Signed)
Lab Results  Component Value Date   HGBA1C 6.1 05/01/2019   disc imp of low glycemic diet and wt loss to prevent DM2

## 2019-05-03 NOTE — Assessment & Plan Note (Signed)
Evidence of hyperparathyroidism and low D Taking D3 otc now  Re check labs today Some muscle pain

## 2019-05-03 NOTE — Assessment & Plan Note (Signed)
With elevated ca levels and muscle and joint pian Now taking D3 otc Lab today

## 2019-05-03 NOTE — Assessment & Plan Note (Signed)
Intolerant of statins (severe muscle pain)  Watching diet moreso but still eats beef and some cheese  Disc goals for lipids and reasons to control them Rev last labs with pt Rev low sat fat diet in detail Labs today Disc vascular risks (very high LDL over 200 last check)  He may be agreeable to lipid clinic visit/consideration of pcyk9 inhibitor if we could get coverage

## 2019-05-04 LAB — PTH, INTACT AND CALCIUM
Calcium: 11.9 mg/dL — ABNORMAL HIGH (ref 8.6–10.3)
PTH: 133 pg/mL — ABNORMAL HIGH (ref 14–64)

## 2019-05-04 LAB — RHEUMATOID FACTOR: Rheumatoid fact SerPl-aCnc: <14 IU/mL (ref <14)

## 2019-05-04 LAB — ANA: Anti Nuclear Antibody (ANA): NEGATIVE

## 2019-05-05 ENCOUNTER — Telehealth: Payer: Self-pay | Admitting: *Deleted

## 2019-05-05 DIAGNOSIS — E7800 Pure hypercholesterolemia, unspecified: Secondary | ICD-10-CM

## 2019-05-05 DIAGNOSIS — E210 Primary hyperparathyroidism: Secondary | ICD-10-CM

## 2019-05-05 MED ORDER — VITAMIN D (ERGOCALCIFEROL) 1.25 MG (50000 UNIT) PO CAPS: 50000.0000 [IU] | ORAL_CAPSULE | ORAL | 0 refills | Status: DC

## 2019-05-05 NOTE — Telephone Encounter (Signed)
-----   Message from Abner Greenspan, MD sent at 05/04/2019  1:58 PM EST ----- I want to refer him to endocrinology for parathyroid issue Also please call in ergocalciferol 50,000 iu 1 po once weekly for 12 weeks #12 pills no refills    Tell him to continue the D otc as well  I also want to refer him to a lipid specialist in cardiology  The rheumatoid labs for joint pain were negative  Please ask if he is ok with the referrals Thanks

## 2019-05-05 NOTE — Telephone Encounter (Signed)
I put the referrals through and will send to [facility]

## 2019-05-05 NOTE — Telephone Encounter (Signed)
Pt advised of lab results and Dr. Marliss Coots comments. Rx sent to pharmacy and pt agrees with referrals. I advise pt our [facility] will call to schedule appts., please put referrals in

## 2019-05-07 ENCOUNTER — Other Ambulatory Visit: Payer: Self-pay | Admitting: Family Medicine

## 2019-05-07 NOTE — Telephone Encounter (Signed)
Pt had his f/u on 05/01/19, last filled on 01/13/19 #30 tabs with 3 refills

## 2019-05-29 ENCOUNTER — Other Ambulatory Visit: Payer: Self-pay

## 2019-05-29 ENCOUNTER — Encounter: Payer: Self-pay | Admitting: Cardiology

## 2019-05-29 ENCOUNTER — Ambulatory Visit (INDEPENDENT_AMBULATORY_CARE_PROVIDER_SITE_OTHER): Payer: 59 | Admitting: Cardiology

## 2019-05-29 VITALS — BP 138/84 | HR 66 | Ht 72.0 in | Wt 201.0 lb

## 2019-05-29 DIAGNOSIS — I739 Peripheral vascular disease, unspecified: Secondary | ICD-10-CM

## 2019-05-29 DIAGNOSIS — I10 Essential (primary) hypertension: Secondary | ICD-10-CM | POA: Diagnosis not present

## 2019-05-29 DIAGNOSIS — E7800 Pure hypercholesterolemia, unspecified: Secondary | ICD-10-CM

## 2019-05-29 MED ORDER — REPATHA SURECLICK 140 MG/ML ~~LOC~~ SOAJ: 1.0000 "pen " | SUBCUTANEOUS | 11 refills | Status: DC

## 2019-05-29 NOTE — Progress Notes (Signed)
Cardiology Office Note:    Date:  05/29/2019   ID:  MILDRED STROSNIDER, DOB 30-Apr-1960, MRN [id number]  PCP:  Abner Greenspan, MD  Cardiologist:  Kate Sable, MD  Electrophysiologist:  None   Referring MD: Abner Greenspan, MD   Chief Complaint  Patient presents with  . New Patient (Initial Visit)    Pt concern w/ hypercholesterolemia/Pt having fatigue & SOB/ Pt states having some muscle soreness from hip down. Meds verbally reviewed w/ pt.   Antonio Russell is a 59 y.o. male who is being seen today for the evaluation of hyperlipidemia at the request of Tower, Wynelle Fanny, MD.  History of Present Illness:    Antonio Russell is a 59 y.o. male with a hx of hyperlipidemia, current smoker x40+ years, sleep apnea who presents due to hyperlipidemia and buttock pain.  Patient states having buttock pain for several months now.  Pain is worse when he walks and relieved with rest.  Originally, pain affected the left buttocks but now has involve the right buttocks and thighs bilaterally.  He denies chest pain or shortness of breath at rest or with exertion.  He states being diagnosed with hypercalcemia and is scheduled to see an endocrinologist.  He has also been told he has high cholesterol for years now, has tried multiple statin therapy but could not tolerate statins due to severe muscle aches and cramping.  He states taking Chantix to help curb smoking.  Past Medical History:  Diagnosis Date  . Allergic rhinitis   . Allergy   . Anxiety   . Arthritis   . Back pain   . Chest pain 2012   cardiac cath - nonobstructive, nl LV fxn, rec aggressive med management  . GERD (gastroesophageal reflux disease)   . Headache(784.0)   . HLD (hyperlipidemia)   . Impotence of organic origin   . Other testicular hypofunction   . Personal history of urinary calculi   . Sleep apnea   . Tobacco use disorder   . Unspecified hearing loss     Past Surgical History:  Procedure Laterality Date  . COLONOSCOPY    .  HEMORRHOID SURGERY    . KNEE SURGERY Right 2013  . NASAL SEPTUM SURGERY      Current Medications: Current Meds  Medication Sig  . cetirizine (ZYRTEC) 10 MG tablet Take 10 mg by mouth daily.  . fexofenadine (ALLEGRA) 60 MG tablet Take 60 mg by mouth daily.  . fluticasone (FLONASE) 50 MCG/ACT nasal spray SPRAY 2 SPRAYS INTO EACH NOSTRIL EVERY DAY  . meloxicam (MOBIC) 15 MG tablet TAKE 1 TABLET (15 MG TOTAL) BY MOUTH DAILY AS NEEDED FOR PAIN (JOINT PAIN). WITH FOOD  . metoprolol succinate (TOPROL-XL) 100 MG 24 hr tablet Take 1 tablet (100 mg total) by mouth daily. Take with or immediately following a meal.  . varenicline (CHANTIX STARTING MONTH PAK) 0.5 MG X 11 & 1 MG X 42 tablet TAKE AS DIRECTED ON PACKAGE  . varenicline (CHANTIX) 1 MG tablet Take 1 tablet (1 mg total) by mouth 2 (two) times daily. Start after starter pack  . Vitamin D, Ergocalciferol, (DRISDOL) 1.25 MG (50000 UNIT) CAPS capsule Take 1 capsule (50,000 Units total) by mouth every 7 (seven) days. For 12 weeks     Allergies:   Crestor [rosuvastatin calcium], Lipitor [atorvastatin calcium], Sertraline hcl, Zocor [simvastatin - high dose], and Amlodipine   Social History   Socioeconomic History  . Marital status: Married  Spouse name: Not on file  . Number of children: Not on file  . Years of education: Not on file  . Highest education level: Not on file  Occupational History  . Not on file  Tobacco Use  . Smoking status: Current Every Day Smoker    Packs/day: 0.50    Years: 35.00    Pack years: 17.50    Types: Cigarettes  . Smokeless tobacco: Former Network engineer and Sexual Activity  . Alcohol use: Yes    Alcohol/week: 0.0 standard drinks    Comment: 6 pack 3 times weekly  . Drug use: No  . Sexual activity: Not on file  Other Topics Concern  . Not on file  Social History Narrative   Coaches basketball   Social Determinants of Health   Financial Resource Strain:   . Difficulty of Paying Living  Expenses: Not on file  Food Insecurity:   . Worried About Charity fundraiser in the Last Year: Not on file  . Ran Out of Food in the Last Year: Not on file  Transportation Needs:   . Lack of Transportation (Medical): Not on file  . Lack of Transportation (Non-Medical): Not on file  Physical Activity:   . Days of Exercise per Week: Not on file  . Minutes of Exercise per Session: Not on file  Stress:   . Feeling of Stress : Not on file  Social Connections:   . Frequency of Communication with Friends and Family: Not on file  . Frequency of Social Gatherings with Friends and Family: Not on file  . Attends Religious Services: Not on file  . Active Member of Clubs or Organizations: Not on file  . Attends Archivist Meetings: Not on file  . Marital Status: Not on file     Family History: The patient's family history includes Cancer in his father; Diabetes in his mother; Esophageal cancer in his paternal uncle; Heart disease in an other family member; Hyperlipidemia in his father and mother; Hypertension in his father, mother, and sister; Lung cancer in an other family member. There is no history of Colon cancer, Stomach cancer, or Rectal cancer.  ROS:   Please see the history of present illness.     All other systems reviewed and are negative.  EKGs/Labs/Other Studies Reviewed:    The following studies were reviewed today:   EKG:  EKG is  ordered today.  The ekg ordered today demonstrates sinus rhythm, normal ECG.  Recent Labs: 01/13/2019: ALT 27; BUN 19; Creatinine, Ser 0.91; Hemoglobin 15.7; Platelets 259.0; Potassium 4.9; Sodium 136; TSH 1.12  Recent Lipid Panel    Component Value Date/Time   CHOL 308 (H) 05/01/2019 0938   TRIG 235.0 (H) 05/01/2019 0938   HDL 52.40 05/01/2019 0938   CHOLHDL 6 05/01/2019 0938   VLDL 47.0 (H) 05/01/2019 0938   LDLCALC 155 (H) 10/26/2016 0826   LDLDIRECT 225.0 05/01/2019 0938    Physical Exam:    VS:  BP 138/84 (BP Location:  Right Arm, Patient Position: Sitting, Cuff Size: Normal)   Pulse 66   Ht 6' (1.829 m)   Wt 201 lb (91.2 kg)   SpO2 98%   BMI 27.26 kg/m     Wt Readings from Last 3 Encounters:  05/29/19 201 lb (91.2 kg)  05/01/19 197 lb 5 oz (89.5 kg)  01/13/19 192 lb 2 oz (87.1 kg)     GEN:  Well nourished, well developed in no acute distress HEENT: Normal  NECK: No JVD; No carotid bruits LYMPHATICS: No lymphadenopathy CARDIAC: RRR, no murmurs, rubs, gallops RESPIRATORY:  Clear to auscultation without rales, wheezing or rhonchi  ABDOMEN: Soft, non-tender, non-distended MUSCULOSKELETAL:  No edema; No deformity  SKIN: Warm and dry NEUROLOGIC:  Alert and oriented x 3 PSYCHIATRIC:  Normal affect   ASSESSMENT:    1. Pure hypercholesterolemia   2. Claudication ([place])   3. Essential hypertension    PLAN:    In order of problems listed above:  1. Patient with history of hyperlipidemia.  His 10-year ASCVD risk score is 23.4%.  He is intolerant to statins.  Start Repatha.  Repeat fasting lipid panel in 3 months. 2. Patient with worsening buttock claudication.  He has risk factors for PAD including hypertension, long smoking history, hyperlipidemia.  Obtain an ABI.  Depending on findings on ABI, will consider additional testing for PAD. 3. History of hypertension.  Blood pressure is adequately controlled.  Continue beta-blocker as prescribed.  Follow-up after ABI.  This note was generated in part or whole with voice recognition software. Voice recognition is usually quite accurate but there are transcription errors that can and very often do occur. I apologize for any typographical errors that were not detected and corrected.  Medication Adjustments/Labs and Tests Ordered: Current medicines are reviewed at length with the patient today.  Concerns regarding medicines are outlined above.  Orders Placed This Encounter  Procedures  . Lipid panel  . EKG 12-Lead  . VAS [place] LOWER EXTREMITY ARTERIAL  DUPLEX  . VAS [place] ABI WITH/WO TBI   Meds ordered this encounter  Medications  . Evolocumab (REPATHA SURECLICK) [id number] MG/ML SOAJ    Sig: Inject 1 pen into the skin every 14 (fourteen) days.    Dispense:  2 pen    Refill:  11    Patient Instructions  Medication Instructions:  Your physician has recommended you make the following change in your medication:  1- START Repatha 120 mg/auto inject pen every 14 days.  *If you need a refill on your cardiac medications before your next appointment, please call your pharmacy*   Lab Work: Your physician recommends that you return for lab work in: about 3 months around May 26th to check [place] will need to be fasting. Please do not have anything to eat or drink after midnight the morning you have the lab work. You may only have water or black coffee with no cream or sugar. - Please go to the [facility]. You will check in at the front desk to the right as you walk into the atrium. Valet Parking is offered if needed. - No appointment needed. You may go any day between 7 am and 6 pm.  If you have labs (blood work) drawn today and your tests are completely normal, you will receive your results only by: [name] MyChart Message (if you have MyChart) OR . A paper copy in the mail If you have any lab test that is abnormal or we need to change your treatment, we will call you to review the results.   Testing/Procedures: Your physician has requested that you have an ankle brachial index (ABI). During this test an ultrasound and blood pressure cuff are used to evaluate the arteries that supply the arms and legs with blood. Allow thirty minutes for this exam. There are no restrictions or special instructions.  Your physician has requested that you have a lower extremity arterial doppler- During this test, ultrasound is used to evaluate  arterial blood flow in the legs. Allow approximately one hour for this exam.   Follow-Up: At [facility], you and  your health needs are our priority.  As part of our continuing mission to provide you with exceptional heart care, we have created designated Provider Care Teams.  These Care Teams include your primary Cardiologist (physician) and Advanced Practice Providers (APPs -  Physician Assistants and Nurse Practitioners) who all work together to provide you with the care you need, when you need it.  We recommend signing up for the patient portal called "MyChart".  Sign up information is provided on this After Visit Summary.  MyChart is used to connect with patients for Virtual Visits (Telemedicine).  Patients are able to view lab/test results, encounter notes, upcoming appointments, etc.  Non-urgent messages can be sent to your provider as well.   To learn more about what you can do with MyChart, go to [email or web address].    Your next appointment:   After testing completed.   The format for your next appointment:   In Person  Provider:   Kate Sable, MD   Ankle-Brachial Index Test Why am I having this test? The ankle-brachial index (ABI) test is used to diagnose peripheral vascular disease (PVD). PVD is also known as peripheral arterial disease (PAD). PVD is the blocking or hardening of the arteries anywhere within the circulatory system beyond the heart. PVD is caused by:  Cholesterol deposits in your blood vessels (atherosclerosis). This is the most common cause of this condition.  Irritation and swelling (inflammation) in the blood vessels.  Blood clots in the vessels. Cholesterol deposits cause arteries to narrow. Normal delivery of oxygen to your tissues is affected, causing muscle pain and fatigue. This is called claudication. PVD means that there may also be a buildup of cholesterol:  In your heart. This increases the risk of heart attacks.  In your brain. This increases the risk of strokes. What is being tested? The ankle-brachial index test measures the blood flow in your  arms and legs. The blood flow will show if blood vessels in your legs have been narrowed by cholesterol deposits. How do I prepare for this test?  Wear loose clothing.  Do not use any tobacco products, including cigarettes, chewing tobacco, or e-cigarettes, for at least 30 minutes before the test. What happens during the test?  1. You will lie down in a resting position. 2. Your health care provider will use a blood pressure machine and a small ultrasound device (Doppler) to measure the systolic pressures on your upper arms and ankles. Systolic pressure is the pressure inside your arteries when your heart pumps. 3. Systolic pressure measurements will be taken several times, and at several points, on both the ankle and the arm. 4. Your health care provider will divide the highest systolic pressure of the ankle by the highest systolic pressure of the arm. The result is the ankle-brachial pressure ratio, or ABI. Sometimes this test will be repeated after you have exercised on a treadmill for 5 minutes. You may have leg pain during the exercise portion of the test if you suffer from PVD. If the index number drops after exercise, this may show that PVD is present. How are the results reported? Your test results will be reported as a value that shows the ratio of your ankle pressure to your arm pressure (ABI ratio). Your health care provider will compare your results to normal ranges that were established after testing a large group of people (  reference ranges). Reference ranges may vary among labs and hospitals. For this test, a common reference range is:  ABI ratio of 0.9 to 1.3. What do the results mean? An ABI ratio that is below the reference range is considered abnormal and may indicate PVD in the legs. Talk with your health care provider about what your results mean. Questions to ask your health care provider Ask your health care provider, or the department that is doing the test:  When will  my results be ready?  How will I get my results?  What are my treatment options?  What other tests do I need?  What are my next steps? Summary  The ankle-brachial index (ABI) test is used to diagnose peripheral vascular disease (PVD). PVD is also known as peripheral arterial disease (PAD).  The ankle-brachial index test measures the blood flow in your arms and legs.  The highest systolic pressure of the ankle is divided by the highest systolic pressure of the arm. The result is the ABI ratio.  An ABI ratio that is below 0.9 is considered abnormal and may indicate PVD in the legs. This information is not intended to replace advice given to you by your health care provider. Make sure you discuss any questions you have with your health care provider. Document Revised: 01/09/2018 Document Reviewed: 12/11/2016 Elsevier Patient Education  2020 Snyder, Kate Sable, MD  05/29/2019 5:22 PM    [place]

## 2019-05-29 NOTE — Patient Instructions (Signed)
Medication Instructions:  Your physician has recommended you make the following change in your medication:  1- START Repatha 120 mg/auto inject pen every 14 days.  *If you need a refill on your cardiac medications before your next appointment, please call your pharmacy*   Lab Work: Your physician recommends that you return for lab work in: about 3 months around May 26th to check [place] will need to be fasting. Please do not have anything to eat or drink after midnight the morning you have the lab work. You may only have water or black coffee with no cream or sugar. - Please go to the [facility]. You will check in at the front desk to the right as you walk into the atrium. Valet Parking is offered if needed. - No appointment needed. You may go any day between 7 am and 6 pm.  If you have labs (blood work) drawn today and your tests are completely normal, you will receive your results only by: [name] MyChart Message (if you have MyChart) OR . A paper copy in the mail If you have any lab test that is abnormal or we need to change your treatment, we will call you to review the results.   Testing/Procedures: Your physician has requested that you have an ankle brachial index (ABI). During this test an ultrasound and blood pressure cuff are used to evaluate the arteries that supply the arms and legs with blood. Allow thirty minutes for this exam. There are no restrictions or special instructions.  Your physician has requested that you have a lower extremity arterial doppler- During this test, ultrasound is used to evaluate arterial blood flow in the legs. Allow approximately one hour for this exam.   Follow-Up: At [facility], you and your health needs are our priority.  As part of our continuing mission to provide you with exceptional heart care, we have created designated Provider Care Teams.  These Care Teams include your primary Cardiologist (physician) and Advanced Practice Providers  (APPs -  Physician Assistants and Nurse Practitioners) who all work together to provide you with the care you need, when you need it.  We recommend signing up for the patient portal called "MyChart".  Sign up information is provided on this After Visit Summary.  MyChart is used to connect with patients for Virtual Visits (Telemedicine).  Patients are able to view lab/test results, encounter notes, upcoming appointments, etc.  Non-urgent messages can be sent to your provider as well.   To learn more about what you can do with MyChart, go to [email or web address].    Your next appointment:   After testing completed.   The format for your next appointment:   In Person  Provider:   Kate Sable, MD   Ankle-Brachial Index Test Why am I having this test? The ankle-brachial index (ABI) test is used to diagnose peripheral vascular disease (PVD). PVD is also known as peripheral arterial disease (PAD). PVD is the blocking or hardening of the arteries anywhere within the circulatory system beyond the heart. PVD is caused by:  Cholesterol deposits in your blood vessels (atherosclerosis). This is the most common cause of this condition.  Irritation and swelling (inflammation) in the blood vessels.  Blood clots in the vessels. Cholesterol deposits cause arteries to narrow. Normal delivery of oxygen to your tissues is affected, causing muscle pain and fatigue. This is called claudication. PVD means that there may also be a buildup of cholesterol:  In your heart. This increases  the risk of heart attacks.  In your brain. This increases the risk of strokes. What is being tested? The ankle-brachial index test measures the blood flow in your arms and legs. The blood flow will show if blood vessels in your legs have been narrowed by cholesterol deposits. How do I prepare for this test?  Wear loose clothing.  Do not use any tobacco products, including cigarettes, chewing tobacco, or e-cigarettes,  for at least 30 minutes before the test. What happens during the test?  1. You will lie down in a resting position. 2. Your health care provider will use a blood pressure machine and a small ultrasound device (Doppler) to measure the systolic pressures on your upper arms and ankles. Systolic pressure is the pressure inside your arteries when your heart pumps. 3. Systolic pressure measurements will be taken several times, and at several points, on both the ankle and the arm. 4. Your health care provider will divide the highest systolic pressure of the ankle by the highest systolic pressure of the arm. The result is the ankle-brachial pressure ratio, or ABI. Sometimes this test will be repeated after you have exercised on a treadmill for 5 minutes. You may have leg pain during the exercise portion of the test if you suffer from PVD. If the index number drops after exercise, this may show that PVD is present. How are the results reported? Your test results will be reported as a value that shows the ratio of your ankle pressure to your arm pressure (ABI ratio). Your health care provider will compare your results to normal ranges that were established after testing a large group of people (reference ranges). Reference ranges may vary among labs and hospitals. For this test, a common reference range is:  ABI ratio of 0.9 to 1.3. What do the results mean? An ABI ratio that is below the reference range is considered abnormal and may indicate PVD in the legs. Talk with your health care provider about what your results mean. Questions to ask your health care provider Ask your health care provider, or the department that is doing the test:  When will my results be ready?  How will I get my results?  What are my treatment options?  What other tests do I need?  What are my next steps? Summary  The ankle-brachial index (ABI) test is used to diagnose peripheral vascular disease (PVD). PVD is also known  as peripheral arterial disease (PAD).  The ankle-brachial index test measures the blood flow in your arms and legs.  The highest systolic pressure of the ankle is divided by the highest systolic pressure of the arm. The result is the ABI ratio.  An ABI ratio that is below 0.9 is considered abnormal and may indicate PVD in the legs. This information is not intended to replace advice given to you by your health care provider. Make sure you discuss any questions you have with your health care provider. Document Revised: 01/09/2018 Document Reviewed: 12/11/2016 Elsevier Patient Education  2020 [employer].

## 2019-05-30 ENCOUNTER — Ambulatory Visit: Payer: 59 | Attending: Internal Medicine

## 2019-05-30 DIAGNOSIS — Z23 Encounter for immunization: Secondary | ICD-10-CM

## 2019-05-30 NOTE — Progress Notes (Signed)
   Covid-19 Vaccination Clinic  Name:  Antonio Russell    MRN: [id number] DOB: 08/31/1960  05/30/2019  Antonio Russell was observed post Covid-19 immunization for 15 minutes without incidence. He was provided with Vaccine Information Sheet and instruction to access the V-Safe system.   Antonio Russell was instructed to call 911 with any severe reactions post vaccine: [name] Difficulty breathing  . Swelling of your face and throat  . A fast heartbeat  . A bad rash all over your body  . Dizziness and weakness    Immunizations Administered    Name Date Dose VIS Date Route   Moderna COVID-19 Vaccine 05/30/2019  1:02 PM 0.5 mL 03/03/2019 Intramuscular   Manufacturer: Moderna   Lot: [id number]   [id number]

## 2019-06-04 ENCOUNTER — Other Ambulatory Visit: Payer: Self-pay

## 2019-06-04 MED ORDER — REPATHA SURECLICK 140 MG/ML ~~LOC~~ SOAJ: 1.0000 "pen " | SUBCUTANEOUS | 11 refills | Status: DC

## 2019-06-04 NOTE — Progress Notes (Signed)
Patient ID: Antonio Russell, male   DOB: 02/09/1961, 59 y.o.   MRN: [id number]   This visit occurred during the SARS-CoV-2 public health emergency.  Safety protocols were in place, including screening questions prior to the visit, additional usage of staff PPE, and extensive cleaning of exam room while observing appropriate contact time as indicated for disinfecting solutions.   HPI  Antonio Russell is a 59 y.o.-yearr-old male, referred by his PCP, Dr. Glori Bickers, for evaluation for hypercalcemia/hyperparathyroidism.  Pt has had hypercalcemia for at least 10 years. I reviewed pt's pertinent labs: Lab Results  Component Value Date   PTH 133 (H) 05/01/2019   PTH 137 (H) 01/13/2019   CALCIUM 11.9 (H) 05/01/2019   CALCIUM 11.5 (H) 01/13/2019   CALCIUM 11.1 (H) 01/13/2019   CALCIUM 11.5 (H) 11/04/2017   CALCIUM 11.0 (H) 10/26/2016   CALCIUM 11.3 (H) 11/10/2014   CALCIUM 10.2 04/10/2011   CALCIUM 10.4 07/05/2010   CALCIUM 10.6 (H) 05/11/2009   Phosphorus level was not low:  Lab Results  Component Value Date   PHOS 3.0 05/01/2019   No components found for: MAGNESIUM    No history of osteoporosis/fractures/falls.  No previous DXA scan records.  No h/o kidney stones but several years ago had pain R back >> ? nephrolithiasis.  No h/o CKD. Last BUN/Cr: Lab Results  Component Value Date   BUN 19 01/13/2019   BUN 15 11/04/2017   CREATININE 0.91 01/13/2019   CREATININE 1.18 11/04/2017   Pt is not on HCTZ.  He has a h/o vitamin D deficiency. Reviewed vit D levels: Lab Results  Component Value Date   VD25OH 19.52 (L) 05/01/2019   VD25OH 15.29 (L) 01/13/2019   He is not on calcium but takes 6/7 days: 2000 IU daily (started 0/2020) + ergocalciferol 50,000 units weekly (started 04/2019).  Pt does not have a FH of hypercalcemia, pituitary tumors, osteoporosis.  Half-sister with ThyCA.  Pt. also has a history of L posterior thigh muscles pain and weakness 4 years ago >> now bilateral >> also  anterior aspects of thighs.  He also has a h/o chest pain - s/p recent catheterization, HTN, HL - will start a PCSK9 inhibitor, high WBC.  ROS: Constitutional: + Weight gain, + fatigue, no subjective hyperthermia/hypothermia, + poor sleep Eyes: no blurry vision, no xerophthalmia ENT: no sore throat, no nodules palpated in throat, no dysphagia/odynophagia, no hoarseness Cardiovascular: no CP/SOB/palpitations/leg swelling Respiratory: no cough/SOB Gastrointestinal: no N/V/D/C Musculoskeletal: + Muscle/+ joint aches Skin: no rashes Neurological: no tremors/numbness/tingling/dizziness, + headaches Psychiatric: no depression/anxiety + Low libido, difficulty with erections.  Past Medical History:  Diagnosis Date  . Allergic rhinitis   . Allergy   . Anxiety   . Arthritis   . Back pain   . Chest pain 2012   cardiac cath - nonobstructive, nl LV fxn, rec aggressive med management  . GERD (gastroesophageal reflux disease)   . Headache(784.0)   . HLD (hyperlipidemia)   . Impotence of organic origin   . Other testicular hypofunction   . Personal history of urinary calculi   . Sleep apnea   . Tobacco use disorder   . Unspecified hearing loss    Past Surgical History:  Procedure Laterality Date  . COLONOSCOPY    . HEMORRHOID SURGERY    . KNEE SURGERY Right 2013  . NASAL SEPTUM SURGERY     Social History   Socioeconomic History  . Marital status: Married    Spouse name: Not on file  .  Number of children: 3  . Years of education: Not on file  . Highest education level: Not on file  Occupational History  .  Glass blower/designer  Tobacco Use  . Smoking status: Current Every Day Smoker    Packs/day: 0.50    Years: 35.00    Pack years: 17.50    Types: Cigarettes  . Smokeless tobacco: Former Network engineer and Sexual Activity  . Alcohol use: Yes    Alcohol/week:  3-4 beers 5 days a week      . Drug use: No  . Sexual activity: Not on file  Other Topics Concern  . Not on file   Social History Narrative   Coaches basketball   Social Determinants of Health   Financial Resource Strain:   . Difficulty of Paying Living Expenses: Not on file  Food Insecurity:   . Worried About Charity fundraiser in the Last Year: Not on file  . Ran Out of Food in the Last Year: Not on file  Transportation Needs:   . Lack of Transportation (Medical): Not on file  . Lack of Transportation (Non-Medical): Not on file  Physical Activity:   . Days of Exercise per Week: Not on file  . Minutes of Exercise per Session: Not on file  Stress:   . Feeling of Stress : Not on file  Social Connections:   . Frequency of Communication with Friends and Family: Not on file  . Frequency of Social Gatherings with Friends and Family: Not on file  . Attends Religious Services: Not on file  . Active Member of Clubs or Organizations: Not on file  . Attends Archivist Meetings: Not on file  . Marital Status: Not on file  Intimate Partner Violence:   . Fear of Current or Ex-Partner: Not on file  . Emotionally Abused: Not on file  . Physically Abused: Not on file  . Sexually Abused: Not on file   Current Outpatient Medications on File Prior to Visit  Medication Sig Dispense Refill  . Evolocumab (REPATHA SURECLICK) [id number] MG/ML SOAJ Inject 1 pen into the skin every 14 (fourteen) days. 2 pen 11  . fexofenadine (ALLEGRA) 60 MG tablet Take 60 mg by mouth daily.    . fluticasone (FLONASE) 50 MCG/ACT nasal spray SPRAY 2 SPRAYS INTO EACH NOSTRIL EVERY DAY 16 g 5  . meloxicam (MOBIC) 15 MG tablet TAKE 1 TABLET (15 MG TOTAL) BY MOUTH DAILY AS NEEDED FOR PAIN (JOINT PAIN). WITH FOOD 30 tablet 3  . metoprolol succinate (TOPROL-XL) 100 MG 24 hr tablet Take 1 tablet (100 mg total) by mouth daily. Take with or immediately following a meal. 30 tablet 11  . varenicline (CHANTIX STARTING MONTH PAK) 0.5 MG X 11 & 1 MG X 42 tablet TAKE AS DIRECTED ON PACKAGE 53 each 0  . Vitamin D, Ergocalciferol, (DRISDOL)  1.25 MG (50000 UNIT) CAPS capsule Take 1 capsule (50,000 Units total) by mouth every 7 (seven) days. For 12 weeks 12 capsule 0  . cetirizine (ZYRTEC) 10 MG tablet Take 10 mg by mouth daily.    . varenicline (CHANTIX) 1 MG tablet Take 1 tablet (1 mg total) by mouth 2 (two) times daily. Start after starter pack (Patient not taking: Reported on 06/05/2019) 60 tablet 3   No current facility-administered medications on file prior to visit.   Allergies  Allergen Reactions  . Crestor [Rosuvastatin Calcium] Other (See Comments)    Muscle aches  . Lipitor [Atorvastatin Calcium] Other (See  Comments)    Arm soreness   . Sertraline Hcl Other (See Comments)    REACTION: muscle twitches  . Zocor [Simvastatin - High Dose] Other (See Comments)    Muscle pain   . Amlodipine     Sexual side effects   Family History  Problem Relation Age of Onset  . Hypertension Mother   . Hyperlipidemia Mother   . Diabetes Mother   . Hypertension Father   . Hyperlipidemia Father   . Cancer Father        throat  . Hypertension Sister   . Lung cancer Other        GF  . Heart disease Other        GM  . Esophageal cancer Paternal Uncle   . Colon cancer Neg Hx   . Stomach cancer Neg Hx   . Rectal cancer Neg Hx    PE: BP 128/90   Pulse 78   Ht 6' (1.829 m)   Wt 196 lb (88.9 kg)   SpO2 98%   BMI 26.58 kg/m  Wt Readings from Last 3 Encounters:  06/05/19 196 lb (88.9 kg)  05/29/19 201 lb (91.2 kg)  05/01/19 197 lb 5 oz (89.5 kg)   Constitutional: overweight, in NAD. No kyphosis. Eyes: PERRLA, EOMI, no exophthalmos ENT: moist mucous membranes, no thyromegaly, no cervical lymphadenopathy Cardiovascular: RRR, No MRG Respiratory: CTA B Gastrointestinal: abdomen soft, NT, ND, BS+ Musculoskeletal: no deformities, strength intact in all 4 Skin: moist, warm, no rashes Neurological: no tremor with outstretched hands, DTR normal in all 4  Assessment: 1. Hypercalcemia/hyperparathyroidism  Plan: Patient has  had several instances of elevated calcium, with the highest level being at 11.9. An intact PTH level was also high, at 137 for a calcium of 11.1-11.5.  - Patient also  has vitamin D deficiency, with a level of 15 in 01/2019, when he was started 50,000 units ergocalciferol weekly.  The  latest level was 19 in 04/2019, when he added 2000 units 6 out of 7 days.  - No apparent complications from hypercalcemia: no h/o nephrolithiasis (he had back pain at one point but no nephrolithiasis diagnosed), no osteoporosis, no fractures. No abdominal pain, depression, bone pain.  He does have joint and muscle pains. - I discussed with the patient about the physiology of calcium and parathyroid hormone, and possible side effects from increased PTH, including kidney stones, osteoporosis, abdominal pain, etc.  - We discussed that we need to check whether his hyperparathyroidism is primary (Familial hypercalcemic hypocalciuria or parathyroid adenoma) or secondary (to conditions like: vitamin D deficiency, calcium malabsorption, hypercalciuria, renal insufficiency, etc.). - I discussed with him that we first need to bring his vitamin D level to normal so we can further investigate the parathyroid status. I explained that in the setting of a low vitamin D, the parathyroid hormone can be elevated, which is not a pathologic finding. However, if the PTH is elevated in the setting of a normal vitamin D, we will further need to investigate him for primary or secondary hyperparathyroidism. - after we normalize the vitamin D level, we'll need to check: calcium level intact PTH ([facility]) Magnesium Phosphorus vitamin D- 25 HO and 1,25 HO 24h urinary calcium/creatinine ratio - given instructions for urine collection - We discussed possible consequences of hyperparathyroidism: ~1/3 pts will develop complications over 15 years (OP, nephrolithiasis).  - If the tests indicate a parathyroid adenoma, he agrees with a referral to surgery.   - Criteria for parathyroid surgery are:  .  Increased calcium by more than 1 mg/dL above the upper limit of normal  . Kidney ds.  . Osteoporosis (or vertebral fracture) . Age <9 years old Newer criteria (2013): [name] High UCa >400 mg/d and increased stone risk by biochemical stone risk analysis . Presence of nephrolithiasis or nephrocalcinosis . Pt's preference!  - We may need to check a DXA scan to see if he has osteoporosis (+ add a 33% distal radius for evaluation of cortical bone, which is predominantly affected by hyperparathyroidism).  - I will advise him about vitamin D supplement dose when the results of the vitamin D level are back. - I will see the patient back in 4 months  Orders Placed This Encounter  Procedures  . VITAMIN D 25 Hydroxy (Vit-D Deficiency, Fractures)  . Calcium, urine, 24 hour  . Creatinine, urine, 24 hour   Office Visit on 06/05/2019  Component Date Value Ref Range Status  . VITD 06/05/2019 27.43* 30.00 - 100.00 ng/mL Final   Vitamin D is close to the normal range.  Will change his regimen to 5000 units daily but will also proceed with further investigation for his hyperparathyroidism.  Component     Latest Ref Rng & Units 06/12/2019          Glucose     65 - 99 mg/dL 109 (H)  BUN     7 - 25 mg/dL 13  Creatinine     0.70 - 1.33 mg/dL 0.94  GFR, Est Non African American     > OR = 60 mL/min/1.95m2 89  GFR, Est African American     > OR = 60 mL/min/1.6m2 103  BUN/Creatinine Ratio     6 - 22 (calc) NOT APPLICABLE  Sodium     A999333 - 146 mmol/L 136  Potassium     3.5 - 5.3 mmol/L 4.9  Chloride     98 - 110 mmol/L 105  CO2     20 - 32 mmol/L 22  Calcium     8.6 - 10.3 mg/dL 11.4 (H)  Vitamin D 1, 25 (OH) Total     18 - 72 pg/mL 120 (H)  Vitamin D3 1, 25 (OH)     pg/mL 68  Vitamin D2 1, 25 (OH)     pg/mL 52  PTH, Intact     15 - 65 pg/mL 60  Phosphorus     2.3 - 4.6 mg/dL 2.1 (L)  Magnesium     1.5 - 2.5 mg/dL 2.2  Creatinine, 24H Ur      0.50 - 2.15 g/24 h 1.56  Calcium, 24H Urine     mg/24 h 401 (H)   FECa = 0.02%  High urinary and serum calcium, inappropriately suppressed PTH, low phosphorus, high calcitriol level.  All of these are pointing towards primary hyperparathyroidism.  Will refer to surgery.  Philemon Kingdom, MD PhD [facility] Endocrinology

## 2019-06-05 ENCOUNTER — Encounter: Payer: Self-pay | Admitting: Internal Medicine

## 2019-06-05 ENCOUNTER — Ambulatory Visit (INDEPENDENT_AMBULATORY_CARE_PROVIDER_SITE_OTHER): Payer: 59 | Admitting: Internal Medicine

## 2019-06-05 DIAGNOSIS — E213 Hyperparathyroidism, unspecified: Secondary | ICD-10-CM | POA: Diagnosis not present

## 2019-06-05 DIAGNOSIS — E559 Vitamin D deficiency, unspecified: Secondary | ICD-10-CM

## 2019-06-05 LAB — VITAMIN D 25 HYDROXY (VIT D DEFICIENCY, FRACTURES): VITD: 27.43 ng/mL — ABNORMAL LOW (ref 30.00–100.00)

## 2019-06-05 NOTE — Patient Instructions (Addendum)
Please stop at the lab.  Please continue: - Ergocalciferol 50,000 units weekly - vitamin D3 2000 units 6/7 days  If the vitamin D level is normal, I will let you know to come back for labs.  Patient information (Up-to-Date): Collection of a 24-hour urine specimen  - You should collect every drop of urine during each 24-hour period. It does not matter how much or little urine is passed each time, as long as every drop is collected. - Begin the urine collection in the morning after you wake up, after you have emptied your bladder for the first time. - Urinate (empty the bladder) for the first time and flush it down the toilet. Note the exact time (eg, 6:15 AM). You will begin the urine collection at this time. - Collect every drop of urine during the day and night in an empty collection bottle. Store the bottle at room temperature or in the refrigerator. - If you need to have a bowel movement, any urine passed with the bowel movement should be collected. Try not to include feces with the urine collection. If feces does get mixed in, do not try to remove the feces from the urine collection bottle. - Finish by collecting the first urine passed the next morning, adding it to the collection bottle. This should be within ten minutes before or after the time of the first morning void on the first day (which was flushed). In this example, you would try to void between 6:05 and 6:25 on the second day. - If you need to urinate one hour before the final collection time, drink a full glass of water so that you can void again at the appropriate time. If you have to urinate 20 minutes before, try to hold the urine until the proper time. - Please note the exact time of the final collection, even if it is not the same time as when collection began on day 1. - The bottle(s) may be kept at room temperature for a day or two, but should be kept cool or refrigerated for longer periods of time.  Please come back for a  follow-up appointment in 4 months.   Hypercalcemia Hypercalcemia is when the level of calcium in a person's blood is above normal. The body needs calcium to make bones and keep them strong. Calcium also helps the muscles, nerves, brain, and heart work the way they should. Most of the calcium in the body is in the bones. There is also some calcium in the blood. Hypercalcemia can happen when calcium comes out of the bones, or when the kidneys are not able to remove calcium from the blood. Hypercalcemia can be mild or severe. What are the causes? There are many possible causes of hypercalcemia. Common causes of this condition include:  Hyperparathyroidism. This is a condition in which the body produces too much parathyroid hormone. There are four parathyroid glands in your neck. These glands produce a chemical messenger (hormone) that helps the body absorb calcium from foods and helps your bones release calcium.  Certain kinds of cancer. Less common causes of hypercalcemia include:  Getting too much calcium or vitamin D from your diet.  Kidney failure.  Hyperthyroidism.  Severe dehydration.  Being on bed rest or being inactive for a long time.  Certain medicines.  Infections. What increases the risk? You are more likely to develop this condition if you:  Are male.  Are 80 years of age or older.  Have a family history of  hypercalcemia. What are the signs or symptoms? Mild hypercalcemia that starts slowly may not cause symptoms. Severe, sudden hypercalcemia is more likely to cause symptoms, such as:  Being more thirsty than usual.  Needing to urinate more often than usual.  Abdominal pain.  Nausea and vomiting.  Constipation.  Muscle pain, twitching, or weakness.  Feeling very tired. How is this diagnosed?  Hypercalcemia is usually diagnosed with a blood test. You may also have tests to help determine what is causing this condition, such as imaging tests and more  blood tests. How is this treated? Treatment for hypercalcemia depends on the cause. Treatment may include:  Receiving fluids through an IV.  Medicines that: ? Keep calcium levels steady after receiving fluids (loop diuretics). ? Keep calcium in your bones (bisphosphonates). ? Lower the calcium level in your blood.  Surgery to remove overactive parathyroid glands.  A procedure that filters your blood to correct calcium levels (hemodialysis). Follow these instructions at home:   Take over-the-counter and prescription medicines only as told by your health care provider.  Follow instructions from your health care provider about eating or drinking restrictions.  Drink enough fluid to keep your urine pale yellow.  Stay active. Weight-bearing exercise helps to keep calcium in your bones. Follow instructions from your health care provider about what type and level of exercise is safe for you.  Keep all follow-up visits as told by your health care provider. This is important. Contact a health care provider if you have:  A fever.  A heartbeat that is irregular or very fast.  Changes in mood, memory, or personality. Get help right away if you:  Have severe abdominal pain.  Have chest pain.  Have trouble breathing.  Become very confused and sleepy.  Lose consciousness. Summary  Hypercalcemia is when the level of calcium in a person's blood is above normal. The body needs calcium to make bones and keep them strong. Calcium also helps the muscles, nerves, brain, and heart work the way they should.  There are many possible causes of hypercalcemia, and treatment depends on the cause.  Take over-the-counter and prescription medicines only as told by your health care provider.  Follow instructions from your health care provider about eating or drinking restrictions. This information is not intended to replace advice given to you by your health care provider. Make sure you discuss  any questions you have with your health care provider. Document Revised: 04/15/2018 Document Reviewed: 12/23/2017 Elsevier Patient Education  2020 [employer].

## 2019-06-06 ENCOUNTER — Encounter: Payer: Self-pay | Admitting: Internal Medicine

## 2019-06-08 ENCOUNTER — Telehealth: Payer: Self-pay

## 2019-06-08 MED ORDER — PRALUENT 75 MG/ML ~~LOC~~ SOAJ: 1.0000 "pen " | SUBCUTANEOUS | 5 refills | Status: DC

## 2019-06-08 NOTE — Telephone Encounter (Addendum)
Rx for Praluent 75 mg pen to be injected in to the skin every 14 days sent to the patients pharmacy #2 R-5.  Kate Sable, MD  You 1 hour ago (1:42 PM)   It is okay to dose/start Praluent instead. Thank you

## 2019-06-08 NOTE — Telephone Encounter (Signed)
Update fwd to Dr. [name].

## 2019-06-08 NOTE — Addendum Note (Signed)
Addended by: Lamar Laundry on: 06/08/2019 02:35 PM   Modules accepted: Orders

## 2019-06-08 NOTE — Telephone Encounter (Signed)
Repatha not covered by insurance per [facility]. Praluent is the covered alternative.

## 2019-06-09 ENCOUNTER — Telehealth: Payer: Self-pay

## 2019-06-09 NOTE — Telephone Encounter (Signed)
PA started through [employer] PA has been faxed to the plan as a paper copy. Please contact the plan directly if you haven't received a determination in a typical timeframe.  You will be notified of the determination via fax.

## 2019-06-09 NOTE — Telephone Encounter (Signed)
Incoming fax from covermymeds. Praluent 75MG  is not covered.   Covered formulary Alternatives: Rosuvastatin 10MG  -- No PA Required Ezetimibe -- No PA Required Atorvastatin 20MG  -- No PA Required  Repatha Sureclick - PA REQUIRED Rpatha pushtronex --PA REQUIRED Repatha Syringe -- PA REQUIRED

## 2019-06-09 NOTE — Addendum Note (Signed)
Addended by: Lamar Laundry on: 06/09/2019 08:06 AM   Modules accepted: Orders

## 2019-06-09 NOTE — Telephone Encounter (Addendum)
[facility]ed [facility] spoke with Junie Panning. There has been some confusion regarding the patients PSK9 prescription.  Originally Repatha was sent in. Reponse received was that Repatha is not covered and that Praluent would be covered but required a PA.  The Rx for Praluent was sent in and now the response is that Praluent is not covered and that Repatha would require the PA.  Junie Panning says that on [facility] end that Praluent requires the PA and Repatha is not covered. [employer] [phone]   Me to Janan Ridge, [place]     06/09/19 12:21 PM [place],  Please check to insure that a PA for Praluent has been completed

## 2019-06-09 NOTE — Telephone Encounter (Signed)
[phone] [place] call back number regarding prior auth  Also stated a fax will come through with what is further needed

## 2019-06-09 NOTE — Telephone Encounter (Signed)
PA started through [employer]. Awaiting response.

## 2019-06-10 NOTE — Telephone Encounter (Signed)
Could you take a look at this PA request?

## 2019-06-10 NOTE — Telephone Encounter (Signed)
Thanks for helping with this!

## 2019-06-10 NOTE — Telephone Encounter (Signed)
Pam sent this to me. I see you are working on this. Can you please review and find out what further information is needed for approval? Thanks!

## 2019-06-10 NOTE — Telephone Encounter (Signed)
Incoming fax from [place] stating to please call [phone] to verify / obtain more information.  Called and spoke with Lindy.  Lindy stated that the PA had to go through [place].  Lindy transferred me to Hilda Blades who works for [employer].   Kerby Less PA for patient through Covermymeds with Key: [id number]  PA has been completed awaiting response.   Giuliano Bair  KeyFlonnie Hailstone -  PA Case ID: [id number]

## 2019-06-11 ENCOUNTER — Other Ambulatory Visit: Payer: Self-pay

## 2019-06-11 NOTE — Telephone Encounter (Signed)
Approved today  Your PA request has been approved. Additional information will be provided in the approval communication. (Message [id number])

## 2019-06-12 ENCOUNTER — Other Ambulatory Visit (INDEPENDENT_AMBULATORY_CARE_PROVIDER_SITE_OTHER): Payer: 59

## 2019-06-12 ENCOUNTER — Other Ambulatory Visit: Payer: Self-pay

## 2019-06-12 DIAGNOSIS — E559 Vitamin D deficiency, unspecified: Secondary | ICD-10-CM

## 2019-06-12 LAB — MAGNESIUM: Magnesium: 2.2 mg/dL (ref 1.5–2.5)

## 2019-06-12 LAB — PHOSPHORUS: Phosphorus: 2.1 mg/dL — ABNORMAL LOW (ref 2.3–4.6)

## 2019-06-12 NOTE — Telephone Encounter (Signed)
Fax received from [place] with approval dates for Praluent.   Approval dates: 06/10/2019-03/12-2020

## 2019-06-13 LAB — PARATHYROID HORMONE, INTACT (NO CA): PTH: 60 pg/mL (ref 15–65)

## 2019-06-17 LAB — BASIC METABOLIC PANEL WITH GFR
BUN: 13 mg/dL (ref 7–25)
CO2: 22 mmol/L (ref 20–32)
Calcium: 11.4 mg/dL — ABNORMAL HIGH (ref 8.6–10.3)
Chloride: 105 mmol/L (ref 98–110)
Creat: 0.94 mg/dL (ref 0.70–1.33)
GFR, Est African American: 103 mL/min/{1.73_m2} (ref >=60)
GFR, Est Non African American: 89 mL/min/{1.73_m2} (ref >=60)
Glucose, Bld: 109 mg/dL — ABNORMAL HIGH (ref 65–99)
Potassium: 4.9 mmol/L (ref 3.5–5.3)
Sodium: 136 mmol/L (ref 135–146)

## 2019-06-17 LAB — VITAMIN D 1,25 DIHYDROXY
Vitamin D 1, 25 (OH)2 Total: 120 pg/mL — ABNORMAL HIGH (ref 18–72)
Vitamin D2 1, 25 (OH)2: 52 pg/mL
Vitamin D3 1, 25 (OH)2: 68 pg/mL

## 2019-06-17 LAB — CALCIUM, URINE, 24 HOUR: Calcium, 24H Urine: 401 mg/24 h — ABNORMAL HIGH

## 2019-06-17 LAB — CREATININE, URINE, 24 HOUR: Creatinine, 24H Ur: 1.56 g/(24.h) (ref 0.50–2.15)

## 2019-06-27 ENCOUNTER — Ambulatory Visit: Payer: 59 | Attending: Internal Medicine

## 2019-06-27 DIAGNOSIS — Z23 Encounter for immunization: Secondary | ICD-10-CM

## 2019-06-27 NOTE — Progress Notes (Signed)
   Covid-19 Vaccination Clinic  Name:  Antonio Russell    MRN: [id number] DOB: 09-Oct-1960  06/27/2019  Mr. [name] was observed post Covid-19 immunization for 15 minutes without incident. He was provided with Vaccine Information Sheet and instruction to access the V-Safe system.   Mr. [name] was instructed to call 911 with any severe reactions post vaccine: [name] Difficulty breathing  . Swelling of face and throat  . A fast heartbeat  . A bad rash all over body  . Dizziness and weakness   Immunizations Administered    Name Date Dose VIS Date Route   Moderna COVID-19 Vaccine 06/27/2019  9:14 AM 0.5 mL 03/03/2019 Intramuscular   Manufacturer: Moderna   Lot: [id number]   [id number]

## 2019-07-06 ENCOUNTER — Ambulatory Visit (INDEPENDENT_AMBULATORY_CARE_PROVIDER_SITE_OTHER): Payer: 59

## 2019-07-06 ENCOUNTER — Other Ambulatory Visit: Payer: Self-pay

## 2019-07-06 DIAGNOSIS — I739 Peripheral vascular disease, unspecified: Secondary | ICD-10-CM

## 2019-07-10 ENCOUNTER — Ambulatory Visit (INDEPENDENT_AMBULATORY_CARE_PROVIDER_SITE_OTHER): Payer: 59 | Admitting: Cardiology

## 2019-07-10 ENCOUNTER — Other Ambulatory Visit: Payer: Self-pay

## 2019-07-10 ENCOUNTER — Encounter: Payer: Self-pay | Admitting: Cardiology

## 2019-07-10 ENCOUNTER — Ambulatory Visit: Payer: 59 | Admitting: Cardiology

## 2019-07-10 VITALS — BP 140/90 | HR 70 | Ht 72.0 in | Wt 199.1 lb

## 2019-07-10 DIAGNOSIS — I739 Peripheral vascular disease, unspecified: Secondary | ICD-10-CM

## 2019-07-10 DIAGNOSIS — E7800 Pure hypercholesterolemia, unspecified: Secondary | ICD-10-CM

## 2019-07-10 DIAGNOSIS — I10 Essential (primary) hypertension: Secondary | ICD-10-CM | POA: Diagnosis not present

## 2019-07-10 DIAGNOSIS — F17200 Nicotine dependence, unspecified, uncomplicated: Secondary | ICD-10-CM | POA: Diagnosis not present

## 2019-07-10 MED ORDER — ASPIRIN EC 81 MG PO TBEC: 81.0000 mg | DELAYED_RELEASE_TABLET | Freq: Every day | ORAL | 3 refills | Status: DC

## 2019-07-10 NOTE — Progress Notes (Signed)
Cardiology Office Note:    Date:  07/10/2019   ID:  Antonio Russell, DOB 1960-04-22, MRN [id number]  PCP:  Abner Greenspan, MD  Cardiologist:  Kate Sable, MD  Electrophysiologist:  None   Referring MD: Abner Greenspan, MD   Chief Complaint  Patient presents with  . office visit    Pt has no complaints. Meds verbally reviewed w/ pt.    History of Present Illness:    Antonio Russell is a 59 y.o. male with a hx of hyperlipidemia, current smoker x40+ years, sleep apnea who presents for follow-up.  He was last seen due to hyperlipidemia and buttock pain.  Symptoms were consistent with claudication and ABI, peripheral arterial ultrasound ordered.  Praluent was also started to manage hyperlipidemia since patient is intolerant to statins.  He has been taking the Praluent as prescribed for about a month now.  He has no side effects.  His buttock pain symptoms has stayed about the same.   Past Medical History:  Diagnosis Date  . Allergic rhinitis   . Allergy   . Anxiety   . Arthritis   . Back pain   . Chest pain 2012   cardiac cath - nonobstructive, nl LV fxn, rec aggressive med management  . GERD (gastroesophageal reflux disease)   . Headache(784.0)   . HLD (hyperlipidemia)   . Impotence of organic origin   . Other testicular hypofunction   . Personal history of urinary calculi   . Sleep apnea   . Tobacco use disorder   . Unspecified hearing loss     Past Surgical History:  Procedure Laterality Date  . COLONOSCOPY    . HEMORRHOID SURGERY    . KNEE SURGERY Right 2013  . NASAL SEPTUM SURGERY      Current Medications: Current Meds  Medication Sig  . Alirocumab (PRALUENT) 75 MG/ML SOAJ Inject 1 pen into the skin every 14 (fourteen) days.  . cetirizine (ZYRTEC) 10 MG tablet Take 10 mg by mouth daily.  . fexofenadine (ALLEGRA) 60 MG tablet Take 60 mg by mouth daily.  . fluticasone (FLONASE) 50 MCG/ACT nasal spray SPRAY 2 SPRAYS INTO EACH NOSTRIL EVERY DAY  . meloxicam  (MOBIC) 15 MG tablet TAKE 1 TABLET (15 MG TOTAL) BY MOUTH DAILY AS NEEDED FOR PAIN (JOINT PAIN). WITH FOOD  . metoprolol succinate (TOPROL-XL) 100 MG 24 hr tablet Take 1 tablet (100 mg total) by mouth daily. Take with or immediately following a meal.  . varenicline (CHANTIX STARTING MONTH PAK) 0.5 MG X 11 & 1 MG X 42 tablet TAKE AS DIRECTED ON PACKAGE  . varenicline (CHANTIX) 1 MG tablet Take 1 tablet (1 mg total) by mouth 2 (two) times daily. Start after starter pack  . Vitamin D, Ergocalciferol, (DRISDOL) 1.25 MG (50000 UNIT) CAPS capsule Take 1 capsule (50,000 Units total) by mouth every 7 (seven) days. For 12 weeks     Allergies:   Crestor [rosuvastatin calcium], Lipitor [atorvastatin calcium], Sertraline hcl, Zocor [simvastatin - high dose], and Amlodipine   Social History   Socioeconomic History  . Marital status: Married    Spouse name: Not on file  . Number of children: Not on file  . Years of education: Not on file  . Highest education level: Not on file  Occupational History  . Not on file  Tobacco Use  . Smoking status: Current Every Day Smoker    Packs/day: 0.50    Years: 35.00    Pack years: 17.50  Types: Cigarettes  . Smokeless tobacco: Former Network engineer and Sexual Activity  . Alcohol use: Yes    Alcohol/week: 0.0 standard drinks    Comment: 6 pack 3 times weekly  . Drug use: No  . Sexual activity: Not on file  Other Topics Concern  . Not on file  Social History Narrative   Coaches basketball   Social Determinants of Health   Financial Resource Strain:   . Difficulty of Paying Living Expenses:   Food Insecurity:   . Worried About Charity fundraiser in the Last Year:   . Arboriculturist in the Last Year:   Transportation Needs:   . Film/video editor (Medical):   [name] Lack of Transportation (Non-Medical):   Physical Activity:   . Days of Exercise per Week:   . Minutes of Exercise per Session:   Stress:   . Feeling of Stress :   Social  Connections:   . Frequency of Communication with Friends and Family:   . Frequency of Social Gatherings with Friends and Family:   . Attends Religious Services:   . Active Member of Clubs or Organizations:   . Attends Archivist Meetings:   [name] Marital Status:      Family History: The patient's family history includes Cancer in his father; Diabetes in his mother; Esophageal cancer in his paternal uncle; Heart disease in an other family member; Hyperlipidemia in his father and mother; Hypertension in his father, mother, and sister; Lung cancer in an other family member. There is no history of Colon cancer, Stomach cancer, or Rectal cancer.  ROS:   Please see the history of present illness.     All other systems reviewed and are negative.  EKGs/Labs/Other Studies Reviewed:    The following studies were reviewed today:   EKG:  EKG is  ordered today.  The ekg ordered today demonstrates sinus rhythm, normal ECG.  Recent Labs: 01/13/2019: ALT 27; Hemoglobin 15.7; Platelets 259.0; TSH 1.12 06/12/2019: BUN 13; Creat 0.94; Magnesium 2.2; Potassium 4.9; Sodium 136  Recent Lipid Panel    Component Value Date/Time   CHOL 308 (H) 05/01/2019 0938   TRIG 235.0 (H) 05/01/2019 0938   HDL 52.40 05/01/2019 0938   CHOLHDL 6 05/01/2019 0938   VLDL 47.0 (H) 05/01/2019 0938   LDLCALC 155 (H) 10/26/2016 0826   LDLDIRECT 225.0 05/01/2019 0938    Physical Exam:    VS:  BP 140/90 (BP Location: Left Arm, Patient Position: Sitting, Cuff Size: Normal)   Pulse 70   Ht 6' (1.829 m)   Wt 199 lb 2 oz (90.3 kg)   SpO2 98%   BMI 27.01 kg/m     Wt Readings from Last 3 Encounters:  07/10/19 199 lb 2 oz (90.3 kg)  06/05/19 196 lb (88.9 kg)  05/29/19 201 lb (91.2 kg)     GEN:  Well nourished, well developed in no acute distress HEENT: Normal NECK: No JVD; No carotid bruits LYMPHATICS: No lymphadenopathy CARDIAC: RRR, no murmurs, rubs, gallops RESPIRATORY:  Clear to auscultation without  rales, wheezing or rhonchi  ABDOMEN: Soft, non-tender, non-distended MUSCULOSKELETAL:  No edema; No deformity  SKIN: Warm and dry NEUROLOGIC:  Alert and oriented x 3 PSYCHIATRIC:  Normal affect   ASSESSMENT:    1. Pure hypercholesterolemia   2. PAD (peripheral artery disease) (HCC)   3. Claudication ([place])   4. Essential hypertension   5. Smoking    PLAN:      1.  Patient with history of hyperlipidemia.  His 10-year ASCVD risk score is 23.4%.  He is intolerant to statins.  Cont praluent.  Repeat fasting lipid panel in 3 months. 2. Patient with buttock claudication.  He has risk factors for PAD including hypertension, long smoking history, hyperlipidemia.  Lower extremity arterial ultrasound showed stenosis in the right and left iliac artery segments.  Infra abdominal aortic aneurysm also noted.  Will refer patient to vascular surgery for further management.  Start aspirin 81 mg, praluent for lipid control as mentioned above. 3. History of hypertension.  Blood pressure is adequately controlled.  Continue beta-blocker as prescribed. 4. History of smoking.  Smoking cessation advised.  Patient is working on smoking cessation especially with known PAD.  Follow-up in 3 months after lipid panel.  This note was generated in part or whole with voice recognition software. Voice recognition is usually quite accurate but there are transcription errors that can and very often do occur. I apologize for any typographical errors that were not detected and corrected.  Medication Adjustments/Labs and Tests Ordered: Current medicines are reviewed at length with the patient today.  Concerns regarding medicines are outlined above.  Orders Placed This Encounter  Procedures  . Lipid panel  . Ambulatory referral to Vascular Surgery  . EKG 12-Lead   Meds ordered this encounter  Medications  . aspirin EC 81 MG tablet    Sig: Take 1 tablet (81 mg total) by mouth daily.    Dispense:  90 tablet    Refill:   3    Patient Instructions  Medication Instructions:  Your physician has recommended you make the following change in your medication:  1- START Aspirin 81 mg by mouth once a day.  *If you need a refill on your cardiac medications before your next appointment, please call your pharmacy*  Lab Work: Your physician recommends that you return for lab work in: 3 months ~ October 09, 2019 at the [facility]. - check Lipid panel. - Please go to the [facility]. You will check in at the front desk to the right as you walk into the atrium. Valet Parking is offered if needed. - No appointment needed. You may go any day between 7 am and 6 pm. - You will need to be fasting. Please do not have anything to eat or drink after midnight the morning you have the lab work. You may only have water or black coffee with no cream or sugar.  If you have labs (blood work) drawn today and your tests are completely normal, you will receive your results only by: [name] MyChart Message (if you have MyChart) OR . A paper copy in the mail If you have any lab test that is abnormal or we need to change your treatment, we will call you to review the results.  Testing/Procedures: none  Follow-Up: You have been referred to Vascular Surgery at [place] [facility].  Someone will call you to schedule and if you do not receive a call, you may call the number under the referral on the next page.  At [facility], you and your health needs are our priority.  As part of our continuing mission to provide you with exceptional heart care, we have created designated Provider Care Teams.  These Care Teams include your primary Cardiologist (physician) and Advanced Practice Providers (APPs -  Physician Assistants and Nurse Practitioners) who all work together to provide you with the care you need, when you  need it.  We recommend signing up for the patient portal called "MyChart".  Sign up information is provided on  this After Visit Summary.  MyChart is used to connect with patients for Virtual Visits (Telemedicine).  Patients are able to view lab/test results, encounter notes, upcoming appointments, etc.  Non-urgent messages can be sent to your provider as well.   To learn more about what you can do with MyChart, go to [email or web address].    Your next appointment:   3 month(s)  The format for your next appointment:   In Person  Provider:   Kate Sable, MD       Signed, Kate Sable, MD  07/10/2019 1:02 PM    [place]

## 2019-07-10 NOTE — Patient Instructions (Signed)
Medication Instructions:  Your physician has recommended you make the following change in your medication:  1- START Aspirin 81 mg by mouth once a day.  *If you need a refill on your cardiac medications before your next appointment, please call your pharmacy*  Lab Work: Your physician recommends that you return for lab work in: 3 months ~ October 09, 2019 at the [facility]. - check Lipid panel. - Please go to the [facility]. You will check in at the front desk to the right as you walk into the atrium. Valet Parking is offered if needed. - No appointment needed. You may go any day between 7 am and 6 pm. - You will need to be fasting. Please do not have anything to eat or drink after midnight the morning you have the lab work. You may only have water or black coffee with no cream or sugar.  If you have labs (blood work) drawn today and your tests are completely normal, you will receive your results only by: [name] MyChart Message (if you have MyChart) OR . A paper copy in the mail If you have any lab test that is abnormal or we need to change your treatment, we will call you to review the results.  Testing/Procedures: none  Follow-Up: You have been referred to Vascular Surgery at [place] and [facility].  Someone will call you to schedule and if you do not receive a call, you may call the number under the referral on the next page.  At [facility], you and your health needs are our priority.  As part of our continuing mission to provide you with exceptional heart care, we have created designated Provider Care Teams.  These Care Teams include your primary Cardiologist (physician) and Advanced Practice Providers (APPs -  Physician Assistants and Nurse Practitioners) who all work together to provide you with the care you need, when you need it.  We recommend signing up for the patient portal called "MyChart".  Sign up information is provided on this After Visit Summary.   MyChart is used to connect with patients for Virtual Visits (Telemedicine).  Patients are able to view lab/test results, encounter notes, upcoming appointments, etc.  Non-urgent messages can be sent to your provider as well.   To learn more about what you can do with MyChart, go to [email or web address].    Your next appointment:   3 month(s)  The format for your next appointment:   In Person  Provider:   Kate Sable, MD

## 2019-07-13 ENCOUNTER — Other Ambulatory Visit (INDEPENDENT_AMBULATORY_CARE_PROVIDER_SITE_OTHER): Payer: Self-pay | Admitting: Vascular Surgery

## 2019-07-13 DIAGNOSIS — I739 Peripheral vascular disease, unspecified: Secondary | ICD-10-CM

## 2019-07-14 ENCOUNTER — Ambulatory Visit (INDEPENDENT_AMBULATORY_CARE_PROVIDER_SITE_OTHER): Payer: 59 | Admitting: Vascular Surgery

## 2019-07-14 ENCOUNTER — Other Ambulatory Visit: Payer: Self-pay

## 2019-07-14 ENCOUNTER — Encounter (INDEPENDENT_AMBULATORY_CARE_PROVIDER_SITE_OTHER): Payer: Self-pay | Admitting: Vascular Surgery

## 2019-07-14 ENCOUNTER — Ambulatory Visit (INDEPENDENT_AMBULATORY_CARE_PROVIDER_SITE_OTHER): Payer: 59

## 2019-07-14 ENCOUNTER — Encounter (INDEPENDENT_AMBULATORY_CARE_PROVIDER_SITE_OTHER): Payer: Self-pay

## 2019-07-14 VITALS — BP 129/83 | HR 65 | Ht 72.0 in | Wt 195.0 lb

## 2019-07-14 DIAGNOSIS — I10 Essential (primary) hypertension: Secondary | ICD-10-CM

## 2019-07-14 DIAGNOSIS — F17200 Nicotine dependence, unspecified, uncomplicated: Secondary | ICD-10-CM

## 2019-07-14 DIAGNOSIS — I714 Abdominal aortic aneurysm, without rupture, unspecified: Secondary | ICD-10-CM

## 2019-07-14 DIAGNOSIS — E7800 Pure hypercholesterolemia, unspecified: Secondary | ICD-10-CM | POA: Diagnosis not present

## 2019-07-14 DIAGNOSIS — I713 Abdominal aortic aneurysm, ruptured, unspecified: Secondary | ICD-10-CM

## 2019-07-14 DIAGNOSIS — I70219 Atherosclerosis of native arteries of extremities with intermittent claudication, unspecified extremity: Secondary | ICD-10-CM | POA: Insufficient documentation

## 2019-07-14 DIAGNOSIS — I70213 Atherosclerosis of native arteries of extremities with intermittent claudication, bilateral legs: Secondary | ICD-10-CM

## 2019-07-14 NOTE — Assessment & Plan Note (Signed)
He had had assessment of his perfusion almost a decade ago but astutely, his cardiologist ordered noninvasive studies at their office which I have been able to review independently.  He had an aortoiliac duplex and ABIs.  His aortoiliac duplex showed an abdominal aortic aneurysm measuring 3.9 cm in maximal diameter.  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  Although his ABIs were preserved, the study was done at rest.  Given these findings, he was referred for further evaluation and management.   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  We discussed that we could potentially treat his iliac occlusive disease separately and then monitor his aneurysm going forward, particularly if the aneurysm in the distal portion of the aorta is the most significant part, raising the aortic bifurcation with kissing iliac stents may be problematic for subsequent aneurysm repair in the future and may also be concerning for patency due to turbulence from the aortic aneurysm.  We discussed that treatment of the abdominal aortic aneurysm and the iliac artery occlusive disease concomitantly is certainly a reasonable option, and particularly for a younger healthier patient this may be the ideal treatment option.  This is the option of the patient would prefer by far.  We have discussed endovascular abdominal aortic aneurysm repair and how it is performed.  We have discussed that generally, we can do this in a percutaneous manner and expect a 1 night hospital stay.  Additional iliac interventions are almost certainly going to be required particularly on the left side where the disease appears to be in the external iliac artery predominantly.  Prior to an abdominal aortic aneurysm repair, he should have a CT angiogram of the abdomen pelvis and this will help our preoperative planning significantly.   This will be ordered and his abdominal aortic aneurysm repair and concomitant iliac intervention will be planned in the near future at his convenience.

## 2019-07-14 NOTE — Progress Notes (Signed)
Patient ID: Antonio Russell, male   DOB: 17-Oct-1960, 59 y.o.   MRN: [id number]  Chief Complaint  Patient presents with   New Patient (Initial Visit)    Claudication    HPI Antonio Russell is a 59 y.o. male.  I am asked to see the patient by Dr. [name] for evaluation of claudication.  The patient states that 3 to 4 years ago, he could walk 20 miles.  Now he can only walk about 100 yards at the most before having to absolutely stop and rest.  Both legs are affected at this point.  He says initially, it was more the left leg and then it started affecting the right leg.  This affects his hip and buttocks and radiates down his thighs.  Once he rests, the pain goes away.  He does have multiple atherosclerotic risk factors including ongoing tobacco use and hyperlipidemia.  He is also being evaluated for hypercalcemia.  He had had assessment of his perfusion almost a decade ago but astutely, his cardiologist ordered noninvasive studies at their office which I have been able to review independently.  He had an aortoiliac duplex and ABIs.  His aortoiliac duplex showed an abdominal aortic aneurysm measuring 3.9 cm in maximal diameter.  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  Although his ABIs were preserved, the study was done at rest.  Given these findings, he was referred for further evaluation and management.     Past Medical History:  Diagnosis Date   Allergic rhinitis    Allergy    Anxiety    Arthritis    Back pain    Chest pain 2012   cardiac cath - nonobstructive, nl LV fxn, rec aggressive med management   GERD (gastroesophageal reflux disease)    Headache(784.0)    HLD (hyperlipidemia)    Impotence of organic origin    Other testicular hypofunction    Personal history of urinary calculi    Sleep apnea    Tobacco use disorder    Unspecified hearing loss     Past Surgical History:    Procedure Laterality Date   COLONOSCOPY     HEMORRHOID SURGERY     KNEE SURGERY Right 2013   NASAL SEPTUM SURGERY       Family History  Problem Relation Age of Onset   Hypertension Mother    Hyperlipidemia Mother    Diabetes Mother    Hypertension Father    Hyperlipidemia Father    Cancer Father        throat   Hypertension Sister    Lung cancer Other        GF   Heart disease Other        GM   Esophageal cancer Paternal Uncle    Colon cancer Neg Hx    Stomach cancer Neg Hx    Rectal cancer Neg Hx       Social History   Tobacco Use   Smoking status: Current Every Day Smoker    Packs/day: 0.50    Years: 35.00    Pack years: 17.50    Types: Cigarettes   Smokeless tobacco: Former Systems developer  Substance Use Topics   Alcohol use: Yes    Alcohol/week: 0.0 standard drinks    Comment: 6 pack 3 times weekly   Drug use: No     Allergies  Allergen Reactions  Crestor [Rosuvastatin Calcium] Other (See Comments)    Muscle aches   Lipitor [Atorvastatin Calcium] Other (See Comments)    Arm soreness    Sertraline Hcl Other (See Comments)    REACTION: muscle twitches   Zocor [Simvastatin - High Dose] Other (See Comments)    Muscle pain    Amlodipine     Sexual side effects    Current Outpatient Medications  Medication Sig Dispense Refill   Alirocumab (PRALUENT) 75 MG/ML SOAJ Inject 1 pen into the skin every 14 (fourteen) days. 2 pen 5   aspirin EC 81 MG tablet Take 1 tablet (81 mg total) by mouth daily. 90 tablet 3   fexofenadine (ALLEGRA) 60 MG tablet Take 60 mg by mouth daily.     fluticasone (FLONASE) 50 MCG/ACT nasal spray SPRAY 2 SPRAYS INTO EACH NOSTRIL EVERY DAY 16 g 5   meloxicam (MOBIC) 15 MG tablet TAKE 1 TABLET (15 MG TOTAL) BY MOUTH DAILY AS NEEDED FOR PAIN (JOINT PAIN). WITH FOOD 30 tablet 3   metoprolol succinate (TOPROL-XL) 100 MG 24 hr tablet Take 1 tablet (100 mg total) by mouth daily. Take with or immediately following a  meal. 30 tablet 11   varenicline (CHANTIX STARTING MONTH PAK) 0.5 MG X 11 & 1 MG X 42 tablet TAKE AS DIRECTED ON PACKAGE 53 each 0   varenicline (CHANTIX) 1 MG tablet Take 1 tablet (1 mg total) by mouth 2 (two) times daily. Start after starter pack 60 tablet 3   Vitamin D, Ergocalciferol, (DRISDOL) 1.25 MG (50000 UNIT) CAPS capsule Take 1 capsule (50,000 Units total) by mouth every 7 (seven) days. For 12 weeks 12 capsule 0   cetirizine (ZYRTEC) 10 MG tablet Take 10 mg by mouth daily.     No current facility-administered medications for this visit.      REVIEW OF SYSTEMS (Negative unless checked)  Constitutional: [] Weight loss  [] Fever  [] Chills Cardiac: [] Chest pain   [] Chest pressure   [] Palpitations   [] Shortness of breath when laying flat   [] Shortness of breath at rest   [] Shortness of breath with exertion. Vascular:  [x] Pain in legs with walking   [] Pain in legs at rest   [] Pain in legs when laying flat   [x] Claudication   [] Pain in feet when walking  [] Pain in feet at rest  [] Pain in feet when laying flat   [] History of DVT   [] Phlebitis   [] Swelling in legs   [] Varicose veins   [] Non-healing ulcers Pulmonary:   [] Uses home oxygen   [] Productive cough   [] Hemoptysis   [] Wheeze  [] COPD   [] Asthma Neurologic:  [] Dizziness  [] Blackouts   [] Seizures   [] History of stroke   [] History of TIA  [] Aphasia   [] Temporary blindness   [] Dysphagia   [] Weakness or numbness in arms   [] Weakness or numbness in legs Musculoskeletal:  [x] Arthritis   [] Joint swelling   [x] Joint pain   [] Low back pain Hematologic:  [] Easy bruising  [] Easy bleeding   [] Hypercoagulable state   [] Anemic  [] Hepatitis Gastrointestinal:  [] Blood in stool   [] Vomiting blood  [x] Gastroesophageal reflux/heartburn   [] Abdominal pain Genitourinary:  [] Chronic kidney disease   [] Difficult urination  [] Frequent urination  [] Burning with urination   [] Hematuria Skin:  [] Rashes   [] Ulcers   [] Wounds Psychological:  [x] History of  anxiety   []  History of major depression.    Physical Exam BP 129/83    Pulse 65    Ht 6' (1.829 m)  Wt 195 lb (88.5 kg)    BMI 26.45 kg/m  Gen:  WD/WN, NAD Head: [place]/AT, No temporalis wasting.  Ear/Nose/Throat: Hearing grossly intact, nares w/o erythema or drainage, oropharynx w/o Erythema/Exudate Eyes: Conjunctiva clear, sclera non-icteric  Neck: trachea midline.  No JVD.  Pulmonary:  Good air movement, respirations not labored, no use of accessory muscles  Cardiac: RRR, no JVD Vascular:  Vessel Right Left  Radial Palpable Palpable                          DP  palpable  palpable  PT  palpable  palpable   Gastrointestinal:. No masses, surgical incisions, or scars. Musculoskeletal: M/S 5/5 throughout.  Extremities without ischemic changes.  No deformity or atrophy.  No significant lower extremity edema. Neurologic: Sensation grossly intact in extremities.  Symmetrical.  Speech is fluent. Motor exam as listed above. Psychiatric: Judgment intact, Mood & affect appropriate for pt's clinical situation. Dermatologic: No rashes or ulcers noted.  No cellulitis or open wounds.    Radiology VAS [place] ABI WITH/WO TBI  Result Date: 07/07/2019 LOWER EXTREMITY DOPPLER STUDY Indications: Claudication. High Risk Factors: Hypertension, hyperlipidemia, current smoker, coronary artery                    disease. Other Factors: Bilateral buttock claudication walking 100-150 yards on flat                ground, relieved with rest, and worse on an incline.                 SEE LEA DUPLEX REPORT.  Comparison Study: CT Abdomen in 11/2014 showed mild atherosclerotic plaques and                   calcifications are noted distal abdominal aorta and iliac                   arteries. Performing Technologist: [employer] BS, RVT, RDCS  Examination Guidelines: A complete evaluation includes at minimum, Doppler waveform signals and systolic blood pressure reading at the level of bilateral brachial, anterior  tibial, and posterior tibial arteries, when vessel segments are accessible. Bilateral testing is considered an integral part of a complete examination. Photoelectric Plethysmograph (PPG) waveforms and toe systolic pressure readings are included as required and additional duplex testing as needed. Limited examinations for reoccurring indications may be performed as noted.  ABI Findings: +---------+------------------+-----+--------+--------+  Right     Rt Pressure (mmHg) Index Waveform Comment   +---------+------------------+-----+--------+--------+  Brachial  105                                         +---------+------------------+-----+--------+--------+  ATA       103                0.94  biphasic           +---------+------------------+-----+--------+--------+  PTA       137                1.25  biphasic           +---------+------------------+-----+--------+--------+  PERO      117                1.06  biphasic           +---------+------------------+-----+--------+--------+  Great Toe  118                1.07  Normal             +---------+------------------+-----+--------+--------+ +---------+------------------+-----+--------+-------+  Left      Lt Pressure (mmHg) Index Waveform Comment  +---------+------------------+-----+--------+-------+  Brachial  110                                        +---------+------------------+-----+--------+-------+  ATA       120                1.09  biphasic          +---------+------------------+-----+--------+-------+  PTA       133                1.21  biphasic          +---------+------------------+-----+--------+-------+  PERO      107                0.97  biphasic          +---------+------------------+-----+--------+-------+  Great Toe 117                1.06  Normal            +---------+------------------+-----+--------+-------+ +-------+-----------+-----------+------------+------------+  ABI/TBI Today's ABI Today's TBI Previous ABI Previous TBI   +-------+-----------+-----------+------------+------------+  Right   1.25        1.07        1.2          1.0           +-------+-----------+-----------+------------+------------+  Left    1.21        1.07        1.2          1.1           +-------+-----------+-----------+------------+------------+ Bilateral ABIs and TBIs appear essentially unchanged compared to prior study on 02/08/2011.  Summary: Right: Resting right ankle-brachial index is within normal range. No evidence of significant right lower extremity arterial disease. The right toe-brachial index is normal. Left: Resting left ankle-brachial index is within normal range. No evidence of significant left lower extremity arterial disease. The left toe-brachial index is normal.  *See table(s) above for measurements and observations.  Electronically signed by Carlyle Dolly MD on 07/07/2019 at 4:35:43 PM.    Final    VAS [place] LOWER EXTREMITY ARTERIAL DUPLEX  Result Date: 07/07/2019 LOWER EXTREMITY ARTERIAL DUPLEX STUDY Indications: Claudication. High Risk Factors: Hypertension, hyperlipidemia, current smoker, coronary artery                    disease. Other Factors: Bilateral buttock claudication walking 100-150 yards on flat                ground, relieved with rest, and worse on an incline.                 SEE ABI REPORT.  Current ABI: Bilateral ABI's and toe-brachial indices are normal. Right ABI is              1.25, left is 1.21. Comparison Study: 02/08/2011 Arterial Doppler was normal.                   CT Abdomen in 11/2014 showed mild atherosclerotic plaques and  calcifications are noted distal abdominal aorta and iliac                   arteries. Performing Technologist: [employer] BS, RVT, RDCS  Examination Guidelines: A complete evaluation includes B-mode imaging, spectral Doppler, color Doppler, and power Doppler as needed of all accessible portions of each vessel. Bilateral testing is considered an integral part of a complete  examination. Limited examinations for reoccurring indications may be performed as noted. Aorta: +--------+-------+----------+----------+--------+--------+--------+           AP (cm) Trans (cm) PSV (cm/s) Waveform Thrombus Shape     +--------+-------+----------+----------+--------+--------+--------+  Proximal         2.08       74         biphasic                    +--------+-------+----------+----------+--------+--------+--------+  Mid      2.57    3.33                  biphasic Present            +--------+-------+----------+----------+--------+--------+--------+  Distal   3.86    3.75       45         biphasic Present  fusiform  +--------+-------+----------+----------+--------+--------+--------+ New finding of infrarenal fusiform AAA.  +----------+--------+-----+---------------+----------+-------------------+  RIGHT      PSV cm/s Ratio Stenosis        Waveform   Comments             +----------+--------+-----+---------------+----------+-------------------+  CIA Prox   452            75-99% stenosis triphasic  1.1 cm x 1.3 cm      +----------+--------+-----+---------------+----------+-------------------+  CIA Mid    196                            monophasic                      +----------+--------+-----+---------------+----------+-------------------+  CIA Distal 176                            monophasic                      +----------+--------+-----+---------------+----------+-------------------+  EIA Prox   127                            monophasic                      +----------+--------+-----+---------------+----------+-------------------+  EIA Mid    198                            biphasic                        +----------+--------+-----+---------------+----------+-------------------+  EIA Distal 125                            biphasic                        +----------+--------+-----+---------------+----------+-------------------+  CFA Prox   122  triphasic                        +----------+--------+-----+---------------+----------+-------------------+  CFA Distal 77                             biphasic   smooth plaque noted  +----------+--------+-----+---------------+----------+-------------------+  DFA        89                             triphasic                       +----------+--------+-----+---------------+----------+-------------------+  SFA Prox   95                             biphasic   smooth plaque        +----------+--------+-----+---------------+----------+-------------------+  SFA Mid    71                             triphasic                       +----------+--------+-----+---------------+----------+-------------------+  SFA Distal 92                             biphasic                        +----------+--------+-----+---------------+----------+-------------------+  POP Prox   47                             biphasic                        +----------+--------+-----+---------------+----------+-------------------+  POP Distal 44                             biphasic                        +----------+--------+-----+---------------+----------+-------------------+  TP Trunk   59                             triphasic                       +----------+--------+-----+---------------+----------+-------------------+  ATA Prox   45                             biphasic                        +----------+--------+-----+---------------+----------+-------------------+  PTA Mid    46                             biphasic                        +----------+--------+-----+---------------+----------+-------------------+  PERO Mid   19  biphasic                        +----------+--------+-----+---------------+----------+-------------------+  +----------+--------+-----+---------------+----------+-------------------+  LEFT       PSV cm/s Ratio Stenosis        Waveform   Comments              +----------+--------+-----+---------------+----------+-------------------+  CIA Prox   112                            biphasic   1.0 cm x 1.1 cm      +----------+--------+-----+---------------+----------+-------------------+  CIA Mid    131                            biphasic                        +----------+--------+-----+---------------+----------+-------------------+  CIA Distal 194                            monophasic                      +----------+--------+-----+---------------+----------+-------------------+  EIA Prox   409            75-99% stenosis triphasic                       +----------+--------+-----+---------------+----------+-------------------+  EIA Mid    125                            biphasic                        +----------+--------+-----+---------------+----------+-------------------+  EIA Distal 111                            triphasic                       +----------+--------+-----+---------------+----------+-------------------+  CFA Prox   103                            biphasic                        +----------+--------+-----+---------------+----------+-------------------+  CFA Distal 110                            triphasic  smooth plaque noted  +----------+--------+-----+---------------+----------+-------------------+  DFA        125                            triphasic                       +----------+--------+-----+---------------+----------+-------------------+  SFA Prox   120                            biphasic   smooth plaque        +----------+--------+-----+---------------+----------+-------------------+  SFA Mid    86  biphasic                        +----------+--------+-----+---------------+----------+-------------------+  SFA Distal 83                             biphasic                        +----------+--------+-----+---------------+----------+-------------------+  POP Prox   51                             biphasic                         +----------+--------+-----+---------------+----------+-------------------+  POP Distal 41                             biphasic   smooth plaque        +----------+--------+-----+---------------+----------+-------------------+  TP Trunk                                  triphasic                       +----------+--------+-----+---------------+----------+-------------------+  ATA Mid    40                             biphasic                        +----------+--------+-----+---------------+----------+-------------------+  PTA Mid    46                             biphasic                        +----------+--------+-----+---------------+----------+-------------------+  PERO Prox  75                             biphasic                        +----------+--------+-----+---------------+----------+-------------------+   Summary: Right: 75-99% stenosis noted in the iliac segment. New finding of an infrarenal fusiform AAA. No significant infrainguinal stenosis seen. Left: 75-99% stenosis noted in the iliac segment. No significant infrainguinal stenosis seen.  See table(s) above for measurements and observations. Vascular consult recommended. Electronically signed by Carlyle Dolly MD on 07/07/2019 at 4:47:05 PM.    Final     Labs Recent Results (from the past 2160 hour(s))  Lipid panel     Status: Abnormal   Collection Time: 05/01/19  9:38 AM  Result Value Ref Range   Cholesterol 308 (H) 0 - 200 mg/dL    Comment: ATP III Classification       Desirable:  < 200 mg/dL               Borderline High:  200 - 239 mg/dL          High:  > = 240 mg/dL   Triglycerides 235.0 (H) 0.0 - 149.0 mg/dL    Comment: Normal:  <150  mg/dLBorderline High:  150 - 199 mg/dL   HDL 52.40 >39.00 mg/dL   VLDL 47.0 (H) 0.0 - 40.0 mg/dL   Total CHOL/HDL Ratio 6     Comment:                Men          Women1/2 Average Risk     3.4          3.3Average Risk          5.0          4.42X Average Risk          9.6          7.13X Average Risk           15.0          11.0                       NonHDL 255.26     Comment: NOTE:  Non-HDL goal should be 30 mg/dL higher than patient's LDL goal (i.e. LDL goal of < 70 mg/dL, would have non-HDL goal of < 100 mg/dL)  Hemoglobin A1c     Status: None   Collection Time: 05/01/19  9:38 AM  Result Value Ref Range   Hgb A1c MFr Bld 6.1 4.6 - 6.5 %    Comment: Glycemic Control Guidelines for People with Diabetes:Non Diabetic:  <6%Goal of Therapy: <7%Additional Action Suggested:  >8%   ANA     Status: None   Collection Time: 05/01/19  9:38 AM  Result Value Ref Range   Anti Nuclear Antibody (ANA) NEGATIVE NEGATIVE    Comment: ANA IFA is a first line screen for detecting the presence of up to approximately 150 autoantibodies in various autoimmune diseases. A negative ANA IFA result suggests an ANA-associated autoimmune disease is not present at this time, but is not definitive. If there is high clinical suspicion for Sjogren's syndrome, testing for anti-SS-A/Ro antibody should be considered. Anti-Jo-1 antibody should be considered for clinically suspected inflammatory myopathies. . AC-0: Negative . International Consensus on ANA Patterns (https://www.hernandez-brewer.com/) . For additional information, please refer to http://education.[facility].com/faq/FAQ177 (This link is being provided for informational/ educational purposes only.) .   Rheumatoid factor     Status: None   Collection Time: 05/01/19  9:38 AM  Result Value Ref Range   Rhuematoid fact SerPl-aCnc <14 <14 IU/mL  Sedimentation Rate     Status: None   Collection Time: 05/01/19  9:38 AM  Result Value Ref Range   Sed Rate 20 0 - 20 mm/hr  PTH, Intact and Calcium     Status: Abnormal   Collection Time: 05/01/19  9:38 AM  Result Value Ref Range   PTH 133 (H) 14 - 64 pg/mL    Comment: . Interpretive Guide    Intact PTH           Calcium ------------------    ----------           ------- Normal Parathyroid    Normal                Normal Hypoparathyroidism    Low or Low Normal    Low Hyperparathyroidism    Primary            Normal or High       High    Secondary          High  Normal or Low    Tertiary           High                 High Non-Parathyroid    Hypercalcemia      Low or Low Normal    High .    Calcium 11.9 (H) 8.6 - 10.3 mg/dL  VITAMIN D 25 Hydroxy (Vit-D Deficiency, Fractures)     Status: Abnormal   Collection Time: 05/01/19  9:38 AM  Result Value Ref Range   VITD 19.52 (L) 30.00 - 100.00 ng/mL  Phosphorus     Status: None   Collection Time: 05/01/19  9:38 AM  Result Value Ref Range   Phosphorus 3.0 2.3 - 4.6 mg/dL  LDL cholesterol, direct     Status: None   Collection Time: 05/01/19  9:38 AM  Result Value Ref Range   Direct LDL 225.0 mg/dL    Comment: Optimal:  <100 mg/dLNear or Above Optimal:  100-129 mg/dLBorderline High:  130-159 mg/dLHigh:  160-189 mg/dLVery High:  >190 mg/dL  VITAMIN D 25 Hydroxy (Vit-D Deficiency, Fractures)     Status: Abnormal   Collection Time: 06/05/19  1:58 PM  Result Value Ref Range   VITD 27.43 (L) 30.00 - 100.00 ng/mL  Parathyroid hormone, intact (no Ca)     Status: None   Collection Time: 06/12/19  8:52 AM  Result Value Ref Range   PTH 60 15 - 65 pg/mL  Vitamin D 1,25 dihydroxy     Status: Abnormal   Collection Time: 06/12/19  8:52 AM  Result Value Ref Range   Vitamin D 1, 25 (OH)2 Total 120 (H) 18 - 72 pg/mL   Vitamin D3 1, 25 (OH)2 68 pg/mL   Vitamin D2 1, 25 (OH)2 52 pg/mL    Comment: [name] Vitamin D3, 1,25(OH)2 indicates both endogenous production and supplementation. Vitamin D2, 1,25(OH)2 is an indicator of exogenous sources, such as diet or supplementation.  Interpretation and therapy are based on measurement of Vitamin D,1,25(OH)2, Total. . . This test was developed and its analytical performance characteristics have been determined by [facility], [place], [place]. It has not been cleared or  approved by the FDA. This assay has been validated pursuant to the CLIA regulations and is used for clinical purposes. .   Phosphorus     Status: Abnormal   Collection Time: 06/12/19  8:52 AM  Result Value Ref Range   Phosphorus 2.1 (L) 2.3 - 4.6 mg/dL  Magnesium     Status: None   Collection Time: 06/12/19  8:52 AM  Result Value Ref Range   Magnesium 2.2 1.5 - 2.5 mg/dL  BASIC METABOLIC PANEL WITH GFR     Status: Abnormal   Collection Time: 06/12/19  8:52 AM  Result Value Ref Range   Glucose, Bld 109 (H) 65 - 99 mg/dL    Comment: .            Fasting reference interval . For someone without known diabetes, a glucose value between 100 and 125 mg/dL is consistent with prediabetes and should be confirmed with a follow-up test. .    BUN 13 7 - 25 mg/dL   Creat 0.94 0.70 - 1.33 mg/dL    Comment: For patients >1 years of age, the reference limit for Creatinine is approximately 13% higher for people identified as African-American. .    GFR, Est Non African American 89 > OR = 60 mL/min/1.7m2   GFR, Est African American  103 > OR = 60 mL/min/1.43m2   BUN/Creatinine Ratio NOT APPLICABLE 6 - 22 (calc)   Sodium 136 135 - 146 mmol/L   Potassium 4.9 3.5 - 5.3 mmol/L   Chloride 105 98 - 110 mmol/L   CO2 22 20 - 32 mmol/L   Calcium 11.4 (H) 8.6 - 10.3 mg/dL  Creatinine, urine, 24 hour     Status: None   Collection Time: 06/12/19  8:52 AM  Result Value Ref Range   Creatinine, 24H Ur 1.56 0.50 - 2.15 g/24 h  Calcium, urine, 24 hour     Status: Abnormal   Collection Time: 06/12/19  8:52 AM  Result Value Ref Range   Calcium, 24H Urine 401 (H) mg/24 h    Comment:                           Reference Range  55-300                            Low calcium diet 55-200     Assessment/Plan:  TOBACCO USE We had a discussion for approximately 3 minutes regarding the absolute need for smoking cessation due to the deleterious nature of tobacco on the vascular system. We discussed the  tobacco use would diminish patency of any intervention, and likely significantly worsen progression of disease. We discussed multiple agents for quitting including replacement therapy or medications to reduce cravings such as Chantix which she has recently started. The patient voices their understanding of the importance of smoking cessation.   Hypercalcemia Scheduled to have an endocrinology evaluation in the near future.  Hyperlipidemia lipid control important in reducing the progression of atherosclerotic disease.  Not currently on a statin, but if we do perform intervention, statin therapy would be used postoperatively.   Essential hypertension blood pressure control important in reducing the progression of atherosclerotic disease. On appropriate oral medications.   AAA (abdominal aortic aneurysm) without rupture (Bruno) He had had assessment of his perfusion almost a decade ago but astutely, his cardiologist ordered noninvasive studies at their office which I have been able to review independently.  He had an aortoiliac duplex and ABIs.  His aortoiliac duplex showed an abdominal aortic aneurysm measuring 3.9 cm in maximal diameter.  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  Although his ABIs were preserved, the study was done at rest.  Given these findings, he was referred for further evaluation and management.   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  We discussed that we could potentially treat his iliac occlusive disease separately and then monitor his aneurysm going forward, particularly if the aneurysm in the distal portion of the aorta is the most significant part, raising the aortic bifurcation with kissing iliac stents may be problematic for subsequent aneurysm repair in the future and may also be concerning for patency due to turbulence from the  aortic aneurysm.  We discussed that treatment of the abdominal aortic aneurysm and the iliac artery occlusive disease concomitantly is certainly a reasonable option, and particularly for a younger healthier patient this may be the ideal treatment option.  This is the option of the patient would prefer by far.  We have discussed endovascular abdominal aortic aneurysm repair and how it is performed.  We have discussed that  generally, we can do this in a percutaneous manner and expect a 1 night hospital stay.  Additional iliac interventions are almost certainly going to be required particularly on the left side where the disease appears to be in the external iliac artery predominantly.  Prior to an abdominal aortic aneurysm repair, he should have a CT angiogram of the abdomen pelvis and this will help our preoperative planning significantly.  This will be ordered and his abdominal aortic aneurysm repair and concomitant iliac intervention will be planned in the near future at his convenience.  Atherosclerosis of native arteries of extremity with intermittent claudication ([place]) He had had assessment of his perfusion almost a decade ago but astutely, his cardiologist ordered noninvasive studies at their office which I have been able to review independently.  He had an aortoiliac duplex and ABIs.  His aortoiliac duplex showed an abdominal aortic aneurysm measuring 3.9 cm in maximal diameter.  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  Although his ABIs were preserved, the study was done at rest.  Given these findings, he was referred for further evaluation and management.   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  We discussed that we could potentially treat his iliac occlusive disease separately and then monitor his aneurysm going forward, particularly if the aneurysm in the  distal portion of the aorta is the most significant part, raising the aortic bifurcation with kissing iliac stents may be problematic for subsequent aneurysm repair in the future and may also be concerning for patency due to turbulence from the aortic aneurysm.  We discussed that treatment of the abdominal aortic aneurysm and the iliac artery occlusive disease concomitantly is certainly a reasonable option, and particularly for a younger healthier patient this may be the ideal treatment option.  This is the option of the patient would prefer by far.  We have discussed endovascular abdominal aortic aneurysm repair and how it is performed.  We have discussed that generally, we can do this in a percutaneous manner and expect a 1 night hospital stay.  Additional iliac interventions are almost certainly going to be required particularly on the left side where the disease appears to be in the external iliac artery predominantly.  Prior to an abdominal aortic aneurysm repair, he should have a CT angiogram of the abdomen pelvis and this will help our preoperative planning significantly.  This will be ordered and his abdominal aortic aneurysm repair and concomitant iliac intervention will be planned in the near future at his convenience.      [name] 07/14/2019, 2:04 PM   This note was created with Dragon medical transcription system.  Any errors from dictation are unintentional.

## 2019-07-14 NOTE — Assessment & Plan Note (Signed)
lipid control important in reducing the progression of atherosclerotic disease.  Not currently on a statin, but if we do perform intervention, statin therapy would be used postoperatively.

## 2019-07-14 NOTE — Assessment & Plan Note (Signed)
We had a discussion for approximately 3 minutes regarding the absolute need for smoking cessation due to the deleterious nature of tobacco on the vascular system. We discussed the tobacco use would diminish patency of any intervention, and likely significantly worsen progression of disease. We discussed multiple agents for quitting including replacement therapy or medications to reduce cravings such as Chantix which she has recently started. The patient voices their understanding of the importance of smoking cessation.

## 2019-07-14 NOTE — Assessment & Plan Note (Signed)
Scheduled to have an endocrinology evaluation in the near future.

## 2019-07-14 NOTE — Patient Instructions (Signed)
Abdominal Aortic Aneurysm Endograft Repair  Abdominal aortic aneurysm endograft repair is a surgery to fix an aortic aneurysm in the abdominal area. An aneurysm is a weak or damaged part of an artery wall that bulges out from the normal force of blood pumping through the body. An abdominal aortic aneurysm is an aneurysm that happens in the lower part of the aorta, which is the main artery of the body. The repair is often done if the aneurysm gets so large that it might burst (rupture). A ruptured aneurysm would cause bleeding inside the body that could put a person's life in danger. Before that happens, this procedure is needed to fix the problem. The procedure may also be done if the aneurysm causes symptoms such as pain in the back, abdomen, or side. In this procedure, a tube made of fabric and metal mesh (endograft or stent-graft) is placed in the weak part of the aorta to repair it. Tell a health care provider about:  Any allergies you have.  All medicines you are taking, including vitamins, herbs, eye drops, creams, and over-the-counter medicines.  Any problems you or family members have had with anesthetic medicines.  Any blood disorders you have.  Any surgeries you have had.  Any medical conditions you have.  Whether you are pregnant or may be pregnant. What are the risks? Generally, this is a safe procedure. However, problems may occur, including:  Infection of the graft or incision area.  Bleeding during the procedure or from the incision site.  Allergic reactions to medicines.  Damage to other structures or organs.  Blood leaking out around the endograft.  The endograft moving from where it was placed during surgery.  Blood flow through the graft becoming blocked.  Blood clots.  Kidney problems.  Blood flow to the legs becoming blocked (rare).  Rupture of the aorta even after the endograft repair is a success (rare). What happens before the procedure? Staying  hydrated Follow instructions from your health care provider about hydration, which may include:  Up to 2 hours before the procedure - you may continue to drink clear liquids, such as water, clear fruit juice, black coffee, and plain tea. Eating and drinking restrictions Follow instructions from your health care provider about eating and drinking, which may include:  8 hours before the procedure - stop eating heavy meals or foods such as meat, fried foods, or fatty foods.  6 hours before the procedure - stop eating light meals or foods, such as toast or cereal.  6 hours before the procedure - stop drinking milk or drinks that contain milk.  2 hours before the procedure - stop drinking clear liquids. Medicines  Ask your health care provider about: ? Changing or stopping your regular medicines. This is especially important if you are taking diabetes medicines or blood thinners. ? Taking medicines such as aspirin and ibuprofen. These medicines can thin your blood. Do not take these medicines before your procedure if your health care provider instructs you not to.  You may be given antibiotic medicine to help prevent infection. General instructions  You may need to have blood tests, a test to check heart rhythm (electrocardiogram, or ECG), or a test to check blood flow (angiogram) before the surgery.  Imaging tests will be done to check the size and location of the aneurysm. These tests could include an ultrasound, a CT scan, or an MRI.  Do not use any products that contain nicotine or tobacco--such as cigarettes and e-cigarettes--for as   long as possible before the surgery. If you need help quitting, ask your health care provider.  Ask your health care provider how your surgical site will be marked or identified.  Plan to have someone take you home from the hospital or clinic. What happens during the procedure?  To reduce your risk of infection: ? Your health care team will wash or  sanitize their hands. ? Your skin will be washed with soap. ? Hair may be removed from the surgical area.  An IV tube will be inserted into one of your veins.  You will be given one or more of the following: ? A medicine to help you relax (sedative). ? A medicine to numb the area (local anesthetic). ? A medicine to make you fall asleep (general anesthetic). ? A medicine that is injected into an area of your body to numb everything below the injection site (regional anesthetic).  During the surgery: ? Small incisions or a puncture will be made on one or both sides of the groin. Long, thin tubes (catheters) will be passed through the opening, put into the artery in your thigh, and moved up into the aneurysm in the aorta. ? The health care provider will use live X-ray pictures to guide the endograft through the catheterto the place where the aneurysm is. ? The endograft will be released to seal off the aneurysm and to line the aorta. It will keep blood from flowing into the aneurysm and will help keep it from rupturing. The endograft will stay in place and will not be taken out. ? X-rays will be used to check where the endograft is placed and to make sure that it is where it should be. ? The catheter will be taken out, and the incision will be closed with stitches (sutures). The procedure may vary among health care providers and hospitals. What happens after the procedure?  Your blood pressure, heart rate, breathing rate, and blood oxygen level will be monitored until the medicines you were given have worn off.  You will need to lie flat for a number of hours. Bending your legs can cause them to bleed and swell.  You will then be urged to get up and move around a number of times each day and to slowly become more active.  You will be given medicines to control pain.  Certain tests may be done after your procedure to check how well the endograft is working and to check its placement.  Do  not drive for 24 hours if you received a sedative. This information is not intended to replace advice given to you by your health care provider. Make sure you discuss any questions you have with your health care provider. Document Revised: 03/01/2017 Document Reviewed: 06/13/2015 [employer] Patient Education  2020 [employer]  

## 2019-07-14 NOTE — Assessment & Plan Note (Signed)
blood pressure control important in reducing the progression of atherosclerotic disease. On appropriate oral medications.  

## 2019-07-16 ENCOUNTER — Other Ambulatory Visit (HOSPITAL_COMMUNITY): Payer: Self-pay | Admitting: Surgery

## 2019-07-16 ENCOUNTER — Other Ambulatory Visit: Payer: Self-pay | Admitting: Surgery

## 2019-07-16 DIAGNOSIS — E210 Primary hyperparathyroidism: Secondary | ICD-10-CM

## 2019-07-22 ENCOUNTER — Ambulatory Visit: Payer: 59

## 2019-07-24 ENCOUNTER — Ambulatory Visit
Admission: RE | Admit: 2019-07-24 | Discharge: 2019-07-24 | Disposition: A | Discharge status: Home / Self Care | Payer: 59 | Source: Ambulatory Visit | Attending: Vascular Surgery | Admitting: Vascular Surgery

## 2019-07-24 ENCOUNTER — Other Ambulatory Visit: Payer: Self-pay | Admitting: Family Medicine

## 2019-07-24 DIAGNOSIS — I713 Abdominal aortic aneurysm, ruptured, unspecified: Secondary | ICD-10-CM

## 2019-07-24 MED ORDER — IOPAMIDOL (ISOVUE-370) INJECTION 76%
75.0000 mL | Freq: Once | INTRAVENOUS | Status: AC | PRN
  Administered 2019-07-24: 09:00:00 75 mL via INTRAVENOUS

## 2019-07-27 ENCOUNTER — Encounter (HOSPITAL_COMMUNITY): Payer: 59

## 2019-07-27 ENCOUNTER — Other Ambulatory Visit (HOSPITAL_COMMUNITY): Payer: 59

## 2019-07-27 ENCOUNTER — Encounter (HOSPITAL_COMMUNITY): Payer: Self-pay

## 2019-07-28 ENCOUNTER — Other Ambulatory Visit: Payer: Self-pay | Admitting: Family Medicine

## 2019-07-29 NOTE — Telephone Encounter (Signed)
Last filled 11/2018 with 3 refills.... Last OV 04/2019... please advise

## 2019-07-31 ENCOUNTER — Other Ambulatory Visit: Payer: Self-pay

## 2019-07-31 ENCOUNTER — Encounter (INDEPENDENT_AMBULATORY_CARE_PROVIDER_SITE_OTHER): Payer: Self-pay | Admitting: Vascular Surgery

## 2019-07-31 ENCOUNTER — Ambulatory Visit (INDEPENDENT_AMBULATORY_CARE_PROVIDER_SITE_OTHER): Payer: 59 | Admitting: Vascular Surgery

## 2019-07-31 VITALS — BP 118/79 | HR 71 | Ht 72.0 in | Wt 196.0 lb

## 2019-07-31 DIAGNOSIS — I10 Essential (primary) hypertension: Secondary | ICD-10-CM | POA: Diagnosis not present

## 2019-07-31 DIAGNOSIS — E7800 Pure hypercholesterolemia, unspecified: Secondary | ICD-10-CM

## 2019-07-31 DIAGNOSIS — I70213 Atherosclerosis of native arteries of extremities with intermittent claudication, bilateral legs: Secondary | ICD-10-CM

## 2019-07-31 DIAGNOSIS — F17200 Nicotine dependence, unspecified, uncomplicated: Secondary | ICD-10-CM

## 2019-07-31 DIAGNOSIS — I714 Abdominal aortic aneurysm, without rupture, unspecified: Secondary | ICD-10-CM

## 2019-07-31 NOTE — Progress Notes (Signed)
MRN : [id number]  Antonio Russell is a 59 y.o. ([birth year]) male who presents with chief complaint of  Chief Complaint  Patient presents with  . Follow-up    Ct results  .  History of Present Illness: Patient returns today in follow up of his abdominal aortic aneurysm and his claudication.  He has undergone a CT angiogram which I have independently reviewed.  He has an infrarenal abdominal aortic aneurysm which measures 4.2 cm in maximal diameter.  For an aneurysm that was only 2.8 cm 5 years ago, this is steady and consistent growth.  It measured 3.9 cm on duplex earlier this year.  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  There appears to be some disease in the right iliac system by CT scan although it does not appear as high-grade.  This did appear hemodynamically significant on duplex earlier this year.  He continues to smoke but understands the need for smoking cessation.  Current Outpatient Medications  Medication Sig Dispense Refill  . Alirocumab (PRALUENT) 75 MG/ML SOAJ Inject 1 pen into the skin every 14 (fourteen) days. 2 pen 5  . CHANTIX 1 MG tablet TAKE 1 TABLET (1 MG TOTAL) BY MOUTH 2 (TWO) TIMES DAILY. START AFTER STARTER PACK 180 tablet 0  . fexofenadine (ALLEGRA) 60 MG tablet Take 60 mg by mouth daily.    . fluticasone (FLONASE) 50 MCG/ACT nasal spray SPRAY 2 SPRAYS INTO EACH NOSTRIL EVERY DAY 16 g 5  . meloxicam (MOBIC) 15 MG tablet TAKE 1 TABLET (15 MG TOTAL) BY MOUTH DAILY AS NEEDED FOR PAIN (JOINT PAIN). WITH FOOD 30 tablet 3  . metoprolol succinate (TOPROL-XL) 100 MG 24 hr tablet Take 1 tablet (100 mg total) by mouth daily. Take with or immediately following a meal. 30 tablet 11  . varenicline (CHANTIX STARTING MONTH PAK) 0.5 MG X 11 & 1 MG X 42 tablet TAKE AS DIRECTED ON PACKAGE 53 each 0  . Vitamin D, Ergocalciferol, (DRISDOL)  1.25 MG (50000 UNIT) CAPS capsule Take 1 capsule (50,000 Units total) by mouth every 7 (seven) days. For 12 weeks 12 capsule 0  . aspirin EC 81 MG tablet Take 1 tablet (81 mg total) by mouth daily. (Patient not taking: Reported on 07/31/2019) 90 tablet 3  . cetirizine (ZYRTEC) 10 MG tablet Take 10 mg by mouth daily.     No current facility-administered medications for this visit.    Past Medical History:  Diagnosis Date  . Allergic rhinitis   . Allergy   . Anxiety   . Arthritis   . Back pain   . Chest pain 2012   cardiac cath - nonobstructive, nl LV fxn, rec aggressive med management  . GERD (gastroesophageal reflux disease)   . Headache(784.0)   . HLD (hyperlipidemia)   . Impotence of organic origin   . Other testicular hypofunction   . Personal history of urinary calculi   . Sleep apnea   . Tobacco use disorder   . Unspecified hearing loss     Past Surgical History:  Procedure Laterality Date  . COLONOSCOPY    . HEMORRHOID SURGERY    . KNEE SURGERY Right 2013  . NASAL SEPTUM SURGERY       Social History   Tobacco Use  . Smoking status: Current Every Day Smoker    Packs/day: 0.50    Years:  35.00    Pack years: 17.50    Types: Cigarettes  . Smokeless tobacco: Former Network engineer Use Topics  . Alcohol use: Yes    Alcohol/week: 0.0 standard drinks    Comment: 6 pack 3 times weekly  . Drug use: No     Family History  Problem Relation Age of Onset  . Hypertension Mother   . Hyperlipidemia Mother   . Diabetes Mother   . Hypertension Father   . Hyperlipidemia Father   . Cancer Father        throat  . Hypertension Sister   . Lung cancer Other        GF  . Heart disease Other        GM  . Esophageal cancer Paternal Uncle   . Colon cancer Neg Hx   . Stomach cancer Neg Hx   . Rectal cancer Neg Hx      Allergies  Allergen Reactions  . Crestor [Rosuvastatin Calcium] Other (See Comments)    Muscle aches  . Lipitor [Atorvastatin Calcium] Other (See  Comments)    Arm soreness   . Sertraline Hcl Other (See Comments)    REACTION: muscle twitches  . Zocor [Simvastatin - High Dose] Other (See Comments)    Muscle pain   . Amlodipine     Sexual side effects     REVIEW OF SYSTEMS (Negative unless checked) REVIEW OF SYSTEMS (Negative unless checked)  Constitutional: [] ?Weight loss  [] ?Fever  [] ?Chills Cardiac: [] ?Chest pain   [] ?Chest pressure   [] ?Palpitations   [] ?Shortness of breath when laying flat   [] ?Shortness of breath at rest   [] ?Shortness of breath with exertion. Vascular:  [x] ?Pain in legs with walking   [] ?Pain in legs at rest   [] ?Pain in legs when laying flat   [x] ?Claudication   [] ?Pain in feet when walking  [] ?Pain in feet at rest  [] ?Pain in feet when laying flat   [] ?History of DVT   [] ?Phlebitis   [] ?Swelling in legs   [] ?Varicose veins   [] ?Non-healing ulcers Pulmonary:   [] ?Uses home oxygen   [] ?Productive cough   [] ?Hemoptysis   [] ?Wheeze  [] ?COPD   [] ?Asthma Neurologic:  [] ?Dizziness  [] ?Blackouts   [] ?Seizures   [] ?History of stroke   [] ?History of TIA  [] ?Aphasia   [] ?Temporary blindness   [] ?Dysphagia   [] ?Weakness or numbness in arms   [] ?Weakness or numbness in legs Musculoskeletal:  [x] ?Arthritis   [] ?Joint swelling   [x] ?Joint pain   [] ?Low back pain Hematologic:  [] ?Easy bruising  [] ?Easy bleeding   [] ?Hypercoagulable state   [] ?Anemic  [] ?Hepatitis Gastrointestinal:  [] ?Blood in stool   [] ?Vomiting blood  [x] ?Gastroesophageal reflux/heartburn   [] ?Abdominal pain Genitourinary:  [] ?Chronic kidney disease   [] ?Difficult urination  [] ?Frequent urination  [] ?Burning with urination   [] ?Hematuria Skin:  [] ?Rashes   [] ?Ulcers   [] ?Wounds Psychological:  [x] ?History of anxiety   [] ? History of major depression.  Physical Examination  BP 118/79   Pulse 71   Ht 6' (1.829 m)   Wt 196 lb (88.9 kg)   BMI 26.58 kg/m  Gen:  WD/WN, NAD Head: Central/AT, No temporalis wasting. Ear/Nose/Throat: Hearing grossly intact,  nares w/o erythema or drainage Eyes: Conjunctiva clear. Sclera non-icteric Neck: Supple.  Trachea midline Pulmonary:  Good air movement, no use of accessory muscles.  Cardiac: RRR, no JVD Vascular:  Vessel Right Left  Radial Palpable Palpable  PT Palpable Palpable  DP Palpable Palpable   Gastrointestinal: soft, non-tender/non-distended.  Musculoskeletal: M/S 5/5 throughout.  No deformity or atrophy. No edema. Neurologic: Sensation grossly intact in extremities.  Symmetrical.  Speech is fluent.  Psychiatric: Judgment intact, Mood & affect appropriate for pt's clinical situation. Dermatologic: No rashes or ulcers noted.  No cellulitis or open wounds.       Labs Recent Results (from the past 2160 hour(s))  VITAMIN D 25 Hydroxy (Vit-D Deficiency, Fractures)     Status: Abnormal   Collection Time: 06/05/19  1:58 PM  Result Value Ref Range   VITD 27.43 (L) 30.00 - 100.00 ng/mL  Parathyroid hormone, intact (no Ca)     Status: None   Collection Time: 06/12/19  8:52 AM  Result Value Ref Range   PTH 60 15 - 65 pg/mL  Vitamin D 1,25 dihydroxy     Status: Abnormal   Collection Time: 06/12/19  8:52 AM  Result Value Ref Range   Vitamin D 1, 25 (OH)2 Total 120 (H) 18 - 72 pg/mL   Vitamin D3 1, 25 (OH)2 68 pg/mL   Vitamin D2 1, 25 (OH)2 52 pg/mL    Comment: [name] Vitamin D3, 1,25(OH)2 indicates both endogenous production and supplementation. Vitamin D2, 1,25(OH)2 is an indicator of exogenous sources, such as diet or supplementation.  Interpretation and therapy are based on measurement of Vitamin D,1,25(OH)2, Total. . . This test was developed and its analytical performance characteristics have been determined by [facility], [place], [place]. It has not been cleared or approved by the FDA. This assay has been validated pursuant to the CLIA regulations and is used for clinical purposes. .   Phosphorus     Status: Abnormal    Collection Time: 06/12/19  8:52 AM  Result Value Ref Range   Phosphorus 2.1 (L) 2.3 - 4.6 mg/dL  Magnesium     Status: None   Collection Time: 06/12/19  8:52 AM  Result Value Ref Range   Magnesium 2.2 1.5 - 2.5 mg/dL  BASIC METABOLIC PANEL WITH GFR     Status: Abnormal   Collection Time: 06/12/19  8:52 AM  Result Value Ref Range   Glucose, Bld 109 (H) 65 - 99 mg/dL    Comment: .            Fasting reference interval . For someone without known diabetes, a glucose value between 100 and 125 mg/dL is consistent with prediabetes and should be confirmed with a follow-up test. .    BUN 13 7 - 25 mg/dL   Creat 0.94 0.70 - 1.33 mg/dL    Comment: For patients >65 years of age, the reference limit for Creatinine is approximately 13% higher for people identified as African-American. .    GFR, Est Non African American 89 > OR = 60 mL/min/1.45m2   GFR, Est African American 103 > OR = 60 mL/min/1.46m2   BUN/Creatinine Ratio NOT APPLICABLE 6 - 22 (calc)   Sodium 136 135 - 146 mmol/L   Potassium 4.9 3.5 - 5.3 mmol/L   Chloride 105 98 - 110 mmol/L   CO2 22 20 - 32 mmol/L   Calcium 11.4 (H) 8.6 - 10.3 mg/dL  Creatinine, urine, 24 hour     Status: None   Collection Time: 06/12/19  8:52 AM  Result Value Ref Range   Creatinine, 24H Ur 1.56 0.50 - 2.15 g/24 h  Calcium, urine, 24 hour     Status: Abnormal   Collection Time: 06/12/19  8:52 AM  Result Value Ref Range   Calcium, 24H Urine 401 (H) mg/24 h    Comment:                           Reference Range  55-300                            Low calcium diet 55-200     Radiology VAS [place] ABI WITH/WO TBI  Result Date: 07/07/2019 LOWER EXTREMITY DOPPLER STUDY Indications: Claudication. High Risk Factors: Hypertension, hyperlipidemia, current smoker, coronary artery                    disease. Other Factors: Bilateral buttock claudication walking 100-150 yards on flat                ground, relieved with rest, and worse on an incline.                  SEE LEA DUPLEX REPORT.  Comparison Study: CT Abdomen in 11/2014 showed mild atherosclerotic plaques and                   calcifications are noted distal abdominal aorta and iliac                   arteries. Performing Technologist: [employer] BS, RVT, RDCS  Examination Guidelines: A complete evaluation includes at minimum, Doppler waveform signals and systolic blood pressure reading at the level of bilateral brachial, anterior tibial, and posterior tibial arteries, when vessel segments are accessible. Bilateral testing is considered an integral part of a complete examination. Photoelectric Plethysmograph (PPG) waveforms and toe systolic pressure readings are included as required and additional duplex testing as needed. Limited examinations for reoccurring indications may be performed as noted.  ABI Findings: +---------+------------------+-----+--------+--------+ Right    Rt Pressure (mmHg)IndexWaveformComment  +---------+------------------+-----+--------+--------+ Brachial 105                                     +---------+------------------+-----+--------+--------+ ATA      103               0.94 biphasic         +---------+------------------+-----+--------+--------+ PTA      137               1.25 biphasic         +---------+------------------+-----+--------+--------+ PERO     117               1.06 biphasic         +---------+------------------+-----+--------+--------+ Gardiner Rhyme               1.07 Normal           +---------+------------------+-----+--------+--------+ +---------+------------------+-----+--------+-------+ Left     Lt Pressure (mmHg)IndexWaveformComment +---------+------------------+-----+--------+-------+ Brachial 110                                    +---------+------------------+-----+--------+-------+ ATA      120               1.09 biphasic        +---------+------------------+-----+--------+-------+ PTA      133  1.21 biphasic        +---------+------------------+-----+--------+-------+ PERO     107               0.97 biphasic        +---------+------------------+-----+--------+-------+ Berton Mount               1.06 Normal          +---------+------------------+-----+--------+-------+ +-------+-----------+-----------+------------+------------+ ABI/TBIToday's ABIToday's TBIPrevious ABIPrevious TBI +-------+-----------+-----------+------------+------------+ Right  1.25       1.07       1.2         1.0          +-------+-----------+-----------+------------+------------+ Left   1.21       1.07       1.2         1.1          +-------+-----------+-----------+------------+------------+ Bilateral ABIs and TBIs appear essentially unchanged compared to prior study on 02/08/2011.  Summary: Right: Resting right ankle-brachial index is within normal range. No evidence of significant right lower extremity arterial disease. The right toe-brachial index is normal. Left: Resting left ankle-brachial index is within normal range. No evidence of significant left lower extremity arterial disease. The left toe-brachial index is normal.  *See table(s) above for measurements and observations.  Electronically signed by Carlyle Dolly MD on 07/07/2019 at 4:35:43 PM.    Final    VAS [place] LOWER EXTREMITY ARTERIAL DUPLEX  Result Date: 07/07/2019 LOWER EXTREMITY ARTERIAL DUPLEX STUDY Indications: Claudication. High Risk Factors: Hypertension, hyperlipidemia, current smoker, coronary artery                    disease. Other Factors: Bilateral buttock claudication walking 100-150 yards on flat                ground, relieved with rest, and worse on an incline.                 SEE ABI REPORT.  Current ABI: Bilateral ABI's and toe-brachial indices are normal. Right ABI is              1.25, left is 1.21. Comparison Study: 02/08/2011 Arterial Doppler was normal.                   CT Abdomen in 11/2014 showed mild  atherosclerotic plaques and                   calcifications are noted distal abdominal aorta and iliac                   arteries. Performing Technologist: [employer] BS, RVT, RDCS  Examination Guidelines: A complete evaluation includes B-mode imaging, spectral Doppler, color Doppler, and power Doppler as needed of all accessible portions of each vessel. Bilateral testing is considered an integral part of a complete examination. Limited examinations for reoccurring indications may be performed as noted. Aorta: +--------+-------+----------+----------+--------+--------+--------+         AP (cm)Trans (cm)PSV (cm/s)WaveformThrombusShape    +--------+-------+----------+----------+--------+--------+--------+ Proximal       2.08      74        biphasic                 +--------+-------+----------+----------+--------+--------+--------+ Mid     2.57   3.33                biphasicPresent          +--------+-------+----------+----------+--------+--------+--------+ Distal  3.86   3.75  68        biphasicPresent fusiform +--------+-------+----------+----------+--------+--------+--------+ New finding of infrarenal fusiform AAA.  +----------+--------+-----+---------------+----------+-------------------+ RIGHT     PSV cm/sRatioStenosis       Waveform  Comments            +----------+--------+-----+---------------+----------+-------------------+ CIA Prox  452          75-99% stenosistriphasic 1.1 cm x 1.3 cm     +----------+--------+-----+---------------+----------+-------------------+ CIA Mid   196                         monophasic                    +----------+--------+-----+---------------+----------+-------------------+ CIA Distal176                         monophasic                    +----------+--------+-----+---------------+----------+-------------------+ EIA Prox  127                         monophasic                     +----------+--------+-----+---------------+----------+-------------------+ EIA Mid   198                         biphasic                      +----------+--------+-----+---------------+----------+-------------------+ EIA Distal125                         biphasic                      +----------+--------+-----+---------------+----------+-------------------+ CFA Prox  122                         triphasic                     +----------+--------+-----+---------------+----------+-------------------+ CFA Distal77                          biphasic  smooth plaque noted +----------+--------+-----+---------------+----------+-------------------+ DFA       89                          triphasic                     +----------+--------+-----+---------------+----------+-------------------+ SFA Prox  95                          biphasic  smooth plaque       +----------+--------+-----+---------------+----------+-------------------+ SFA Mid   71                          triphasic                     +----------+--------+-----+---------------+----------+-------------------+ SFA Distal92                          biphasic                      +----------+--------+-----+---------------+----------+-------------------+  POP Prox  47                          biphasic                      +----------+--------+-----+---------------+----------+-------------------+ POP Distal44                          biphasic                      +----------+--------+-----+---------------+----------+-------------------+ TP Trunk  59                          triphasic                     +----------+--------+-----+---------------+----------+-------------------+ ATA Prox  45                          biphasic                      +----------+--------+-----+---------------+----------+-------------------+ PTA Mid   46                          biphasic                       +----------+--------+-----+---------------+----------+-------------------+ PERO Mid  19                          biphasic                      +----------+--------+-----+---------------+----------+-------------------+  +----------+--------+-----+---------------+----------+-------------------+ LEFT      PSV cm/sRatioStenosis       Waveform  Comments            +----------+--------+-----+---------------+----------+-------------------+ CIA Prox  112                         biphasic  1.0 cm x 1.1 cm     +----------+--------+-----+---------------+----------+-------------------+ CIA Mid   131                         biphasic                      +----------+--------+-----+---------------+----------+-------------------+ CIA Distal194                         monophasic                    +----------+--------+-----+---------------+----------+-------------------+ EIA Prox  409          75-99% stenosistriphasic                     +----------+--------+-----+---------------+----------+-------------------+ EIA Mid   125                         biphasic                      +----------+--------+-----+---------------+----------+-------------------+ EIA Distal111                         triphasic                     +----------+--------+-----+---------------+----------+-------------------+  CFA Prox  103                         biphasic                      +----------+--------+-----+---------------+----------+-------------------+ CFA Distal110                         triphasic smooth plaque noted +----------+--------+-----+---------------+----------+-------------------+ DFA       125                         triphasic                     +----------+--------+-----+---------------+----------+-------------------+ SFA Prox  120                         biphasic  smooth plaque        +----------+--------+-----+---------------+----------+-------------------+ SFA Mid   86                          biphasic                      +----------+--------+-----+---------------+----------+-------------------+ SFA Distal83                          biphasic                      +----------+--------+-----+---------------+----------+-------------------+ POP Prox  51                          biphasic                      +----------+--------+-----+---------------+----------+-------------------+ POP Distal41                          biphasic  smooth plaque       +----------+--------+-----+---------------+----------+-------------------+ TP Trunk                              triphasic                     +----------+--------+-----+---------------+----------+-------------------+ ATA Mid   40                          biphasic                      +----------+--------+-----+---------------+----------+-------------------+ PTA Mid   46                          biphasic                      +----------+--------+-----+---------------+----------+-------------------+ PERO Prox 75                          biphasic                      +----------+--------+-----+---------------+----------+-------------------+   Summary: Right: 75-99% stenosis noted in the iliac segment. New finding of an infrarenal fusiform AAA. No significant infrainguinal  stenosis seen. Left: 75-99% stenosis noted in the iliac segment. No significant infrainguinal stenosis seen.  See table(s) above for measurements and observations. Vascular consult recommended. Electronically signed by Carlyle Dolly MD on 07/07/2019 at 4:47:05 PM.    Final    CT Angio Abd/Pel w/ and/or w/o  Result Date: 07/24/2019 CLINICAL DATA:  Abdominal aortic aneurysm, preop planning EXAM: CTA ABDOMEN AND PELVIS WITH CONTRAST TECHNIQUE: Multidetector CT imaging of the abdomen and pelvis was performed using the standard  protocol during bolus administration of intravenous contrast. Multiplanar reconstructed images and MIPs were obtained and reviewed to evaluate the vascular anatomy. CONTRAST:  27mL ISOVUE-370 IOPAMIDOL (ISOVUE-370) INJECTION 76% COMPARISON:  11/11/2014 FINDINGS: VASCULAR Aorta: Suprarenal segment unremarkable. Fusiform infrarenal aneurysm 4.2 x 3.8 cm maximum transverse dimensions (previously 2.8) tapering to 2.5 cm at the bifurcation. There is smooth eccentric nonocclusive mural thrombus within the lumen. No dissection or stenosis. Celiac: Patent without evidence of aneurysm, dissection, vasculitis or significant stenosis. SMA: Patent without evidence of aneurysm, dissection, vasculitis or significant stenosis. Renals: Both renal arteries are patent without evidence of aneurysm, dissection, vasculitis, fibromuscular dysplasia or significant stenosis. IMA: Patent, arising from the proximal aspect of the aneurysmal segment. Inflow: Calcified plaque at the origin of the right common iliac artery resulting in only mild short-segment stenosis, patent distally. Short-segment stenosis at the origin of the left external iliac artery, of possible hemodynamic significance. 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 Proximal Outflow: Bilateral common femoral and visualized portions of the superficial and profunda femoral arteries are patent without evidence of aneurysm, dissection, vasculitis or significant stenosis. Veins: Patent hepatic veins, portal vein, SMV, splenic vein, bilateral renal veins, IVC. No venous pathology evident. Review of the MIP images confirms the above findings. NON-VASCULAR Lower chest: No acute abnormality. Hepatobiliary: Stable left hepatic cyst. No new liver lesion. no gallstones, gallbladder wall thickening, or biliary dilatation. Pancreas: Unremarkable. No pancreatic ductal dilatation or surrounding inflammatory changes. Spleen: Normal in size without focal abnormality.  Adrenals/Urinary Tract: 1.4 cm low-attenuation left adrenal nodule, previously 1.2 cm, presumably benign adenoma. Right adrenal unremarkable. Stable 1.2 cm cyst from the lower pole right kidney. No hydronephrosis. Urinary bladder incompletely distended. Stomach/Bowel: Stomach is incompletely distended. Small bowel is decompressed. Normal appendix. The colon is nondilated, unremarkable. Lymphatic: No abdominal or pelvic adenopathy. Reproductive: Mild prostatic enlargement with central coarse calcifications. Other: No ascites. No free air. Musculoskeletal: Degenerative disc disease L5-S1. Mild bilateral hip DJD. No fracture or worrisome bone lesion. IMPRESSION: 1. 4.2 cm infrarenal abdominal aortic aneurysm (previously 2.8 in [id number]) without complicating features. Recommend followup by abdomen and pelvis CTA in 6 months, and vascular surgery referral/consultation if not already obtained. This recommendation follows ACR consensus guidelines: White Paper of the ACR Incidental Findings Committee II on Vascular Findings. J Am Coll Radiol 2013; 10:789-794. 2. Short-segment stenosis at the origin of the left external iliac artery, of possible hemodynamic significance. 3. 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 Electronically Signed   By: Lucrezia Europe M.D.   On: 07/24/2019 10:15    Assessment/Plan Hypercalcemia Scheduled to have an endocrinology evaluation in the near future.  Hyperlipidemia lipid control important in reducing the progression of atherosclerotic disease.  Not currently on a statin, but if we do perform intervention, statin therapy would be used postoperatively.   Essential hypertension blood pressure control important in reducing the progression of atherosclerotic disease. On appropriate oral medications.  TOBACCO USE We had a discussion for approximately 3 minutes regarding the  absolute need for smoking cessation due to the deleterious nature of tobacco on the vascular system.  We discussed the tobacco use would diminish patency of any intervention, and likely significantly worsen progressio of disease. We discussed multiple agents for quitting including replacement therapy or medications to reduce cravings such as Chantix. The patient voices their understanding of the importance of smoking cessation.   Atherosclerosis of native arteries of extremity with intermittent claudication ([place]) He has undergone a CT angiogram which I have independently reviewed.  He has an infrarenal abdominal aortic aneurysm which measures 4.2 cm in maximal diameter.  For an aneurysm that was only 2.8 cm 5 years ago, this is steady and consistent growth.  It measured 3.9 cm on duplex earlier this year.  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  There appears to be some disease in the right iliac system by CT scan although it does not appear as high-grade.  This did appear hemodynamically significant on duplex earlier this year. He is bothered significantly by his claudication symptoms and does desire to have something done for this.  Particularly with the aneurysm, this can be fixed concomitantly.  His aneurysm in and of itself would not meet threshold for repair, but will need to be repaired if we are addressing his iliac artery disease and given the fact that it has had very steady growth over the past 5 years and a 59 year old patient fixing this prior to 5 cm is prudent anyway.  I discussed the details of the procedure.  I discussed the concomitant treatment of iliac occlusive disease in conjunction with the aneurysm repair can all still be done with small incisions in the groin.  He will have a 1 night hospital stay if all goes well.  He voices his understanding and is agreeable to proceed.  AAA (abdominal aortic aneurysm) without rupture ([place]) He has  undergone a CT angiogram which I have independently reviewed.  He has an infrarenal abdominal aortic aneurysm which measures 4.2 cm in maximal diameter.  For an aneurysm that was only 2.8 cm 5 years ago, this is steady and consistent growth.  It measured 3.9 cm on duplex earlier this year.  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  There appears to be some disease in the right iliac system by CT scan although it does not appear as high-grade.  This did appear hemodynamically significant on duplex earlier this year. He is bothered significantly by his claudication symptoms and does desire to have something done for this.  Particularly with the aneurysm, this can be fixed concomitantly.  His aneurysm in and of itself would not meet threshold for repair, but will need to be repaired if we are addressing his iliac artery disease and given the fact that it has had very steady growth over the past 5 years and a 59 year old patient fixing this prior to 5 cm is prudent anyway.  I discussed the details of the procedure.  I discussed the concomitant treatment of iliac occlusive disease in conjunction with the aneurysm repair can all still be done with small incisions in the groin.  He will have a 1 night hospital stay if all goes well.  He voices his understanding and is agreeable to proceed.  [name], MD  07/31/2019 10:45 AM    This note was created with Dragon medical transcription system.  Any errors from dictation are purely unintentional

## 2019-07-31 NOTE — Assessment & Plan Note (Signed)
He has undergone a CT angiogram which I have independently reviewed.  He has an infrarenal abdominal aortic aneurysm which measures 4.2 cm in maximal diameter.  For an aneurysm that was only 2.8 cm 5 years ago, this is steady and consistent growth.  It measured 3.9 cm on duplex earlier this year.  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  There appears to be some disease in the right iliac system by CT scan although it does not appear as high-grade.  This did appear hemodynamically significant on duplex earlier this year. He is bothered significantly by his claudication symptoms and does desire to have something done for this.  Particularly with the aneurysm, this can be fixed concomitantly.  His aneurysm in and of itself would not meet threshold for repair, but will need to be repaired if we are addressing his iliac artery disease and given the fact that it has had very steady growth over the past 5 years and a 59 year old patient fixing this prior to 5 cm is prudent anyway.  I discussed the details of the procedure.  I discussed the concomitant treatment of iliac occlusive disease in conjunction with the aneurysm repair can all still be done with small incisions in the groin.  He will have a 1 night hospital stay if all goes well.  He voices his understanding and is agreeable to proceed.

## 2019-07-31 NOTE — Assessment & Plan Note (Signed)

## 2019-07-31 NOTE — Patient Instructions (Signed)
Abdominal Aortic Aneurysm Endograft Repair  Abdominal aortic aneurysm endograft repair is a surgery to fix an aortic aneurysm in the abdominal area. An aneurysm is a weak or damaged part of an artery wall that bulges out from the normal force of blood pumping through the body. An abdominal aortic aneurysm is an aneurysm that happens in the lower part of the aorta, which is the main artery of the body. The repair is often done if the aneurysm gets so large that it might burst (rupture). A ruptured aneurysm would cause bleeding inside the body that could put a person's life in danger. Before that happens, this procedure is needed to fix the problem. The procedure may also be done if the aneurysm causes symptoms such as pain in the back, abdomen, or side. In this procedure, a tube made of fabric and metal mesh (endograft or stent-graft) is placed in the weak part of the aorta to repair it. Tell a health care provider about:  Any allergies you have.  All medicines you are taking, including vitamins, herbs, eye drops, creams, and over-the-counter medicines.  Any problems you or family members have had with anesthetic medicines.  Any blood disorders you have.  Any surgeries you have had.  Any medical conditions you have.  Whether you are pregnant or may be pregnant. What are the risks? Generally, this is a safe procedure. However, problems may occur, including:  Infection of the graft or incision area.  Bleeding during the procedure or from the incision site.  Allergic reactions to medicines.  Damage to other structures or organs.  Blood leaking out around the endograft.  The endograft moving from where it was placed during surgery.  Blood flow through the graft becoming blocked.  Blood clots.  Kidney problems.  Blood flow to the legs becoming blocked (rare).  Rupture of the aorta even after the endograft repair is a success (rare). What happens before the procedure? Staying  hydrated Follow instructions from your health care provider about hydration, which may include:  Up to 2 hours before the procedure - you may continue to drink clear liquids, such as water, clear fruit juice, black coffee, and plain tea. Eating and drinking restrictions Follow instructions from your health care provider about eating and drinking, which may include:  8 hours before the procedure - stop eating heavy meals or foods such as meat, fried foods, or fatty foods.  6 hours before the procedure - stop eating light meals or foods, such as toast or cereal.  6 hours before the procedure - stop drinking milk or drinks that contain milk.  2 hours before the procedure - stop drinking clear liquids. Medicines  Ask your health care provider about: ? Changing or stopping your regular medicines. This is especially important if you are taking diabetes medicines or blood thinners. ? Taking medicines such as aspirin and ibuprofen. These medicines can thin your blood. Do not take these medicines before your procedure if your health care provider instructs you not to.  You may be given antibiotic medicine to help prevent infection. General instructions  You may need to have blood tests, a test to check heart rhythm (electrocardiogram, or ECG), or a test to check blood flow (angiogram) before the surgery.  Imaging tests will be done to check the size and location of the aneurysm. These tests could include an ultrasound, a CT scan, or an MRI.  Do not use any products that contain nicotine or tobacco--such as cigarettes and e-cigarettes--for as   long as possible before the surgery. If you need help quitting, ask your health care provider.  Ask your health care provider how your surgical site will be marked or identified.  Plan to have someone take you home from the hospital or clinic. What happens during the procedure?  To reduce your risk of infection: ? Your health care team will wash or  sanitize their hands. ? Your skin will be washed with soap. ? Hair may be removed from the surgical area.  An IV tube will be inserted into one of your veins.  You will be given one or more of the following: ? A medicine to help you relax (sedative). ? A medicine to numb the area (local anesthetic). ? A medicine to make you fall asleep (general anesthetic). ? A medicine that is injected into an area of your body to numb everything below the injection site (regional anesthetic).  During the surgery: ? Small incisions or a puncture will be made on one or both sides of the groin. Long, thin tubes (catheters) will be passed through the opening, put into the artery in your thigh, and moved up into the aneurysm in the aorta. ? The health care provider will use live X-ray pictures to guide the endograft through the catheterto the place where the aneurysm is. ? The endograft will be released to seal off the aneurysm and to line the aorta. It will keep blood from flowing into the aneurysm and will help keep it from rupturing. The endograft will stay in place and will not be taken out. ? X-rays will be used to check where the endograft is placed and to make sure that it is where it should be. ? The catheter will be taken out, and the incision will be closed with stitches (sutures). The procedure may vary among health care providers and hospitals. What happens after the procedure?  Your blood pressure, heart rate, breathing rate, and blood oxygen level will be monitored until the medicines you were given have worn off.  You will need to lie flat for a number of hours. Bending your legs can cause them to bleed and swell.  You will then be urged to get up and move around a number of times each day and to slowly become more active.  You will be given medicines to control pain.  Certain tests may be done after your procedure to check how well the endograft is working and to check its placement.  Do  not drive for 24 hours if you received a sedative. This information is not intended to replace advice given to you by your health care provider. Make sure you discuss any questions you have with your health care provider. Document Revised: 03/01/2017 Document Reviewed: 06/13/2015 [employer] Patient Education  2020 [employer]  

## 2019-08-04 ENCOUNTER — Telehealth (INDEPENDENT_AMBULATORY_CARE_PROVIDER_SITE_OTHER): Payer: Self-pay

## 2019-08-04 ENCOUNTER — Telehealth: Payer: Self-pay | Admitting: Cardiology

## 2019-08-04 NOTE — Telephone Encounter (Signed)
   [facility] Pre-operative Risk Assessment    Request for surgical clearance:  1. What type of surgery is being performed? AAA endovascular Repair    2. When is this surgery scheduled? TBD    3. What type of clearance is required (medical clearance vs. Pharmacy clearance to hold med vs. Both)?  both  4. Are there any medications that need to be held prior to surgery and how long? Plavix and eliquis generally recommended   5. Practice name and name of physician performing surgery? [facility] Dr. Lucky Cowboy    6. What is your office phone number [phone]   7.   What is your office fax number [phone]  8.   Anesthesia type (None, local, MAC, general) ? [place] 08/04/2019, 8:42 AM  _________________________________________________________________   (provider comments below)

## 2019-08-04 NOTE — Telephone Encounter (Signed)
Spoke with the patient and he is now scheduled with Dr. Lucky Cowboy for a Endovascular AAA stent graft repair on 08/12/19. Patient was told to arrive to the [facility] at 11:00 am. Patient will have a phone call pre-op on 08/06/19 between 8-1 and covid testing on 08/07/19 between 8-1 pm. Pre-surgical instructions were discussed and will be mailed to the patient.

## 2019-08-04 NOTE — Telephone Encounter (Signed)
No Plavix of Eliquis on medication active medication list or last OV note.  Patient currently on Aspirin 81mg  daily per Dr [name] noted on 07/10/19.

## 2019-08-04 NOTE — Telephone Encounter (Signed)
   Primary Cardiologist: Kate Sable, MD  Chart reviewed as part of pre-operative protocol coverage. Given past medical history and time since last visit, based on ACC/AHA guidelines, Limmie L Mathe would be at acceptable risk for the planned procedure without further cardiovascular testing. Per chart review this patient is not on Plavix or Eliquis. Okay to hold ASA for 24 hours prior to the procedure. Restart 81 mg daily thereafter.  I will route this recommendation to the requesting party via Epic fax function and remove from pre-op pool.  Please call with questions.  Phill Myron. Vi Whitesel DNP, ANP, AACC  08/04/2019, 1:08 PM

## 2019-08-05 ENCOUNTER — Other Ambulatory Visit (INDEPENDENT_AMBULATORY_CARE_PROVIDER_SITE_OTHER): Payer: Self-pay | Admitting: Nurse Practitioner

## 2019-08-06 ENCOUNTER — Encounter
Admission: RE | Admit: 2019-08-06 | Discharge: 2019-08-06 | Disposition: A | Discharge status: Home / Self Care | Payer: 59 | Source: Ambulatory Visit | Attending: Vascular Surgery | Admitting: Vascular Surgery

## 2019-08-06 ENCOUNTER — Other Ambulatory Visit: Payer: Self-pay

## 2019-08-06 DIAGNOSIS — Z01812 Encounter for preprocedural laboratory examination: Secondary | ICD-10-CM | POA: Diagnosis present

## 2019-08-06 HISTORY — DX: Hyperparathyroidism, unspecified: E21.3

## 2019-08-06 HISTORY — DX: Essential (primary) hypertension: I10

## 2019-08-06 NOTE — Patient Instructions (Signed)
Your procedure is scheduled on: Wednesday Aug 12, 2019. Report to Day Surgery inside [facility]. To find out your arrival time please call [phone] between 1PM - 3PM on Tuesday Aug 11, 2019  Remember: Instructions that are not followed completely may result in serious medical risk,  up to and including death, or upon the discretion of your surgeon and anesthesiologist your  surgery may need to be rescheduled.     _X__ 1. Do not eat food after midnight the night before your procedure.                 No gum chewing or hard candies. You may drink clear liquids up to 2 hours                 before you are scheduled to arrive for your surgery- DO not drink clear                 liquids within 2 hours of the start of your surgery.                 Clear Liquids include:  water, apple juice without pulp, clear Gatorade, G2 or                  Gatorade Zero (avoid Red/Purple/Blue), Black Coffee or Tea (Do not add                 anything to coffee or tea).  __X__2.  On the morning of surgery brush your teeth with toothpaste and water, you                may rinse your mouth with mouthwash if you wish.  Do not swallow any toothpaste of mouthwash.     _X__ 3.  No Alcohol for 24 hours before or after surgery.   _X__ 4.  Do Not Smoke or use e-cigarettes For 24 Hours Prior to Your Surgery.                 Do not use any chewable tobacco products for at least 6 hours prior to                 Surgery.  _X__  5.  Do not use any recreational drugs (marijuana, cocaine, heroin, ecstacy, MDMA or other)                For at least one week prior to your surgery.  Combination of these drugs with anesthesia                May have life threatening results.  __x__ 6.  Notify your doctor if there is any change in your medical condition      (cold, fever, infections).     Do not wear jewelry, make-up, hairpins, clips or nail polish. Do not wear lotions, powders, or perfumes. You  may wear deodorant. Do not shave 48 hours prior to surgery. Men may shave face and neck. Do not bring valuables to the hospital.    [facility] is not responsible for any belongings or valuables.  Contacts, dentures or bridgework may not be worn into surgery. Leave your suitcase in the car. After surgery it may be brought to your room. For patients admitted to the hospital, discharge time is determined by your treatment team.   Patients discharged the day of surgery will not be allowed to drive home.   Make arrangements for someone to be with you  for the first 24 hours of your Same Day Discharge.   __x__ Take these medicines the morning of surgery with A SIP OF WATER:    1. CHANTIX 1 MG   2. fexofenadine (ALLEGRA) 60 MG  __x__ Use CHG Soap as directed  __x__ Use inhalers on the day of surgery  fluticasone (FLONASE) 50 MCG/ACT nasal spray  __x__ Stop Anti-inflammatories such as meloxicam (MOBIC), Ibuprofen, Aleve, naproxen and or BC powders.   __x__ Stop supplements until after surgery.   __x__ Do not start any herbal supplements before your surgery.

## 2019-08-07 ENCOUNTER — Other Ambulatory Visit: Admission: RE | Admit: 2019-08-07 | Payer: 59 | Source: Ambulatory Visit

## 2019-08-10 ENCOUNTER — Other Ambulatory Visit: Payer: Self-pay

## 2019-08-10 ENCOUNTER — Other Ambulatory Visit
Admission: RE | Admit: 2019-08-10 | Discharge: 2019-08-10 | Disposition: A | Discharge status: Home / Self Care | Payer: 59 | Source: Ambulatory Visit | Attending: Vascular Surgery | Admitting: Vascular Surgery

## 2019-08-10 LAB — CBC WITH DIFFERENTIAL/PLATELET
Abs Immature Granulocytes: 0.05 10*3/uL (ref 0.00–0.07)
Basophils Absolute: 0.1 10*3/uL (ref 0.0–0.1)
Basophils Relative: 1 %
Eosinophils Absolute: 0.5 10*3/uL (ref 0.0–0.5)
Eosinophils Relative: 4 %
HCT: 45.9 % (ref 39.0–52.0)
Hemoglobin: 15.7 g/dL (ref 13.0–17.0)
Immature Granulocytes: 1 %
Lymphocytes Relative: 35 %
Lymphs Abs: 3.9 10*3/uL (ref 0.7–4.0)
MCH: 33.1 pg (ref 26.0–34.0)
MCHC: 34.2 g/dL (ref 30.0–36.0)
MCV: 96.6 fL (ref 80.0–100.0)
Monocytes Absolute: 1.1 10*3/uL — ABNORMAL HIGH (ref 0.1–1.0)
Monocytes Relative: 10 %
Neutro Abs: 5.6 10*3/uL (ref 1.7–7.7)
Neutrophils Relative %: 49 %
Platelets: 272 10*3/uL (ref 150–400)
RBC: 4.75 MIL/uL (ref 4.22–5.81)
RDW: 12.7 % (ref 11.5–15.5)
WBC: 11.1 10*3/uL — ABNORMAL HIGH (ref 4.0–10.5)
nRBC: 0 % (ref 0.0–0.2)

## 2019-08-10 LAB — BASIC METABOLIC PANEL
Anion gap: 8 (ref 5–15)
BUN: 14 mg/dL (ref 6–20)
CO2: 22 mmol/L (ref 22–32)
Calcium: 10.8 mg/dL — ABNORMAL HIGH (ref 8.9–10.3)
Chloride: 107 mmol/L (ref 98–111)
Creatinine, Ser: 0.95 mg/dL (ref 0.61–1.24)
GFR calc Af Amer: >60 mL/min (ref >60)
GFR calc non Af Amer: >60 mL/min (ref >60)
Glucose, Bld: 107 mg/dL — ABNORMAL HIGH (ref 70–99)
Potassium: 4.4 mmol/L (ref 3.5–5.1)
Sodium: 137 mmol/L (ref 135–145)

## 2019-08-10 LAB — APTT: aPTT: 27 seconds (ref 24–36)

## 2019-08-10 LAB — PROTIME-INR
INR: 1 (ref 0.8–1.2)
Prothrombin Time: 13 seconds (ref 11.4–15.2)

## 2019-08-10 LAB — SARS CORONAVIRUS 2 (TAT 6-24 HRS): SARS Coronavirus 2: NEGATIVE

## 2019-08-12 ENCOUNTER — Inpatient Hospital Stay: Payer: 59 | Admitting: Anesthesiology

## 2019-08-12 ENCOUNTER — Other Ambulatory Visit: Payer: Self-pay

## 2019-08-12 ENCOUNTER — Encounter: Payer: Self-pay | Admitting: Vascular Surgery

## 2019-08-12 ENCOUNTER — Encounter
Admission: RE | Disposition: A | Discharge status: Home / Self Care | Payer: Self-pay | Source: Home / Self Care | Attending: Vascular Surgery

## 2019-08-12 ENCOUNTER — Inpatient Hospital Stay
Admission: RE | Admit: 2019-08-12 | Discharge: 2019-08-13 | DRG: 269 | Disposition: A | Discharge status: Home / Self Care | Payer: 59 | Attending: Vascular Surgery | Admitting: Vascular Surgery

## 2019-08-12 DIAGNOSIS — G4730 Sleep apnea, unspecified: Secondary | ICD-10-CM | POA: Diagnosis present

## 2019-08-12 DIAGNOSIS — I714 Abdominal aortic aneurysm, without rupture, unspecified: Secondary | ICD-10-CM

## 2019-08-12 DIAGNOSIS — I708 Atherosclerosis of other arteries: Secondary | ICD-10-CM | POA: Diagnosis present

## 2019-08-12 DIAGNOSIS — K219 Gastro-esophageal reflux disease without esophagitis: Secondary | ICD-10-CM | POA: Diagnosis present

## 2019-08-12 DIAGNOSIS — F419 Anxiety disorder, unspecified: Secondary | ICD-10-CM | POA: Diagnosis present

## 2019-08-12 DIAGNOSIS — J309 Allergic rhinitis, unspecified: Secondary | ICD-10-CM | POA: Diagnosis present

## 2019-08-12 DIAGNOSIS — Z20822 Contact with and (suspected) exposure to covid-19: Secondary | ICD-10-CM | POA: Diagnosis present

## 2019-08-12 DIAGNOSIS — Z888 Allergy status to other drugs, medicaments and biological substances status: Secondary | ICD-10-CM

## 2019-08-12 DIAGNOSIS — Y832 Surgical operation with anastomosis, bypass or graft as the cause of abnormal reaction of the patient, or of later complication, without mention of misadventure at the time of the procedure: Secondary | ICD-10-CM | POA: Diagnosis present

## 2019-08-12 DIAGNOSIS — Z8679 Personal history of other diseases of the circulatory system: Secondary | ICD-10-CM

## 2019-08-12 DIAGNOSIS — I70213 Atherosclerosis of native arteries of extremities with intermittent claudication, bilateral legs: Secondary | ICD-10-CM | POA: Diagnosis present

## 2019-08-12 DIAGNOSIS — I739 Peripheral vascular disease, unspecified: Secondary | ICD-10-CM | POA: Diagnosis present

## 2019-08-12 DIAGNOSIS — F17210 Nicotine dependence, cigarettes, uncomplicated: Secondary | ICD-10-CM | POA: Diagnosis present

## 2019-08-12 DIAGNOSIS — Z7982 Long term (current) use of aspirin: Secondary | ICD-10-CM | POA: Diagnosis not present

## 2019-08-12 DIAGNOSIS — Z83438 Family history of other disorder of lipoprotein metabolism and other lipidemia: Secondary | ICD-10-CM

## 2019-08-12 DIAGNOSIS — Z79899 Other long term (current) drug therapy: Secondary | ICD-10-CM

## 2019-08-12 DIAGNOSIS — I10 Essential (primary) hypertension: Secondary | ICD-10-CM | POA: Diagnosis present

## 2019-08-12 DIAGNOSIS — E785 Hyperlipidemia, unspecified: Secondary | ICD-10-CM | POA: Diagnosis present

## 2019-08-12 DIAGNOSIS — I9789 Other postprocedural complications and disorders of the circulatory system, not elsewhere classified: Secondary | ICD-10-CM | POA: Diagnosis present

## 2019-08-12 DIAGNOSIS — Z8249 Family history of ischemic heart disease and other diseases of the circulatory system: Secondary | ICD-10-CM

## 2019-08-12 DIAGNOSIS — Z833 Family history of diabetes mellitus: Secondary | ICD-10-CM | POA: Diagnosis not present

## 2019-08-12 HISTORY — PX: ENDOVASCULAR STENT GRAFT (AAA): CATH118280

## 2019-08-12 LAB — MRSA PCR SCREENING: MRSA by PCR: NEGATIVE

## 2019-08-12 LAB — GLUCOSE, CAPILLARY: Glucose-Capillary: 105 mg/dL — ABNORMAL HIGH (ref 70–99)

## 2019-08-12 LAB — ABO/RH: ABO/RH(D): A POS

## 2019-08-12 SURGERY — ENDOVASCULAR STENT GRAFT (AAA)
Anesthesia: General

## 2019-08-12 MED ORDER — ONDANSETRON HCL 4 MG/2ML IJ SOLN: 4.0000 mg | Freq: Four times a day (QID) | INTRAMUSCULAR | Status: DC | PRN

## 2019-08-12 MED ORDER — LIDOCAINE HCL (CARDIAC) PF 100 MG/5ML IV SOSY
PREFILLED_SYRINGE | INTRAVENOUS | Status: DC | PRN
  Administered 2019-08-12: 100 mg via INTRAVENOUS

## 2019-08-12 MED ORDER — FENTANYL CITRATE (PF) 100 MCG/2ML IJ SOLN
INTRAMUSCULAR | Status: AC
  Administered 2019-08-12: 25 ug via INTRAVENOUS
  Filled 2019-08-12: qty 2

## 2019-08-12 MED ORDER — VARENICLINE TARTRATE 1 MG PO TABS
1.0000 mg | ORAL_TABLET | Freq: Two times a day (BID) | ORAL | Status: DC
  Administered 2019-08-12 – 2019-08-13 (×2): 1 mg via ORAL
  Filled 2019-08-12 (×3): qty 1

## 2019-08-12 MED ORDER — ROCURONIUM BROMIDE 10 MG/ML (PF) SYRINGE
PREFILLED_SYRINGE | INTRAVENOUS | Status: AC
  Filled 2019-08-12: qty 10

## 2019-08-12 MED ORDER — LACTATED RINGERS IV SOLN: INTRAVENOUS | Status: DC

## 2019-08-12 MED ORDER — METOPROLOL TARTRATE 5 MG/5ML IV SOLN: 2.0000 mg | INTRAVENOUS | Status: DC | PRN

## 2019-08-12 MED ORDER — MIDAZOLAM HCL 2 MG/ML PO SYRP
ORAL_SOLUTION | ORAL | Status: AC
  Filled 2019-08-12: qty 4

## 2019-08-12 MED ORDER — FLUTICASONE PROPIONATE 50 MCG/ACT NA SUSP
1.0000 | Freq: Every day | NASAL | Status: DC
  Administered 2019-08-13: 1 via NASAL
  Filled 2019-08-12: qty 16

## 2019-08-12 MED ORDER — DOPAMINE-DEXTROSE 3.2-5 MG/ML-% IV SOLN: 3.0000 ug/kg/min | INTRAVENOUS | Status: DC

## 2019-08-12 MED ORDER — ONDANSETRON HCL 4 MG/2ML IJ SOLN: 4.0000 mg | Freq: Once | INTRAMUSCULAR | Status: DC | PRN

## 2019-08-12 MED ORDER — CHLORHEXIDINE GLUCONATE CLOTH 2 % EX PADS
6.0000 | MEDICATED_PAD | Freq: Once | CUTANEOUS | Status: AC
  Administered 2019-08-12: 6 via TOPICAL

## 2019-08-12 MED ORDER — MIDAZOLAM HCL 2 MG/ML PO SYRP
8.0000 mg | ORAL_SOLUTION | Freq: Once | ORAL | Status: AC
  Administered 2019-08-12: 8 mg via ORAL

## 2019-08-12 MED ORDER — SODIUM CHLORIDE 0.9 % IV SOLN: INTRAVENOUS | Status: DC

## 2019-08-12 MED ORDER — MIDAZOLAM HCL 2 MG/2ML IJ SOLN
INTRAMUSCULAR | Status: AC
  Filled 2019-08-12: qty 2

## 2019-08-12 MED ORDER — LABETALOL HCL 5 MG/ML IV SOLN
10.0000 mg | INTRAVENOUS | Status: DC | PRN
  Administered 2019-08-13 (×3): 10 mg via INTRAVENOUS
  Filled 2019-08-12 (×3): qty 4

## 2019-08-12 MED ORDER — CLOPIDOGREL BISULFATE 75 MG PO TABS
75.0000 mg | ORAL_TABLET | Freq: Every day | ORAL | Status: DC
  Administered 2019-08-13: 75 mg via ORAL
  Filled 2019-08-12: qty 1

## 2019-08-12 MED ORDER — ACETAMINOPHEN 650 MG RE SUPP: 325.0000 mg | RECTAL | Status: DC | PRN

## 2019-08-12 MED ORDER — FENTANYL CITRATE (PF) 100 MCG/2ML IJ SOLN
INTRAMUSCULAR | Status: AC
  Filled 2019-08-12: qty 2

## 2019-08-12 MED ORDER — DEXMEDETOMIDINE HCL IN NACL 200 MCG/50ML IV SOLN
INTRAVENOUS | Status: AC
  Filled 2019-08-12: qty 50

## 2019-08-12 MED ORDER — DEXAMETHASONE SODIUM PHOSPHATE 10 MG/ML IJ SOLN
INTRAMUSCULAR | Status: DC | PRN
  Administered 2019-08-12: 10 mg via INTRAVENOUS

## 2019-08-12 MED ORDER — ONDANSETRON HCL 4 MG/2ML IJ SOLN
INTRAMUSCULAR | Status: DC | PRN
  Administered 2019-08-12: 4 mg via INTRAVENOUS

## 2019-08-12 MED ORDER — FAMOTIDINE 20 MG PO TABS
20.0000 mg | ORAL_TABLET | Freq: Once | ORAL | Status: AC
  Administered 2019-08-12: 20 mg via ORAL

## 2019-08-12 MED ORDER — MIDAZOLAM HCL 2 MG/2ML IJ SOLN
INTRAMUSCULAR | Status: DC | PRN
  Administered 2019-08-12: 2 mg via INTRAVENOUS

## 2019-08-12 MED ORDER — MAGNESIUM SULFATE 2 GM/50ML IV SOLN: 2.0000 g | Freq: Every day | INTRAVENOUS | Status: DC | PRN

## 2019-08-12 MED ORDER — GUAIFENESIN-DM 100-10 MG/5ML PO SYRP: 15.0000 mL | ORAL_SOLUTION | ORAL | Status: DC | PRN

## 2019-08-12 MED ORDER — CEFAZOLIN SODIUM-DEXTROSE 2-4 GM/100ML-% IV SOLN
2.0000 g | Freq: Three times a day (TID) | INTRAVENOUS | Status: AC
  Administered 2019-08-12 – 2019-08-13 (×2): 2 g via INTRAVENOUS
  Filled 2019-08-12 (×2): qty 100

## 2019-08-12 MED ORDER — PROPOFOL 10 MG/ML IV BOLUS
INTRAVENOUS | Status: DC | PRN
  Administered 2019-08-12: 50 mg via INTRAVENOUS

## 2019-08-12 MED ORDER — NITROGLYCERIN IN D5W 200-5 MCG/ML-% IV SOLN: 5.0000 ug/min | INTRAVENOUS | Status: DC

## 2019-08-12 MED ORDER — ORAL CARE MOUTH RINSE: 15.0000 mL | Freq: Once | OROMUCOSAL | Status: DC

## 2019-08-12 MED ORDER — HYDROMORPHONE HCL 1 MG/ML IJ SOLN: 1.0000 mg | Freq: Once | INTRAMUSCULAR | Status: DC | PRN

## 2019-08-12 MED ORDER — POTASSIUM CHLORIDE CRYS ER 20 MEQ PO TBCR: 20.0000 meq | EXTENDED_RELEASE_TABLET | Freq: Every day | ORAL | Status: DC | PRN

## 2019-08-12 MED ORDER — FAMOTIDINE 20 MG PO TABS
ORAL_TABLET | ORAL | Status: AC
  Filled 2019-08-12: qty 1

## 2019-08-12 MED ORDER — SODIUM CHLORIDE 0.9 % IV SOLN: 500.0000 mL | Freq: Once | INTRAVENOUS | Status: DC | PRN

## 2019-08-12 MED ORDER — HEPARIN SODIUM (PORCINE) 1000 UNIT/ML IJ SOLN
INTRAMUSCULAR | Status: AC
  Filled 2019-08-12: qty 1

## 2019-08-12 MED ORDER — ASPIRIN EC 81 MG PO TBEC
81.0000 mg | DELAYED_RELEASE_TABLET | Freq: Every day | ORAL | Status: DC
  Administered 2019-08-12 – 2019-08-13 (×2): 81 mg via ORAL
  Filled 2019-08-12 (×2): qty 1

## 2019-08-12 MED ORDER — CEFAZOLIN SODIUM-DEXTROSE 2-4 GM/100ML-% IV SOLN
2.0000 g | INTRAVENOUS | Status: AC
  Administered 2019-08-12: 2 g via INTRAVENOUS

## 2019-08-12 MED ORDER — CHLORHEXIDINE GLUCONATE 0.12 % MT SOLN
15.0000 mL | Freq: Once | OROMUCOSAL | Status: DC
  Filled 2019-08-12: qty 15

## 2019-08-12 MED ORDER — FENTANYL CITRATE (PF) 100 MCG/2ML IJ SOLN
25.0000 ug | INTRAMUSCULAR | Status: DC | PRN
  Administered 2019-08-12: 25 ug via INTRAVENOUS

## 2019-08-12 MED ORDER — HEPARIN SODIUM (PORCINE) 1000 UNIT/ML IJ SOLN
INTRAMUSCULAR | Status: DC | PRN
Start: 2019-08-12 — End: 2019-08-12
  Administered 2019-08-12: 5000 [IU] via INTRAVENOUS

## 2019-08-12 MED ORDER — DOCUSATE SODIUM 100 MG PO CAPS
100.0000 mg | ORAL_CAPSULE | Freq: Every day | ORAL | Status: DC
  Filled 2019-08-12: qty 1

## 2019-08-12 MED ORDER — IODIXANOL 320 MG/ML IV SOLN
INTRAVENOUS | Status: DC | PRN
  Administered 2019-08-12: 105 mL

## 2019-08-12 MED ORDER — FENTANYL CITRATE (PF) 100 MCG/2ML IJ SOLN
INTRAMUSCULAR | Status: DC | PRN
  Administered 2019-08-12: 100 ug via INTRAVENOUS

## 2019-08-12 MED ORDER — CHLORHEXIDINE GLUCONATE CLOTH 2 % EX PADS: 6.0000 | MEDICATED_PAD | Freq: Once | CUTANEOUS | Status: DC

## 2019-08-12 MED ORDER — PHENOL 1.4 % MT LIQD
1.0000 | OROMUCOSAL | Status: DC | PRN
  Filled 2019-08-12: qty 177

## 2019-08-12 MED ORDER — MIDAZOLAM HCL 2 MG/ML PO SYRP: 8.0000 mg | ORAL_SOLUTION | Freq: Once | ORAL | Status: DC | PRN

## 2019-08-12 MED ORDER — SODIUM CHLORIDE 0.9 % IV SOLN
INTRAVENOUS | Status: DC | PRN
  Administered 2019-08-12: 25 ug/min via INTRAVENOUS

## 2019-08-12 MED ORDER — PHENYLEPHRINE HCL (PRESSORS) 10 MG/ML IV SOLN
INTRAVENOUS | Status: DC | PRN
  Administered 2019-08-12 (×3): 100 ug via INTRAVENOUS

## 2019-08-12 MED ORDER — ACETAMINOPHEN 325 MG PO TABS: 325.0000 mg | ORAL_TABLET | ORAL | Status: DC | PRN

## 2019-08-12 MED ORDER — OXYCODONE-ACETAMINOPHEN 5-325 MG PO TABS
1.0000 | ORAL_TABLET | ORAL | Status: DC | PRN
  Administered 2019-08-13: 1 via ORAL
  Filled 2019-08-12: qty 1

## 2019-08-12 MED ORDER — LORATADINE 10 MG PO TABS
10.0000 mg | ORAL_TABLET | Freq: Every day | ORAL | Status: DC
  Filled 2019-08-12: qty 1

## 2019-08-12 MED ORDER — ROCURONIUM BROMIDE 100 MG/10ML IV SOLN
INTRAVENOUS | Status: DC | PRN
  Administered 2019-08-12: 30 mg via INTRAVENOUS
  Administered 2019-08-12: 40 mg via INTRAVENOUS
  Administered 2019-08-12: 30 mg via INTRAVENOUS

## 2019-08-12 MED ORDER — HYDRALAZINE HCL 20 MG/ML IJ SOLN
5.0000 mg | INTRAMUSCULAR | Status: DC | PRN
  Administered 2019-08-12: 5 mg via INTRAVENOUS
  Filled 2019-08-12: qty 1

## 2019-08-12 MED ORDER — DEXMEDETOMIDINE HCL 200 MCG/2ML IV SOLN
INTRAVENOUS | Status: DC | PRN
  Administered 2019-08-12: 20 ug via INTRAVENOUS

## 2019-08-12 MED ORDER — MORPHINE SULFATE (PF) 2 MG/ML IV SOLN
2.0000 mg | INTRAVENOUS | Status: DC | PRN
  Administered 2019-08-12 – 2019-08-13 (×4): 2 mg via INTRAVENOUS
  Filled 2019-08-12 (×4): qty 1

## 2019-08-12 MED ORDER — ALUM & MAG HYDROXIDE-SIMETH 200-200-20 MG/5ML PO SUSP: 15.0000 mL | ORAL | Status: DC | PRN

## 2019-08-12 MED ORDER — METOPROLOL SUCCINATE ER 50 MG PO TB24
100.0000 mg | ORAL_TABLET | Freq: Every day | ORAL | Status: DC
  Administered 2019-08-12: 100 mg via ORAL
  Filled 2019-08-12 (×2): qty 2

## 2019-08-12 MED ORDER — FAMOTIDINE IN NACL 20-0.9 MG/50ML-% IV SOLN
20.0000 mg | Freq: Two times a day (BID) | INTRAVENOUS | Status: DC
  Administered 2019-08-12 – 2019-08-13 (×2): 20 mg via INTRAVENOUS
  Filled 2019-08-12 (×2): qty 50

## 2019-08-12 SURGICAL SUPPLY — 58 items
ADH SKN CLS APL DERMABOND .7 (GAUZE/BANDAGES/DRESSINGS) ×1
BALLN LUTONIX 7X80X130 (BALLOONS) ×3
BALLN LUTONIX DCB 7X40X130 (BALLOONS) ×3
BALLOON LUTONIX 7X80X130 (BALLOONS) IMPLANT
BALLOON LUTONIX DCB 7X40X130 (BALLOONS) IMPLANT
CATH ACCU-VU SIZ PIG 5F 70CM (CATHETERS) ×2 IMPLANT
CATH BALLN CODA 9X100X32 (BALLOONS) ×2 IMPLANT
CATH BEACON 5 .035 65 KMP TIP (CATHETERS) ×2 IMPLANT
DERMABOND ADVANCED (GAUZE/BANDAGES/DRESSINGS) ×2
DERMABOND ADVANCED .7 DNX12 (GAUZE/BANDAGES/DRESSINGS) IMPLANT
DEVICE CLOSURE PERCLS PRGLD 6F (VASCULAR PRODUCTS) IMPLANT
DEVICE PRESTO INFLATION (MISCELLANEOUS) ×5 IMPLANT
DEVICE SAFEGUARD 24CM (GAUZE/BANDAGES/DRESSINGS) ×4 IMPLANT
DEVICE TORQUE .025-.038 (MISCELLANEOUS) ×2 IMPLANT
DRYSEAL FLEXSHEATH 12FR 33CM (SHEATH) ×2
DRYSEAL FLEXSHEATH 16FR 33CM (SHEATH) ×2
EXCLUDER TNK LEG 23MX12X18 (Endovascular Graft) IMPLANT
EXCLUDER TRUNK LEG 23MX12X18 (Endovascular Graft) ×3 IMPLANT
GLIDEWIRE STIFF .35X180X3 HYDR (WIRE) ×2 IMPLANT
GLOVE BIO SURGEON STRL SZ7 (GLOVE) ×2 IMPLANT
GLOVE SURG SYN 8.0 (GLOVE) ×3 IMPLANT
GLOVE SURG SYN 8.0 PF PI (GLOVE) IMPLANT
GOWN STRL REUS W/ TWL LRG LVL3 (GOWN DISPOSABLE) IMPLANT
GOWN STRL REUS W/ TWL XL LVL3 (GOWN DISPOSABLE) IMPLANT
GOWN STRL REUS W/TWL LRG LVL3 (GOWN DISPOSABLE) ×6
GOWN STRL REUS W/TWL XL LVL3 (GOWN DISPOSABLE) ×6
LEG CONTRALATERAL 16X12X10 (Vascular Products) ×3 IMPLANT
NDL ENTRY 21GA 7CM ECHOTIP (NEEDLE) IMPLANT
NEEDLE ENTRY 21GA 7CM ECHOTIP (NEEDLE) ×3 IMPLANT
PACK ANGIOGRAPHY (CUSTOM PROCEDURE TRAY) ×2 IMPLANT
PACK BASIN MAJOR (MISCELLANEOUS) ×2 IMPLANT
PERCLOSE PROGLIDE 6F (VASCULAR PRODUCTS) ×18
SET INTRO CAPELLA COAXIAL (SET/KITS/TRAYS/PACK) ×2 IMPLANT
SHEATH BRITE TIP 6FRX11 (SHEATH) ×4 IMPLANT
SHEATH BRITE TIP 8FRX11 (SHEATH) ×4 IMPLANT
SHEATH DRYSEAL FLEX 12FR 33CM (SHEATH) IMPLANT
SHEATH DRYSEAL FLEX 16FR 33CM (SHEATH) IMPLANT
SPONGE XRAY 4X4 16PLY STRL (MISCELLANEOUS) ×4 IMPLANT
STENT GRAFT CONTRALAT 16X12X10 (Vascular Products) IMPLANT
STENT LIFESTAR 9X40 (Permanent Stent) ×2 IMPLANT
STENT LIFESTAR 9X80 (Permanent Stent) ×2 IMPLANT
SUT MNCRL 4-0 (SUTURE) ×6
SUT MNCRL 4-0 27XMFL (SUTURE) ×2
SUT PROLENE 5 0 RB 1 DA (SUTURE) ×8 IMPLANT
SUT PROLENE 6 0 BV (SUTURE) ×20 IMPLANT
SUT SILK 2 0 (SUTURE) ×3
SUT SILK 2-0 18XBRD TIE 12 (SUTURE) IMPLANT
SUT SILK 3 0 (SUTURE) ×3
SUT SILK 3-0 18XBRD TIE 12 (SUTURE) IMPLANT
SUT SILK 4 0 (SUTURE) ×3
SUT SILK 4-0 18XBRD TIE 12 (SUTURE) IMPLANT
SUT VIC AB 2-0 CT1 (SUTURE) ×14 IMPLANT
SUT VICRYL+ 3-0 36IN CT-1 (SUTURE) ×12 IMPLANT
SUTURE MNCRL 4-0 27XMF (SUTURE) IMPLANT
SYR MEDRAD MARK 7 150ML (SYRINGE) ×2 IMPLANT
TUBING CONTRAST HIGH PRESS 72 (TUBING) ×2 IMPLANT
WIRE AMPLATZ SSTIFF .035X260CM (WIRE) ×4 IMPLANT
WIRE J 3MM .035X145CM (WIRE) ×4 IMPLANT

## 2019-08-12 NOTE — Op Note (Signed)
  OPERATIVE NOTE   PROCEDURE: 1. US guidance for vascular access, bilateral femoral arteries 2. Catheter placement into aorta from bilateral femoral approaches 3. Placement of a 23 x 12 x 18 C3 Gore Excluder Endoprosthesis main body with a 12 x 10 contralateral limb 4. Placement of a 9 x 80 life star stent right external iliac artery for treatment of occlusive disease. 5. Placement of a 9 x 40 life star stent distal left external iliac artery for treatment of a residual lesion. 6. ProGlide closure devices bilateral femoral arteries  PRE-OPERATIVE DIAGNOSIS: AAA; atherosclerotic occlusive disease bilateral lower extremities with lifestyle limiting claudication bilaterally associated with mild rest pain  POST-OPERATIVE DIAGNOSIS: same  SURGEON: Gregory Schnier, MD and Jason Dew, MD - Co-surgeons  ANESTHESIA: general  ESTIMATED BLOOD LOSS: 50 cc  FINDING(S): 1.  AAA; greater than 70% diameter reduction left external iliac artery greater than 80% diameter reduction right external iliac artery  SPECIMEN(S):  none  INDICATIONS:   Antonio Russell is a 59 y.o. y.o. male who presents with increasing leg pain.  He also has an abdominal aortic aneurysm associated with his atherosclerotic occlusive disease.  He now requires treatment and is undergoing endovascular repair which we will treat both of these issues simultaneously.  Risks and benefits of been reviewed all questions were answered patient has agreed to proceed.  DESCRIPTION: After obtaining full informed written consent, the patient was brought back to the operating room and placed supine upon the operating table.  The patient received IV antibiotics prior to induction.  After obtaining adequate anesthesia, the patient was prepped and draped in the standard fashion for endovascular AAA repair.  Co-surgeons are required because this is a complex bilateral procedure with work being performed simultaneously from both the right femoral and  left femoral approach.  This also expedites the procedure making a shorter operative time reducing complications and improving patient safety.  We then began by gaining access to both femoral arteries with US guidance with me working on the the patient's left and Dr. Dew working on the patient's right.  The femoral arteries were found to be patent and accessed without difficulty with a needle under ultrasound guidance without difficulty on each side and permanent images were recorded.  We then placed 2 proglide devices on each side in a pre-close fashion and placed 8 French sheaths.  The patient was then given 5000 units of intravenous heparin.   The Pigtail catheter was placed into the aorta from the right side. Using this image, we selected a 23 x 12 x 18 C3 Main body device.  Over a stiff wire, an 16 French sheath was placed. The main body was then placed through the 16 French sheath. A Kumpe catheter was placed up the right side and a magnified image at the renal arteries was performed. The main body was then deployed just below the lowest renal artery. The Kumpe catheter was used to cannulate the contralateral gate without difficulty and successful cannulation was confirmed by twirling the pigtail catheter in the main body. We then placed a stiff wire and a retrograde arteriogram was performed through the right femoral sheath. We upsized to the 12 French sheath for the contralateral limb and a 12 x 10 limb was selected and deployed. The main body deployment was then completed. Based off the angiographic findings, extension limbs were not necessary.  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    This required treatment both to eliminate the patient's preoperative complaints but also to ensure that the aortic endograft would not thrombose.  On the right there was a greater than 80% stenosis beginning at the origin  of the external and extending almost to the midpoint of the external iliac artery.  A 9 mm x 80 mm life star stent was then deployed across this lesion and postdilated with a 7 mm x 80 mm Lutonix drug-eluting balloon.  Follow-up imaging demonstrated less than 10% residual stenosis within the external iliac artery.  As noted above retrograde imaging on the left side also demonstrated a high-grade stricture that was hemodynamically significant and a 9 mm x 40 mm life star stent was deployed across this region postdilated with a 7 mm x 40 mm Lutonix drug-eluting balloon.  Both of the Lutonix inflations were to 8 to 10 atm for approximately 1 minute.  Follow-up imaging by hand-injection on the left now demonstrated resolution of this stricture as well with less than 10% residual stenosis.  All junction points and seals zones were treated with the compliant balloon.   The pigtail catheter was then replaced and a completion angiogram was performed.   Type II endoleak was detected on completion angiography. The renal arteries were found to be widely patent.  External iliac lesions were found to be well treated.   At this point we elected to terminate the procedure. We secured the pro glide devices for hemostasis on the femoral arteries. The skin incision was closed with a 4-0 Monocryl. Dermabond and pressure dressing were placed. The patient was taken to the recovery room in stable condition having tolerated the procedure well.  COMPLICATIONS: none  CONDITION: stable  Gregory Schnier  08/12/2019, 5:07 PM    

## 2019-08-12 NOTE — H&P (Signed)
[facility] History & Physical Update  The patient was interviewed and re-examined.  The patient's previous History and Physical has been reviewed and is unchanged.  There is no change in the plan of care. We plan to proceed with the scheduled procedure.  [name], MD  08/12/2019, 2:58 PM

## 2019-08-12 NOTE — Transfer of Care (Signed)
Immediate Anesthesia Transfer of Care Note  Patient: Antonio Russell  Procedure(s) Performed: ENDOVASCULAR REPAIR/STENT GRAFT (N/A )  Patient Location: PACU  Anesthesia Type:General  Level of Consciousness: drowsy and patient cooperative  Airway & Oxygen Therapy: Patient Spontanous Breathing  Post-op Assessment: Report given to RN and Post -op Vital signs reviewed and stable  Post vital signs: Reviewed and stable  Last Vitals:  Vitals Value Taken Time  BP 153/96 08/12/19 1717  Temp 36.1 C 08/12/19 1717  Pulse 65 08/12/19 1719  Resp 18 08/12/19 1719  SpO2 97 % 08/12/19 1719  Vitals shown include unvalidated device data.  Last Pain:  Vitals:   08/12/19 1130  TempSrc: Oral  PainSc: 0-No pain         Complications: No apparent anesthesia complications

## 2019-08-12 NOTE — Anesthesia Preprocedure Evaluation (Signed)
Anesthesia Evaluation  Patient identified by MRN, date of birth, ID band Patient awake    Reviewed: Allergy & Precautions, NPO status , Patient's Chart, lab work & pertinent test results  History of Anesthesia Complications Negative for: history of anesthetic complications  Airway Mallampati: II  TM Distance: >3 FB Neck ROM: Full    Dental  (+) Poor Dentition   Pulmonary sleep apnea (noncompliant with CPAP) , neg COPD, Current Smoker and Patient abstained from smoking.,    breath sounds clear to auscultation- rhonchi (-) wheezing      Cardiovascular Exercise Tolerance: Good hypertension, Pt. on medications + CAD (nonocclusive) and + Peripheral Vascular Disease  (-) Past MI, (-) Cardiac Stents and (-) CABG  Rhythm:Regular Rate:Normal - Systolic murmurs and - Diastolic murmurs    Neuro/Psych  Headaches, neg Seizures Anxiety    GI/Hepatic Neg liver ROS, GERD  ,  Endo/Other  negative endocrine ROSneg diabetes  Renal/GU negative Renal ROS     Musculoskeletal  (+) Arthritis ,   Abdominal (+) - obese,   Peds  Hematology negative hematology ROS (+)   Anesthesia Other Findings Past Medical History: No date: Allergic rhinitis No date: Allergy No date: Anxiety No date: Arthritis No date: Back pain 2012: Chest pain     Comment:  cardiac cath - nonobstructive, nl LV fxn, rec aggressive              med management No date: GERD (gastroesophageal reflux disease) No date: Headache(784.0) No date: HLD (hyperlipidemia) No date: Hyperparathyroidism ([place]) No date: Hypertension 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 No date: Personal history of urinary calculi No date: Sleep apnea No date: Tobacco use disorder No date: Unspecified hearing loss   Reproductive/Obstetrics                             Anesthesia Physical Anesthesia Plan  ASA: III  Anesthesia  Plan: General   Post-op Pain Management:    Induction: Intravenous  PONV Risk Score and Plan: 0 and Ondansetron  Airway Management Planned: Oral ETT  Additional Equipment: Arterial line  Intra-op Plan:   Post-operative Plan: Extubation in OR  Informed Consent: I have reviewed the patients History and Physical, chart, labs and discussed the procedure including the risks, benefits and alternatives for the proposed anesthesia with the patient or authorized representative who has indicated his/her understanding and acceptance.     Dental advisory given  Plan Discussed with: CRNA and Anesthesiologist  Anesthesia Plan Comments:         Anesthesia Quick Evaluation

## 2019-08-12 NOTE — Anesthesia Procedure Notes (Signed)
Procedure Name: Intubation Date/Time: 08/12/2019 3:05 PM Performed by: Jonna Clark, CRNA Pre-anesthesia Checklist: Patient identified, Patient being monitored, Timeout performed, Emergency Drugs available and Suction available Patient Re-evaluated:Patient Re-evaluated prior to induction Oxygen Delivery Method: Circle system utilized Preoxygenation: Pre-oxygenation with 100% oxygen Induction Type: IV induction Ventilation: Mask ventilation without difficulty Laryngoscope Size: McGraph and 4 Grade View: Grade I Tube type: Oral Tube size: 7.5 mm Number of attempts: 1 Airway Equipment and Method: Stylet Placement Confirmation: ETT inserted through vocal cords under direct vision,  positive ETCO2 and breath sounds checked- equal and bilateral Secured at: 23 cm Tube secured with: Tape Dental Injury: Teeth and Oropharynx as per pre-operative assessment

## 2019-08-12 NOTE — Op Note (Signed)
OPERATIVE NOTE   PROCEDURE: 1. US guidance for vascular access, bilateral femoral arteries 2. Catheter placement into aorta from bilateral femoral approaches 3. Placement of a 23 mm proximal, 12 cm distal 18 cm length Gore Excluder Endoprosthesis main body left down to the left external iliac artery with a 12 mm x 10 cm right contralateral limb 4. Placement of a 9 mm x 8 cm right external iliac stent for occlusive disease 5. Placement of a 9 mm x 4 cm left external iliac stent for occlusive disease 6. ProGlide closure devices bilateral femoral arteries  PRE-OPERATIVE DIAGNOSIS: AAA, PAD with claudication BLE  POST-OPERATIVE DIAGNOSIS: same  SURGEON: Antonio Pain, MD and Hortencia Pilar, MD - Co-surgeons  ANESTHESIA: general  ESTIMATED BLOOD LOSS: 25 cc  FINDING(S): 1.  AAA 2.  >60% bilateral external iliac artery stenosis  SPECIMEN(S):  none  INDICATIONS:   Antonio Russell is a 59 y.o. male who presents with lifestyle limiting claudication and a >4 cm AAA. The anatomy was suitable for endovascular repair and concomitant treatment of his occlusive disease.  Risks and benefits of repair in an endovascular fashion were discussed and informed consent was obtained. Co-surgeons are used to expedite the procedure and reduce operative time as bilateral work needs to be done.  DESCRIPTION: After obtaining full informed written consent, the patient was brought back to the operating room and placed supine upon the operating table.  The patient received IV antibiotics prior to induction.  After obtaining adequate anesthesia, the patient was prepped and draped in the standard fashion for endovascular AAA repair.  We then began by gaining access to both femoral arteries with US guidance with me working on the right and Dr. Delana Meyer working on the left.  The femoral arteries were found to be patent and accessed without difficulty with a needle under ultrasound guidance without difficulty on each side  and permanent images were recorded.  We then placed 2 proglide devices on each side in a pre-close fashion and placed 8 French sheaths. The patient was then given 5000 units of intravenous heparin. The Pigtail catheter was placed into the aorta from the right side. Using this image, we selected a 23 mm proximal, 12 cm distal 18 cm length Main body device.  Over a stiff wire, an 16 French sheath was placed up the left side. The main body was then placed through the 16 French sheath. A Kumpe catheter was placed up the right side and a magnified image at the renal arteries was performed. The main body was then deployed just below the lowest renal artery which was the left. The Kumpe catheter was used to cannulate the contralateral gate without difficulty and successful cannulation was confirmed by twirling the pigtail catheter in the main body. We then placed a stiff wire and a retrograde arteriogram was performed through the right femoral sheath. We upsized to the 12 [place] sheath for the contralateral limb and a 12 mm x 10 cm length right iliac limb was selected and deployed. The main body deployment was then completed. This was intentionally taken down into the external iliac artery to extend beyond the occluded hypogastric artery into the external iliac artery. Based off the angiographic findings, we proceeded with treatment of the bilateral external iliac occlusive disease.  On the right, an 9 mm x 8 cm length lifestar stent was deployed bridging from the stent graft down to the mid to distal right external iliac artery to encompass the >60% lesions in  the right external iliac artery.  On the left, a 9 mm x 4 cm lifestar stent was deployed from the stent graft in the external iliac artery proximally down to the mid to distal external iliac artery to treat the residual occlusive disease.  Both lifestar stents were post dilated with 7 mm Lutonix drug coated balloons with <20% residual stenosis. All junction points  and seals zones were treated with the compliant balloon. The pigtail catheter was then replaced and a completion angiogram was performed.  A small type 2 Endoleak was detected on completion angiography. The renal arteries were found to be widely patent. The right hypogastric artery remained widely patent.  The iliac occlusive disease had been successfully treated. At this point we elected to terminate the procedure. We secured the pro glide devices for hemostasis on the femoral arteries. The skin incision was closed with a 4-0 Monocryl. Dermabond and pressure dressing were placed. The patient was taken to the recovery room in stable condition having tolerated the procedure well.  COMPLICATIONS: none  CONDITION: stable  Antonio Russell  08/12/2019, 5:36 PM   This note was created with Dragon Medical transcription system. Any errors in dictation are purely unintentional.

## 2019-08-13 ENCOUNTER — Encounter: Payer: Self-pay | Admitting: Cardiology

## 2019-08-13 DIAGNOSIS — I714 Abdominal aortic aneurysm, without rupture: Principal | ICD-10-CM

## 2019-08-13 LAB — BASIC METABOLIC PANEL
Anion gap: 9 (ref 5–15)
BUN: 19 mg/dL (ref 6–20)
CO2: 22 mmol/L (ref 22–32)
Calcium: 10.6 mg/dL — ABNORMAL HIGH (ref 8.9–10.3)
Chloride: 103 mmol/L (ref 98–111)
Creatinine, Ser: 0.97 mg/dL (ref 0.61–1.24)
GFR calc Af Amer: >60 mL/min (ref >60)
GFR calc non Af Amer: >60 mL/min (ref >60)
Glucose, Bld: 145 mg/dL — ABNORMAL HIGH (ref 70–99)
Potassium: 4.6 mmol/L (ref 3.5–5.1)
Sodium: 134 mmol/L — ABNORMAL LOW (ref 135–145)

## 2019-08-13 LAB — CBC
HCT: 42.4 % (ref 39.0–52.0)
Hemoglobin: 14.8 g/dL (ref 13.0–17.0)
MCH: 33.3 pg (ref 26.0–34.0)
MCHC: 34.9 g/dL (ref 30.0–36.0)
MCV: 95.5 fL (ref 80.0–100.0)
Platelets: 226 10*3/uL (ref 150–400)
RBC: 4.44 MIL/uL (ref 4.22–5.81)
RDW: 12.3 % (ref 11.5–15.5)
WBC: 22.8 10*3/uL — ABNORMAL HIGH (ref 4.0–10.5)
nRBC: 0 % (ref 0.0–0.2)

## 2019-08-13 MED ORDER — OXYCODONE-ACETAMINOPHEN 5-325 MG PO TABS: 1.0000 | ORAL_TABLET | Freq: Four times a day (QID) | ORAL | 0 refills | Status: DC | PRN

## 2019-08-13 MED ORDER — KETOROLAC TROMETHAMINE 30 MG/ML IJ SOLN
30.0000 mg | Freq: Four times a day (QID) | INTRAMUSCULAR | Status: DC
  Administered 2019-08-13: 30 mg via INTRAVENOUS
  Filled 2019-08-13: qty 1

## 2019-08-13 MED ORDER — CLOPIDOGREL BISULFATE 75 MG PO TABS: 75.0000 mg | ORAL_TABLET | Freq: Every day | ORAL | 1 refills | Status: DC

## 2019-08-13 NOTE — Discharge Instructions (Signed)
Vascular Surgery Discharge Instructions  1) You may shower as of tomorrow.  Keep your groins clean and dry. 2) No heavy lifting greater than 10 pounds or engaging in strenuous activity for at least 2 weeks 3) No driving while on pain medication

## 2019-08-13 NOTE — Progress Notes (Addendum)
Shift summary:  - Patient ambulating in room without difficulty. - Diet advanced per order per patient request. - Trialing PO pain meds instead of IV. - PADs (bilat) removed by PA at 0945 hrs this AM.

## 2019-08-13 NOTE — Plan of Care (Signed)
  Problem: Education: Goal: Knowledge of General Education information will improve Description: Including pain rating scale, medication(s)/side effects and non-pharmacologic comfort measures 08/13/2019 1150 by Jesse Sans, RN Outcome: Adequate for Discharge 08/13/2019 0919 by Jesse Sans, RN Outcome: Progressing   Problem: Health Behavior/Discharge Planning: Goal: Ability to manage health-related needs will improve 08/13/2019 1150 by Jesse Sans, RN Outcome: Adequate for Discharge 08/13/2019 0919 by Jesse Sans, RN Outcome: Progressing   Problem: Clinical Measurements: Goal: Ability to maintain clinical measurements within normal limits will improve 08/13/2019 1150 by Jesse Sans, RN Outcome: Adequate for Discharge 08/13/2019 0919 by Jesse Sans, RN Outcome: Progressing Goal: Will remain free from infection 08/13/2019 1150 by Jesse Sans, RN Outcome: Adequate for Discharge 08/13/2019 0919 by Jesse Sans, RN Outcome: Progressing Goal: Diagnostic test results will improve 08/13/2019 1150 by Jesse Sans, RN Outcome: Adequate for Discharge 08/13/2019 0919 by Jesse Sans, RN Outcome: Progressing Goal: Respiratory complications will improve 08/13/2019 1150 by Jesse Sans, RN Outcome: Adequate for Discharge 08/13/2019 0919 by Jesse Sans, RN Outcome: Progressing Goal: Cardiovascular complication will be avoided 08/13/2019 1150 by Jesse Sans, RN Outcome: Adequate for Discharge 08/13/2019 0919 by Jesse Sans, RN Outcome: Progressing   Problem: Activity: Goal: Risk for activity intolerance will decrease 08/13/2019 1150 by Jesse Sans, RN Outcome: Adequate for Discharge 08/13/2019 0919 by Jesse Sans, RN Outcome: Progressing   Problem: Nutrition: Goal: Adequate nutrition will be maintained 08/13/2019 1150 by Jesse Sans, RN Outcome: Adequate for Discharge 08/13/2019 0919 by Jesse Sans, RN Outcome: Progressing    Problem: Coping: Goal: Level of anxiety will decrease 08/13/2019 1150 by Jesse Sans, RN Outcome: Adequate for Discharge 08/13/2019 0919 by Jesse Sans, RN Outcome: Progressing   Problem: Elimination: Goal: Will not experience complications related to bowel motility 08/13/2019 1150 by Jesse Sans, RN Outcome: Adequate for Discharge 08/13/2019 0919 by Jesse Sans, RN Outcome: Progressing Goal: Will not experience complications related to urinary retention 08/13/2019 1150 by Jesse Sans, RN Outcome: Adequate for Discharge 08/13/2019 0919 by Jesse Sans, RN Outcome: Progressing   Problem: Pain Managment: Goal: General experience of comfort will improve 08/13/2019 1150 by Jesse Sans, RN Outcome: Adequate for Discharge 08/13/2019 0919 by Jesse Sans, RN Outcome: Progressing   Problem: Safety: Goal: Ability to remain free from injury will improve 08/13/2019 1150 by Jesse Sans, RN Outcome: Adequate for Discharge 08/13/2019 0919 by Jesse Sans, RN Outcome: Progressing   Problem: Skin Integrity: Goal: Risk for impaired skin integrity will decrease 08/13/2019 1150 by Jesse Sans, RN Outcome: Adequate for Discharge 08/13/2019 0919 by Jesse Sans, RN Outcome: Progressing

## 2019-08-13 NOTE — Discharge Summary (Signed)
[facility]    Discharge Summary  Patient ID:  Antonio Russell MRN: [id number] DOB/AGE: 1961-03-11 59 y.o.  Admit date: 08/12/2019 Discharge date: 08/13/2019 Date of Surgery: 08/12/2019 Surgeon: Surgeon(s): Algernon Huxley, MD  Admission Diagnosis: AAA (abdominal aortic aneurysm) without rupture [facility]) [I71.4]  Discharge Diagnoses:  AAA (abdominal aortic aneurysm) without rupture [facility]) [I71.4]  Secondary Diagnoses: Past Medical History:  Diagnosis Date  . Allergic rhinitis   . Allergy   . Anxiety   . Arthritis   . Back pain   . Chest pain 2012   cardiac cath - nonobstructive, nl LV fxn, rec aggressive med management  . GERD (gastroesophageal reflux disease)   . Headache(784.0)   . HLD (hyperlipidemia)   . Hyperparathyroidism ([place])   . Hypertension   . Impotence of organic origin   . Other testicular hypofunction   . Personal history of urinary calculi   . Sleep apnea   . Tobacco use disorder   . Unspecified hearing loss    Procedure(s): 08/12/19: 1. US guidance for vascular access, bilateral femoral arteries 2. Catheter placement into aorta from bilateral femoral approaches 3. Placement of a 23 mm proximal, 12 cm distal 18 cm length Gore Excluder Endoprosthesis main body left down to the left external iliac artery with a 12 mm x 10 cm right contralateral limb 4. Placement of a 9 mm x 8 cm right external iliac stent for occlusive disease 5. Placement of a 9 mm x 4 cm left external iliac stent for occlusive disease 6. ProGlide closure devices bilateral femoral arteries  Discharged Condition: Good  HPI / Hospital Course:  Antonio Russell is a 59 y.o. male who presents with lifestyle limiting claudication and a >4 cm AAA. The anatomy was suitable for endovascular repair and concomitant treatment of his occlusive disease.  Risks and benefits of repair in an endovascular fashion were discussed and informed consent was obtained.  On Aug 12, 2019 the patient  underwent: 1. US guidance for vascular access, bilateral femoral arteries 2. Catheter placement into aorta from bilateral femoral approaches 3. Placement of a 23 mm proximal, 12 cm distal 18 cm length Gore Excluder Endoprosthesis main body left down to the left external iliac artery with a 12 mm x 10 cm right contralateral limb 4. Placement of a 9 mm x 8 cm right external iliac stent for occlusive disease 5. Placement of a 9 mm x 4 cm left external iliac stent for occlusive disease 6. ProGlide closure devices bilateral femoral arteries  He taught the procedure well was transferred from the recovery room to the ICU without complication.  Patient's night of surgery was unremarkable. During the patient's inpatient stay, his diet was advanced, his Foley was removed, he was ambulating at baseline and his pain was controlled with the use of p.o. pain medication.  Day of discharge, the patient was afebrile with stable vital signs and an unremarkable physical exam.  Extubated: POD # 0  Physical exam:  Alert and oriented x3, no acute distress Cardiovascular: Regular rate and rhythm Pulmonary: Clear to auscultation bilaterally Abdomen: Soft, nondistended, nontender, positive bowel sounds Right groin:  Access site: PAD removed.  Clean dry and intact.  No swelling or ecchymosis noted. Left groin: Access site: PAD removed.  Clean dry and intact.  No swelling or ecchymosis noted. Extremities:  Bilateral legs warm distally to toes.  Minimal edema.   Labs: As below  Complications: None  Consults: None  Significant Diagnostic Studies: CBC  Lab Results  Component Value Date   WBC 22.8 (H) 08/13/2019   HGB 14.8 08/13/2019   HCT 42.4 08/13/2019   MCV 95.5 08/13/2019   PLT 226 08/13/2019   BMET    Component Value Date/Time   NA 134 (L) 08/13/2019 0544   K 4.6 08/13/2019 0544   CL 103 08/13/2019 0544   CO2 22 08/13/2019 0544   GLUCOSE 145 (H) 08/13/2019 0544   BUN 19 08/13/2019 0544    CREATININE 0.97 08/13/2019 0544   CREATININE 0.94 06/12/2019 0852   CALCIUM 10.6 (H) 08/13/2019 0544   GFRNONAA >60 08/13/2019 0544   GFRNONAA 89 06/12/2019 0852   GFRAA >60 08/13/2019 0544   GFRAA 103 06/12/2019 0852   COAG Lab Results  Component Value Date   INR 1.0 08/10/2019   INR 0.99 07/05/2010   Disposition:  Discharge to :Home  Allergies as of 08/13/2019      Reactions   Crestor [rosuvastatin Calcium] Other (See Comments)   Muscle aches   Lipitor [atorvastatin Calcium] Other (See Comments)   Arm soreness   Sertraline Hcl Other (See Comments)   REACTION: muscle twitches   Zocor [simvastatin - High Dose] Other (See Comments)   Muscle pain    Amlodipine    Sexual side effects      Medication List    TAKE these medications   aspirin EC 81 MG tablet Take 1 tablet (81 mg total) by mouth daily.   cetirizine 10 MG tablet Commonly known as: ZYRTEC Take 10 mg by mouth daily.   Chantix Starting Month Pak 0.5 MG X 11 & 1 MG X 42 tablet Generic drug: varenicline TAKE AS DIRECTED ON PACKAGE   Chantix 1 MG tablet Generic drug: varenicline TAKE 1 TABLET (1 MG TOTAL) BY MOUTH 2 (TWO) TIMES DAILY. START AFTER STARTER PACK   clopidogrel 75 MG tablet Commonly known as: PLAVIX Take 1 tablet (75 mg total) by mouth daily at 6 (six) AM. Start taking on: Aug 14, 2019   fexofenadine 60 MG tablet Commonly known as: ALLEGRA Take 60 mg by mouth daily.   fexofenadine 60 MG tablet Commonly known as: ALLEGRA Take 60 mg by mouth 2 (two) times daily.   fluticasone 50 MCG/ACT nasal spray Commonly known as: FLONASE SPRAY 2 SPRAYS INTO EACH NOSTRIL EVERY DAY   meloxicam 15 MG tablet Commonly known as: MOBIC TAKE 1 TABLET (15 MG TOTAL) BY MOUTH DAILY AS NEEDED FOR PAIN (JOINT PAIN). WITH FOOD   metoprolol succinate 100 MG 24 hr tablet Commonly known as: TOPROL-XL Take 1 tablet (100 mg total) by mouth daily. Take with or immediately following a meal.    oxyCODONE-acetaminophen 5-325 MG tablet Commonly known as: PERCOCET/ROXICET Take 1 tablet by mouth every 6 (six) hours as needed for moderate pain or severe pain.   Praluent 75 MG/ML Soaj Generic drug: Alirocumab Inject 1 pen into the skin every 14 (fourteen) days.   Vitamin D (Ergocalciferol) 1.25 MG (50000 UNIT) Caps capsule Commonly known as: DRISDOL Take 1 capsule (50,000 Units total) by mouth every 7 (seven) days. For 12 weeks   Vitamin D 125 MCG (5000 UT) Caps Take 1 capsule by mouth.      Verbal and written Discharge instructions given to the patient. Wound care per Discharge AVS Follow-up Information    Dew, Erskine Squibb, MD Follow up in 1 month(s).   Specialties: Vascular Surgery, Radiology, Interventional Cardiology Why: Can see Dew or Arna Medici. Will need EVAR with visit.  Contact information: [phone] [address] [phone]          Signed: Sela Hua, PA-C  08/13/2019, 11:03 AM

## 2019-08-13 NOTE — Anesthesia Postprocedure Evaluation (Signed)
Anesthesia Post Note  Patient: Antonio Russell  Procedure(s) Performed: ENDOVASCULAR REPAIR/STENT GRAFT (N/A )  Patient location during evaluation: SICU Anesthesia Type: General Level of consciousness: awake and alert and oriented Pain management: pain level controlled Vital Signs Assessment: post-procedure vital signs reviewed and stable Respiratory status: spontaneous breathing and respiratory function stable Cardiovascular status: stable Postop Assessment: no apparent nausea or vomiting Anesthetic complications: no     Last Vitals:  Vitals:   08/13/19 0700 08/13/19 0800  BP: (!) 158/94   Pulse: 99   Resp: 16   Temp:  36.9 C  SpO2: 96%     Last Pain:  Vitals:   08/13/19 0800  TempSrc: Axillary  PainSc: 6                  Rosebud Poles C

## 2019-08-13 NOTE — Plan of Care (Signed)

## 2019-08-13 NOTE — Progress Notes (Signed)
Patient discharged to home this afternoon. Scripts called to [facility] in [place]. PIVs removed. Patient was able to ambulate 2 laps around the unit prior to discharge; tolerated well. VS stable. Disharge instructions, including follow-up appointments, medications, and post-surgical care are discussed with the patient and his spouse prior to discharge. Discharge packet given to patient. All personal belongings returned prior to discharge. Patient left the hospital is good, stable physical condition. Patient denies questions or concerns prior to discharge.

## 2019-08-14 LAB — TYPE AND SCREEN
ABO/RH(D): A POS
Antibody Screen: NEGATIVE
Unit division: 0
Unit division: 0

## 2019-08-14 LAB — BPAM RBC
Blood Product Expiration Date: 202106082359
Blood Product Expiration Date: 202106142359
Unit Type and Rh: 6200
Unit Type and Rh: 6200

## 2019-08-14 LAB — PREPARE RBC (CROSSMATCH)

## 2019-08-27 ENCOUNTER — Telehealth (INDEPENDENT_AMBULATORY_CARE_PROVIDER_SITE_OTHER): Payer: Self-pay

## 2019-08-28 NOTE — Telephone Encounter (Signed)
He can go back since it has been over two weeks.  If he wants to wait we can generate a letter to say that he should continue to stay out until his office visit

## 2019-08-28 NOTE — Telephone Encounter (Signed)
Patient was made aware with medical advice and stated that he is still sore some will prefer waiting until after his follow up to return to work. The patient informed that he will reach out to his short term disability and will call back with a fax number

## 2019-09-07 ENCOUNTER — Other Ambulatory Visit (INDEPENDENT_AMBULATORY_CARE_PROVIDER_SITE_OTHER): Payer: Self-pay | Admitting: Nurse Practitioner

## 2019-09-07 ENCOUNTER — Other Ambulatory Visit (INDEPENDENT_AMBULATORY_CARE_PROVIDER_SITE_OTHER): Payer: Self-pay | Admitting: Vascular Surgery

## 2019-09-07 DIAGNOSIS — I714 Abdominal aortic aneurysm, without rupture, unspecified: Secondary | ICD-10-CM

## 2019-09-07 DIAGNOSIS — I739 Peripheral vascular disease, unspecified: Secondary | ICD-10-CM

## 2019-09-10 ENCOUNTER — Other Ambulatory Visit: Payer: Self-pay

## 2019-09-10 ENCOUNTER — Encounter (INDEPENDENT_AMBULATORY_CARE_PROVIDER_SITE_OTHER): Payer: Self-pay | Admitting: Nurse Practitioner

## 2019-09-10 ENCOUNTER — Ambulatory Visit (INDEPENDENT_AMBULATORY_CARE_PROVIDER_SITE_OTHER): Payer: 59 | Admitting: Nurse Practitioner

## 2019-09-10 ENCOUNTER — Other Ambulatory Visit (INDEPENDENT_AMBULATORY_CARE_PROVIDER_SITE_OTHER): Payer: Self-pay | Admitting: Vascular Surgery

## 2019-09-10 ENCOUNTER — Ambulatory Visit (INDEPENDENT_AMBULATORY_CARE_PROVIDER_SITE_OTHER): Payer: 59

## 2019-09-10 ENCOUNTER — Encounter (INDEPENDENT_AMBULATORY_CARE_PROVIDER_SITE_OTHER): Payer: Self-pay | Admitting: Vascular Surgery

## 2019-09-10 VITALS — BP 148/94 | HR 75 | Ht 72.0 in | Wt 198.0 lb

## 2019-09-10 DIAGNOSIS — I70213 Atherosclerosis of native arteries of extremities with intermittent claudication, bilateral legs: Secondary | ICD-10-CM

## 2019-09-10 DIAGNOSIS — I714 Abdominal aortic aneurysm, without rupture, unspecified: Secondary | ICD-10-CM

## 2019-09-10 DIAGNOSIS — F17200 Nicotine dependence, unspecified, uncomplicated: Secondary | ICD-10-CM

## 2019-09-10 DIAGNOSIS — I739 Peripheral vascular disease, unspecified: Secondary | ICD-10-CM | POA: Diagnosis not present

## 2019-09-10 DIAGNOSIS — E210 Primary hyperparathyroidism: Secondary | ICD-10-CM

## 2019-09-10 DIAGNOSIS — I10 Essential (primary) hypertension: Secondary | ICD-10-CM

## 2019-09-14 ENCOUNTER — Encounter (INDEPENDENT_AMBULATORY_CARE_PROVIDER_SITE_OTHER): Payer: Self-pay | Admitting: Nurse Practitioner

## 2019-09-14 NOTE — Progress Notes (Signed)
Subjective:    Patient ID: Antonio Russell, male    DOB: [birth year], 59 y.o.   MRN: [id number] Chief Complaint  Patient presents with  . Follow-up    U/S Follow up    The patient returns to the office for surveillance of an abdominal aortic aneurysm status post stent graft placement on 08/12/2019.  The patient also had a left limb extender with bilateral external iliac artery stents placed at that time.  The patient notes improvement in the lower extremity symptoms. No interval shortening of the patient's claudication distance or rest pain symptoms.  No new ulcers or wounds have occurred since the last visit.  Patient denies abdominal pain or back pain, no other abdominal complaints. No groin related complaints. No symptoms consistent with distal embolization No changes in claudication distance.   There have been no significant changes to the patient's overall health care.  The patient denies amaurosis fugax or recent TIA symptoms. There are no recent neurological changes noted. The patient denies history of DVT, PE or superficial thrombophlebitis. The patient denies recent episodes of angina or shortness of breath.    Duplex US of the aorta and iliac arteries shows a 4.3 AAA sac with no endoleak, little change in the sac compared to the previous study.  Aortoiliac stents are patent with no focal stenosis.   Review of Systems  Musculoskeletal: Positive for arthralgias.  All other systems reviewed and are negative.      Objective:   Physical Exam Vitals reviewed.  HENT:     Head: Normocephalic.  Cardiovascular:     Rate and Rhythm: Normal rate and regular rhythm.     Pulses: Normal pulses.     Heart sounds: Normal heart sounds.  Pulmonary:     Effort: Pulmonary effort is normal.     Breath sounds: Normal breath sounds.  Skin:    General: Skin is warm and dry.  Neurological:     Mental Status: He is alert and oriented to person, place, and time.  Psychiatric:        Mood  and Affect: Mood normal.        Behavior: Behavior normal.        Thought Content: Thought content normal.        Judgment: Judgment normal.     BP (!) 148/94   Pulse 75   Ht 6' (1.829 m)   Wt 198 lb (89.8 kg)   BMI 26.85 kg/m   Past Medical History:  Diagnosis Date  . Allergic rhinitis   . Allergy   . Anxiety   . Arthritis   . Back pain   . Chest pain 2012   cardiac cath - nonobstructive, nl LV fxn, rec aggressive med management  . GERD (gastroesophageal reflux disease)   . Headache(784.0)   . HLD (hyperlipidemia)   . Hyperparathyroidism ([place])   . Hypertension   . Impotence of organic origin   . Other testicular hypofunction   . Personal history of urinary calculi   . Sleep apnea   . Tobacco use disorder   . Unspecified hearing loss     Social History   Socioeconomic History  . Marital status: Married    Spouse name: Not on file  . Number of children: Not on file  . Years of education: Not on file  . Highest education level: Not on file  Occupational History  . Not on file  Tobacco Use  . Smoking status: Current Every Day Smoker  Packs/day: 0.50    Years: 35.00    Pack years: 17.50    Types: Cigarettes  . Smokeless tobacco: Former Network engineer  . Vaping Use: Never used  Substance and Sexual Activity  . Alcohol use: Yes    Alcohol/week: 0.0 standard drinks    Comment: 6 pack 3 times weekly  . Drug use: No  . Sexual activity: Not on file  Other Topics Concern  . Not on file  Social History Narrative   Coaches basketball   Social Determinants of Health   Financial Resource Strain:   . Difficulty of Paying Living Expenses:   Food Insecurity:   . Worried About Charity fundraiser in the Last Year:   . Arboriculturist in the Last Year:   Transportation Needs:   . Film/video editor (Medical):   [name] Lack of Transportation (Non-Medical):   Physical Activity:   . Days of Exercise per Week:   . Minutes of Exercise per Session:   Stress:     . Feeling of Stress :   Social Connections:   . Frequency of Communication with Friends and Family:   . Frequency of Social Gatherings with Friends and Family:   . Attends Religious Services:   . Active Member of Clubs or Organizations:   . Attends Archivist Meetings:   [name] Marital Status:   Intimate Partner Violence:   . Fear of Current or Ex-Partner:   . Emotionally Abused:   [name] Physically Abused:   . Sexually Abused:     Past Surgical History:  Procedure Laterality Date  . COLON SURGERY    . COLONOSCOPY    . ENDOVASCULAR REPAIR/STENT GRAFT N/A 08/12/2019   Procedure: ENDOVASCULAR REPAIR/STENT GRAFT;  Surgeon: Algernon Huxley, MD;  Location: [facility];  Service: Cardiovascular;  Laterality: N/A;  . HEMORRHOID SURGERY    . JOINT REPLACEMENT    . KNEE SURGERY Right 2013  . NASAL SEPTUM SURGERY      Family History  Problem Relation Age of Onset  . Hypertension Mother   . Hyperlipidemia Mother   . Diabetes Mother   . Hypertension Father   . Hyperlipidemia Father   . Cancer Father        throat  . Hypertension Sister   . Lung cancer Other        GF  . Heart disease Other        GM  . Esophageal cancer Paternal Uncle   . Colon cancer Neg Hx   . Stomach cancer Neg Hx   . Rectal cancer Neg Hx     Allergies  Allergen Reactions  . Crestor [Rosuvastatin Calcium] Other (See Comments)    Muscle aches  . Lipitor [Atorvastatin Calcium] Other (See Comments)    Arm soreness   . Sertraline Hcl Other (See Comments)    REACTION: muscle twitches  . Zocor [Simvastatin - High Dose] Other (See Comments)    Muscle pain   . Amlodipine     Sexual side effects       Assessment & Plan:   1. Atherosclerosis of native artery of both lower extremities with intermittent claudication (HCC)  Recommend:  The patient has evidence of atherosclerosis of the lower extremities with claudication.  The patient does not voice lifestyle limiting changes at this point in  time.  Noninvasive studies do not suggest clinically significant change.  No invasive studies, angiography or surgery at this time The patient should  continue walking and begin a more formal exercise program.  The patient should continue antiplatelet therapy and aggressive treatment of the lipid abnormalities  No changes in the patient's medications at this time  The patient should continue wearing graduated compression socks 10-15 mmHg strength to control the mild edema.   Patient will follow up in 3 months for ABIs. 2. AAA (abdominal aortic aneurysm) without rupture (HCC) Recommend: Patient is status post successful endovascular repair of the AAA.   No further intervention is required at this time.   No endoleak is detected and the aneurysm sac is stable.  The patient will continue antiplatelet therapy as prescribed as well as aggressive management of hyperlipidemia. Exercise is again strongly encouraged.   However, endografts require continued surveillance with ultrasound or CT scan. This is mandatory to detect any changes that allow repressurization of the aneurysm sac.  The patient is informed that this would be asymptomatic.  The patient is reminded that lifelong routine surveillance is a necessity with an endograft. Patient will continue to follow-up at 6 month intervals with ultrasound of the aorta.  3. Essential hypertension Continue antihypertensive medications as already ordered, these medications have been reviewed and there are no changes at this time.   4. Primary hyperparathyroidism ([place]) Discussed with patient his hyperparathyroidism, he is hopeful to have surgery to treat the problem soon however work-up is still needed.  The patient should wait 3 months to have surgery so that he can stop his anticoagulation for surgery.  5. TOBACCO USE The patient notes that he is working on stopping smoking.  This is highly encouraged.  As continuing to smoke can lessen the  longevity of endovascular interventions.  3 5 minutes spent on this topic specifically.   Current Outpatient Medications on File Prior to Visit  Medication Sig Dispense Refill  . Alirocumab (PRALUENT) 75 MG/ML SOAJ Inject 1 pen into the skin every 14 (fourteen) days. 2 pen 5  . aspirin EC 81 MG tablet Take 1 tablet (81 mg total) by mouth daily. 90 tablet 3  . Cholecalciferol (VITAMIN D) 125 MCG (5000 UT) CAPS Take 1 capsule by mouth.    . clopidogrel (PLAVIX) 75 MG tablet Take 1 tablet (75 mg total) by mouth daily at 6 (six) AM. 90 tablet 1  . fexofenadine (ALLEGRA) 60 MG tablet Take 60 mg by mouth 2 (two) times daily.    . fluticasone (FLONASE) 50 MCG/ACT nasal spray SPRAY 2 SPRAYS INTO EACH NOSTRIL EVERY DAY 16 g 5  . cetirizine (ZYRTEC) 10 MG tablet Take 10 mg by mouth daily. (Patient not taking: Reported on 09/10/2019)    . CHANTIX 1 MG tablet TAKE 1 TABLET (1 MG TOTAL) BY MOUTH 2 (TWO) TIMES DAILY. START AFTER STARTER PACK (Patient not taking: Reported on 09/10/2019) 180 tablet 0  . fexofenadine (ALLEGRA) 60 MG tablet Take 60 mg by mouth daily. (Patient not taking: Reported on 09/10/2019)    . meloxicam (MOBIC) 15 MG tablet TAKE 1 TABLET (15 MG TOTAL) BY MOUTH DAILY AS NEEDED FOR PAIN (JOINT PAIN). WITH FOOD (Patient not taking: Reported on 09/10/2019) 30 tablet 3  . metoprolol succinate (TOPROL-XL) 100 MG 24 hr tablet Take 1 tablet (100 mg total) by mouth daily. Take with or immediately following a meal. (Patient not taking: Reported on 09/10/2019) 30 tablet 11  . oxyCODONE-acetaminophen (PERCOCET/ROXICET) 5-325 MG tablet Take 1 tablet by mouth every 6 (six) hours as needed for moderate pain or severe pain. (Patient not taking: Reported on 09/10/2019)  28 tablet 0  . varenicline (CHANTIX STARTING MONTH PAK) 0.5 MG X 11 & 1 MG X 42 tablet TAKE AS DIRECTED ON PACKAGE (Patient not taking: Reported on 09/10/2019) 53 each 0  . Vitamin D, Ergocalciferol, (DRISDOL) 1.25 MG (50000 UNIT) CAPS capsule Take 1  capsule (50,000 Units total) by mouth every 7 (seven) days. For 12 weeks (Patient not taking: Reported on 09/10/2019) 12 capsule 0   No current facility-administered medications on file prior to visit.    There are no Patient Instructions on file for this visit. No follow-ups on file.   Kris Hartmann, NP

## 2019-09-15 ENCOUNTER — Other Ambulatory Visit: Payer: Self-pay

## 2019-09-15 ENCOUNTER — Telehealth: Payer: Self-pay

## 2019-09-15 ENCOUNTER — Emergency Department
Admission: EM | Admit: 2019-09-15 | Discharge: 2019-09-15 | Disposition: A | Discharge status: Home / Self Care | Payer: 59 | Attending: Student in an Organized Health Care Education/Training Program | Admitting: Student in an Organized Health Care Education/Training Program

## 2019-09-15 ENCOUNTER — Telehealth (INDEPENDENT_AMBULATORY_CARE_PROVIDER_SITE_OTHER): Payer: Self-pay

## 2019-09-15 DIAGNOSIS — I2510 Atherosclerotic heart disease of native coronary artery without angina pectoris: Secondary | ICD-10-CM | POA: Diagnosis not present

## 2019-09-15 DIAGNOSIS — Z7982 Long term (current) use of aspirin: Secondary | ICD-10-CM | POA: Insufficient documentation

## 2019-09-15 DIAGNOSIS — Z79899 Other long term (current) drug therapy: Secondary | ICD-10-CM | POA: Insufficient documentation

## 2019-09-15 DIAGNOSIS — I10 Essential (primary) hypertension: Secondary | ICD-10-CM | POA: Insufficient documentation

## 2019-09-15 DIAGNOSIS — R310 Gross hematuria: Secondary | ICD-10-CM | POA: Diagnosis not present

## 2019-09-15 DIAGNOSIS — F17210 Nicotine dependence, cigarettes, uncomplicated: Secondary | ICD-10-CM | POA: Diagnosis not present

## 2019-09-15 DIAGNOSIS — E119 Type 2 diabetes mellitus without complications: Secondary | ICD-10-CM | POA: Diagnosis not present

## 2019-09-15 DIAGNOSIS — R319 Hematuria, unspecified: Secondary | ICD-10-CM | POA: Diagnosis present

## 2019-09-15 DIAGNOSIS — Z7984 Long term (current) use of oral hypoglycemic drugs: Secondary | ICD-10-CM | POA: Insufficient documentation

## 2019-09-15 LAB — BASIC METABOLIC PANEL
Anion gap: 8 (ref 5–15)
BUN: 23 mg/dL — ABNORMAL HIGH (ref 6–20)
CO2: 23 mmol/L (ref 22–32)
Calcium: 10.5 mg/dL — ABNORMAL HIGH (ref 8.9–10.3)
Chloride: 104 mmol/L (ref 98–111)
Creatinine, Ser: 1.05 mg/dL (ref 0.61–1.24)
GFR calc Af Amer: >60 mL/min (ref >60)
GFR calc non Af Amer: >60 mL/min (ref >60)
Glucose, Bld: 120 mg/dL — ABNORMAL HIGH (ref 70–99)
Potassium: 4.5 mmol/L (ref 3.5–5.1)
Sodium: 135 mmol/L (ref 135–145)

## 2019-09-15 LAB — CBC
HCT: 39.7 % (ref 39.0–52.0)
Hemoglobin: 13.5 g/dL (ref 13.0–17.0)
MCH: 33 pg (ref 26.0–34.0)
MCHC: 34 g/dL (ref 30.0–36.0)
MCV: 97.1 fL (ref 80.0–100.0)
Platelets: 198 10*3/uL (ref 150–400)
RBC: 4.09 MIL/uL — ABNORMAL LOW (ref 4.22–5.81)
RDW: 13 % (ref 11.5–15.5)
WBC: 10.9 10*3/uL — ABNORMAL HIGH (ref 4.0–10.5)
nRBC: 0 % (ref 0.0–0.2)

## 2019-09-15 LAB — URINALYSIS, COMPLETE (UACMP) WITH MICROSCOPIC
Bilirubin Urine: NEGATIVE
Glucose, UA: NEGATIVE mg/dL
Ketones, ur: NEGATIVE mg/dL
Leukocytes,Ua: NEGATIVE
Nitrite: NEGATIVE
Protein, ur: 30 mg/dL — AB
RBC / HPF: >50 RBC/hpf — ABNORMAL HIGH (ref 0–5)
Specific Gravity, Urine: 1.017 (ref 1.005–1.030)
Squamous Epithelial / HPF: NONE SEEN (ref 0–5)
pH: 6 (ref 5.0–8.0)

## 2019-09-15 NOTE — Telephone Encounter (Signed)
Patient notified of the below and expressed understanding.

## 2019-09-15 NOTE — ED Triage Notes (Signed)
Pt arrives via POV from home for c/o blood in urine that started last night. Pt reports having aortic aneurysm repair on 05/12 with stent placement. Pt reports he has been on plavix since his procedure as well as ASA. Pt in NAD, A&Ox4. Denies abdominal pain.

## 2019-09-15 NOTE — Telephone Encounter (Signed)
It is likely that this is related to a spontaneous bleed after being on blood thinners.  The stents themselves haven't moved.  He should stop his Plavix but continue ASA.  I would also have him reach out to his PCP, so he can have blood work and urine studies.  He will also likely need to see urology so that they can try to determine the source.  I'll place a urology referral.

## 2019-09-15 NOTE — Discharge Instructions (Addendum)
Be sure to drink plenty of fluids.  As we discussed, please hold your Plavix but continue aspirin until you are further directed by PCP and/or vascular surgery.  Please return to the ER if you develop any pain, fever, difficulty emptying her bladder.  Please follow-up with urology.

## 2019-09-15 NOTE — ED Provider Notes (Signed)
[facility] Emergency Department Provider Note    First MD Initiated Contact with Patient 09/15/19 2211     (approximate)  I have reviewed the triage vital signs and the nursing notes.   HISTORY  Chief Complaint Hematuria    HPI Antonio Russell is a 59 y.o. male the below listed past medical history status post recent endovascular repair of AAA on aspirin and Plavix presents to the ER for pink-tinged urine since last night.  States he went back to work yesterday and noted the blood-tinged urine.  Denies any trauma.  Denies any pain.  No fevers.  Denies any history of stones.  No nausea or vomiting.  No abdominal or back pain.      Past Medical History:  Diagnosis Date  . Allergic rhinitis   . Allergy   . Anxiety   . Arthritis   . Back pain   . Chest pain 2012   cardiac cath - nonobstructive, nl LV fxn, rec aggressive med management  . GERD (gastroesophageal reflux disease)   . Headache(784.0)   . HLD (hyperlipidemia)   . Hyperparathyroidism ([place])   . Hypertension   . Impotence of organic origin   . Other testicular hypofunction   . Personal history of urinary calculi   . Sleep apnea   . Tobacco use disorder   . Unspecified hearing loss    Family History  Problem Relation Age of Onset  . Hypertension Mother   . Hyperlipidemia Mother   . Diabetes Mother   . Hypertension Father   . Hyperlipidemia Father   . Cancer Father        throat  . Hypertension Sister   . Lung cancer Other        GF  . Heart disease Other        GM  . Esophageal cancer Paternal Uncle   . Colon cancer Neg Hx   . Stomach cancer Neg Hx   . Rectal cancer Neg Hx    Past Surgical History:  Procedure Laterality Date  . COLON SURGERY    . COLONOSCOPY    . ENDOVASCULAR REPAIR/STENT GRAFT N/A 08/12/2019   Procedure: ENDOVASCULAR REPAIR/STENT GRAFT;  Surgeon: Algernon Huxley, MD;  Location: [place] [facility];  Service: Cardiovascular;  Laterality: N/A;  . HEMORRHOID  SURGERY    . JOINT REPLACEMENT    . KNEE SURGERY Right 2013  . NASAL SEPTUM SURGERY     Patient Active Problem List   Diagnosis Date Noted  . AAA (abdominal aortic aneurysm) without rupture ([place]) 07/14/2019  . Atherosclerosis of native arteries of extremity with intermittent claudication ([place]) 07/14/2019  . Multiple joint pain 05/01/2019  . Primary hyperparathyroidism ([place]) 01/14/2019  . Vitamin D deficiency 01/14/2019  . Left knee pain 12/01/2018  . Left foot pain 12/01/2018  . Chronic daily headache 12/06/2017  . Hypercalcemia 11/08/2017  . Headache 11/04/2017  . External hemorrhoid 11/05/2016  . Alcohol use 11/04/2016  . Prediabetes 11/02/2016  . Skin lesion of left leg 11/02/2016  . Routine general medical examination at a health care facility 10/25/2016  . Prostate cancer screening 10/25/2016  . Leukocytosis 11/30/2014  . Special screening for malignant neoplasms, colon 02/16/2011  . Arthritis   . Essential hypertension 07/13/2010  . CAD (coronary artery disease)   . Testosterone deficiency 05/25/2009  . Hyperlipidemia 05/25/2009  . 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 RENAL CALCULUS, HX OF 05/11/2009  . TOBACCO USE 09/12/2007  . LOSS, HEARING NOS  10/10/2006      Prior to Admission medications   Medication Sig Start Date End Date Taking? Authorizing Provider  Alirocumab (PRALUENT) 75 MG/ML SOAJ Inject 1 pen into the skin every 14 (fourteen) days. 06/08/19   Kate Sable, MD  aspirin EC 81 MG tablet Take 1 tablet (81 mg total) by mouth daily. 07/10/19   Kate Sable, MD  cetirizine (ZYRTEC) 10 MG tablet Take 10 mg by mouth daily. Patient not taking: Reported on 09/10/2019    [provider]  CHANTIX 1 MG tablet TAKE 1 TABLET (1 MG TOTAL) BY MOUTH 2 (TWO) TIMES DAILY. START AFTER STARTER PACK Patient not taking: Reported on 09/10/2019 07/29/19   Tower, Wynelle Fanny, MD  Cholecalciferol (VITAMIN D) 125 MCG (5000 UT) CAPS Take 1 capsule by mouth.     [provider]  clopidogrel (PLAVIX) 75 MG tablet Take 1 tablet (75 mg total) by mouth daily at 6 (six) AM. 08/14/19   Stegmayer, Janalyn Harder, PA-C  fexofenadine (ALLEGRA) 60 MG tablet Take 60 mg by mouth daily. Patient not taking: Reported on 09/10/2019    [provider]  fexofenadine (ALLEGRA) 60 MG tablet Take 60 mg by mouth 2 (two) times daily.    [provider]  fluticasone (FLONASE) 50 MCG/ACT nasal spray SPRAY 2 SPRAYS INTO EACH NOSTRIL EVERY DAY 12/31/17   Carollee Herter, Alferd Apa, DO  meloxicam (MOBIC) 15 MG tablet TAKE 1 TABLET (15 MG TOTAL) BY MOUTH DAILY AS NEEDED FOR PAIN (JOINT PAIN). WITH FOOD Patient not taking: Reported on 09/10/2019 05/07/19   Tower, Wynelle Fanny, MD  metoprolol succinate (TOPROL-XL) 100 MG 24 hr tablet Take 1 tablet (100 mg total) by mouth daily. Take with or immediately following a meal. Patient not taking: Reported on 09/10/2019 12/01/18   Tower, Wynelle Fanny, MD  oxyCODONE-acetaminophen (PERCOCET/ROXICET) 5-325 MG tablet Take 1 tablet by mouth every 6 (six) hours as needed for moderate pain or severe pain. Patient not taking: Reported on 09/10/2019 08/13/19   Stegmayer, Joelene Millin A, PA-C  varenicline (CHANTIX STARTING MONTH PAK) 0.5 MG X 11 & 1 MG X 42 tablet TAKE AS DIRECTED ON PACKAGE Patient not taking: Reported on 09/10/2019 12/26/18   Tower, Wynelle Fanny, MD  Vitamin D, Ergocalciferol, (DRISDOL) 1.25 MG (50000 UNIT) CAPS capsule Take 1 capsule (50,000 Units total) by mouth every 7 (seven) days. For 12 weeks Patient not taking: Reported on 09/10/2019 05/05/19   Abner Greenspan, MD    Allergies Crestor [rosuvastatin calcium], Lipitor [atorvastatin calcium], Sertraline hcl, Zocor [simvastatin - high dose], and Amlodipine    Social History Social History   Tobacco Use  . Smoking status: Current Every Day Smoker    Packs/day: 0.50    Years: 35.00    Pack years: 17.50    Types: Cigarettes  . Smokeless tobacco: Former Network engineer  . Vaping Use:  Never used  Substance Use Topics  . Alcohol use: Yes    Alcohol/week: 0.0 standard drinks    Comment: 6 pack 3 times weekly  . Drug use: No    Review of Systems Patient denies headaches, rhinorrhea, blurry vision, numbness, shortness of breath, chest pain, edema, cough, abdominal pain, nausea, vomiting, diarrhea, dysuria, fevers, rashes or hallucinations unless otherwise stated above in HPI. ____________________________________________   PHYSICAL EXAM:  VITAL SIGNS: Vitals:   09/15/19 1711  BP: (!) 153/89  Pulse: 81  Resp: 17  Temp: 98.5 F (36.9 C)  SpO2: 97%    Constitutional: Alert and oriented.  Eyes: Conjunctivae are normal.  Head: Atraumatic. Nose: No congestion/rhinnorhea. Mouth/Throat: Mucous membranes are moist.   Neck: No stridor. Painless ROM.  Cardiovascular: Normal rate, regular rhythm. Grossly normal heart sounds.  Good peripheral circulation. Respiratory: Normal respiratory effort.  No retractions. Lungs CTAB. Gastrointestinal: Soft and nontender. No distention. No abdominal bruits. No CVA tenderness. Genitourinary:  Musculoskeletal: No lower extremity tenderness nor edema.  No joint effusions. Neurologic:  Normal speech and language. No gross focal neurologic deficits are appreciated. No facial droop Skin:  Skin is warm, dry and intact. No rash noted. Psychiatric: Mood and affect are normal. Speech and behavior are normal.  ____________________________________________   LABS (all labs ordered are listed, but only abnormal results are displayed)  Results for orders placed or performed during the hospital encounter of 09/15/19 (from the past 24 hour(s))  Urinalysis, Complete w Microscopic     Status: Abnormal   Collection Time: 09/15/19  5:17 PM  Result Value Ref Range   Color, Urine YELLOW (A) YELLOW   APPearance HAZY (A) CLEAR   Specific Gravity, Urine 1.017 1.005 - 1.030   pH 6.0 5.0 - 8.0   Glucose, UA NEGATIVE NEGATIVE mg/dL   Hgb urine  dipstick LARGE (A) NEGATIVE   Bilirubin Urine NEGATIVE NEGATIVE   Ketones, ur NEGATIVE NEGATIVE mg/dL   Protein, ur 30 (A) NEGATIVE mg/dL   Nitrite NEGATIVE NEGATIVE   Leukocytes,Ua NEGATIVE NEGATIVE   RBC / HPF >50 (H) 0 - 5 RBC/hpf   WBC, UA 6-10 0 - 5 WBC/hpf   Bacteria, UA RARE (A) NONE SEEN   Squamous Epithelial / LPF NONE SEEN 0 - 5  Basic metabolic panel     Status: Abnormal   Collection Time: 09/15/19  5:17 PM  Result Value Ref Range   Sodium 135 135 - 145 mmol/L   Potassium 4.5 3.5 - 5.1 mmol/L   Chloride 104 98 - 111 mmol/L   CO2 23 22 - 32 mmol/L   Glucose, Bld 120 (H) 70 - 99 mg/dL   BUN 23 (H) 6 - 20 mg/dL   Creatinine, Ser 1.05 0.61 - 1.24 mg/dL   Calcium 10.5 (H) 8.9 - 10.3 mg/dL   GFR calc non Af Amer >60 >60 mL/min   GFR calc Af Amer >60 >60 mL/min   Anion gap 8 5 - 15  CBC     Status: Abnormal   Collection Time: 09/15/19  5:17 PM  Result Value Ref Range   WBC 10.9 (H) 4.0 - 10.5 K/uL   RBC 4.09 (L) 4.22 - 5.81 MIL/uL   Hemoglobin 13.5 13.0 - 17.0 g/dL   HCT 39.7 39 - 52 %   MCV 97.1 80.0 - 100.0 fL   MCH 33.0 26.0 - 34.0 pg   MCHC 34.0 30.0 - 36.0 g/dL   RDW 13.0 11.5 - 15.5 %   Platelets 198 150 - 400 K/uL   nRBC 0.0 0.0 - 0.2 %   ____________________________________________ ____________________________________________  RADIOLOGY   ____________________________________________   PROCEDURES  Procedure(s) performed:  Procedures    Critical Care performed: no ____________________________________________   INITIAL IMPRESSION / ASSESSMENT AND PLAN / ED COURSE  Pertinent labs & imaging results that were available during my care of the patient were reviewed by me and considered in my medical decision making (see chart for details).   DDX: Cystitis, stone, BPH, medication effect, mass  Antonio Russell is a 59 y.o. who presents to the ED with pink-tinged urine.  Patient is afebrile and hemodynamically  stable.  His abdominal exam is reassuring.   He is not complaining of any pain.  His blood work is reassuring.  He is not having clots or retention.  This is most likely secondary to being on Plavix.  Review of the chart shows that coordination with his vascular surgery providers had recommended him to hold Plavix.  Will give referral to urology.  Discussed signs symptoms for which patient should return to the ER.     The patient was evaluated in Emergency Department today for the symptoms described in the history of present illness. He/she was evaluated in the context of the global COVID-19 pandemic, which necessitated consideration that the patient might be at risk for infection with the SARS-CoV-2 virus that causes COVID-19. Institutional protocols and algorithms that pertain to the evaluation of patients at risk for COVID-19 are in a state of rapid change based on information released by regulatory bodies including the CDC and federal and state organizations. These policies and algorithms were followed during the patient's care in the ED.  As part of my medical decision making, I reviewed the following data within the [place] notes reviewed and incorporated, Labs reviewed, notes from prior ED visits and [place] Controlled Substance Database   ____________________________________________   FINAL CLINICAL IMPRESSION(S) / ED DIAGNOSES  Final diagnoses:  Gross hematuria      NEW MEDICATIONS STARTED DURING THIS VISIT:  New Prescriptions   No medications on file     Note:  This document was prepared using Dragon voice recognition software and may include unintentional dictation errors.    Merlyn Lot, MD 09/15/19 2238

## 2019-09-15 NOTE — Telephone Encounter (Signed)
Pt had abd aortic aneurysm repair on 08/12/19.  Last night pt's urine was pinkish but as the night went on pts urine became darker bright red bloody urine. Pt drank some beers last night to flush kidneys and did not work. Pt has drank a lot of water today and no improvement in dark bloody urine. Pt contacted surgeon and was advised to continue baby asa but stop plavix and surgeon's NP did urology referral. Pt was advised to contact PCP for blood and urine test. No abd or back pain, no CP,H/A. SOB or dizziness. Pt has chronic fatigue due to thyroid issues. I spoke with Dr Glori Bickers and she said if no improvement in blood in urine getting lighter in color or less often then pt needs to go to ED for Eval and testing. Pt voiced understanding and pt will go to [facility] ED. Pt denied 911. FYI to Dr Glori Bickers.

## 2019-09-15 NOTE — Telephone Encounter (Signed)
Aware, I will watch for correspondence

## 2019-09-16 ENCOUNTER — Other Ambulatory Visit (INDEPENDENT_AMBULATORY_CARE_PROVIDER_SITE_OTHER): Payer: Self-pay | Admitting: Nurse Practitioner

## 2019-09-16 DIAGNOSIS — R310 Gross hematuria: Secondary | ICD-10-CM

## 2019-09-18 ENCOUNTER — Ambulatory Visit (INDEPENDENT_AMBULATORY_CARE_PROVIDER_SITE_OTHER): Payer: 59 | Admitting: Urology

## 2019-09-18 ENCOUNTER — Encounter: Payer: Self-pay | Admitting: Urology

## 2019-09-18 ENCOUNTER — Other Ambulatory Visit: Payer: Self-pay

## 2019-09-18 VITALS — BP 151/88 | HR 82 | Ht 72.0 in | Wt 197.0 lb

## 2019-09-18 DIAGNOSIS — R310 Gross hematuria: Secondary | ICD-10-CM

## 2019-09-18 NOTE — Progress Notes (Signed)
09/18/2019 10:14 AM   Antonio Russell Dec 02, 1960 [id number]  Referring provider: Kris Hartmann, NP [place],  [place] [address]  Chief Complaint  Patient presents with   Hematuria    HPI: Antonio Russell is a 59 y.o. male who presents for evaluation of gross hematuria.  -Seen [facility] ED 09/15/2019 with a 24-hour history of gross hematuria -Urine was pink-tinged with UA showing >50 RBCs -Has continued with hematuria; urine tea colored in the a.m. and then more active he is the urine will vary from pain-read -No clots or voiding difficulty -No flank, abdominal or pelvic pain -Denies dysuria -Status post endovascular repair of AAA 08/12/2019 by Dr. Lucky Cowboy -On ASA/Plavix -No previous episodes hematuria -Prior urologic evaluation for hypogonadism; was on TRT for 2 years then discontinued -Current smoker; 17.5 pack years; on Chantix   PMH: Past Medical History:  Diagnosis Date   Allergic rhinitis    Allergy    Anxiety    Arthritis    Back pain    Chest pain 2012   cardiac cath - nonobstructive, nl LV fxn, rec aggressive med management   GERD (gastroesophageal reflux disease)    Headache(784.0)    HLD (hyperlipidemia)    Hyperparathyroidism ([place])    Hypertension    Impotence of organic origin    Other testicular hypofunction    Personal history of urinary calculi    Sleep apnea    Tobacco use disorder    Unspecified hearing loss     Surgical History: Past Surgical History:  Procedure Laterality Date   COLON SURGERY     COLONOSCOPY     ENDOVASCULAR REPAIR/STENT GRAFT N/A 08/12/2019   Procedure: ENDOVASCULAR REPAIR/STENT GRAFT;  Surgeon: Algernon Huxley, MD;  Location: [place] [facility];  Service: Cardiovascular;  Laterality: N/A;   HEMORRHOID SURGERY     JOINT REPLACEMENT     KNEE SURGERY Right 2013   NASAL SEPTUM SURGERY      Home Medications:  Allergies as of 09/18/2019      Reactions   Crestor [rosuvastatin Calcium] Other (See  Comments)   Muscle aches   Lipitor [atorvastatin Calcium] Other (See Comments)   Arm soreness   Sertraline Hcl Other (See Comments)   REACTION: muscle twitches   Zocor [simvastatin - High Dose] Other (See Comments)   Muscle pain    Amlodipine    Sexual side effects      Medication List       Accurate as of September 18, 2019 10:14 AM. If you have any questions, ask your nurse or doctor.        aspirin EC 81 MG tablet Take 1 tablet (81 mg total) by mouth daily.   cetirizine 10 MG tablet Commonly known as: ZYRTEC Take 10 mg by mouth daily.   Chantix Starting Month Pak 0.5 MG X 11 & 1 MG X 42 tablet Generic drug: varenicline TAKE AS DIRECTED ON PACKAGE   Chantix 1 MG tablet Generic drug: varenicline TAKE 1 TABLET (1 MG TOTAL) BY MOUTH 2 (TWO) TIMES DAILY. START AFTER STARTER PACK   clopidogrel 75 MG tablet Commonly known as: PLAVIX Take 1 tablet (75 mg total) by mouth daily at 6 (six) AM.   fexofenadine 60 MG tablet Commonly known as: ALLEGRA Take 60 mg by mouth daily.   fexofenadine 60 MG tablet Commonly known as: ALLEGRA Take 60 mg by mouth 2 (two) times daily.   fluticasone 50 MCG/ACT nasal spray Commonly known as: FLONASE SPRAY 2  SPRAYS INTO EACH NOSTRIL EVERY DAY   meloxicam 15 MG tablet Commonly known as: MOBIC TAKE 1 TABLET (15 MG TOTAL) BY MOUTH DAILY AS NEEDED FOR PAIN (JOINT PAIN). WITH FOOD   metoprolol succinate 100 MG 24 hr tablet Commonly known as: TOPROL-XL Take 1 tablet (100 mg total) by mouth daily. Take with or immediately following a meal.   oxyCODONE-acetaminophen 5-325 MG tablet Commonly known as: PERCOCET/ROXICET Take 1 tablet by mouth every 6 (six) hours as needed for moderate pain or severe pain.   Praluent 75 MG/ML Soaj Generic drug: Alirocumab Inject 1 pen into the skin every 14 (fourteen) days.   Vitamin D (Ergocalciferol) 1.25 MG (50000 UNIT) Caps capsule Commonly known as: DRISDOL Take 1 capsule (50,000 Units total) by mouth  every 7 (seven) days. For 12 weeks   Vitamin D 125 MCG (5000 UT) Caps Take 1 capsule by mouth.       Allergies:  Allergies  Allergen Reactions   Crestor [Rosuvastatin Calcium] Other (See Comments)    Muscle aches   Lipitor [Atorvastatin Calcium] Other (See Comments)    Arm soreness    Sertraline Hcl Other (See Comments)    REACTION: muscle twitches   Zocor [Simvastatin - High Dose] Other (See Comments)    Muscle pain    Amlodipine     Sexual side effects    Family History: Family History  Problem Relation Age of Onset   Hypertension Mother    Hyperlipidemia Mother    Diabetes Mother    Hypertension Father    Hyperlipidemia Father    Cancer Father        throat   Hypertension Sister    Lung cancer Other        GF   Heart disease Other        GM   Esophageal cancer Paternal Uncle    Colon cancer Neg Hx    Stomach cancer Neg Hx    Rectal cancer Neg Hx     Social History:  reports that he has been smoking cigarettes. He has a 17.50 pack-year smoking history. He has quit using smokeless tobacco. He reports current alcohol use. He reports that he does not use drugs.   Physical Exam: BP (!) 151/88    Pulse 82    Ht 6' (1.829 m)    Wt 197 lb (89.4 kg)    BMI 26.72 kg/m   Constitutional:  Alert and oriented, No acute distress. HEENT: [place] AT, moist mucus membranes.  Trachea midline, no masses. Cardiovascular: No clubbing, cyanosis, or edema. Respiratory: Normal respiratory effort, no increased work of breathing. Skin: No rashes, bruises or suspicious lesions. Neurologic: Grossly intact, no focal deficits, moving all 4 extremities. Psychiatric: Normal mood and affect.  Imaging: CT angio abdomen/pelvis 07/24/2019 personally reviewed  Assessment & Plan:    1.  Gross hematuria -AUA risk stratification: High -Potential etiologies were discussed including benign causes and urinary tract malignancies.  We discussed the standard recommended evaluation  for high risk hematuria to include CT urogram and cystoscopy.  He did have a CT angio abdomen/pelvis with and without contrast 07/2019 which did not show genitourinary abnormalities.  Will schedule office cystoscopy.  He was reluctant to have performed and will send in Rx Valium/hydrocodone to take 30 minutes prior to procedure.  He was informed he would need a driver if taking this medication.    Abbie Sons, [place] [address] [place], [place] [address] [phone]  [phone] ° °

## 2019-09-21 ENCOUNTER — Encounter: Payer: Self-pay | Admitting: Urology

## 2019-09-21 MED ORDER — HYDROCODONE-ACETAMINOPHEN 5-325 MG PO TABS: ORAL_TABLET | ORAL | 0 refills | Status: DC

## 2019-09-21 MED ORDER — DIAZEPAM 10 MG PO TABS
ORAL_TABLET | ORAL | 0 refills | Status: DC
Start: 2019-09-21 — End: 2019-12-02

## 2019-09-22 LAB — URINALYSIS, COMPLETE
Bilirubin, UA: NEGATIVE
Leukocytes,UA: NEGATIVE
Nitrite, UA: NEGATIVE
Specific Gravity, UA: 1.025 (ref 1.005–1.030)
Urobilinogen, Ur: 1 mg/dL (ref 0.2–1.0)
pH, UA: 6 (ref 5.0–7.5)

## 2019-09-22 LAB — MICROSCOPIC EXAMINATION
Bacteria, UA: NONE SEEN
RBC, Urine: >30 /hpf — AB (ref 0–2)

## 2019-09-23 ENCOUNTER — Encounter: Payer: Self-pay | Admitting: *Deleted

## 2019-09-23 ENCOUNTER — Ambulatory Visit (INDEPENDENT_AMBULATORY_CARE_PROVIDER_SITE_OTHER): Payer: 59 | Admitting: Urology

## 2019-09-23 ENCOUNTER — Encounter: Payer: Self-pay | Admitting: Urology

## 2019-09-23 ENCOUNTER — Other Ambulatory Visit: Payer: Self-pay | Admitting: Urology

## 2019-09-23 ENCOUNTER — Other Ambulatory Visit: Payer: Self-pay

## 2019-09-23 VITALS — BP 153/91 | HR 90 | Ht 72.0 in | Wt 200.0 lb

## 2019-09-23 DIAGNOSIS — R310 Gross hematuria: Secondary | ICD-10-CM | POA: Diagnosis not present

## 2019-09-23 NOTE — Progress Notes (Signed)
° °  09/23/2019  CC:  Chief Complaint  Patient presents with   Cysto    HPI: See office note 09/18/2019.  Denies recurrent gross hematuria  Antonio Russell is a 59 y.o. male who presents for a cystoscopy.  Blood pressure (!) 153/91, pulse 90, height 6' (1.829 m), weight 200 lb (90.7 kg). NED. A&Ox3.   No respiratory distress   Abd soft, NT, ND Normal phallus with bilateral descended testicles  Cystoscopy Procedure Note  Patient identification was confirmed, informed consent was obtained, and patient was prepped using Betadine solution.  Lidocaine jelly was administered per urethral meatus.     Pre-Procedure: - Inspection reveals a normal caliber urethral meatus.  Procedure: The flexible cystoscope was introduced without difficulty - No urethral strictures/lesions are present. - Prostate moderate lateral lobe enlargement with hypervascularity and friability  - moderate elevation bladder neck - Initial visualization of the bladder mucosa was suboptimal secondary to cloudy urine.  This was aspirated through the scope into the bladder was emptied and then redistended with saline - Bilateral ureteral orifices identified - Bladder mucosa  reveals no ulcers, tumors, or lesions - No bladder stones - No trabeculation  Retroflexion shows regular cystoscopy noted a standard negative.  Post-Procedure: - Patient tolerated the procedure well  Assessment/ Plan: 1. Microscopic hematuria -No mucosal abnormalities seen on cystoscoopy -BPH and hypervascularity noted which may be the etiology of his hematuria -Urine cytology sent  I, Botkins, am acting as a scribe for Dr. Nicki Reaper C. Dontrail Blackwell.  I have reviewed the above documentation for accuracy and completeness, and I agree with the above.   Abbie Sons, MD

## 2019-09-24 LAB — URINALYSIS, COMPLETE
Bilirubin, UA: NEGATIVE
Leukocytes,UA: NEGATIVE
Nitrite, UA: NEGATIVE
Specific Gravity, UA: 1.025 (ref 1.005–1.030)
Urobilinogen, Ur: 0.2 mg/dL (ref 0.2–1.0)
pH, UA: 5.5 (ref 5.0–7.5)

## 2019-09-24 LAB — MICROSCOPIC EXAMINATION
Bacteria, UA: NONE SEEN
RBC, Urine: >30 /hpf — AB (ref 0–2)

## 2019-09-25 LAB — CYTOLOGY - NON PAP

## 2019-09-28 ENCOUNTER — Telehealth: Payer: Self-pay

## 2019-09-28 LAB — PATHOLOGY

## 2019-09-28 NOTE — Telephone Encounter (Signed)
[facility] notifying patient of results.

## 2019-09-28 NOTE — Telephone Encounter (Signed)
-----   Message from Abbie Sons, MD sent at 09/28/2019  9:00 AM EDT ----- Urine cytology showed no abnormal cells

## 2019-10-07 ENCOUNTER — Telehealth (INDEPENDENT_AMBULATORY_CARE_PROVIDER_SITE_OTHER): Payer: Self-pay

## 2019-10-07 NOTE — Telephone Encounter (Signed)
[facility]([place] called to see if the patient would need to be pre-med before getting his teeth clean. The patient had informed them that he had stents and also the patient was at the dentist at that time. I spoke with Eulogio Ditch NP and she recommended that the patient was safe to get his teeth clean

## 2019-10-09 ENCOUNTER — Ambulatory Visit: Payer: 59 | Admitting: Internal Medicine

## 2019-10-14 ENCOUNTER — Encounter (HOSPITAL_COMMUNITY)
Admission: RE | Admit: 2019-10-14 | Discharge: 2019-10-14 | Disposition: A | Discharge status: Home / Self Care | Payer: 59 | Source: Ambulatory Visit | Attending: Surgery | Admitting: Surgery

## 2019-10-14 ENCOUNTER — Other Ambulatory Visit: Payer: Self-pay

## 2019-10-14 ENCOUNTER — Ambulatory Visit (HOSPITAL_COMMUNITY)
Admission: RE | Admit: 2019-10-14 | Discharge: 2019-10-14 | Disposition: A | Discharge status: Home / Self Care | Payer: 59 | Source: Ambulatory Visit | Attending: Surgery | Admitting: Surgery

## 2019-10-14 DIAGNOSIS — E210 Primary hyperparathyroidism: Secondary | ICD-10-CM | POA: Diagnosis present

## 2019-10-14 MED ORDER — TECHNETIUM TC 99M SESTAMIBI - CARDIOLITE
25.2000 | Freq: Once | INTRAVENOUS | Status: AC | PRN
  Administered 2019-10-14: 10:00:00 25.2 via INTRAVENOUS

## 2019-10-21 ENCOUNTER — Other Ambulatory Visit: Payer: Self-pay | Admitting: Surgery

## 2019-10-21 DIAGNOSIS — E210 Primary hyperparathyroidism: Secondary | ICD-10-CM

## 2019-10-23 ENCOUNTER — Other Ambulatory Visit: Payer: Self-pay

## 2019-10-23 ENCOUNTER — Ambulatory Visit (INDEPENDENT_AMBULATORY_CARE_PROVIDER_SITE_OTHER): Payer: 59 | Admitting: Cardiology

## 2019-10-23 ENCOUNTER — Encounter: Payer: Self-pay | Admitting: Cardiology

## 2019-10-23 VITALS — BP 140/92 | HR 76 | Ht 72.0 in | Wt 196.1 lb

## 2019-10-23 DIAGNOSIS — F17200 Nicotine dependence, unspecified, uncomplicated: Secondary | ICD-10-CM

## 2019-10-23 DIAGNOSIS — E7800 Pure hypercholesterolemia, unspecified: Secondary | ICD-10-CM | POA: Diagnosis not present

## 2019-10-23 DIAGNOSIS — I10 Essential (primary) hypertension: Secondary | ICD-10-CM

## 2019-10-23 DIAGNOSIS — I739 Peripheral vascular disease, unspecified: Secondary | ICD-10-CM

## 2019-10-23 MED ORDER — METOPROLOL SUCCINATE ER 50 MG PO TB24
50.0000 mg | ORAL_TABLET | Freq: Every day | ORAL | 6 refills | Status: DC
Start: 2019-10-23 — End: 2020-04-15

## 2019-10-23 NOTE — Patient Instructions (Signed)
Medication Instructions:  - Your physician has recommended you make the following change in your medication:   1) RESTART toprol XL (metoprolol succinate) 50 mg- take 1 tablet by mouth once daily   *If you need a refill on your cardiac medications before your next appointment, please call your pharmacy*   Lab Work: - Your physician recommends that you return for a FASTING lipid profile: 1 week - [place] entrance at [facility] - 1st desk on the right to check in (Registration) - Lab hours: Monday- Friday (7:30 am- 5:30 pm) - Do not eat/ drink for 8 hours prior except for water/ black coffee  If you have labs (blood work) drawn today and your tests are completely normal, you will receive your results only by: [name] MyChart Message (if you have MyChart) OR . A paper copy in the mail If you have any lab test that is abnormal or we need to change your treatment, we will call you to review the results.   Testing/Procedures: - none ordered   Follow-Up: At [facility], you and your health needs are our priority.  As part of our continuing mission to provide you with exceptional heart care, we have created designated Provider Care Teams.  These Care Teams include your primary Cardiologist (physician) and Advanced Practice Providers (APPs -  Physician Assistants and Nurse Practitioners) who all work together to provide you with the care you need, when you need it.  We recommend signing up for the patient portal called "MyChart".  Sign up information is provided on this After Visit Summary.  MyChart is used to connect with patients for Virtual Visits (Telemedicine).  Patients are able to view lab/test results, encounter notes, upcoming appointments, etc.  Non-urgent messages can be sent to your provider as well.   To learn more about what you can do with MyChart, go to [email or web address].    Your next appointment:   6 month(s)  The format for your next appointment:   In Person  Provider:    Kate Sable, MD   Other Instructions

## 2019-10-23 NOTE — Progress Notes (Signed)
Cardiology Office Note:    Date:  10/23/2019   ID:  Antonio Russell, DOB [birth year], MRN [id number]  PCP:  Abner Greenspan, MD  Cardiologist:  Kate Sable, MD  Electrophysiologist:  None   Referring MD: Abner Greenspan, MD   Chief Complaint  Patient presents with  . office visit    3 month F/U; Meds verbally reviewed with patient.    History of Present Illness:    Antonio Russell is a 59 y.o. male with a hx of hyperlipidemia, hypertension current smoker x40+ years, AAA s/p endoprosthetic repair 08/2019,  peripheral arterial disease s/p R and L iliac stents, sleep apnea who presents for follow-up.  Patient with history of hyperlipidemia, takes PCSK9 inhibitor due to statin intolerance.  Lipid panel was recommended but patient has not obtained yet.  He underwent a successful placement of Gore excluder for his AAA.  Stents were placed to his left and right iliac arteries.  He was on aspirin and Plavix but stop Plavix after having hematuria a month ago.  He has cut back on his smoking, smoking roughly 1 cigarette a week.  He otherwise is doing okay, Toprol-XL was stopped, current blood pressure in the 140s.    Past Medical History:  Diagnosis Date  . Allergic rhinitis   . Allergy   . Anxiety   . Arthritis   . Back pain   . Chest pain 2012   cardiac cath - nonobstructive, nl LV fxn, rec aggressive med management  . GERD (gastroesophageal reflux disease)   . Headache(784.0)   . HLD (hyperlipidemia)   . Hyperparathyroidism ([place])   . Hypertension   . Impotence of organic origin   . Other testicular hypofunction   . Personal history of urinary calculi   . Sleep apnea   . Tobacco use disorder   . Unspecified hearing loss     Past Surgical History:  Procedure Laterality Date  . COLON SURGERY    . COLONOSCOPY    . ENDOVASCULAR REPAIR/STENT GRAFT N/A 08/12/2019   Procedure: ENDOVASCULAR REPAIR/STENT GRAFT;  Surgeon: Algernon Huxley, MD;  Location: [place] [facility];  Service:  Cardiovascular;  Laterality: N/A;  . HEMORRHOID SURGERY    . JOINT REPLACEMENT    . KNEE SURGERY Right 2013  . NASAL SEPTUM SURGERY      Current Medications: Current Meds  Medication Sig  . Alirocumab (PRALUENT) 75 MG/ML SOAJ Inject 1 pen into the skin every 14 (fourteen) days.  [name] aspirin EC 81 MG tablet Take 1 tablet (81 mg total) by mouth daily.  . Cholecalciferol (VITAMIN D) 125 MCG (5000 UT) CAPS Take 1 capsule by mouth daily.   . fexofenadine (ALLEGRA) 60 MG tablet Take 60 mg by mouth daily.   . fluticasone (FLONASE) 50 MCG/ACT nasal spray SPRAY 2 SPRAYS INTO EACH NOSTRIL EVERY DAY  . meloxicam (MOBIC) 15 MG tablet TAKE 1 TABLET (15 MG TOTAL) BY MOUTH DAILY AS NEEDED FOR PAIN (JOINT PAIN). WITH FOOD     Allergies:   Crestor [rosuvastatin calcium], Lipitor [atorvastatin calcium], Sertraline hcl, Zocor [simvastatin - high dose], and Amlodipine   Social History   Socioeconomic History  . Marital status: Married    Spouse name: Not on file  . Number of children: Not on file  . Years of education: Not on file  . Highest education level: Not on file  Occupational History  . Not on file  Tobacco Use  . Smoking status: Current Some Day Smoker  Packs/day: 0.50    Years: 35.00    Pack years: 17.50    Types: Cigarettes  . Smokeless tobacco: Former Network engineer  . Vaping Use: Never used  Substance and Sexual Activity  . Alcohol use: Yes    Alcohol/week: 0.0 standard drinks    Comment: 6 pack 3 times weekly  . Drug use: No  . Sexual activity: Not on file  Other Topics Concern  . Not on file  Social History Narrative   Coaches basketball   Social Determinants of Health   Financial Resource Strain:   . Difficulty of Paying Living Expenses:   Food Insecurity:   . Worried About Charity fundraiser in the Last Year:   . Arboriculturist in the Last Year:   Transportation Needs:   . Film/video editor (Medical):   [name] Lack of Transportation (Non-Medical):    Physical Activity:   . Days of Exercise per Week:   . Minutes of Exercise per Session:   Stress:   . Feeling of Stress :   Social Connections:   . Frequency of Communication with Friends and Family:   . Frequency of Social Gatherings with Friends and Family:   . Attends Religious Services:   . Active Member of Clubs or Organizations:   . Attends Archivist Meetings:   [name] Marital Status:      Family History: The patient's family history includes Cancer in his father; Diabetes in his mother; Esophageal cancer in his paternal uncle; Heart disease in an other family member; Hyperlipidemia in his father and mother; Hypertension in his father, mother, and sister; Lung cancer in an other family member. There is no history of Colon cancer, Stomach cancer, or Rectal cancer.  ROS:   Please see the history of present illness.     All other systems reviewed and are negative.  EKGs/Labs/Other Studies Reviewed:    The following studies were reviewed today:   EKG:  EKG is  ordered today.  The ekg ordered today demonstrates sinus rhythm, normal ECG.  Recent Labs: 01/13/2019: ALT 27; TSH 1.12 06/12/2019: Magnesium 2.2 09/15/2019: BUN 23; Creatinine, Ser 1.05; Hemoglobin 13.5; Platelets 198; Potassium 4.5; Sodium 135  Recent Lipid Panel    Component Value Date/Time   CHOL 308 (H) 05/01/2019 0938   TRIG 235.0 (H) 05/01/2019 0938   HDL 52.40 05/01/2019 0938   CHOLHDL 6 05/01/2019 0938   VLDL 47.0 (H) 05/01/2019 0938   LDLCALC 155 (H) 10/26/2016 0826   LDLDIRECT 225.0 05/01/2019 0938    Physical Exam:    VS:  BP (!) 140/92 (BP Location: Left Arm, Patient Position: Sitting, Cuff Size: Normal)   Pulse 76   Ht 6' (1.829 m)   Wt 196 lb 2 oz (89 kg)   SpO2 98%   BMI 26.60 kg/m     Wt Readings from Last 3 Encounters:  10/23/19 196 lb 2 oz (89 kg)  09/23/19 200 lb (90.7 kg)  09/18/19 197 lb (89.4 kg)     GEN:  Well nourished, well developed in no acute distress HEENT:  Normal NECK: No JVD; No carotid bruits LYMPHATICS: No lymphadenopathy CARDIAC: RRR, no murmurs, rubs, gallops RESPIRATORY:  Clear to auscultation without rales, wheezing or rhonchi  ABDOMEN: Soft, non-tender, non-distended MUSCULOSKELETAL:  No edema; No deformity  SKIN: Warm and dry NEUROLOGIC:  Alert and oriented x 3 PSYCHIATRIC:  Normal affect   ASSESSMENT:    1. Pure hypercholesterolemia   2. PAD (  peripheral artery disease) ([place])   3. Essential hypertension   4. Smoking    PLAN:      1. Patient with history of hyperlipidemia.  His 10-year ASCVD risk score is 23.4%.  He is intolerant to statins.  Cont praluent.  Patient encouraged to obtain fasting lipid profile. 2. Patient history of peripheral artery disease, AAA status post endovascular repair and iliac stents placement.  Continue Praluent, continue aspirin.  Advised patient to follow-up with vascular surgery with regards to stopping Plavix reinstatement.  Will prefare Plavix over aspirin if patient is able to tolerate due to significant peripheral arterial disease. 3. History of hypertension.  Blood pressure elevated today, start Toprol-XL 50 mg daily. 4. History of smoking.  Patient has cut back to 1 cigarette a week, smoking cessation advised.   Follow-up in 6 months  Total encounter time 45 minutes  Greater than 50% was spent in counseling and coordination of care with the patient  This note was generated in part or whole with voice recognition software. Voice recognition is usually quite accurate but there are transcription errors that can and very often do occur. I apologize for any typographical errors that were not detected and corrected.  Medication Adjustments/Labs and Tests Ordered: Current medicines are reviewed at length with the patient today.  Concerns regarding medicines are outlined above.  Orders Placed This Encounter  Procedures  . Lipid Profile  . EKG 12-Lead   Meds ordered this encounter   Medications  . metoprolol succinate (TOPROL-XL) 50 MG 24 hr tablet    Sig: Take 1 tablet (50 mg total) by mouth daily. Take with or immediately following a meal.    Dispense:  30 tablet    Refill:  6    Patient Instructions  Medication Instructions:  - Your physician has recommended you make the following change in your medication:   1) RESTART toprol XL (metoprolol succinate) 50 mg- take 1 tablet by mouth once daily   *If you need a refill on your cardiac medications before your next appointment, please call your pharmacy*   Lab Work: - Your physician recommends that you return for a FASTING lipid profile: 1 week - [place] entrance at [facility] - 1st desk on the right to check in (Registration) - Lab hours: Monday- Friday (7:30 am- 5:30 pm) - Do not eat/ drink for 8 hours prior except for water/ black coffee  If you have labs (blood work) drawn today and your tests are completely normal, you will receive your results only by: [name] MyChart Message (if you have MyChart) OR . A paper copy in the mail If you have any lab test that is abnormal or we need to change your treatment, we will call you to review the results.   Testing/Procedures: - none ordered   Follow-Up: At [facility], you and your health needs are our priority.  As part of our continuing mission to provide you with exceptional heart care, we have created designated Provider Care Teams.  These Care Teams include your primary Cardiologist (physician) and Advanced Practice Providers (APPs -  Physician Assistants and Nurse Practitioners) who all work together to provide you with the care you need, when you need it.  We recommend signing up for the patient portal called "MyChart".  Sign up information is provided on this After Visit Summary.  MyChart is used to connect with patients for Virtual Visits (Telemedicine).  Patients are able to view lab/test results, encounter notes, upcoming appointments, etc.  Non-urgent  messages can be sent to your provider as well.   To learn more about what you can do with MyChart, go to [email or web address].    Your next appointment:   6 month(s)  The format for your next appointment:   In Person  Provider:   Kate Sable, MD   Other Instructions      Signed, Kate Sable, MD  10/23/2019 12:35 PM    [place]

## 2019-10-26 ENCOUNTER — Other Ambulatory Visit: Payer: Self-pay

## 2019-10-26 ENCOUNTER — Encounter: Payer: Self-pay | Admitting: Emergency Medicine

## 2019-10-26 ENCOUNTER — Telehealth: Payer: Self-pay

## 2019-10-26 ENCOUNTER — Emergency Department: Payer: 59

## 2019-10-26 ENCOUNTER — Emergency Department
Admission: EM | Admit: 2019-10-26 | Discharge: 2019-10-26 | Disposition: A | Discharge status: Home / Self Care | Payer: 59 | Attending: Emergency Medicine | Admitting: Emergency Medicine

## 2019-10-26 DIAGNOSIS — R1011 Right upper quadrant pain: Secondary | ICD-10-CM

## 2019-10-26 DIAGNOSIS — I2510 Atherosclerotic heart disease of native coronary artery without angina pectoris: Secondary | ICD-10-CM | POA: Insufficient documentation

## 2019-10-26 DIAGNOSIS — Z800 Family history of malignant neoplasm of digestive organs: Secondary | ICD-10-CM | POA: Insufficient documentation

## 2019-10-26 DIAGNOSIS — Z7982 Long term (current) use of aspirin: Secondary | ICD-10-CM | POA: Diagnosis not present

## 2019-10-26 DIAGNOSIS — I10 Essential (primary) hypertension: Secondary | ICD-10-CM | POA: Diagnosis not present

## 2019-10-26 DIAGNOSIS — K219 Gastro-esophageal reflux disease without esophagitis: Secondary | ICD-10-CM | POA: Diagnosis not present

## 2019-10-26 DIAGNOSIS — R1030 Lower abdominal pain, unspecified: Secondary | ICD-10-CM | POA: Insufficient documentation

## 2019-10-26 DIAGNOSIS — F17210 Nicotine dependence, cigarettes, uncomplicated: Secondary | ICD-10-CM | POA: Diagnosis not present

## 2019-10-26 DIAGNOSIS — Z801 Family history of malignant neoplasm of trachea, bronchus and lung: Secondary | ICD-10-CM | POA: Insufficient documentation

## 2019-10-26 DIAGNOSIS — Z79899 Other long term (current) drug therapy: Secondary | ICD-10-CM | POA: Insufficient documentation

## 2019-10-26 DIAGNOSIS — R3129 Other microscopic hematuria: Secondary | ICD-10-CM | POA: Diagnosis not present

## 2019-10-26 DIAGNOSIS — N201 Calculus of ureter: Secondary | ICD-10-CM | POA: Insufficient documentation

## 2019-10-26 DIAGNOSIS — R109 Unspecified abdominal pain: Secondary | ICD-10-CM

## 2019-10-26 LAB — COMPREHENSIVE METABOLIC PANEL
ALT: 32 U/L (ref 0–44)
AST: 28 U/L (ref 15–41)
Albumin: 3.9 g/dL (ref 3.5–5.0)
Alkaline Phosphatase: 102 U/L (ref 38–126)
Anion gap: 8 (ref 5–15)
BUN: 14 mg/dL (ref 6–20)
CO2: 25 mmol/L (ref 22–32)
Calcium: 10.3 mg/dL (ref 8.9–10.3)
Chloride: 104 mmol/L (ref 98–111)
Creatinine, Ser: 1.09 mg/dL (ref 0.61–1.24)
GFR calc Af Amer: >60 mL/min (ref >60)
GFR calc non Af Amer: >60 mL/min (ref >60)
Glucose, Bld: 200 mg/dL — ABNORMAL HIGH (ref 70–99)
Potassium: 3.9 mmol/L (ref 3.5–5.1)
Sodium: 137 mmol/L (ref 135–145)
Total Bilirubin: 0.8 mg/dL (ref 0.3–1.2)
Total Protein: 7.2 g/dL (ref 6.5–8.1)

## 2019-10-26 LAB — URINALYSIS, COMPLETE (UACMP) WITH MICROSCOPIC
Bacteria, UA: NONE SEEN
Bilirubin Urine: NEGATIVE
Glucose, UA: 150 mg/dL — AB
Ketones, ur: NEGATIVE mg/dL
Leukocytes,Ua: NEGATIVE
Nitrite: NEGATIVE
Protein, ur: NEGATIVE mg/dL
Specific Gravity, Urine: 1.019 (ref 1.005–1.030)
Squamous Epithelial / HPF: NONE SEEN (ref 0–5)
pH: 5 (ref 5.0–8.0)

## 2019-10-26 LAB — CBC
HCT: 43.4 % (ref 39.0–52.0)
Hemoglobin: 15 g/dL (ref 13.0–17.0)
MCH: 33.6 pg (ref 26.0–34.0)
MCHC: 34.6 g/dL (ref 30.0–36.0)
MCV: 97.1 fL (ref 80.0–100.0)
Platelets: 243 10*3/uL (ref 150–400)
RBC: 4.47 MIL/uL (ref 4.22–5.81)
RDW: 13.6 % (ref 11.5–15.5)
WBC: 9.4 10*3/uL (ref 4.0–10.5)
nRBC: 0 % (ref 0.0–0.2)

## 2019-10-26 LAB — LIPASE, BLOOD: Lipase: 37 U/L (ref 11–51)

## 2019-10-26 MED ORDER — ONDANSETRON 4 MG PO TBDP
4.0000 mg | ORAL_TABLET | Freq: Three times a day (TID) | ORAL | 0 refills | Status: DC | PRN
Start: 2019-10-26 — End: 2020-01-05

## 2019-10-26 MED ORDER — OXYCODONE-ACETAMINOPHEN 5-325 MG PO TABS: 1.0000 | ORAL_TABLET | Freq: Four times a day (QID) | ORAL | 0 refills | Status: AC | PRN

## 2019-10-26 NOTE — Telephone Encounter (Signed)
[facility] - Client TELEPHONE ADVICE RECORD [facility] Patient Name: Antonio Russell Gender: Male DOB: [birth year] Age: 59 Y 20 M 7 D Return Phone Number: [phone] (Primary), [phone] (Secondary) Address: City/State/Zip: [place] [address] Client [facility] - Client Client Site [place] Glori Bickers, Roque Lias - MD Contact Type Call Who Is Calling Patient / Member / Family / Caregiver Call Type Triage / Clinical Relationship To Patient Self Return Phone Number [phone] (Secondary) Chief Complaint Abdominal Pain Reason for Call Symptomatic / Request for [place] states he is experiencing pain in his right upper quadrant of his stomach. The pain level is at 8. He doesn't have shortness of breath, chest pain, or nausea. He wants to know where to go. He said it isn't severe now. He's had this pain on and off for a few weeks. Translation No Nurse Assessment Nurse: Laurena Bering, RN, Helene Kelp Date/Time Eilene Ghazi Time): 10/26/2019 10:22:23 AM Confirm and document reason for call. If symptomatic, describe symptoms. ---Caller states that he is having RUQ pain off and on for a few weeks. Denies vomiting and fever. Rates pain as 8/10 Has the patient had close contact with a person known or suspected to have the novel coronavirus illness OR traveled / lives in area with major community spread (including international travel) in the last 14 days from the onset of symptoms? * If Asymptomatic, screen for exposure and travel within the last 14 days. ---No Does the patient have any new or worsening symptoms? ---Yes Will a triage be completed? ---Yes Related visit to physician within the last 2 weeks? ---No Does the PT have any chronic conditions? (i.e. diabetes, asthma, this includes High risk factors for pregnancy, etc.) ---Yes List chronic conditions. ---elevate  cholesterol, HTN, allergies Is this a behavioral health or substance abuse call? ---No Guidelines Guideline Title Affirmed Question Affirmed Notes Nurse Date/Time Eilene Ghazi Time) Abdominal Pain - Upper [1] MILD-MODERATE pain AND [2] constant AND [3] present > 2 hours Laurena Bering, RN, Helene Kelp 10/26/2019 10:24:12 AM PLEASE NOTE: All timestamps contained within this report are represented as [place] Standard Time. CONFIDENTIALTY NOTICE: This fax transmission is intended only for the addressee. It contains information that is legally privileged, confidential or otherwise protected from use or disclosure. If you are not the intended recipient, you are strictly prohibited from reviewing, disclosing, copying using or disseminating any of this information or taking any action in reliance on or regarding this information. If you have received this fax in error, please notify us immediately by telephone so that we can arrange for its return to [place]. Phone: [phone], Toll-Free: [phone], Fax: [phone] Page: 2 of 2 Call Id: [id number] [place]. Time Eilene Ghazi Time) Disposition Final User 10/26/2019 10:09:16 AM Attempt made - message left Milton Ferguson 10/26/2019 10:25:42 AM See HCP within 4 Hours (or PCP triage) Yes Laurena Bering, RN, [place] Artist Disagree/Comply Comply Caller Understands Yes PreDisposition Call Doctor Care Advice Given Per Guideline SEE HCP WITHIN 4 HOURS (OR PCP TRIAGE): * You become worse. CALL BACK IF: CARE ADVICE given per Abdominal Pain, Upper (Adult) guideline. Comments User: Dorothyann Peng, RN Date/Time Eilene Ghazi Time): 10/26/2019 10:32:49 AM No available appointments available. Referred to ER. Referrals Warm transfer to backline

## 2019-10-26 NOTE — ED Provider Notes (Signed)
[facility] Emergency Department Provider Note  ____________________________________________  Time seen: Approximately 2:05 PM  I have reviewed the triage vital signs and the nursing notes.   HISTORY  Chief Complaint Abdominal Pain    HPI Antonio Russell is a 59 y.o. male with a history of GERD hyperlipidemia hypertension who comes the ED complaining of right upper quadrant pain radiating to the back for the past 3 weeks.  It is intermittent, worse after eating, no alleviating factors.  No fevers chills vomiting or diarrhea.  Unable to pinpoint particular foods that make it worse.  Reports that over the past 3 weeks, the pain is steadily worsening with each episode.  Currently, pain is 1/10 and only feeling a dull ache in the back.  2 months ago he had endovascular stent repair of an abdominal aortic aneurysm.  Was initially on Plavix and aspirin combination therapy, but then had hematuria without clots and so both were discontinued.  He had a cystoscopy which was normal.  He reports that he is talked to cardiology and vascular surgery about restarting either of these medications and been referred back to primary care.        Past Medical History:  Diagnosis Date  . Allergic rhinitis   . Allergy   . Anxiety   . Arthritis   . Back pain   . Chest pain 2012   cardiac cath - nonobstructive, nl LV fxn, rec aggressive med management  . GERD (gastroesophageal reflux disease)   . Headache(784.0)   . HLD (hyperlipidemia)   . Hyperparathyroidism ([place])   . Hypertension   . Impotence of organic origin   . Other testicular hypofunction   . Personal history of urinary calculi   . Sleep apnea   . Tobacco use disorder   . Unspecified hearing loss      Patient Active Problem List   Diagnosis Date Noted  . AAA (abdominal aortic aneurysm) without rupture ([place]) 07/14/2019  . Atherosclerosis of native arteries of extremity with intermittent claudication ([place])  07/14/2019  . Multiple joint pain 05/01/2019  . Primary hyperparathyroidism ([place]) 01/14/2019  . Vitamin D deficiency 01/14/2019  . Left knee pain 12/01/2018  . Left foot pain 12/01/2018  . Chronic daily headache 12/06/2017  . Hypercalcemia 11/08/2017  . Headache 11/04/2017  . External hemorrhoid 11/05/2016  . Alcohol use 11/04/2016  . Prediabetes 11/02/2016  . Skin lesion of left leg 11/02/2016  . Routine general medical examination at a health care facility 10/25/2016  . Prostate cancer screening 10/25/2016  . Leukocytosis 11/30/2014  . Special screening for malignant neoplasms, colon 02/16/2011  . Arthritis   . Essential hypertension 07/13/2010  . CAD (coronary artery disease)   . Testosterone deficiency 05/25/2009  . Hyperlipidemia 05/25/2009  . 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 RENAL CALCULUS, HX OF 05/11/2009  . TOBACCO USE 09/12/2007  . LOSS, HEARING NOS 10/10/2006     Past Surgical History:  Procedure Laterality Date  . COLON SURGERY    . COLONOSCOPY    . ENDOVASCULAR REPAIR/STENT GRAFT N/A 08/12/2019   Procedure: ENDOVASCULAR REPAIR/STENT GRAFT;  Surgeon: Algernon Huxley, MD;  Location: [facility];  Service: Cardiovascular;  Laterality: N/A;  . HEMORRHOID SURGERY    . JOINT REPLACEMENT    . KNEE SURGERY Right 2013  . NASAL SEPTUM SURGERY       Prior to Admission medications   Medication Sig Start Date End Date Taking? Authorizing Provider  Alirocumab (PRALUENT) 75 MG/ML SOAJ  Inject 1 pen into the skin every 14 (fourteen) days. 06/08/19   Kate Sable, MD  aspirin EC 81 MG tablet Take 1 tablet (81 mg total) by mouth daily. 07/10/19   Kate Sable, MD  cetirizine (ZYRTEC) 10 MG tablet Take 10 mg by mouth daily.     [provider]  CHANTIX 1 MG tablet TAKE 1 TABLET (1 MG TOTAL) BY MOUTH 2 (TWO) TIMES DAILY. START AFTER STARTER PACK 07/29/19   Tower, Wynelle Fanny, MD  Cholecalciferol (VITAMIN D) 125 MCG (5000 UT) CAPS Take 1 capsule by  mouth daily.     [provider]  clopidogrel (PLAVIX) 75 MG tablet Take 1 tablet (75 mg total) by mouth daily at 6 (six) AM. 08/14/19   Stegmayer, Joelene Millin A, PA-C  diazepam (VALIUM) 10 MG tablet 1 tab po 30 min prior to procedure 09/21/19   Stoioff, Ronda Fairly, MD  fexofenadine (ALLEGRA) 60 MG tablet Take 60 mg by mouth daily.     [provider]  fexofenadine (ALLEGRA) 60 MG tablet Take 60 mg by mouth 2 (two) times daily.    [provider]  fluticasone (FLONASE) 50 MCG/ACT nasal spray SPRAY 2 SPRAYS INTO EACH NOSTRIL EVERY DAY 12/31/17   Carollee Herter, Alferd Apa, DO  HYDROcodone-acetaminophen (NORCO/VICODIN) 5-325 MG tablet 1 tablet by mouth 30 minutes prior to procedure 09/21/19   Stoioff, Ronda Fairly, MD  meloxicam (MOBIC) 15 MG tablet TAKE 1 TABLET (15 MG TOTAL) BY MOUTH DAILY AS NEEDED FOR PAIN (JOINT PAIN). WITH FOOD 05/07/19   Tower, Wynelle Fanny, MD  metoprolol succinate (TOPROL-XL) 50 MG 24 hr tablet Take 1 tablet (50 mg total) by mouth daily. Take with or immediately following a meal. 10/23/19 01/21/20  Kate Sable, MD  ondansetron (ZOFRAN ODT) 4 MG disintegrating tablet Take 1 tablet (4 mg total) by mouth every 8 (eight) hours as needed for nausea or vomiting. 10/26/19   Carrie Mew, MD  oxyCODONE-acetaminophen (PERCOCET) 5-325 MG tablet Take 1 tablet by mouth every 6 (six) hours as needed for up to 3 days for severe pain. 10/26/19 10/29/19  Carrie Mew, MD  varenicline (CHANTIX STARTING MONTH PAK) 0.5 MG X 11 & 1 MG X 42 tablet TAKE AS DIRECTED ON PACKAGE 12/26/18   Tower, Wynelle Fanny, MD  Vitamin D, Ergocalciferol, (DRISDOL) 1.25 MG (50000 UNIT) CAPS capsule Take 1 capsule (50,000 Units total) by mouth every 7 (seven) days. For 12 weeks 05/05/19   Tower, Wynelle Fanny, MD     Allergies Sertraline hcl, Amlodipine, Crestor [rosuvastatin calcium], Lipitor [atorvastatin calcium], and Zocor [simvastatin - high dose]   Family History  Problem Relation Age of Onset  .  Hypertension Mother   . Hyperlipidemia Mother   . Diabetes Mother   . Hypertension Father   . Hyperlipidemia Father   . Cancer Father        throat  . Hypertension Sister   . Lung cancer Other        GF  . Heart disease Other        GM  . Esophageal cancer Paternal Uncle   . Colon cancer Neg Hx   . Stomach cancer Neg Hx   . Rectal cancer Neg Hx     Social History Social History   Tobacco Use  . Smoking status: Current Some Day Smoker    Packs/day: 0.25    Years: 35.00    Pack years: 8.75    Types: Cigarettes  . Smokeless tobacco: Former Network engineer  .  Vaping Use: Never used  Substance Use Topics  . Alcohol use: Yes    Alcohol/week: 18.0 standard drinks    Types: 18 Cans of beer per week  . Drug use: No    Review of Systems  Constitutional:   No fever or chills.  ENT:   No sore throat. No rhinorrhea. Cardiovascular:   No chest pain or syncope. Respiratory:   No dyspnea or cough. Gastrointestinal:   Negative for abdominal pain, vomiting and diarrhea.  Musculoskeletal:   Negative for focal pain or swelling All other systems reviewed and are negative except as documented above in ROS and HPI.  ____________________________________________   PHYSICAL EXAM:  VITAL SIGNS: ED Triage Vitals  Enc Vitals Group     BP 10/26/19 1142 (!) 146/98     Pulse Rate 10/26/19 1142 102     Resp 10/26/19 1142 17     Temp 10/26/19 1142 97.8 F (36.6 C)     Temp Source 10/26/19 1142 Oral     SpO2 10/26/19 1142 99 %     Weight 10/26/19 1149 196 lb (88.9 kg)     Height 10/26/19 1149 6' (1.829 m)     Head Circumference --      Peak Flow --      Pain Score 10/26/19 1149 3     Pain Loc --      Pain Edu? --      Excl. in [place]? --     Vital signs reviewed, nursing assessments reviewed.   Constitutional:   Alert and oriented. Non-toxic appearance. Eyes:   Conjunctivae are normal. EOMI. PERRL. ENT      Head:   Normocephalic and atraumatic.      Nose:   Wearing a mask.       Mouth/Throat:   Wearing a mask.      Neck:   No meningismus. Full ROM. Hematological/Lymphatic/Immunilogical:   No cervical lymphadenopathy. Cardiovascular:   RRR. Symmetric bilateral radial and DP pulses.  No murmurs. Cap refill less than 2 seconds. Respiratory:   Normal respiratory effort without tachypnea/retractions. Breath sounds are clear and equal bilaterally. No wheezes/rales/rhonchi. Gastrointestinal:   Soft and nontender.  Negative Murphy sign.  Non distended. There is no CVA tenderness.  No rebound, rigidity, or guarding.  No tenderness at McBurney's point  Musculoskeletal:   Normal range of motion in all extremities. No joint effusions.  No lower extremity tenderness.  No edema. Neurologic:   Normal speech and language.  Motor grossly intact. No acute focal neurologic deficits are appreciated.  Skin:    Skin is warm, dry and intact. No rash noted.  No petechiae, purpura, or bullae.  ____________________________________________    LABS (pertinent positives/negatives) (all labs ordered are listed, but only abnormal results are displayed) Labs Reviewed  COMPREHENSIVE METABOLIC PANEL - Abnormal; Notable for the following components:      Result Value   Glucose, Bld 200 (*)    All other components within normal limits  URINALYSIS, COMPLETE (UACMP) WITH MICROSCOPIC - Abnormal; Notable for the following components:   Color, Urine YELLOW (*)    APPearance HAZY (*)    Glucose, UA 150 (*)    Hgb urine dipstick MODERATE (*)    All other components within normal limits  LIPASE, BLOOD  CBC   ____________________________________________   EKG    ____________________________________________    RADIOLOGY  DG Abdomen 1 View  Result Date: 10/26/2019 CLINICAL DATA:  Right flank pain. Ureteral calculus seen on ultrasound. EXAM: ABDOMEN -  1 VIEW COMPARISON:  Abdominal ultrasound earlier this day. Abdominal CTA 07/24/2019 FINDINGS: Ureteral calculus on ultrasound is not well  visualized by radiograph. Overlying stool and bowel gas partially obscures evaluation of the right proximal ureter. Normal bowel gas pattern without obstruction. Aorto bi-iliac stent graft in place. IMPRESSION: Right ureteral calculus on ultrasound is not well visualized by radiograph. Electronically Signed   By: Keith Rake M.D.   On: 10/26/2019 17:01   US Abdomen Complete  Result Date: 10/26/2019 CLINICAL DATA:  Right upper quadrant pain after eating. Pain for 3 weeks. EXAM: ABDOMEN ULTRASOUND COMPLETE COMPARISON:  Abdominal CTA 07/24/2019 FINDINGS: Gallbladder: Physiologically distended. No gallstones or wall thickening visualized. No sonographic Murphy sign noted by sonographer. Common bile duct: Diameter: 4 mm, normal. Liver: 1.3 cm cyst in the left lobe of the liver. Background diffuse increase in parenchymal echogenicity. Portal vein is patent on color Doppler imaging with normal direction of blood flow towards the liver. IVC: No abnormality visualized. Pancreas: Visualized portion unremarkable. Spleen: Size and appearance within normal limits. Right Kidney: Length: 11.3 cm. Mild hydronephrosis and dilatation of the renal pelvis. There is a 1.2 cm shadowing calculus in the upper right ureter just beyond the ureteropelvic junction. Parenchymal echogenicity is normal. Left Kidney: Length: 13.2 cm. Echogenicity within normal limits. No mass or hydronephrosis visualized. No visualized renal calculi. Abdominal aorta: Distal abdominal aorta is not well visualized due to overlying bowel gas. Patient had recent aortic stent placement. Aneurysm sac is obscured on the current exam. The proximal aorta is normal in caliber. Other findings: No right upper quadrant ascites. IMPRESSION: 1. Mild right hydronephrosis with 1.2 cm stone in the upper right ureter. 2. Normal sonographic appearance of the gallbladder.  No gallstones. 3. Hepatic steatosis. 4. Distal aorta is obscured by overlying bowel gas, obscuring prior  aortic aneurysm. Patient is post recent aortic stent graft, not well assessed on the current exam. Electronically Signed   By: Keith Rake M.D.   On: 10/26/2019 15:07    ____________________________________________   PROCEDURES Procedures  ____________________________________________  DIFFERENTIAL DIAGNOSIS   Biliary colic, GERD, intestinal gas  CLINICAL IMPRESSION / ASSESSMENT AND PLAN / ED COURSE  Medications ordered in the ED: Medications - No data to display  Pertinent labs & imaging results that were available during my care of the patient were reviewed by me and considered in my medical decision making (see chart for details).  Antonio Russell was evaluated in Emergency Department on 10/26/2019 for the symptoms described in the history of present illness. He was evaluated in the context of the global COVID-19 pandemic, which necessitated consideration that the patient might be at risk for infection with the SARS-CoV-2 virus that causes COVID-19. Institutional protocols and algorithms that pertain to the evaluation of patients at risk for COVID-19 are in a state of rapid change based on information released by regulatory bodies including the CDC and federal and state organizations. These policies and algorithms were followed during the patient's care in the ED.   Patient presents with postprandial right upper quadrant pain.  Vital signs are normal, exam is benign and reassuring.  High suspicion for cholelithiasis.  Will obtain an ultrasound to further evaluate.  Doubt thrombosis of his aortic stent given lack of lower extremity symptoms or severe pain out of proportion.  After reviewing EMR, seeing that he had no clots in his urine, no drop in hemoglobin, and a normal cystoscopy completed, I think it in his best interest to restart the Plavix  for now until he talks to primary care and see if he should continue aspirin as well.  Counseled the patient on this  advice.  ----------------------------------------- 5:20 PM on 10/26/2019 -----------------------------------------  Ultrasound shows a 1.2 cm stone in his right proximal ureter causing mild hydronephrosis.  Discussed with urology who will work him into the clinic in 2 days in anticipation of shockwave lithotripsy in 3 days.  Hold aspirin Plavix NSAIDs and any other anticoagulants in the meantime.  Pain is well controlled currently, and have sent a prescription for oxycodone to his pharmacy.      ____________________________________________   FINAL CLINICAL IMPRESSION(S) / ED DIAGNOSES    Final diagnoses:  Ureterolithiasis  Right flank pain  Microscopic hematuria     ED Discharge Orders         Ordered    oxyCODONE-acetaminophen (PERCOCET) 5-325 MG tablet  Every 6 hours PRN     Discontinue  Reprint     10/26/19 1718    ondansetron (ZOFRAN ODT) 4 MG disintegrating tablet  Every 8 hours PRN     Discontinue  Reprint     10/26/19 1718          Portions of this note were generated with dragon dictation software. Dictation errors may occur despite best attempts at proofreading.   Carrie Mew, MD 10/26/19 1721

## 2019-10-26 NOTE — Telephone Encounter (Signed)
I will watch for ER correspondence, thanks

## 2019-10-26 NOTE — Telephone Encounter (Signed)
Per chart review tab pt is presently at [facility] ED. FYI to Dr Glori Bickers.

## 2019-10-26 NOTE — ED Triage Notes (Signed)
Pt in via POV, reports intermittent RUQ abdominal pain x approximately 3 weeks, pain is worse with eating certain foods.  Denies any N/V/D.  Vitals WDL, NAD at this time.

## 2019-10-26 NOTE — ED Notes (Signed)
E-signature not working at this time. Pt verbalized understanding of D/C instructions, prescriptions and follow up care with no further questions at this time. Pt in NAD and ambulatory at time of D/C.  

## 2019-10-26 NOTE — Discharge Instructions (Signed)
Do not take any NSAID pain medication or blood thinners for the next 3 days until you follow up with Urology about possible lithotripsy on Thursday.  When you see Dr. Bernardo Heater on Wedesday, ask how long you should wait after lithotripsy to restart your plavix as we discussed.    Medications to avoid for now include aspirin, plavix, ibuprofen, Motrin, Advil, naproxen, Aleve, ketorolac, Toradol.

## 2019-10-28 ENCOUNTER — Other Ambulatory Visit
Admission: RE | Admit: 2019-10-28 | Discharge: 2019-10-28 | Disposition: A | Discharge status: Home / Self Care | Payer: 59 | Source: Home / Self Care | Attending: Cardiology | Admitting: Cardiology

## 2019-10-28 ENCOUNTER — Ambulatory Visit (INDEPENDENT_AMBULATORY_CARE_PROVIDER_SITE_OTHER): Payer: 59 | Admitting: Urology

## 2019-10-28 ENCOUNTER — Other Ambulatory Visit: Payer: Self-pay

## 2019-10-28 ENCOUNTER — Ambulatory Visit
Admission: RE | Admit: 2019-10-28 | Discharge: 2019-10-28 | Disposition: A | Discharge status: Home / Self Care | Payer: 59 | Source: Ambulatory Visit | Attending: Urology | Admitting: Urology

## 2019-10-28 ENCOUNTER — Other Ambulatory Visit: Payer: Self-pay | Admitting: Radiology

## 2019-10-28 ENCOUNTER — Encounter: Payer: Self-pay | Admitting: Urology

## 2019-10-28 VITALS — BP 150/89 | HR 85 | Ht 72.0 in | Wt 195.0 lb

## 2019-10-28 DIAGNOSIS — N201 Calculus of ureter: Secondary | ICD-10-CM

## 2019-10-28 DIAGNOSIS — N23 Unspecified renal colic: Secondary | ICD-10-CM | POA: Diagnosis not present

## 2019-10-28 DIAGNOSIS — N200 Calculus of kidney: Secondary | ICD-10-CM

## 2019-10-28 DIAGNOSIS — E7800 Pure hypercholesterolemia, unspecified: Secondary | ICD-10-CM | POA: Insufficient documentation

## 2019-10-28 DIAGNOSIS — N132 Hydronephrosis with renal and ureteral calculous obstruction: Secondary | ICD-10-CM

## 2019-10-28 LAB — LIPID PANEL
Cholesterol: 185 mg/dL (ref 0–200)
HDL: 56 mg/dL (ref >40)
LDL Cholesterol: 84 mg/dL (ref 0–99)
Total CHOL/HDL Ratio: 3.3 RATIO
Triglycerides: 224 mg/dL — ABNORMAL HIGH (ref <150)
VLDL: 45 mg/dL — ABNORMAL HIGH (ref 0–40)

## 2019-10-28 NOTE — Progress Notes (Signed)
10/28/2019 8:53 AM   Antonio Russell 1960/04/28 [id number]  Referring provider: Abner Greenspan, MD [address] [place],  [place] [address]  Chief Complaint  Patient presents with  . Hematuria    HPI: Antonio Russell is a 59 y.o. male who presents for an ER follow up.   -Seen [facility] ED on 10/26/2019 for right upper quadrant pain radiating to the right flank  - Abdomina ultrasound was performed which showed a 12 mm right proximal ureteral calculus and hydronephrosis -Pain is improved and intermittent  -No longer on Plavix; stopped  his aspirin on Monday -He has untreated hyperparathyroidism and hypercalcemia but denies recurrent calculi -Recent evaluation for hematuria; calculus not seen on CT however was visualized today in retrospect -Cystoscopy unremarkable -KUB 7/26 showed a large amount of stool and bowel gas and the calculus not definitely seen   PMH: Past Medical History:  Diagnosis Date  . Allergic rhinitis   . Allergy   . Anxiety   . Arthritis   . Back pain   . Chest pain 2012   cardiac cath - nonobstructive, nl LV fxn, rec aggressive med management  . GERD (gastroesophageal reflux disease)   . Headache(784.0)   . HLD (hyperlipidemia)   . Hyperparathyroidism ([place])   . Hypertension   . Impotence of organic origin   . Other testicular hypofunction   . Personal history of urinary calculi   . Sleep apnea   . Tobacco use disorder   . Unspecified hearing loss     Surgical History: Past Surgical History:  Procedure Laterality Date  . COLON SURGERY    . COLONOSCOPY    . ENDOVASCULAR REPAIR/STENT GRAFT N/A 08/12/2019   Procedure: ENDOVASCULAR REPAIR/STENT GRAFT;  Surgeon: Algernon Huxley, MD;  Location: [facility];  Service: Cardiovascular;  Laterality: N/A;  . HEMORRHOID SURGERY    . JOINT REPLACEMENT    . KNEE SURGERY Right 2013  . NASAL SEPTUM SURGERY      Home Medications:  Allergies as of 10/28/2019      Reactions   Sertraline Hcl Other (See  Comments)   Tremors   Amlodipine Other (See Comments)   Sexual side effects   Crestor [rosuvastatin Calcium] Other (See Comments)   Myalgia   Lipitor [atorvastatin Calcium] Other (See Comments)   Myalgia   Zocor [simvastatin - High Dose] Other (See Comments)   Myalgia      Medication List       Accurate as of October 28, 2019  8:53 AM. If you have any questions, ask your nurse or doctor.        aspirin EC 81 MG tablet Take 1 tablet (81 mg total) by mouth daily.   cetirizine 10 MG tablet Commonly known as: ZYRTEC Take 10 mg by mouth daily.   Chantix Starting Month Pak 0.5 MG X 11 & 1 MG X 42 tablet Generic drug: varenicline TAKE AS DIRECTED ON PACKAGE   Chantix 1 MG tablet Generic drug: varenicline TAKE 1 TABLET (1 MG TOTAL) BY MOUTH 2 (TWO) TIMES DAILY. START AFTER STARTER PACK   clopidogrel 75 MG tablet Commonly known as: PLAVIX Take 1 tablet (75 mg total) by mouth daily at 6 (six) AM.   diazepam 10 MG tablet Commonly known as: VALIUM 1 tab po 30 min prior to procedure   fexofenadine 60 MG tablet Commonly known as: ALLEGRA Take 60 mg by mouth daily.   fexofenadine 60 MG tablet Commonly known as: ALLEGRA Take 60  mg by mouth 2 (two) times daily.   fluticasone 50 MCG/ACT nasal spray Commonly known as: FLONASE SPRAY 2 SPRAYS INTO EACH NOSTRIL EVERY DAY   HYDROcodone-acetaminophen 5-325 MG tablet Commonly known as: NORCO/VICODIN 1 tablet by mouth 30 minutes prior to procedure   meloxicam 15 MG tablet Commonly known as: MOBIC TAKE 1 TABLET (15 MG TOTAL) BY MOUTH DAILY AS NEEDED FOR PAIN (JOINT PAIN). WITH FOOD   metoprolol succinate 50 MG 24 hr tablet Commonly known as: TOPROL-XL Take 1 tablet (50 mg total) by mouth daily. Take with or immediately following a meal.   ondansetron 4 MG disintegrating tablet Commonly known as: Zofran ODT Take 1 tablet (4 mg total) by mouth every 8 (eight) hours as needed for nausea or vomiting.   oxyCODONE-acetaminophen  5-325 MG tablet Commonly known as: Percocet Take 1 tablet by mouth every 6 (six) hours as needed for up to 3 days for severe pain.   Praluent 75 MG/ML Soaj Generic drug: Alirocumab Inject 1 pen into the skin every 14 (fourteen) days.   Vitamin D (Ergocalciferol) 1.25 MG (50000 UNIT) Caps capsule Commonly known as: DRISDOL Take 1 capsule (50,000 Units total) by mouth every 7 (seven) days. For 12 weeks   Vitamin D 125 MCG (5000 UT) Caps Take 1 capsule by mouth daily.       Allergies:  Allergies  Allergen Reactions  . Sertraline Hcl Other (See Comments)    Tremors  . Amlodipine Other (See Comments)    Sexual side effects  . Crestor [Rosuvastatin Calcium] Other (See Comments)    Myalgia  . Lipitor [Atorvastatin Calcium] Other (See Comments)    Myalgia  . Zocor [Simvastatin - High Dose] Other (See Comments)    Myalgia     Family History: Family History  Problem Relation Age of Onset  . Hypertension Mother   . Hyperlipidemia Mother   . Diabetes Mother   . Hypertension Father   . Hyperlipidemia Father   . Cancer Father        throat  . Hypertension Sister   . Lung cancer Other        GF  . Heart disease Other        GM  . Esophageal cancer Paternal Uncle   . Colon cancer Neg Hx   . Stomach cancer Neg Hx   . Rectal cancer Neg Hx     Social History:  reports that he has been smoking cigarettes. He has a 8.75 pack-year smoking history. He has quit using smokeless tobacco. He reports current alcohol use of about 18.0 standard drinks of alcohol per week. He reports that he does not use drugs.   Physical Exam: BP (!) 150/89   Pulse 85   Ht 6' (1.829 m)   Wt 195 lb (88.5 kg)   BMI 26.45 kg/m   Constitutional:  Alert and oriented, No acute distress. HEENT: [place] AT, moist mucus membranes.  Trachea midline, no masses. Cardiovascular: No clubbing, cyanosis, or edema.  RRR Respiratory: Normal respiratory effort, no increased work of breathing.  Clear Skin: No rashes,  bruises or suspicious lesions. Neurologic: Grossly intact, no focal deficits, moving all 4 extremities. Psychiatric: Normal mood and affect.  Imaging: KUB performed today is reviewed and the calculus is visible  Assessment & Plan:    1.  Right proximal ureteral calculus -Recent abdominal ultrasound with hydronephrosis/proximal hydroureter and calculus secondary to 12 mm proximal ureteral calculus -Based on size unlikely to pass. We discussed various treatment options of  shockwave lithotripsy (SWL) and ureteroscopy and laser lithotripsy with stent placement  -SWL has a lower stone free rate in a single procedure, but also a lower complication rate compared to ureteroscopy and avoids a stent and associated stent related symptoms. Possible complications include renal hematoma, steinstrasse, and need for additional treatment.  -Ureteroscopy with laser lithotripsy and stent placement has a higher stone free rate than SWL in a single procedure, however increased complication rate including possible infection, ureteral injury, bleeding, and stent related morbidity. Common stent related symptoms include dysuria, urgency/frequency, and flank pain.  -After an extensive discussion of the risks and benefits of the above treatment options, the patient would like to proceed with shockwave lithotripsy.  Patient was informed that Dr. Diamantina Providence is the treating physician this week.   [facility] [address] [place], [place] [address] [phone]  I, Joneen Boers Peace, am acting as a Education administrator for Dr. Nicki Reaper C. Harmon Bommarito.  I have reviewed the above documentation for accuracy and completeness, and I agree with the above.   Abbie Sons, MD

## 2019-10-28 NOTE — H&P (View-Only) (Signed)
10/28/2019 8:53 AM   Antonio Russell [birth year] [id number]  Referring provider: Abner Greenspan, MD [address] [place],  [place] [address]  Chief Complaint  Patient presents with  . Hematuria    HPI: Antonio Russell is a 59 y.o. male who presents for an ER follow up.   -Seen [facility] ED on 10/26/2019 for right upper quadrant pain radiating to the right flank  - Abdomina ultrasound was performed which showed a 12 mm right proximal ureteral calculus and hydronephrosis -Pain is improved and intermittent  -No longer on Plavix; stopped  his aspirin on Monday -He has untreated hyperparathyroidism and hypercalcemia but denies recurrent calculi -Recent evaluation for hematuria; calculus not seen on CT however was visualized today in retrospect -Cystoscopy unremarkable -KUB 7/26 showed a large amount of stool and bowel gas and the calculus not definitely seen   PMH: Past Medical History:  Diagnosis Date  . Allergic rhinitis   . Allergy   . Anxiety   . Arthritis   . Back pain   . Chest pain 2012   cardiac cath - nonobstructive, nl LV fxn, rec aggressive med management  . GERD (gastroesophageal reflux disease)   . Headache(784.0)   . HLD (hyperlipidemia)   . Hyperparathyroidism ([name])   . Hypertension   . Impotence of organic origin   . Other testicular hypofunction   . Personal history of urinary calculi   . Sleep apnea   . Tobacco use disorder   . Unspecified hearing loss     Surgical History: Past Surgical History:  Procedure Laterality Date  . COLON SURGERY    . COLONOSCOPY    . ENDOVASCULAR REPAIR/STENT GRAFT N/A 08/12/2019   Procedure: ENDOVASCULAR REPAIR/STENT GRAFT;  Surgeon: Algernon Huxley, MD;  Location: [facility];  Service: Cardiovascular;  Laterality: N/A;  . HEMORRHOID SURGERY    . JOINT REPLACEMENT    . KNEE SURGERY Right 2013  . NASAL SEPTUM SURGERY      Home Medications:  Allergies as of 10/28/2019      Reactions   Sertraline Hcl Other (See  Comments)   Tremors   Amlodipine Other (See Comments)   Sexual side effects   Crestor [rosuvastatin Calcium] Other (See Comments)   Myalgia   Lipitor [atorvastatin Calcium] Other (See Comments)   Myalgia   Zocor [simvastatin - High Dose] Other (See Comments)   Myalgia      Medication List       Accurate as of October 28, 2019  8:53 AM. If you have any questions, ask your nurse or doctor.        aspirin EC 81 MG tablet Take 1 tablet (81 mg total) by mouth daily.   cetirizine 10 MG tablet Commonly known as: ZYRTEC Take 10 mg by mouth daily.   Chantix Starting Month Pak 0.5 MG X 11 & 1 MG X 42 tablet Generic drug: varenicline TAKE AS DIRECTED ON PACKAGE   Chantix 1 MG tablet Generic drug: varenicline TAKE 1 TABLET (1 MG TOTAL) BY MOUTH 2 (TWO) TIMES DAILY. START AFTER STARTER PACK   clopidogrel 75 MG tablet Commonly known as: PLAVIX Take 1 tablet (75 mg total) by mouth daily at 6 (six) AM.   diazepam 10 MG tablet Commonly known as: VALIUM 1 tab po 30 min prior to procedure   fexofenadine 60 MG tablet Commonly known as: ALLEGRA Take 60 mg by mouth daily.   fexofenadine 60 MG tablet Commonly known as: ALLEGRA Take 60  mg by mouth 2 (two) times daily.   fluticasone 50 MCG/ACT nasal spray Commonly known as: FLONASE SPRAY 2 SPRAYS INTO EACH NOSTRIL EVERY DAY   HYDROcodone-acetaminophen 5-325 MG tablet Commonly known as: NORCO/VICODIN 1 tablet by mouth 30 minutes prior to procedure   meloxicam 15 MG tablet Commonly known as: MOBIC TAKE 1 TABLET (15 MG TOTAL) BY MOUTH DAILY AS NEEDED FOR PAIN (JOINT PAIN). WITH FOOD   metoprolol succinate 50 MG 24 hr tablet Commonly known as: TOPROL-XL Take 1 tablet (50 mg total) by mouth daily. Take with or immediately following a meal.   ondansetron 4 MG disintegrating tablet Commonly known as: Zofran ODT Take 1 tablet (4 mg total) by mouth every 8 (eight) hours as needed for nausea or vomiting.   oxyCODONE-acetaminophen  5-325 MG tablet Commonly known as: Percocet Take 1 tablet by mouth every 6 (six) hours as needed for up to 3 days for severe pain.   Praluent 75 MG/ML Soaj Generic drug: Alirocumab Inject 1 pen into the skin every 14 (fourteen) days.   Vitamin D (Ergocalciferol) 1.25 MG (50000 UNIT) Caps capsule Commonly known as: DRISDOL Take 1 capsule (50,000 Units total) by mouth every 7 (seven) days. For 12 weeks   Vitamin D 125 MCG (5000 UT) Caps Take 1 capsule by mouth daily.       Allergies:  Allergies  Allergen Reactions  . Sertraline Hcl Other (See Comments)    Tremors  . Amlodipine Other (See Comments)    Sexual side effects  . Crestor [Rosuvastatin Calcium] Other (See Comments)    Myalgia  . Lipitor [Atorvastatin Calcium] Other (See Comments)    Myalgia  . Zocor [Simvastatin - High Dose] Other (See Comments)    Myalgia     Family History: Family History  Problem Relation Age of Onset  . Hypertension Mother   . Hyperlipidemia Mother   . Diabetes Mother   . Hypertension Father   . Hyperlipidemia Father   . Cancer Father        throat  . Hypertension Sister   . Lung cancer Other        GF  . Heart disease Other        GM  . Esophageal cancer Paternal Uncle   . Colon cancer Neg Hx   . Stomach cancer Neg Hx   . Rectal cancer Neg Hx     Social History:  reports that he has been smoking cigarettes. He has a 8.75 pack-year smoking history. He has quit using smokeless tobacco. He reports current alcohol use of about 18.0 standard drinks of alcohol per week. He reports that he does not use drugs.   Physical Exam: BP (!) 150/89   Pulse 85   Ht 6' (1.829 m)   Wt 195 lb (88.5 kg)   BMI 26.45 kg/m   Constitutional:  Alert and oriented, No acute distress. HEENT: [place] AT, moist mucus membranes.  Trachea midline, no masses. Cardiovascular: No clubbing, cyanosis, or edema.  RRR Respiratory: Normal respiratory effort, no increased work of breathing.  Clear Skin: No rashes,  bruises or suspicious lesions. Neurologic: Grossly intact, no focal deficits, moving all 4 extremities. Psychiatric: Normal mood and affect.  Imaging: KUB performed today is reviewed and the calculus is visible  Assessment & Plan:    1.  Right proximal ureteral calculus -Recent abdominal ultrasound with hydronephrosis/proximal hydroureter and calculus secondary to 12 mm proximal ureteral calculus -Based on size unlikely to pass. We discussed various treatment options of  shockwave lithotripsy (SWL) and ureteroscopy and laser lithotripsy with stent placement  -SWL has a lower stone free rate in a single procedure, but also a lower complication rate compared to ureteroscopy and avoids a stent and associated stent related symptoms. Possible complications include renal hematoma, steinstrasse, and need for additional treatment.  -Ureteroscopy with laser lithotripsy and stent placement has a higher stone free rate than SWL in a single procedure, however increased complication rate including possible infection, ureteral injury, bleeding, and stent related morbidity. Common stent related symptoms include dysuria, urgency/frequency, and flank pain.  -After an extensive discussion of the risks and benefits of the above treatment options, the patient would like to proceed with shockwave lithotripsy.  Patient was informed that Dr. Diamantina Providence is the treating physician this week.   [facility] [address], [place], [place] [address] [phone]  I, Joneen Boers Peace, am acting as a Education administrator for Dr. Nicki Reaper C. Tamiki Kuba.  I have reviewed the above documentation for accuracy and completeness, and I agree with the above.   Abbie Sons, MD

## 2019-10-29 ENCOUNTER — Encounter
Admission: RE | Disposition: A | Discharge status: Home / Self Care | Payer: Self-pay | Source: Home / Self Care | Attending: Urology

## 2019-10-29 ENCOUNTER — Encounter: Payer: Self-pay | Admitting: Urology

## 2019-10-29 ENCOUNTER — Ambulatory Visit
Admission: RE | Admit: 2019-10-29 | Discharge: 2019-10-29 | Disposition: A | Discharge status: Home / Self Care | Payer: 59 | Attending: Urology | Admitting: Urology

## 2019-10-29 DIAGNOSIS — N132 Hydronephrosis with renal and ureteral calculous obstruction: Secondary | ICD-10-CM | POA: Insufficient documentation

## 2019-10-29 DIAGNOSIS — M1990 Unspecified osteoarthritis, unspecified site: Secondary | ICD-10-CM | POA: Diagnosis not present

## 2019-10-29 DIAGNOSIS — Z7902 Long term (current) use of antithrombotics/antiplatelets: Secondary | ICD-10-CM | POA: Insufficient documentation

## 2019-10-29 DIAGNOSIS — Z79899 Other long term (current) drug therapy: Secondary | ICD-10-CM | POA: Diagnosis not present

## 2019-10-29 DIAGNOSIS — E213 Hyperparathyroidism, unspecified: Secondary | ICD-10-CM | POA: Insufficient documentation

## 2019-10-29 DIAGNOSIS — E785 Hyperlipidemia, unspecified: Secondary | ICD-10-CM | POA: Insufficient documentation

## 2019-10-29 DIAGNOSIS — F419 Anxiety disorder, unspecified: Secondary | ICD-10-CM | POA: Diagnosis not present

## 2019-10-29 DIAGNOSIS — F17210 Nicotine dependence, cigarettes, uncomplicated: Secondary | ICD-10-CM | POA: Diagnosis not present

## 2019-10-29 DIAGNOSIS — Z7982 Long term (current) use of aspirin: Secondary | ICD-10-CM | POA: Diagnosis not present

## 2019-10-29 DIAGNOSIS — K219 Gastro-esophageal reflux disease without esophagitis: Secondary | ICD-10-CM | POA: Insufficient documentation

## 2019-10-29 DIAGNOSIS — I10 Essential (primary) hypertension: Secondary | ICD-10-CM | POA: Diagnosis not present

## 2019-10-29 DIAGNOSIS — N200 Calculus of kidney: Secondary | ICD-10-CM

## 2019-10-29 HISTORY — PX: EXTRACORPOREAL SHOCK WAVE LITHOTRIPSY: SHX1557

## 2019-10-29 SURGERY — LITHOTRIPSY, ESWL
Anesthesia: Moderate Sedation | Laterality: Right

## 2019-10-29 MED ORDER — DIPHENHYDRAMINE HCL 25 MG PO CAPS
ORAL_CAPSULE | ORAL | Status: AC
  Administered 2019-10-29: 25 mg via ORAL
  Filled 2019-10-29: qty 1

## 2019-10-29 MED ORDER — CIPROFLOXACIN HCL 500 MG PO TABS
ORAL_TABLET | ORAL | Status: AC
  Administered 2019-10-29: 500 mg via ORAL
  Filled 2019-10-29: qty 1

## 2019-10-29 MED ORDER — SODIUM CHLORIDE 0.9 % IV SOLN: INTRAVENOUS | Status: DC

## 2019-10-29 MED ORDER — DIAZEPAM 5 MG PO TABS
ORAL_TABLET | ORAL | Status: AC
  Administered 2019-10-29: 10 mg via ORAL
  Filled 2019-10-29: qty 2

## 2019-10-29 MED ORDER — ONDANSETRON HCL 4 MG/2ML IJ SOLN: 4.0000 mg | Freq: Once | INTRAMUSCULAR | Status: AC | PRN

## 2019-10-29 MED ORDER — OXYCODONE-ACETAMINOPHEN 5-325 MG PO TABS: 1.0000 | ORAL_TABLET | ORAL | 0 refills | Status: AC | PRN

## 2019-10-29 MED ORDER — ONDANSETRON HCL 4 MG/2ML IJ SOLN
INTRAMUSCULAR | Status: AC
  Administered 2019-10-29: 4 mg via INTRAVENOUS
  Filled 2019-10-29: qty 2

## 2019-10-29 MED ORDER — DIAZEPAM 5 MG PO TABS: 10.0000 mg | ORAL_TABLET | ORAL | Status: AC

## 2019-10-29 MED ORDER — TAMSULOSIN HCL 0.4 MG PO CAPS
0.4000 mg | ORAL_CAPSULE | Freq: Every day | ORAL | 0 refills | Status: DC
Start: 2019-10-29 — End: 2019-12-02

## 2019-10-29 MED ORDER — DIPHENHYDRAMINE HCL 25 MG PO CAPS: 25.0000 mg | ORAL_CAPSULE | ORAL | Status: AC

## 2019-10-29 MED ORDER — CIPROFLOXACIN HCL 500 MG PO TABS: 500.0000 mg | ORAL_TABLET | ORAL | Status: AC

## 2019-10-29 NOTE — Progress Notes (Addendum)
   10/29/19 0750  Clinical Encounter Type  Visited With Family  Visit Type Initial  Referral From Chaplain  Consult/Referral To Chaplain  While rounding [facility] waiting area, chaplain briefly visited with patient's family. Told them just checking to see how they are doing as they wait. Family member said they are fine.

## 2019-10-29 NOTE — Interval H&P Note (Signed)
UROLOGY H&P UPDATE  Agree with prior H&P dated 7/28 by Dr. Bernardo Heater. Renal US and KUB suggests ~1cm right proximal ureteral stone, recent CT shows stone with 700HU density.  Cardiac: RRR Lungs: CTA bilaterally  Laterality: right Procedure: shockwave lithotripsy  Informed consent obtained, we specifically discussed the risks of bleeding, infection, post-operative pain, need for additional procedures, obstructive fragments, steinstrasse.  Billey Co, MD 10/29/2019

## 2019-10-29 NOTE — Discharge Instructions (Signed)
    AMBULATORY SURGERY  DISCHARGE INSTRUCTIONS   1) The drugs that you were given will stay in your system until tomorrow so for the next 24 hours you should not:  A) Drive an automobile B) Make any legal decisions C) Drink any alcoholic beverage   2) You may resume regular meals tomorrow.  Today it is better to start with liquids and gradually work up to solid foods.  You may eat anything you prefer, but it is better to start with liquids, then soup and crackers, and gradually work up to solid foods.   3) Please notify your doctor immediately if you have any unusual bleeding, trouble breathing, redness and pain at the surgery site, drainage, fever, or pain not relieved by medication.    4) Additional Instructions:        Please contact your physician with any problems or Same Day Surgery at [phone], Monday through Friday 6 am to 4 pm, or [place] at [facility] number at [phone].Follow [facility] stone instruction sheet.

## 2019-10-29 NOTE — Brief Op Note (Signed)
10/29/2019  10:00 AM  PATIENT:  Antonio Russell  59 y.o. male  PRE-OPERATIVE DIAGNOSIS:  1cm Right UPJ stone  POST-OPERATIVE DIAGNOSIS:  Same  PROCEDURE:  Procedure(s): EXTRACORPOREAL SHOCK WAVE LITHOTRIPSY (ESWL) (Right)  SURGEON:  Surgeon(s) and Role:    * Billey Co, MD - Primary  ANESTHESIA: Conscious Sedation  EBL:  None  Drains: None  Specimen: None  Findings:  1. Stone appeared to have excellent fragmentation on xray  DISPO: Flomax, pain meds PRN, RTC 2 weeks KUB  Nickolas Madrid, MD 10/29/2019

## 2019-10-30 ENCOUNTER — Telehealth: Payer: Self-pay | Admitting: *Deleted

## 2019-10-30 NOTE — Telephone Encounter (Signed)
Received fax saying pt's Chantix that was sent in back on 07/29/19 is being recalled and requested an alt med be sent in.  [facility]

## 2019-10-30 NOTE — Telephone Encounter (Signed)
Pt said he isn't on chantix anymore and doesn't need an alt med sent in, I left pharmacy a VM letting them know  FYI to PCP

## 2019-10-30 NOTE — Telephone Encounter (Signed)
There is no direct substitution for it.  Wellbutrin can work similarly however if he wants to try it.

## 2019-11-06 ENCOUNTER — Ambulatory Visit
Admission: RE | Admit: 2019-11-06 | Discharge: 2019-11-06 | Disposition: A | Discharge status: Home / Self Care | Payer: 59 | Source: Ambulatory Visit | Attending: Surgery | Admitting: Surgery

## 2019-11-06 DIAGNOSIS — E210 Primary hyperparathyroidism: Secondary | ICD-10-CM

## 2019-11-06 MED ORDER — IOPAMIDOL (ISOVUE-300) INJECTION 61%
75.0000 mL | Freq: Once | INTRAVENOUS | Status: AC | PRN
  Administered 2019-11-06: 75 mL via INTRAVENOUS

## 2019-11-16 ENCOUNTER — Telehealth (INDEPENDENT_AMBULATORY_CARE_PROVIDER_SITE_OTHER): Payer: Self-pay | Admitting: Vascular Surgery

## 2019-11-19 NOTE — Progress Notes (Addendum)
11/20/2019 9:30 AM   Antonio Russell [birth year] [id number]  Referring provider: Abner Greenspan, MD [address] [place],  [place] [address]  Chief Complaint  Patient presents with  . Nephrolithiasis    HPI: Antonio Russell is a 59 y.o. who is status post ESWL who presents today for follow up.  Underwent ESWL on 10/29/2019 for a right 1 cm UPJ stone with Dr. Diamantina Providence.  His postprocedural course was as expected and uneventful.   He has not passed fragments.   KUB right UPJ stone no longer visible  UA negative for micro heme  He continues to have right upper quadrant pain that is unchanged from prior to the ESWL.  He is also having acholic stool.  Patient denies any modifying or aggravating factors.  Patient denies any gross hematuria, dysuria or suprapubic/flank pain.  Patient denies any fevers, chills, nausea or vomiting.   He also has hyperparathyroidism and has seen a general surgeon who performed a CT scan and noted carotid artery stenosis.  He is currently waiting for an appointment with his vascular surgeon to address this issue as well.  He is also had a consultation with endocrinology regarding his hyperparathyroid issue.  PMH: Past Medical History:  Diagnosis Date  . Allergic rhinitis   . Allergy   . Anxiety   . Arthritis   . Back pain   . Chest pain 2012   cardiac cath - nonobstructive, nl LV fxn, rec aggressive med management  . GERD (gastroesophageal reflux disease)   . Headache(784.0)   . HLD (hyperlipidemia)   . Hyperparathyroidism ([place])   . Hypertension   . Impotence of organic origin   . Other testicular hypofunction   . Personal history of urinary calculi   . Sleep apnea   . Tobacco use disorder   . Unspecified hearing loss     Surgical History: Past Surgical History:  Procedure Laterality Date  . COLON SURGERY    . COLONOSCOPY    . ENDOVASCULAR REPAIR/STENT GRAFT N/A 08/12/2019   Procedure: ENDOVASCULAR REPAIR/STENT GRAFT;  Surgeon: Algernon Huxley, MD;  Location: [facility];  Service: Cardiovascular;  Laterality: N/A;  . EXTRACORPOREAL SHOCK WAVE LITHOTRIPSY Right 10/29/2019   Procedure: EXTRACORPOREAL SHOCK WAVE LITHOTRIPSY (ESWL);  Surgeon: Billey Co, MD;  Location: [facility];  Service: Urology;  Laterality: Right;  . HEMORRHOID SURGERY    . KNEE SURGERY Right 2013  . NASAL SEPTUM SURGERY      Home Medications:  Current Outpatient Medications on File Prior to Visit  Medication Sig Dispense Refill  . Alirocumab (PRALUENT) 75 MG/ML SOAJ Inject 1 pen into the skin every 14 (fourteen) days. 2 pen 5  . aspirin EC 81 MG tablet Take 1 tablet (81 mg total) by mouth daily. 90 tablet 3  . CHANTIX 1 MG tablet TAKE 1 TABLET (1 MG TOTAL) BY MOUTH 2 (TWO) TIMES DAILY. START AFTER STARTER PACK 180 tablet 0  . Cholecalciferol (VITAMIN D) 125 MCG (5000 UT) CAPS Take 1 capsule by mouth daily.     . clopidogrel (PLAVIX) 75 MG tablet Take 1 tablet (75 mg total) by mouth daily at 6 (six) AM. 90 tablet 1  . diazepam (VALIUM) 10 MG tablet 1 tab po 30 min prior to procedure 1 tablet 0  . fexofenadine (ALLEGRA) 60 MG tablet Take 60 mg by mouth daily.     . fluticasone (FLONASE) 50 MCG/ACT nasal spray SPRAY 2 SPRAYS INTO EACH NOSTRIL EVERY DAY  16 g 5  . meloxicam (MOBIC) 15 MG tablet TAKE 1 TABLET (15 MG TOTAL) BY MOUTH DAILY AS NEEDED FOR PAIN (JOINT PAIN). WITH FOOD 30 tablet 3  . metoprolol succinate (TOPROL-XL) 50 MG 24 hr tablet Take 1 tablet (50 mg total) by mouth daily. Take with or immediately following a meal. 30 tablet 6  . ondansetron (ZOFRAN ODT) 4 MG disintegrating tablet Take 1 tablet (4 mg total) by mouth every 8 (eight) hours as needed for nausea or vomiting. 20 tablet 0  . tamsulosin (FLOMAX) 0.4 MG CAPS capsule Take 1 capsule (0.4 mg total) by mouth daily after supper. 14 capsule 0   No current facility-administered medications on file prior to visit.    Allergies:  Allergies  Allergen Reactions  . Sertraline Hcl Other  (See Comments)    Tremors  . Amlodipine Other (See Comments)    Sexual side effects  . Crestor [Rosuvastatin Calcium] Other (See Comments)    Myalgia  . Lipitor [Atorvastatin Calcium] Other (See Comments)    Myalgia  . Zocor [Simvastatin - High Dose] Other (See Comments)    Myalgia     Family History: Family History  Problem Relation Age of Onset  . Hypertension Mother   . Hyperlipidemia Mother   . Diabetes Mother   . Hypertension Father   . Hyperlipidemia Father   . Cancer Father        throat  . Hypertension Sister   . Lung cancer Other        GF  . Heart disease Other        GM  . Esophageal cancer Paternal Uncle   . Colon cancer Neg Hx   . Stomach cancer Neg Hx   . Rectal cancer Neg Hx     Social History:  reports that he quit smoking about 6 weeks ago. His smoking use included cigarettes. He has a 8.75 pack-year smoking history. He has quit using smokeless tobacco. He reports current alcohol use of about 18.0 standard drinks of alcohol per week. He reports that he does not use drugs.  ROS: Pertinent ROS in HPI  Physical Exam: BP (!) 148/89   Pulse 66   Ht 6' (1.829 m)   Wt 195 lb (88.5 kg)   BMI 26.45 kg/m   Constitutional:  Well nourished. Alert and oriented, No acute distress. HEENT: [place] AT, mask in place.   Trachea midline. Cardiovascular: No clubbing, cyanosis, or edema. Respiratory: Normal respiratory effort, no increased work of breathing. Neurologic: Grossly intact, no focal deficits, moving all 4 extremities. Psychiatric: Normal mood and affect.  Laboratory Data: Lab Results  Component Value Date   WBC 9.4 10/26/2019   HGB 15.0 10/26/2019   HCT 43.4 10/26/2019   MCV 97.1 10/26/2019   PLT 243 10/26/2019    Lab Results  Component Value Date   CREATININE 1.09 10/26/2019       Component Value Date/Time   CHOL 185 10/28/2019 0940   HDL 56 10/28/2019 0940   CHOLHDL 3.3 10/28/2019 0940   VLDL 45 (H) 10/28/2019 0940   LDLCALC 84 10/28/2019  0940    Urinalysis Component     Latest Ref Rng & Units 11/20/2019  Specific Gravity, UA     1.005 - 1.030 1.020  pH, UA     5.0 - 7.5 6.0  Color, UA     Yellow Yellow  Appearance Ur     Clear Clear  Leukocytes,UA     Negative Negative  Protein,UA  Negative/Trace Negative  Glucose, UA     Negative Negative  Ketones, UA     Negative Negative  RBC, UA     Negative Negative  Bilirubin, UA     Negative Negative  Urobilinogen, Ur     0.2 - 1.0 mg/dL 0.2  Nitrite, UA     Negative Negative  Microscopic Examination      See below:   Component     Latest Ref Rng & Units 11/20/2019  WBC, UA     0 - 5 /hpf 0-5  RBC     0 - 2 /hpf None seen  Epithelial Cells (non renal)     0 - 10 /hpf 0-10  Bacteria, UA     None seen/Few None seen    I have reviewed the labs.   Pertinent Imaging: CLINICAL DATA:  Right kidney stone.  Recent lithotripsy.  EXAM: ABDOMEN - 1 VIEW  COMPARISON:  Abdominal x-ray dated October 28, 2019.  FINDINGS: The bowel gas pattern is normal. No radio-opaque calculi or other significant radiographic abnormality are seen. Aortoiliac stent graft again noted. No acute osseous abnormality.  IMPRESSION: 1. Right ureteral calculus no longer visualized.   Electronically Signed   By: Titus Dubin M.D.   On: 11/20/2019 15:39 I have independently reviewed the films.  See HPI  Assessment & Plan:   1. Right UPJ stone Not visible on KUB UA negative for micro heme Stones likely passed  2. Gross hematuria Recent hematuria work up NED  3. Acholic stool Referral placed to his gastroenterologist Norberto Sorenson T. Fuller Plan, MD  4. Hyperparathyroidism Patient realizes that this puts him at risk for more stone formation He has had a consultation with endocrinologist Benjiman Core, MD and will follow up with her once his parathyroidectomy is completed He has had a consultation with general surgeon Armandina Gemma, MD, but they are waiting for clearance  regarding his carotid artery stenosis to proceed  5. Carotid artery stenosis He is waiting on review of his neck CT from his vascular surgeon Dr. Lucky Cowboy in regards to his carotid arteries stenosis   Return in about 6 months (around 05/22/2020) for KUB and office visit .  These notes generated with voice recognition software. I apologize for typographical errors.  Zara Council, PA-C  [facility] [address]  [place], [place] [address] [phone]  I spent 15 minutes on the day of the encounter to include pre-visit record review, face-to-face time with the patient, and post-visit ordering of tests.

## 2019-11-20 ENCOUNTER — Other Ambulatory Visit: Payer: Self-pay

## 2019-11-20 ENCOUNTER — Ambulatory Visit (INDEPENDENT_AMBULATORY_CARE_PROVIDER_SITE_OTHER): Payer: 59 | Admitting: Urology

## 2019-11-20 ENCOUNTER — Ambulatory Visit
Admission: RE | Admit: 2019-11-20 | Discharge: 2019-11-20 | Disposition: A | Discharge status: Home / Self Care | Payer: 59 | Attending: Urology | Admitting: Urology

## 2019-11-20 ENCOUNTER — Ambulatory Visit
Admission: RE | Admit: 2019-11-20 | Discharge: 2019-11-20 | Disposition: A | Discharge status: Home / Self Care | Payer: 59 | Source: Ambulatory Visit | Attending: Urology | Admitting: Urology

## 2019-11-20 ENCOUNTER — Encounter: Payer: Self-pay | Admitting: Urology

## 2019-11-20 VITALS — BP 148/89 | HR 66 | Ht 72.0 in | Wt 195.0 lb

## 2019-11-20 DIAGNOSIS — R310 Gross hematuria: Secondary | ICD-10-CM

## 2019-11-20 DIAGNOSIS — N200 Calculus of kidney: Secondary | ICD-10-CM

## 2019-11-20 DIAGNOSIS — N132 Hydronephrosis with renal and ureteral calculous obstruction: Secondary | ICD-10-CM

## 2019-11-20 DIAGNOSIS — R1011 Right upper quadrant pain: Secondary | ICD-10-CM

## 2019-11-20 DIAGNOSIS — R195 Other fecal abnormalities: Secondary | ICD-10-CM | POA: Diagnosis not present

## 2019-11-23 LAB — URINALYSIS, COMPLETE
Bilirubin, UA: NEGATIVE
Glucose, UA: NEGATIVE
Ketones, UA: NEGATIVE
Leukocytes,UA: NEGATIVE
Nitrite, UA: NEGATIVE
Protein,UA: NEGATIVE
RBC, UA: NEGATIVE
Specific Gravity, UA: 1.02 (ref 1.005–1.030)
Urobilinogen, Ur: 0.2 mg/dL (ref 0.2–1.0)
pH, UA: 6 (ref 5.0–7.5)

## 2019-11-23 LAB — MICROSCOPIC EXAMINATION
Bacteria, UA: NONE SEEN
RBC, Urine: NONE SEEN /hpf (ref 0–2)

## 2019-12-02 ENCOUNTER — Telehealth: Payer: Self-pay | Admitting: *Deleted

## 2019-12-02 ENCOUNTER — Encounter: Payer: Self-pay | Admitting: Physician Assistant

## 2019-12-02 ENCOUNTER — Ambulatory Visit (INDEPENDENT_AMBULATORY_CARE_PROVIDER_SITE_OTHER): Payer: 59 | Admitting: Physician Assistant

## 2019-12-02 VITALS — BP 160/90 | HR 76 | Ht 71.26 in | Wt 198.5 lb

## 2019-12-02 DIAGNOSIS — Z7901 Long term (current) use of anticoagulants: Secondary | ICD-10-CM | POA: Diagnosis not present

## 2019-12-02 DIAGNOSIS — G8929 Other chronic pain: Secondary | ICD-10-CM

## 2019-12-02 DIAGNOSIS — Z86010 Personal history of colonic polyps: Secondary | ICD-10-CM | POA: Diagnosis not present

## 2019-12-02 DIAGNOSIS — R1011 Right upper quadrant pain: Secondary | ICD-10-CM | POA: Diagnosis not present

## 2019-12-02 DIAGNOSIS — R142 Eructation: Secondary | ICD-10-CM | POA: Diagnosis not present

## 2019-12-02 MED ORDER — SUTAB 1479-225-188 MG PO TABS: 1.0000 | ORAL_TABLET | Freq: Once | ORAL | 0 refills | Status: AC

## 2019-12-02 NOTE — Progress Notes (Signed)
Chief Complaint: Right upper quadrant pain and a change in bowel habits  HPI:    Antonio Russell is a 59 year old male with a past medical history as listed below, known to Dr. Fuller Plan, who was referred to me by Nori Riis, PA-C for a complaint of change in bowel habits and right upper quadrant pain.    04/28/2019 abdominal ultrasound with mild right hydronephrosis with a 1.2 cm stone in the upper right ureter, normal appearance of the gallbladder, no gallstones, hepatic steatosis.    02/15/2017 colonoscopy for personal history of adenomatous polyps with a 12 mm polyp in the ascending colon, 3 6-7 mm polyps in the sigmoid and ascending colon, internal hemorrhoids and otherwise normal.  Polyps were a mixture of sessile serrated, tubular adenomas and hyperplastic polyps.  Repeat was recommended in 3 years.    Today, patient presents to clinic and tells me for the past 5 to 6 years he has had a right upper quadrant pain which seems to come and go.  When this was initially evaluated his white count was elevated but turns out "it is always elevated".  He tells me that since then it has come and gone but lately it is there more often, it is not necessarily altered by eating and nothing typically seems to bring it on.  When it is there it is pretty constantly a 7/10.  Over the past 3 to 4 days it has been there pretty frequently and seems to radiate around to his back which is new.  Tells me that he did have lithotripsy on the kidney stone found in the ER and they told him it is gone but he still continues with this pain.  Occasionally he will have a very "light-colored" stool.  Did try taking meloxicam over the weekend when this pain initially started and it helped but then did not help at all.  Describes a vague history of reflux symptoms for which she was on Nexium for 6 months years ago but has not had any trouble with these since then.    Denies fever, chills, weight loss, nausea, vomiting or symptoms that  awaken him from sleep.  Past Medical History:  Diagnosis Date  . Allergic rhinitis   . Allergy   . Anxiety   . Arthritis   . Back pain   . Chest pain 2012   cardiac cath - nonobstructive, nl LV fxn, rec aggressive med management  . GERD (gastroesophageal reflux disease)   . Headache(784.0)   . HLD (hyperlipidemia)   . Hyperparathyroidism ([place])   . Hypertension   . Impotence of organic origin   . Other testicular hypofunction   . Personal history of urinary calculi   . Sleep apnea   . Tobacco use disorder   . Unspecified hearing loss     Past Surgical History:  Procedure Laterality Date  . COLON SURGERY    . COLONOSCOPY    . ENDOVASCULAR REPAIR/STENT GRAFT N/A 08/12/2019   Procedure: ENDOVASCULAR REPAIR/STENT GRAFT;  Surgeon: Algernon Huxley, MD;  Location: [facility];  Service: Cardiovascular;  Laterality: N/A;  . EXTRACORPOREAL SHOCK WAVE LITHOTRIPSY Right 10/29/2019   Procedure: EXTRACORPOREAL SHOCK WAVE LITHOTRIPSY (ESWL);  Surgeon: Billey Co, MD;  Location: [facility];  Service: Urology;  Laterality: Right;  . HEMORRHOID SURGERY    . KNEE SURGERY Right 2013  . NASAL SEPTUM SURGERY      Current Outpatient Medications  Medication Sig Dispense Refill  . Alirocumab ([place])  75 MG/ML SOAJ Inject 1 pen into the skin every 14 (fourteen) days. 2 pen 5  . aspirin EC 81 MG tablet Take 1 tablet (81 mg total) by mouth daily. 90 tablet 3  . Cholecalciferol (VITAMIN D) 125 MCG (5000 UT) CAPS Take 1 capsule by mouth daily.     . clopidogrel (PLAVIX) 75 MG tablet Take 1 tablet (75 mg total) by mouth daily at 6 (six) AM. 90 tablet 1  . fexofenadine (ALLEGRA) 60 MG tablet Take 60 mg by mouth daily.     . fluticasone (FLONASE) 50 MCG/ACT nasal spray SPRAY 2 SPRAYS INTO EACH NOSTRIL EVERY DAY 16 g 5  . meloxicam (MOBIC) 15 MG tablet TAKE 1 TABLET (15 MG TOTAL) BY MOUTH DAILY AS NEEDED FOR PAIN (JOINT PAIN). WITH FOOD 30 tablet 3  . metoprolol succinate (TOPROL-XL) 50 MG 24  hr tablet Take 1 tablet (50 mg total) by mouth daily. Take with or immediately following a meal. 30 tablet 6  . ondansetron (ZOFRAN ODT) 4 MG disintegrating tablet Take 1 tablet (4 mg total) by mouth every 8 (eight) hours as needed for nausea or vomiting. 20 tablet 0   No current facility-administered medications for this visit.    Allergies as of 12/02/2019 - Review Complete 12/02/2019  Allergen Reaction Noted  . Sertraline hcl Other (See Comments) 06/22/2008  . Amlodipine Other (See Comments) 12/01/2018  . Crestor [rosuvastatin calcium] Other (See Comments) 08/10/2010  . Lipitor [atorvastatin calcium] Other (See Comments) 08/10/2010  . Zocor [simvastatin - high dose] Other (See Comments) 01/08/2011    Family History  Problem Relation Age of Onset  . Hypertension Mother   . Hyperlipidemia Mother   . Diabetes Mother   . Hypertension Father   . Hyperlipidemia Father   . Cancer Father        throat  . Hypertension Sister   . Lung cancer Other        GF  . Heart disease Other        GM  . Esophageal cancer Paternal Uncle   . Colon cancer Neg Hx   . Stomach cancer Neg Hx   . Rectal cancer Neg Hx     Social History   Socioeconomic History  . Marital status: Married    Spouse name: Not on file  . Number of children: Not on file  . Years of education: Not on file  . Highest education level: Not on file  Occupational History  . Not on file  Tobacco Use  . Smoking status: Former Smoker    Packs/day: 0.25    Years: 35.00    Pack years: 8.75    Types: Cigarettes    Quit date: 10/08/2019    Years since quitting: 0.1  . Smokeless tobacco: Former Network engineer  . Vaping Use: Never used  Substance and Sexual Activity  . Alcohol use: Yes    Alcohol/week: 18.0 standard drinks    Types: 18 Cans of beer per week  . Drug use: No  . Sexual activity: Not on file  Other Topics Concern  . Not on file  Social History Narrative   Coaches basketball   Social Determinants of  Health   Financial Resource Strain:   . Difficulty of Paying Living Expenses: Not on file  Food Insecurity:   . Worried About Charity fundraiser in the Last Year: Not on file  . Ran Out of Food in the Last Year: Not on file  Transportation Needs:   .  Lack of Transportation (Medical): Not on file  . Lack of Transportation (Non-Medical): Not on file  Physical Activity:   . Days of Exercise per Week: Not on file  . Minutes of Exercise per Session: Not on file  Stress:   . Feeling of Stress : Not on file  Social Connections:   . Frequency of Communication with Friends and Family: Not on file  . Frequency of Social Gatherings with Friends and Family: Not on file  . Attends Religious Services: Not on file  . Active Member of Clubs or Organizations: Not on file  . Attends Archivist Meetings: Not on file  . Marital Status: Not on file  Intimate Partner Violence:   . Fear of Current or Ex-Partner: Not on file  . Emotionally Abused: Not on file  . Physically Abused: Not on file  . Sexually Abused: Not on file    Review of Systems:    Constitutional: No weight loss, fever or chills Cardiovascular: No chest pain Respiratory: No SOB  Gastrointestinal: See HPI and otherwise negative   Physical Exam:  Vital signs: BP (!) 160/90 (BP Location: Left Arm, Patient Position: Sitting, Cuff Size: Normal)   Pulse 76   Ht 5' 11.26" (1.81 m) Comment: height measured without shoes  Wt 198 lb 8 oz (90 kg)   BMI 27.48 kg/m   Constitutional:   Pleasant Caucasian male appears to be in NAD, Well developed, Well nourished, alert and cooperative Respiratory: Respirations even and unlabored. Lungs clear to auscultation bilaterally.   No wheezes, crackles, or rhonchi.  Cardiovascular: Normal S1, S2. No MRG. Regular rate and rhythm. No peripheral edema, cyanosis or pallor.  Gastrointestinal:  Soft, nondistended, moderate RUQ ttp. No rebound or guarding. Normal bowel sounds. No appreciable  masses or hepatomegaly. Rectal:  Not performed.  Psychiatric: Demonstrates good judgement and reason without abnormal affect or behaviors.  RELEVANT LABS AND IMAGING: CBC    Component Value Date/Time   WBC 9.4 10/26/2019 1155   RBC 4.47 10/26/2019 1155   HGB 15.0 10/26/2019 1155   HGB 16.3 12/29/2014 1421   HCT 43.4 10/26/2019 1155   HCT 48.7 12/29/2014 1421   PLT 243 10/26/2019 1155   PLT 256 12/29/2014 1421   MCV 97.1 10/26/2019 1155   MCV 96.3 12/29/2014 1421   MCH 33.6 10/26/2019 1155   MCHC 34.6 10/26/2019 1155   RDW 13.6 10/26/2019 1155   RDW 13.2 12/29/2014 1421   LYMPHSABS 3.9 08/10/2019 0923   LYMPHSABS 3.8 (H) 12/29/2014 1421   MONOABS 1.1 (H) 08/10/2019 0923   MONOABS 1.2 (H) 12/29/2014 1421   EOSABS 0.5 08/10/2019 0923   EOSABS 0.5 12/29/2014 1421   BASOSABS 0.1 08/10/2019 0923   BASOSABS 0.0 12/29/2014 1421    CMP     Component Value Date/Time   NA 137 10/26/2019 1155   K 3.9 10/26/2019 1155   CL 104 10/26/2019 1155   CO2 25 10/26/2019 1155   GLUCOSE 200 (H) 10/26/2019 1155   BUN 14 10/26/2019 1155   CREATININE 1.09 10/26/2019 1155   CREATININE 0.94 06/12/2019 0852   CALCIUM 10.3 10/26/2019 1155   PROT 7.2 10/26/2019 1155   ALBUMIN 3.9 10/26/2019 1155   AST 28 10/26/2019 1155   ALT 32 10/26/2019 1155   ALKPHOS 102 10/26/2019 1155   BILITOT 0.8 10/26/2019 1155   GFRNONAA >60 10/26/2019 1155   GFRNONAA 89 06/12/2019 0852   GFRAA >60 10/26/2019 1155   GFRAA 103 06/12/2019 0852    Assessment:  1.  Chronic right upper quadrant pain: For the past 5 to 6 years off and on, various imaging studies, gallbladder is normal, also occasionally light-colored stool; consider gastric origin versus gallbladder dysfunction versus other 2.  History of adenomatous polyps: Last colonoscopy 3 years ago, patient is due for repeat 3.  Chronic anticoagulation status post aortic stent: With Plavix  Plan: 1.  Scheduled patient for an EGD and his surveillance colonoscopy  with Dr. Fuller Plan in the [facility].  Did discuss risks, benefits, limitations and alternatives and the patient agrees to proceed. 2.  Patient was advised to hold his Plavix for 5 days prior to time of procedure.  We will communicate with his prescribing physician to ensure this is acceptable for him. 3.  Started the patient on Nexium 40 mg twice daily, 30 to 60 minutes before eating breakfast and dinner #60 with 3 refills.  Instructed the patient to stop this medication after 2 weeks if it was not helping at all with his pain. 4.  In the future if EGD is normal would need to consider a HIDA scan with [facility].  Currently there is a Producer, television/film/video of radio tracer and so this would not be able to be scheduled until late September/early October if necessary. 5.  Patient to follow in clinic per recommendations from Dr. Fuller Plan after time of procedures.  Ellouise Newer, PA-C [facility] 12/02/2019, 3:27 PM  Cc: Nori Riis, PA-C

## 2019-12-02 NOTE — Telephone Encounter (Signed)
[facility] Pre-operative Risk Assessment     Request for surgical clearance:     Endoscopy Procedure  What type of surgery is being performed?     Endoscopy/Colonoscopy  When is this surgery scheduled?     Monday 02/01/20  What type of clearance is required ?   Pharmacy  Are there any medications that need to be held prior to surgery and how long? Plavix 5 days  Practice name and name of physician performing surgery?      [facility]  What is your office phone and fax number?      Phone- [phone]  [phone]  Anesthesia type (None, local, MAC, general) ?       MAC

## 2019-12-02 NOTE — Progress Notes (Signed)
Reviewed and agree with management plan.  Irasema Chalk T. Ariday Brinker, MD FACG [facility]  

## 2019-12-02 NOTE — Telephone Encounter (Signed)
   Primary Cardiologist: Kate Sable, MD  Chart reviewed as part of pre-operative protocol coverage. Patient was contacted 12/02/2019 in reference to pre-operative risk assessment for pending surgery as outlined below.  Antonio Russell was last seen on 10/23/19 by Dr. [name]. Has history of HTN, HLD, tobacco abuse, AAA s/p endoprosthetic repair 08/2019, peripheral arterial disease s/p R and L iliac stents, sleep apnea, mild nonobstructive CAD and EF 65% by cath 2012. RCRI 0.4% indicating low risk from cardiac standpoint. I spoke with patient who reports he has been feeling well without any concerning cardiac symptoms. Therefore, based on ACC/AHA guidelines, the patient would be at acceptable risk for the planned procedure without further cardiovascular testing.   The patient was advised that if he develops new symptoms prior to surgery to contact our office to arrange for a follow-up visit, and he verbalized understanding.  We are asked to provide clearance for his Plavix but he is not on this for cardiac reasons. This is managed by his vascular surgeon so would recommend GI team reach out to them for input.  I will route this recommendation to the requesting party via Epic fax function and remove from pre-op pool. Please call with questions.  Charlie Pitter, PA-C 12/02/2019, 4:52 PM

## 2019-12-02 NOTE — Patient Instructions (Addendum)
If you are age 59 or older, your body mass index should be between 23-30. Your Body mass index is 27.48 kg/m. If this is out of the aforementioned range listed, please consider follow up with your Primary Care Provider.  If you are age 59 or younger, your body mass index should be between 19-25. Your Body mass index is 27.48 kg/m. If this is out of the aformentioned range listed, please consider follow up with your Primary Care Provider.   We have sent the following medications to your pharmacy for you to pick up at your convenience: Nexium 40 mg twice daily 30-60 minutes before breakfast and dinner.  You will be contacted by our office prior to your procedure for directions on holding your Plavix.  If you do not hear from our office 1 week prior to your scheduled procedure, please call [phone] to discuss.   You have been scheduled for a colonoscopy. Please follow written instructions given to you at your visit today.  Please pick up your prep supplies at the pharmacy within the next 1-3 days. If you use inhalers (even only as needed), please bring them with you on the day of your procedure.  Due to recent changes in healthcare laws, you may see the results of your imaging and laboratory studies on MyChart before your provider has had a chance to review them.  We understand that in some cases there may be results that are confusing or concerning to you. Not all laboratory results come back in the same time frame and the provider may be waiting for multiple results in order to interpret others.  Please give [place] 48 hours in order for your provider to thoroughly review all the results before contacting the office for clarification of your results.

## 2019-12-04 ENCOUNTER — Telehealth: Payer: Self-pay | Admitting: *Deleted

## 2019-12-04 NOTE — Telephone Encounter (Signed)
° °  Antonio Russell [facility] [birth year] [id number]  Dear Dr. Lucky Cowboy :  We have scheduled the above named patient for a(n) endoscopy/colonoscopy procedure. Our records show that (s)he is on anticoagulation therapy.  Please advise as to whether the patient may come off their therapy of Plavix 5 days prior to their procedure which is scheduled for Monday 02/01/20.  Please route your response to Caryl Asp, [place] or fax response to [phone].  Sincerely,   Caryl Asp, [facility]

## 2019-12-08 ENCOUNTER — Other Ambulatory Visit: Payer: Self-pay | Admitting: *Deleted

## 2019-12-08 MED ORDER — PRALUENT 75 MG/ML ~~LOC~~ SOAJ: 1.0000 "pen " | SUBCUTANEOUS | 4 refills | Status: DC

## 2019-12-10 ENCOUNTER — Other Ambulatory Visit: Payer: Self-pay | Admitting: *Deleted

## 2019-12-10 MED ORDER — PRALUENT 75 MG/ML ~~LOC~~ SOAJ: 1.0000 "pen " | SUBCUTANEOUS | 4 refills | Status: DC

## 2019-12-22 ENCOUNTER — Ambulatory Visit (INDEPENDENT_AMBULATORY_CARE_PROVIDER_SITE_OTHER): Payer: 59 | Admitting: Vascular Surgery

## 2019-12-22 ENCOUNTER — Other Ambulatory Visit: Payer: Self-pay

## 2019-12-22 ENCOUNTER — Other Ambulatory Visit (INDEPENDENT_AMBULATORY_CARE_PROVIDER_SITE_OTHER): Payer: Self-pay | Admitting: Vascular Surgery

## 2019-12-22 ENCOUNTER — Ambulatory Visit (INDEPENDENT_AMBULATORY_CARE_PROVIDER_SITE_OTHER): Payer: 59

## 2019-12-22 ENCOUNTER — Encounter (INDEPENDENT_AMBULATORY_CARE_PROVIDER_SITE_OTHER): Payer: 59

## 2019-12-22 DIAGNOSIS — I739 Peripheral vascular disease, unspecified: Secondary | ICD-10-CM | POA: Diagnosis not present

## 2019-12-22 DIAGNOSIS — I6523 Occlusion and stenosis of bilateral carotid arteries: Secondary | ICD-10-CM

## 2019-12-25 ENCOUNTER — Encounter (INDEPENDENT_AMBULATORY_CARE_PROVIDER_SITE_OTHER): Payer: Self-pay | Admitting: Vascular Surgery

## 2019-12-25 ENCOUNTER — Ambulatory Visit (INDEPENDENT_AMBULATORY_CARE_PROVIDER_SITE_OTHER): Payer: 59 | Admitting: Vascular Surgery

## 2019-12-25 ENCOUNTER — Other Ambulatory Visit: Payer: Self-pay

## 2019-12-25 VITALS — BP 150/84 | HR 93 | Ht 72.0 in | Wt 200.0 lb

## 2019-12-25 DIAGNOSIS — I10 Essential (primary) hypertension: Secondary | ICD-10-CM | POA: Diagnosis not present

## 2019-12-25 DIAGNOSIS — I70213 Atherosclerosis of native arteries of extremities with intermittent claudication, bilateral legs: Secondary | ICD-10-CM

## 2019-12-25 DIAGNOSIS — I6523 Occlusion and stenosis of bilateral carotid arteries: Secondary | ICD-10-CM

## 2019-12-25 DIAGNOSIS — E7800 Pure hypercholesterolemia, unspecified: Secondary | ICD-10-CM

## 2019-12-25 DIAGNOSIS — I714 Abdominal aortic aneurysm, without rupture, unspecified: Secondary | ICD-10-CM

## 2019-12-25 DIAGNOSIS — I6529 Occlusion and stenosis of unspecified carotid artery: Secondary | ICD-10-CM | POA: Insufficient documentation

## 2019-12-25 NOTE — Assessment & Plan Note (Signed)
lipid control important in reducing the progression of atherosclerotic disease. Continue statin therapy  

## 2019-12-25 NOTE — Assessment & Plan Note (Signed)
We performed a carotid duplex today which revealed 1 to 39% bilateral carotid artery stenosis with mild disease.  On appropriate antiplatelet therapy.  This can be checked every 2 to 3 years.

## 2019-12-25 NOTE — Assessment & Plan Note (Signed)
ABIs today are normal at 1.1 bilaterally with brisk waveforms status post iliac intervention as part of his abdominal aortic aneurysm surgery 4 months ago.  Doing well.  Continue aspirin and Plavix although it is okay to stop these prior to his upcoming parathyroid surgery.  Plan to recheck in 6 months.

## 2019-12-25 NOTE — Assessment & Plan Note (Signed)
blood pressure control important in reducing the progression of atherosclerotic disease. On appropriate oral medications.  

## 2019-12-25 NOTE — Assessment & Plan Note (Signed)
About 4 months status post repair.  Doing well.  Plan to recheck an EVAR duplex in about 6 months.

## 2019-12-25 NOTE — Progress Notes (Signed)
MRN : [id number]  Antonio Russell is a 59 y.o. (1960-10-13) male who presents with chief complaint of  Chief Complaint  Patient presents with  . Follow-up    3 Mo U/S  .  History of Present Illness: Patient returns today in follow up of multiple vascular issues.  He has upcoming parathyroid surgery, and some calcification was seen on the CT scan of his neck by his surgeon.  Prior to proceeding with parathyroidectomy, evaluation for carotid disease was requested.  We performed a carotid duplex today which revealed 1 to 39% bilateral carotid artery stenosis with mild disease.  He would be stable and okay to proceed with his parathyroid surgery.  It should be safe to stop Plavix 5 days prior to his parathyroid surgery. He is about 4 months status post concomitant endovascular abdominal aortic aneurysm repair with iliac intervention for occlusive disease.  His claudication symptoms are basically gone.  His legs feel much better.  No new ulceration or infection. ABIs today are normal at 1.1 bilaterally with brisk waveforms status post iliac intervention as part of his abdominal aortic aneurysm surgery 4 months ago.  Current Outpatient Medications  Medication Sig Dispense Refill  . clopidogrel (PLAVIX) 75 MG tablet Take 1 tablet (75 mg total) by mouth daily at 6 (six) AM. 90 tablet 1  . fexofenadine (ALLEGRA) 60 MG tablet Take 60 mg by mouth daily.     . fluticasone (FLONASE) 50 MCG/ACT nasal spray SPRAY 2 SPRAYS INTO EACH NOSTRIL EVERY DAY 16 g 5  . metoprolol succinate (TOPROL-XL) 50 MG 24 hr tablet Take 1 tablet (50 mg total) by mouth daily. Take with or immediately following a meal. 30 tablet 6  . Alirocumab (PRALUENT) 75 MG/ML SOAJ Inject 1 pen into the skin every 14 (fourteen) days. (Patient not taking: Reported on 12/25/2019) 2 mL 4  . aspirin EC 81 MG tablet Take 1 tablet (81 mg total) by mouth daily. (Patient not taking: Reported on 12/25/2019) 90 tablet 3  . Cholecalciferol (VITAMIN D) 125  MCG (5000 UT) CAPS Take 1 capsule by mouth daily.  (Patient not taking: Reported on 12/25/2019)    . meloxicam (MOBIC) 15 MG tablet TAKE 1 TABLET (15 MG TOTAL) BY MOUTH DAILY AS NEEDED FOR PAIN (JOINT PAIN). WITH FOOD (Patient not taking: Reported on 12/25/2019) 30 tablet 3  . ondansetron (ZOFRAN ODT) 4 MG disintegrating tablet Take 1 tablet (4 mg total) by mouth every 8 (eight) hours as needed for nausea or vomiting. (Patient not taking: Reported on 12/25/2019) 20 tablet 0   No current facility-administered medications for this visit.    Past Medical History:  Diagnosis Date  . Allergic rhinitis   . Allergy   . Anxiety   . Arthritis   . Back pain   . Chest pain 2012   cardiac cath - nonobstructive, nl LV fxn, rec aggressive med management  . GERD (gastroesophageal reflux disease)   . Headache(784.0)   . HLD (hyperlipidemia)   . Hyperparathyroidism ([place])   . Hypertension   . Impotence of organic origin   . Other testicular hypofunction   . Personal history of urinary calculi   . Sleep apnea   . Tobacco use disorder   . Unspecified hearing loss     Past Surgical History:  Procedure Laterality Date  . COLON SURGERY    . COLONOSCOPY    . ENDOVASCULAR REPAIR/STENT GRAFT N/A 08/12/2019   Procedure: ENDOVASCULAR REPAIR/STENT GRAFT;  Surgeon: Algernon Huxley, MD;  Location: [facility];  Service: Cardiovascular;  Laterality: N/A;  . EXTRACORPOREAL SHOCK WAVE LITHOTRIPSY Right 10/29/2019   Procedure: EXTRACORPOREAL SHOCK WAVE LITHOTRIPSY (ESWL);  Surgeon: Billey Co, MD;  Location: [facility];  Service: Urology;  Laterality: Right;  . HEMORRHOID SURGERY    . KNEE SURGERY Right 2013  . NASAL SEPTUM SURGERY       Social History   Tobacco Use  . Smoking status: Former Smoker    Packs/day: 0.25    Years: 35.00    Pack years: 8.75    Types: Cigarettes    Quit date: 10/08/2019    Years since quitting: 0.2  . Smokeless tobacco: Former Network engineer  . Vaping Use: Never  used  Substance Use Topics  . Alcohol use: Yes    Alcohol/week: 18.0 standard drinks    Types: 18 Cans of beer per week  . Drug use: No     Family History  Problem Relation Age of Onset  . Hypertension Mother   . Hyperlipidemia Mother   . Diabetes Mother   . Hypertension Father   . Hyperlipidemia Father   . Cancer Father        throat  . Hypertension Sister   . Lung cancer Other        GF  . Heart disease Other        GM  . Esophageal cancer Paternal Uncle   . Colon cancer Neg Hx   . Stomach cancer Neg Hx   . Rectal cancer Neg Hx     Allergies  Allergen Reactions  . Sertraline Hcl Other (See Comments)    Tremors  . Amlodipine Other (See Comments)    Sexual side effects  . Crestor [Rosuvastatin Calcium] Other (See Comments)    Myalgia  . Lipitor [Atorvastatin Calcium] Other (See Comments)    Myalgia  . Zocor [Simvastatin - High Dose] Other (See Comments)    Myalgia      REVIEW OF SYSTEMS (Negative unless checked)  Constitutional: [] Weight loss  [] Fever  [] Chills Cardiac: [] Chest pain   [] Chest pressure   [] Palpitations   [] Shortness of breath when laying flat   [] Shortness of breath at rest   [] Shortness of breath with exertion. Vascular:  [] Pain in legs with walking   [] Pain in legs at rest   [] Pain in legs when laying flat   [x] Claudication   [] Pain in feet when walking  [] Pain in feet at rest  [] Pain in feet when laying flat   [] History of DVT   [] Phlebitis   [] Swelling in legs   [] Varicose veins   [] Non-healing ulcers Pulmonary:   [] Uses home oxygen   [] Productive cough   [] Hemoptysis   [] Wheeze  [] COPD   [] Asthma Neurologic:  [] Dizziness  [] Blackouts   [] Seizures   [] History of stroke   [] History of TIA  [] Aphasia   [] Temporary blindness   [] Dysphagia   [] Weakness or numbness in arms   [] Weakness or numbness in legs Musculoskeletal:  [] Arthritis   [] Joint swelling   [] Joint pain   [] Low back pain Hematologic:  [] Easy bruising  [] Easy bleeding    [] Hypercoagulable state   [] Anemic   Gastrointestinal:  [] Blood in stool   [] Vomiting blood  [] Gastroesophageal reflux/heartburn   [] Abdominal pain Genitourinary:  [] Chronic kidney disease   [] Difficult urination  [] Frequent urination  [] Burning with urination   [] Hematuria Skin:  [] Rashes   [] Ulcers   [] Wounds Psychological:  [] History of anxiety   []  History  of major depression.  Physical Examination  BP (!) 150/84   Pulse 93   Ht 6' (1.829 m)   Wt 200 lb (90.7 kg)   BMI 27.12 kg/m  Gen:  WD/WN, NAD Head: [place]/AT, No temporalis wasting. Ear/Nose/Throat: Hearing grossly intact, nares w/o erythema or drainage Eyes: Conjunctiva clear. Sclera non-icteric Neck: Supple.  Trachea midline Pulmonary:  Good air movement, no use of accessory muscles.  Cardiac: RRR, no JVD Vascular:  Vessel Right Left  Radial Palpable Palpable                          PT Palpable Palpable  DP Palpable Palpable   Gastrointestinal: soft, non-tender/non-distended. No guarding/reflex.  Musculoskeletal: M/S 5/5 throughout.  No deformity or atrophy. No edema. Neurologic: Sensation grossly intact in extremities.  Symmetrical.  Speech is fluent.  Psychiatric: Judgment intact, Mood & affect appropriate for pt's clinical situation. Dermatologic: No rashes or ulcers noted.  No cellulitis or open wounds.       Labs Recent Results (from the past 2160 hour(s))  Lipase, blood     Status: None   Collection Time: 10/26/19 11:55 AM  Result Value Ref Range   Lipase 37 11 - 51 U/L    Comment: Performed at [facility], [address] [place], [place] [address]  Comprehensive metabolic panel     Status: Abnormal   Collection Time: 10/26/19 11:55 AM  Result Value Ref Range   Sodium 137 135 - 145 mmol/L   Potassium 3.9 3.5 - 5.1 mmol/L   Chloride 104 98 - 111 mmol/L   CO2 25 22 - 32 mmol/L   Glucose, Bld 200 (H) 70 - 99 mg/dL    Comment: Glucose reference range applies only to samples taken after  fasting for at least 8 hours.   BUN 14 6 - 20 mg/dL   Creatinine, Ser 1.09 0.61 - 1.24 mg/dL   Calcium 10.3 8.9 - 10.3 mg/dL   Total Protein 7.2 6.5 - 8.1 g/dL   Albumin 3.9 3.5 - 5.0 g/dL   AST 28 15 - 41 U/L   ALT 32 0 - 44 U/L   Alkaline Phosphatase 102 38 - 126 U/L   Total Bilirubin 0.8 0.3 - 1.2 mg/dL   GFR calc non Af Amer >60 >60 mL/min   GFR calc Af Amer >60 >60 mL/min   Anion gap 8 5 - 15    Comment: Performed at [facility], [address] [place], [place] [address]  CBC     Status: None   Collection Time: 10/26/19 11:55 AM  Result Value Ref Range   WBC 9.4 4.0 - 10.5 K/uL   RBC 4.47 4.22 - 5.81 MIL/uL   Hemoglobin 15.0 13.0 - 17.0 g/dL   HCT 43.4 39 - 52 %   MCV 97.1 80.0 - 100.0 fL   MCH 33.6 26.0 - 34.0 pg   MCHC 34.6 30.0 - 36.0 g/dL   RDW 13.6 11.5 - 15.5 %   Platelets 243 150 - 400 K/uL   nRBC 0.0 0.0 - 0.2 %    Comment: Performed at [facility], [address] [place], [place] [address]  Urinalysis, Complete w Microscopic     Status: Abnormal   Collection Time: 10/26/19 11:55 AM  Result Value Ref Range   Color, Urine YELLOW (A) YELLOW   APPearance HAZY (A) CLEAR   Specific Gravity, Urine 1.019 1.005 - 1.030   pH 5.0 5.0 -  8.0   Glucose, UA 150 (A) NEGATIVE mg/dL   Hgb urine dipstick MODERATE (A) NEGATIVE   Bilirubin Urine NEGATIVE NEGATIVE   Ketones, ur NEGATIVE NEGATIVE mg/dL   Protein, ur NEGATIVE NEGATIVE mg/dL   Nitrite NEGATIVE NEGATIVE   Leukocytes,Ua NEGATIVE NEGATIVE   RBC / HPF 21-50 0 - 5 RBC/hpf   WBC, UA 6-10 0 - 5 WBC/hpf   Bacteria, UA NONE SEEN NONE SEEN   Squamous Epithelial / LPF NONE SEEN 0 - 5   Mucus PRESENT    Ca Oxalate Crys, UA PRESENT     Comment: Performed at [facility], [address] [place], [place] [address]  Lipid Profile     Status: Abnormal   Collection Time: 10/28/19  9:40 AM  Result Value Ref Range   Cholesterol 185 0 - 200 mg/dL   Triglycerides 224 (H) <150 mg/dL   HDL 56 >40  mg/dL   Total CHOL/HDL Ratio 3.3 RATIO   VLDL 45 (H) 0 - 40 mg/dL   LDL Cholesterol 84 0 - 99 mg/dL    Comment:        Total Cholesterol/HDL:CHD Risk Coronary Heart Disease Risk Table                     Men   Women  1/2 Average Risk   3.4   3.3  Average Risk       5.0   4.4  2 X Average Risk   9.6   7.1  3 X Average Risk  23.4   11.0        Use the calculated Patient Ratio above and the CHD Risk Table to determine the patient's CHD Risk.        ATP III CLASSIFICATION (LDL):  <100     mg/dL   Optimal  100-129  mg/dL   Near or Above                    Optimal  130-159  mg/dL   Borderline  160-189  mg/dL   High  >190     mg/dL   Very High Performed at [facility], [address] [place], [place] [address]   Urinalysis, Complete     Status: None   Collection Time: 11/20/19  9:03 AM  Result Value Ref Range   Specific Gravity, UA 1.020 1.005 - 1.030   pH, UA 6.0 5.0 - 7.5   Color, UA Yellow Yellow   Appearance Ur Clear Clear   Leukocytes,UA Negative Negative   Protein,UA Negative Negative/Trace   Glucose, UA Negative Negative   Ketones, UA Negative Negative   RBC, UA Negative Negative   Bilirubin, UA Negative Negative   Urobilinogen, Ur 0.2 0.2 - 1.0 mg/dL   Nitrite, UA Negative Negative   Microscopic Examination See below:   Microscopic Examination     Status: None   Collection Time: 11/20/19  9:03 AM   Urine  Result Value Ref Range   WBC, UA 0-5 0 - 5 /hpf   RBC None seen 0 - 2 /hpf   Epithelial Cells (non renal) 0-10 0 - 10 /hpf   Bacteria, UA None seen None seen/Few    Radiology VAS [place] ABI WITH/WO TBI  Result Date: 12/25/2019 LOWER EXTREMITY DOPPLER STUDY Indications: Claudication.  Comparison Study: 07/06/2019 Performing Technologist: Almira Coaster RVS  Examination Guidelines: A complete evaluation includes at minimum, Doppler waveform signals and systolic blood pressure reading at the level of  bilateral brachial, anterior tibial, and posterior  tibial arteries, when vessel segments are accessible. Bilateral testing is considered an integral part of a complete examination. Photoelectric Plethysmograph (PPG) waveforms and toe systolic pressure readings are included as required and additional duplex testing as needed. Limited examinations for reoccurring indications may be performed as noted.  ABI Findings: +---------+------------------+-----+---------+--------+ Right    Rt Pressure (mmHg)IndexWaveform Comment  +---------+------------------+-----+---------+--------+ Brachial 143                                      +---------+------------------+-----+---------+--------+ ATA      160               1.05 triphasic         +---------+------------------+-----+---------+--------+ PTA      172               1.12 triphasic         +---------+------------------+-----+---------+--------+ [place]               0.90 Normal            +---------+------------------+-----+---------+--------+ +---------+------------------+-----+---------+-------+ Left     Lt Pressure (mmHg)IndexWaveform Comment +---------+------------------+-----+---------+-------+ Brachial 153                                     +---------+------------------+-----+---------+-------+ ATA      166               1.08 triphasic        +---------+------------------+-----+---------+-------+ PTA      170               1.11 triphasic        +---------+------------------+-----+---------+-------+ Great Toe148               0.97 Normal           +---------+------------------+-----+---------+-------+ +-------+-----------+-----------+------------+------------+ ABI/TBIToday's ABIToday's TBIPrevious ABIPrevious TBI +-------+-----------+-----------+------------+------------+ Right  1.12       .90        1.25        1.07         +-------+-----------+-----------+------------+------------+ Left   1.11       .97        1.21        1.06          +-------+-----------+-----------+------------+------------+ Bilateral ABIs appear decreased compared to prior study on 07/06/2019. Bilateral TBIs appear decreased compared to prior study on 07/06/2019.  Summary: Right: Resting right ankle-brachial index is within normal range. No evidence of significant right lower extremity arterial disease. The right toe-brachial index is normal. Left: Resting left ankle-brachial index is within normal range. No evidence of significant left lower extremity arterial disease. The left toe-brachial index is normal.  *See table(s) above for measurements and observations.  Electronically signed by [name] MD on 12/25/2019 at 8:14:24 AM.    Final    VAS US CAROTID  Result Date: 12/25/2019 Carotid Arterial Duplex Study Indications: Carotid artery disease. Performing Technologist: Almira Coaster RVS  Examination Guidelines: A complete evaluation includes B-mode imaging, spectral Doppler, color Doppler, and power Doppler as needed of all accessible portions of each vessel. Bilateral testing is considered an integral part of a complete examination. Limited examinations for reoccurring indications may be performed as noted.  Right Carotid Findings: +----------+--------+--------+--------+------------------+--------+  PSV cm/sEDV cm/sStenosisPlaque DescriptionComments +----------+--------+--------+--------+------------------+--------+ CCA Prox  109     28                                         +----------+--------+--------+--------+------------------+--------+ CCA Mid   110     29                                         +----------+--------+--------+--------+------------------+--------+ CCA Distal96      34                                         +----------+--------+--------+--------+------------------+--------+ ICA Prox  95      37                                         +----------+--------+--------+--------+------------------+--------+  ICA Mid   70      27                                         +----------+--------+--------+--------+------------------+--------+ ICA Distal62      27                                         +----------+--------+--------+--------+------------------+--------+ ECA       102     26                                         +----------+--------+--------+--------+------------------+--------+ +----------+--------+-------+--------+-------------------+           PSV cm/sEDV cmsDescribeArm Pressure (mmHG) +----------+--------+-------+--------+-------------------+ Subclavian105     0                                  +----------+--------+-------+--------+-------------------+ +---------+--------+--+--------+--+ VertebralPSV cm/s48EDV cm/s12 +---------+--------+--+--------+--+  Left Carotid Findings: +----------+--------+--------+--------+------------------+--------+           PSV cm/sEDV cm/sStenosisPlaque DescriptionComments +----------+--------+--------+--------+------------------+--------+ CCA Prox  121     35                                         +----------+--------+--------+--------+------------------+--------+ CCA Mid   121     39                                         +----------+--------+--------+--------+------------------+--------+ CCA Distal89      35                                         +----------+--------+--------+--------+------------------+--------+ ICA Prox  86  25                                         +----------+--------+--------+--------+------------------+--------+ ICA Mid   80      31                                         +----------+--------+--------+--------+------------------+--------+ ICA Distal79      39                                         +----------+--------+--------+--------+------------------+--------+ ECA       73      15                                          +----------+--------+--------+--------+------------------+--------+ +----------+--------+--------+--------+-------------------+           PSV cm/sEDV cm/sDescribeArm Pressure (mmHG) +----------+--------+--------+--------+-------------------+ Subclavian119     0                                   +----------+--------+--------+--------+-------------------+ +---------+--------+--+--------+--+ VertebralPSV cm/s59EDV cm/s23 +---------+--------+--+--------+--+   Summary: Right Carotid: Velocities in the right ICA are consistent with a 1-39% stenosis. Left Carotid: Velocities in the left ICA are consistent with a 1-39% stenosis. Vertebrals:  Bilateral vertebral arteries demonstrate antegrade flow. Subclavians: Normal flow hemodynamics were seen in bilateral subclavian              arteries. *See table(s) above for measurements and observations.  Electronically signed by [name] MD on 12/25/2019 at 8:14:29 AM.    Final     Assessment/Plan  Essential hypertension blood pressure control important in reducing the progression of atherosclerotic disease. On appropriate oral medications.   Hyperlipidemia lipid control important in reducing the progression of atherosclerotic disease. Continue statin therapy   Carotid stenosis We performed a carotid duplex today which revealed 1 to 39% bilateral carotid artery stenosis with mild disease.  On appropriate antiplatelet therapy.  This can be checked every 2 to 3 years.  AAA (abdominal aortic aneurysm) without rupture (HCC) About 4 months status post repair.  Doing well.  Plan to recheck an EVAR duplex in about 6 months.  Atherosclerosis of native arteries of extremity with intermittent claudication (HCC) ABIs today are normal at 1.1 bilaterally with brisk waveforms status post iliac intervention as part of his abdominal aortic aneurysm surgery 4 months ago.  Doing well.  Continue aspirin and Plavix although it is okay to stop these prior to his  upcoming parathyroid surgery.  Plan to recheck in 6 months.    [name], MD  12/25/2019 10:06 AM    This note was created with Dragon medical transcription system.  Any errors from dictation are purely unintentional

## 2020-01-04 NOTE — Telephone Encounter (Signed)
Informed patient to hold Plavix 5 days prior to procedure. Patient voiced understanding.

## 2020-01-05 ENCOUNTER — Ambulatory Visit (INDEPENDENT_AMBULATORY_CARE_PROVIDER_SITE_OTHER): Payer: 59 | Admitting: Internal Medicine

## 2020-01-05 ENCOUNTER — Other Ambulatory Visit: Payer: Self-pay

## 2020-01-05 ENCOUNTER — Encounter: Payer: Self-pay | Admitting: Internal Medicine

## 2020-01-05 VITALS — BP 120/82 | HR 76 | Ht 72.0 in | Wt 202.0 lb

## 2020-01-05 DIAGNOSIS — E210 Primary hyperparathyroidism: Secondary | ICD-10-CM | POA: Diagnosis not present

## 2020-01-05 DIAGNOSIS — E042 Nontoxic multinodular goiter: Secondary | ICD-10-CM | POA: Diagnosis not present

## 2020-01-05 DIAGNOSIS — E559 Vitamin D deficiency, unspecified: Secondary | ICD-10-CM

## 2020-01-05 LAB — TSH: TSH: 1.18 u[IU]/mL (ref 0.35–4.50)

## 2020-01-05 NOTE — Progress Notes (Signed)
Patient ID: Antonio Russell, male   DOB: 1961/01/28, 59 y.o.   MRN: [id number]   This visit occurred during the SARS-CoV-2 public health emergency.  Safety protocols were in place, including screening questions prior to the visit, additional usage of staff PPE, and extensive cleaning of exam room while observing appropriate contact time as indicated for disinfecting solutions.   HPI  Antonio Russell is a 59 y.o.-year-old male, initially referred by his PCP, Dr. Glori Bickers, returning for follow-up for hypercalcemia/hyperparathyroidism and vitamin D deficiency.  Last visit 6 months ago.  Since last visit, he was in the ED 10/2019 with a right ureteric kidney stone.  He had ESWL.  She was also found to have a AAA and had stenting in 08/2019.  Patient has had hypercalcemia for at least 10 years.   Reviewed pertinent labs: Lab Results  Component Value Date   PTH 60 06/12/2019   PTH 133 (H) 05/01/2019   PTH 137 (H) 01/13/2019   CALCIUM 10.3 10/26/2019   CALCIUM 10.5 (H) 09/15/2019   CALCIUM 10.6 (H) 08/13/2019   CALCIUM 10.8 (H) 08/10/2019   CALCIUM 11.4 (H) 06/12/2019   CALCIUM 11.9 (H) 05/01/2019   CALCIUM 11.5 (H) 01/13/2019   CALCIUM 11.1 (H) 01/13/2019   CALCIUM 11.5 (H) 11/04/2017   CALCIUM 11.0 (H) 10/26/2016   No history of osteoporosis/fracture/falls.  He is not on HCTZ.  At last visit phosphorus was low:  Lab Results  Component Value Date   PHOS 2.1 (L) 06/12/2019   PHOS 3.0 05/01/2019    Further investigation pointed towards primary hyperparathyroidism-elevated calcitriol, normal magnesium, elevated 24-hour urine calcium: Component     Latest Ref Rng & Units 06/12/2019          Vitamin D 1, 25 (OH) Total     18 - 72 pg/mL 120 (H)  Vitamin D3 1, 25 (OH)     pg/mL 68  Vitamin D2 1, 25 (OH)     pg/mL 52  Magnesium     1.5 - 2.5 mg/dL 2.2  Creatinine, 24H Ur     0.50 - 2.15 g/24 h 1.56  Calcium, 24H Urine     mg/24 h 401 (H)   FECa = 0.02%  At last visit, I referred  him to surgery.  He has had the following investigations: Technetium sestamibi scan (10/14/2019): Right lower pole possible parathyroid adenoma Thyroid ultrasound (10/14/2019): Small thyroid nodules: Parenchymal Echotexture: Normal Isthmus: Normal in size measuring 0.4 cm in diameter Right lobe: Normal in size measuring 4.8 x 2.0 x 1.6 cm Left lobe: Normal in size measuring 4.3 x 1.9 x 1.6 cm _________________________________________________________  Nodule # 1: Location: Right; Mid Maximum size: 1.2 cm; Other 2 dimensions: 1.2 x 0.8 cm Composition: solid/almost completely solid (2) Echogenicity: hypoechoic (2)  *Given size (>/= 1 - 1.4 cm) and appearance, a follow-up ultrasound in 1 year should be considered based on TI-RADS criteria. _________________________________________________________  Nodule # 2: Location: Right; Inferior Maximum size: 1.0 cm; Other 2 dimensions: 0.8 x 0.8 cm Composition: solid/almost completely solid (2) Echogenicity: hypoechoic (2)  *Given size (>/= 1 - 1.4 cm) and appearance, a follow-up ultrasound in 1 year should be considered based on TI-RADS criteria. _________________________________________________________  There are 2 punctate, approximately 0.8 cm) hypoechoic nodules within the mid (labeled 3) and inferior (labeled 4) aspects the left lobe of the thyroid, neither of which meet criteria to recommend percutaneous sampling or continued dedicated follow-up.  No definitive extra thyroidal nodules are identified to suggest  the presence of a parathyroid adenoma.  IMPRESSION: 1. Findings suggestive of multinodular goiter. 2. Nodules #1 and #2 both meet imaging criteria to recommend a 1 year follow-up as indicated. 3. No definitive extra thyroidal nodules to suggest the presence of a parathyroid adenoma.  4D CT (11/06/2019): Carotid stenosis but no convincing parathyroid lesion.   Since then, he saw vascular surgery on 12/25/2019 >> mild  >> he was cleared for surgery.  Parathyroid surgery is planned with Dr. Harlow Asa. He is trying to get in touch with his office to schedule next appt.  No history of CKD. Last BUN/Cr: Lab Results  Component Value Date   BUN 14 10/26/2019   BUN 23 (H) 09/15/2019   CREATININE 1.09 10/26/2019   CREATININE 1.05 09/15/2019   He has a history of vitamin D deficiency: Lab Results  Component Value Date   VD25OH 27.43 (L) 06/05/2019   VD25OH 19.52 (L) 05/01/2019   VD25OH 15.29 (L) 01/13/2019   At last visit, for 6 out of 7 days, he was taking: 2000 IU daily (started 0/2020) + ergocalciferol 50,000 units weekly (started 04/2019).  However, since vitamin D level is still slightly low, I advised him to increase the dose of vitamin D to 5000 units daily  No family history of hypercalcemia, pituitary tumors.  He has a half sister with thyroid cancer.  Pt. also has a history of L posterior thigh muscles pain and weakness 4 years ago >> now bilateral >> also anterior aspects of thighs.  He also has a h/o chest pain - s/p catheterization, HTN, HL, high WBC.  ROS: Constitutional: no weight gain/no weight loss, + fatigue, no subjective hyperthermia, no subjective hypothermia Eyes: no blurry vision, no xerophthalmia ENT: no sore throat, no nodules palpated in neck, no dysphagia, no odynophagia, no hoarseness Cardiovascular: no CP/no SOB/no palpitations/no leg swelling Respiratory: no cough/no SOB/no wheezing Gastrointestinal: no N/no V/no D/no C/no acid reflux Musculoskeletal: + muscle aches/+ joint aches Skin: no rashes, no hair loss Neurological: no tremors/no numbness/no tingling/no dizziness  I reviewed pt's medications, allergies, PMH, social hx, family hx, and changes were documented in the history of present illness. Otherwise, unchanged from my initial visit note.  Past Medical History:  Diagnosis Date  . Allergic rhinitis   . Allergy   . Anxiety   . Arthritis   . Back pain   . Chest  pain 2012   cardiac cath - nonobstructive, nl LV fxn, rec aggressive med management  . GERD (gastroesophageal reflux disease)   . Headache(784.0)   . HLD (hyperlipidemia)   . Hyperparathyroidism ([place])   . Hypertension   . Impotence of organic origin   . Other testicular hypofunction   . Personal history of urinary calculi   . Sleep apnea   . Tobacco use disorder   . Unspecified hearing loss    Past Surgical History:  Procedure Laterality Date  . COLON SURGERY    . COLONOSCOPY    . ENDOVASCULAR REPAIR/STENT GRAFT N/A 08/12/2019   Procedure: ENDOVASCULAR REPAIR/STENT GRAFT;  Surgeon: Algernon Huxley, MD;  Location: [facility];  Service: Cardiovascular;  Laterality: N/A;  . EXTRACORPOREAL SHOCK WAVE LITHOTRIPSY Right 10/29/2019   Procedure: EXTRACORPOREAL SHOCK WAVE LITHOTRIPSY (ESWL);  Surgeon: Billey Co, MD;  Location: [facility];  Service: Urology;  Laterality: Right;  . HEMORRHOID SURGERY    . KNEE SURGERY Right 2013  . NASAL SEPTUM SURGERY     Social History   Socioeconomic History  . Marital  status: Married    Spouse name: Not on file  . Number of children: 3  . Years of education: Not on file  . Highest education level: Not on file  Occupational History  .  Glass blower/designer  Tobacco Use  . Smoking status: Current Every Day Smoker    Packs/day: 0.50    Years: 35.00    Pack years: 17.50    Types: Cigarettes  . Smokeless tobacco: Former Network engineer and Sexual Activity  . Alcohol use: Yes    Alcohol/week:  3-4 beers 5 days a week      . Drug use: No  . Sexual activity: Not on file  Other Topics Concern  . Not on file  Social History Narrative   Coaches basketball   Social Determinants of Health   Financial Resource Strain:   . Difficulty of Paying Living Expenses: Not on file  Food Insecurity:   . Worried About Charity fundraiser in the Last Year: Not on file  . Ran Out of Food in the Last Year: Not on file  Transportation Needs:   . Lack  of Transportation (Medical): Not on file  . Lack of Transportation (Non-Medical): Not on file  Physical Activity:   . Days of Exercise per Week: Not on file  . Minutes of Exercise per Session: Not on file  Stress:   . Feeling of Stress : Not on file  Social Connections:   . Frequency of Communication with Friends and Family: Not on file  . Frequency of Social Gatherings with Friends and Family: Not on file  . Attends Religious Services: Not on file  . Active Member of Clubs or Organizations: Not on file  . Attends Archivist Meetings: Not on file  . Marital Status: Not on file  Intimate Partner Violence:   . Fear of Current or Ex-Partner: Not on file  . Emotionally Abused: Not on file  . Physically Abused: Not on file  . Sexually Abused: Not on file   Current Outpatient Medications on File Prior to Visit  Medication Sig Dispense Refill  . Alirocumab (PRALUENT) 75 MG/ML SOAJ Inject 1 pen into the skin every 14 (fourteen) days. (Patient not taking: Reported on 12/25/2019) 2 mL 4  . aspirin EC 81 MG tablet Take 1 tablet (81 mg total) by mouth daily. (Patient not taking: Reported on 12/25/2019) 90 tablet 3  . Cholecalciferol (VITAMIN D) 125 MCG (5000 UT) CAPS Take 1 capsule by mouth daily.  (Patient not taking: Reported on 12/25/2019)    . clopidogrel (PLAVIX) 75 MG tablet Take 1 tablet (75 mg total) by mouth daily at 6 (six) AM. 90 tablet 1  . fexofenadine (ALLEGRA) 60 MG tablet Take 60 mg by mouth daily.     . fluticasone (FLONASE) 50 MCG/ACT nasal spray SPRAY 2 SPRAYS INTO EACH NOSTRIL EVERY DAY 16 g 5  . meloxicam (MOBIC) 15 MG tablet TAKE 1 TABLET (15 MG TOTAL) BY MOUTH DAILY AS NEEDED FOR PAIN (JOINT PAIN). WITH FOOD (Patient not taking: Reported on 12/25/2019) 30 tablet 3  . metoprolol succinate (TOPROL-XL) 50 MG 24 hr tablet Take 1 tablet (50 mg total) by mouth daily. Take with or immediately following a meal. 30 tablet 6  . ondansetron (ZOFRAN ODT) 4 MG disintegrating tablet  Take 1 tablet (4 mg total) by mouth every 8 (eight) hours as needed for nausea or vomiting. (Patient not taking: Reported on 12/25/2019) 20 tablet 0   No current facility-administered medications  on file prior to visit.   Allergies  Allergen Reactions  . Sertraline Hcl Other (See Comments)    Tremors  . Amlodipine Other (See Comments)    Sexual side effects  . Crestor [Rosuvastatin Calcium] Other (See Comments)    Myalgia  . Lipitor [Atorvastatin Calcium] Other (See Comments)    Myalgia  . Zocor [Simvastatin - High Dose] Other (See Comments)    Myalgia    Family History  Problem Relation Age of Onset  . Hypertension Mother   . Hyperlipidemia Mother   . Diabetes Mother   . Hypertension Father   . Hyperlipidemia Father   . Cancer Father        throat  . Hypertension Sister   . Lung cancer Other        GF  . Heart disease Other        GM  . Esophageal cancer Paternal Uncle   . Colon cancer Neg Hx   . Stomach cancer Neg Hx   . Rectal cancer Neg Hx    PE: BP 120/82   Pulse 76   Ht 6' (1.829 m)   Wt 202 lb (91.6 kg)   SpO2 98%   BMI 27.40 kg/m  Wt Readings from Last 3 Encounters:  01/05/20 202 lb (91.6 kg)  12/25/19 200 lb (90.7 kg)  12/02/19 198 lb 8 oz (90 kg)   Constitutional: overweight, in NAD Eyes: PERRLA, EOMI, no exophthalmos ENT: moist mucous membranes, no thyromegaly, no cervical lymphadenopathy Cardiovascular: RRR, No MRG Respiratory: CTA B Gastrointestinal: abdomen soft, NT, ND, BS+ Musculoskeletal: no deformities, strength intact in all 4 Skin: moist, warm, no rashes Neurological: no tremor with outstretched hands, DTR normal in all 4  Assessment: 1. Hypercalcemia/hyperparathyroidism  2.  Vitamin D deficiency  3.  Thyroid nodules  Plan: Patient has had several instances of elevated calcium, with the highest level being at 11.9.  An intact PTH level was also high, at 137 for calcium of 11.1-11.5.  -She also has a history of vitamin D  deficiency with a level of 15 in 01/2019, when he was started on 50,000 units of ergocalciferol weekly.  He added daily vitamin D level to this regimen but at last visit vitamin D was still under the lower limit of normal so we increased the dose (the next problem). -He had kidney stones in the past, last since last visit and had to have ESWL.  No known history of osteoporosis or fractures. -At last visit, we investigated him for primary hyperparathyroidism and the investigation was positive.  He had a high calcium, high PTH, high 24-hour urine calcium (with a fractional excretion of calcium higher than 0.01), low phosphorus, normal magnesium, and high calcitriol. -At that point, I referred him to surgery.  He had at the edition sestamibi scan which showed a possible right lower pole adenoma.  Of note, a thyroid ultrasound and a 4D CT did not show an adenoma. -He will return to see Dr. Harlow Asa for exploratory parathyroidectomy - I will see the patient back in 4 months  2.  Vitamin D deficiency -He had low vitamin D levels in the past, but improved at last check, although level was still under the lower limit of normal. -We increase his vitamin D to 5000 units daily -We will recheck his level today  3.  Thyroid nodules -No neck compression symptoms -Latest TSH was normal: Lab Results  Component Value Date   TSH 1.12 01/13/2019  -will recheck this today -Plan  to repeat a thyroid ultrasound 1 year from the previous  Component     Latest Ref Rng & Units 01/05/2020  TSH     0.35 - 4.50 uIU/mL 1.18  Vitamin D, 25-Hydroxy     30.0 - 100.0 ng/mL 29.1 (L)  Vitamin D level is now very close to the lower limit of normal.  I will check if he is taking his supplement consistently and repeat a vitamin D level at next visit, but will not change the supplement dose for now.  Philemon Kingdom, MD PhD [facility] End[facility]in[facility]

## 2020-01-05 NOTE — Patient Instructions (Signed)
Please stop at the lab.  Please continue vitamin d 5000 units daily.  Please come back for a follow-up appointment in 4 months.

## 2020-01-06 LAB — VITAMIN D 25 HYDROXY (VIT D DEFICIENCY, FRACTURES): Vit D, 25-Hydroxy: 29.1 ng/mL — ABNORMAL LOW (ref 30.0–100.0)

## 2020-01-07 ENCOUNTER — Ambulatory Visit: Payer: Self-pay | Admitting: Surgery

## 2020-02-01 ENCOUNTER — Encounter: Payer: Self-pay | Admitting: Gastroenterology

## 2020-02-01 ENCOUNTER — Ambulatory Visit (AMBULATORY_SURGERY_CENTER): Payer: 59 | Admitting: Gastroenterology

## 2020-02-01 ENCOUNTER — Other Ambulatory Visit: Payer: Self-pay

## 2020-02-01 VITALS — BP 131/78 | HR 68 | Temp 98.0°F | Resp 17 | Ht 71.0 in | Wt 198.0 lb

## 2020-02-01 DIAGNOSIS — D125 Benign neoplasm of sigmoid colon: Secondary | ICD-10-CM | POA: Diagnosis not present

## 2020-02-01 DIAGNOSIS — D123 Benign neoplasm of transverse colon: Secondary | ICD-10-CM

## 2020-02-01 DIAGNOSIS — Z86010 Personal history of colonic polyps: Secondary | ICD-10-CM | POA: Diagnosis not present

## 2020-02-01 DIAGNOSIS — K2970 Gastritis, unspecified, without bleeding: Secondary | ICD-10-CM

## 2020-02-01 DIAGNOSIS — K2100 Gastro-esophageal reflux disease with esophagitis, without bleeding: Secondary | ICD-10-CM

## 2020-02-01 DIAGNOSIS — R1011 Right upper quadrant pain: Secondary | ICD-10-CM

## 2020-02-01 HISTORY — PX: COLONOSCOPY: SHX174

## 2020-02-01 MED ORDER — PANTOPRAZOLE SODIUM 40 MG PO TBEC: 40.0000 mg | DELAYED_RELEASE_TABLET | Freq: Every day | ORAL | 3 refills | Status: DC

## 2020-02-01 MED ORDER — SODIUM CHLORIDE 0.9 % IV SOLN: 500.0000 mL | Freq: Once | INTRAVENOUS | Status: DC

## 2020-02-01 NOTE — Op Note (Signed)
[facility] Patient Name: Antonio Russell Procedure Date: 02/01/2020 10:58 AM MRN: [id number] Endoscopist: Ladene Artist , MD Age: 59 Referring MD:  Date of Birth: 10-15-1960 Gender: Male Account #: [id number] Procedure:                Colonoscopy Indications:              Surveillance: Personal history of adenomatous                            polyps and sessile serrated polyps, last                            colonoscopy 3 years ago Medicines:                Monitored Anesthesia Care Procedure:                Pre-Anesthesia Assessment:                           - Prior to the procedure, a History and Physical                            was performed, and patient medications and                            allergies were reviewed. The patient's tolerance of                            previous anesthesia was also reviewed. The risks                            and benefits of the procedure and the sedation                            options and risks were discussed with the patient.                            All questions were answered, and informed consent                            was obtained. Prior Anticoagulants: The patient has                            taken Plavix (clopidogrel), last dose was 5 days                            prior to procedure. ASA Grade Assessment: III - A                            patient with severe systemic disease. After                            reviewing the risks and benefits, the patient was  deemed in satisfactory condition to undergo the                            procedure.                           After obtaining informed consent, the colonoscope                            was passed under direct vision. Throughout the                            procedure, the patient's blood pressure, pulse, and                            oxygen saturations were monitored continuously. The                             Colonoscope was introduced through the anus and                            advanced to the the cecum, identified by                            appendiceal orifice and ileocecal valve. The                            ileocecal valve, appendiceal orifice, and rectum                            were photographed. The quality of the bowel                            preparation was adequate after extensive lavage,                            suction. The colonoscopy was performed without                            difficulty. The patient tolerated the procedure                            well. Scope In: 11:02:44 AM Scope Out: 11:21:01 AM Scope Withdrawal Time: 0 hours 16 minutes 6 seconds  Total Procedure Duration: 0 hours 18 minutes 17 seconds  Findings:                 The perianal and digital rectal examinations were                            normal.                           Three sessile polyps were found in the sigmoid  colon (1) and transverse colon (2). The polyps were                            5 to 8 mm in size. These polyps were removed with a                            cold snare. Resection and retrieval were complete.                           Internal hemorrhoids were found during                            retroflexion. The hemorrhoids were small and Grade                            I (internal hemorrhoids that do not prolapse).                           The exam was otherwise without abnormality on                            direct and retroflexion views. Complications:            No immediate complications. Estimated blood loss:                            None. Estimated Blood Loss:     Estimated blood loss: none. Impression:               - Three 5 to 8 mm polyps in the sigmoid colon and                            in the transverse colon, removed with a cold snare.                            Resected and retrieved.                           -  Internal hemorrhoids.                           - The examination was otherwise normal on direct                            and retroflexion views. Recommendation:           - Repeat colonoscopy date to be determined, likely                            3 years, after pending pathology results are                            reviewed for surveillance based on pathology  results, with a more extensive bowel prep.                           - Resume Plavix (clopidogrel) in 2 days at prior                            dose. Refer to managing physician for further                            adjustment of therapy.                           - Patient has a contact number available for                            emergencies. The signs and symptoms of potential                            delayed complications were discussed with the                            patient. Return to normal activities tomorrow.                            Written discharge instructions were provided to the                            patient.                           - Resume previous diet.                           - Continue present medications.                           - Await pathology results. Ladene Artist, MD 02/01/2020 11:34:31 AM This report has been signed electronically.

## 2020-02-01 NOTE — Op Note (Signed)
Antonio Russell Name: Antonio Russell Procedure Date: 02/01/2020 10:58 AM MRN: [id number] Endoscopist: Ladene Artist , MD Age: 59 Referring MD:  Date of Birth: [birth year] Gender: Male Account #: [id number] Procedure:                Upper GI endoscopy Indications:              Abdominal pain in the right upper quadrant Medicines:                Monitored Anesthesia Care Procedure:                Pre-Anesthesia Assessment:                           - Prior to the procedure, a History and Physical                            was performed, and Russell medications and                            allergies were reviewed. The Russell's tolerance of                            previous anesthesia was also reviewed. The risks                            and benefits of the procedure and the sedation                            options and risks were discussed with the Russell.                            All questions were answered, and informed consent                            was obtained. Prior Anticoagulants: The Russell has                            taken Plavix (clopidogrel), last dose was 5 days                            prior to procedure. ASA Grade Assessment: III - A                            Russell with severe systemic disease. After                            reviewing the risks and benefits, the Russell was                            deemed in satisfactory condition to undergo the                            procedure.  After obtaining informed consent, the endoscope was                            passed under direct vision. Throughout the                            procedure, the Russell's blood pressure, pulse, and                            oxygen saturations were monitored continuously. The                            Endoscope was introduced through the mouth, and                            advanced to the second part of duodenum. The upper                             GI endoscopy was accomplished without difficulty.                            The Russell tolerated the procedure well. Scope In: Scope Out: Findings:                 LA Grade A (one or more mucosal breaks less than 5                            mm, not extending between tops of 2 mucosal folds)                            esophagitis with no bleeding was found in the                            distal esophagus.                           The exam of the esophagus was otherwise normal.                           Patchy moderate inflammation characterized by                            erythema and granularity was found in the entire                            examined stomach. Biopsies were taken with a cold                            forceps for histology.                           The exam of the stomach was otherwise normal.  Patchy moderately erythematous mucosa without                            active bleeding and with no stigmata of bleeding                            was found in the duodenal bulb.                           The exam of the duodenum was otherwise normal. Complications:            No immediate complications. Estimated Blood Loss:     Estimated blood loss was minimal. Impression:               - LA Grade A reflux esophagitis with no bleeding.                           - Gastritis. Biopsied.                           - Erythematous duodenopathy. Recommendation:           - Russell has a contact number available for                            emergencies. The signs and symptoms of potential                            delayed complications were discussed with the                            Russell. Return to normal activities tomorrow.                            Written discharge instructions were provided to the                            Russell.                           - Resume previous diet.                            - Antireflux measures.                           - Continue present medications.                           - Await pathology results.                           - Protonix (pantoprazole) 40 mg PO daily, 1 year of                            refills.                           -  Resume Plavix (clopidogrel) at prior dose in 2                            days. Refer to managing physician for further                            adjustment of therapy. Ladene Artist, MD 02/01/2020 11:39:42 AM This report has been signed electronically.

## 2020-02-01 NOTE — Progress Notes (Signed)
[facility] - Check-in  KW - VS  Last dose of Plavix 01-22-20 reported by pt.  Sign hanging on IV pole.

## 2020-02-01 NOTE — Progress Notes (Signed)
Called to room to assist during endoscopic procedure.  Patient ID and intended procedure confirmed with present staff. Received instructions for my participation in the procedure from the performing physician.  

## 2020-02-01 NOTE — Progress Notes (Signed)
1130 HR > 100 with esmolol 20  mg given IV, MD updated, vss

## 2020-02-01 NOTE — Progress Notes (Signed)
1059 Robinul 0.1 mg IV given due large amount of secretions upon assessment.  MD made aware, vss 

## 2020-02-01 NOTE — Progress Notes (Signed)
Report given to PACU, vss 

## 2020-02-01 NOTE — Patient Instructions (Addendum)
Impression/Recommendations:  Gastritis, esophagitis, polyp, GERD, and hemorrhoid handouts given to patient.  Resume previous diet. Antireflux measures.  Continue present medications. Protonix (Pantoprazole) 40 mg. By mouth daily.  Resume Plavix (clopidogrel) at prior dose in 2 days.  Refer to managing physician for further adjustment of therapy.  Repeat colonoscopy likely in 3 years.  Date to be determined after pathology results reviewed.  YOU HAD AN ENDOSCOPIC PROCEDURE TODAY AT [facility]:   Refer to the procedure report that was given to you for any specific questions about what was found during the examination.  If the procedure report does not answer your questions, please call your gastroenterologist to clarify.  If you requested that your care partner not be given the details of your procedure findings, then the procedure report has been included in a sealed envelope for you to review at your convenience later.  YOU SHOULD EXPECT: Some feelings of bloating in the abdomen. Passage of more gas than usual.  Walking can help get rid of the air that was put into your GI tract during the procedure and reduce the bloating. If you had a lower endoscopy (such as a colonoscopy or flexible sigmoidoscopy) you may notice spotting of blood in your stool or on the toilet paper. If you underwent a bowel prep for your procedure, you may not have a normal bowel movement for a few days.  Please Note:  You might notice some irritation and congestion in your nose or some drainage.  This is from the oxygen used during your procedure.  There is no need for concern and it should clear up in a day or so.  SYMPTOMS TO REPORT IMMEDIATELY:   Following lower endoscopy (colonoscopy or flexible sigmoidoscopy):  Excessive amounts of blood in the stool  Significant tenderness or worsening of abdominal pains  Swelling of the abdomen that is new, acute  Fever of 100F or higher   Following upper  endoscopy (EGD)  Vomiting of blood or coffee ground material  New chest pain or pain under the shoulder blades  Painful or persistently difficult swallowing  New shortness of breath  Fever of 100F or higher  Black, tarry-looking stools  For urgent or emergent issues, a gastroenterologist can be reached at any hour by calling [phone]. Do not use MyChart messaging for urgent concerns.    DIET:  We do recommend a small meal at first, but then you may proceed to your regular diet.  Drink plenty of fluids but you should avoid alcoholic beverages for 24 hours.  ACTIVITY:  You should plan to take it easy for the rest of today and you should NOT DRIVE or use heavy machinery until tomorrow (because of the sedation medicines used during the test).    FOLLOW UP: Our staff will call the number listed on your records 48-72 hours following your procedure to check on you and address any questions or concerns that you may have regarding the information given to you following your procedure. If we do not reach you, we will leave a message.  We will attempt to reach you two times.  During this call, we will ask if you have developed any symptoms of COVID 19. If you develop any symptoms (ie: fever, flu-like symptoms, shortness of breath, cough etc.) before then, please call [phone].  If you test positive for Covid 19 in the 2 weeks post procedure, please call and report this information to [place].    If any biopsies were taken  you will be contacted by phone or by letter within the next 1-3 weeks.  Please call us at [phone] if you have not heard about the biopsies in 3 weeks.    SIGNATURES/CONFIDENTIALITY: You and/or your care partner have signed paperwork which will be entered into your electronic medical record.  These signatures attest to the fact that that the information above on your After Visit Summary has been reviewed and is understood.  Full responsibility of the confidentiality of this  discharge information lies with you and/or your care-partner.

## 2020-02-03 ENCOUNTER — Telehealth: Payer: Self-pay

## 2020-02-03 NOTE — Telephone Encounter (Signed)
  Follow up Call-  Call back number 02/01/2020  Post procedure Call Back phone  # [phone] cell  Permission to leave phone message Yes  Some recent data might be hidden     Patient questions:  Do you have a fever, pain , or abdominal swelling? No. Pain Score  0 *  Have you tolerated food without any problems? Yes.    Have you been able to return to your normal activities? Yes.    Do you have any questions about your discharge instructions: Diet   No. Medications  No. Follow up visit  No.  Do you have questions or concerns about your Care? No.  Actions: * If pain score is 4 or above: No action needed, pain <4. 1. Have you developed a fever since your procedure? no  2.   Have you had an respiratory symptoms (SOB or cough) since your procedure? no  3.   Have you tested positive for COVID 19 since your procedure no  4.   Have you had any family members/close contacts diagnosed with the COVID 19 since your procedure?  no   If yes to any of these questions please route to Joylene John, RN and Joella Prince, RN

## 2020-02-06 ENCOUNTER — Encounter (HOSPITAL_COMMUNITY): Payer: Self-pay | Admitting: Surgery

## 2020-02-06 NOTE — H&P (Signed)
General Surgery [facility]  Antonio Russell DOB: [birth year] Married / Language: English / Race: White Male   History of Present Illness  The patient is a 59 year old male who presents with primary hyperparathyroidism.  CHIEF COMPLAINT: primary hyperparathyroidism  Patient is referred by Dr. Philemon Kingdom for surgical evaluation and management of primary hyperparathyroidism. Patient had been noted for several years to have elevated serum calcium levels. Laboratory studies today demonstrate calcium ranging from 11.0-11.9. Patient underwent evaluation demonstrating intact PTH levels ranging from 60-137. Vitamin D level was normal to mildly elevated. 24-hour urine collection for calcium was performed and was elevated at 401. Patient has not had any imaging studies. Patient notes bone and joint pain, particularly in his knees and hips. He is unsure whether or not he has ever had a kidney stone. He does complain of chronic fatigue. There is no family history of parathyroid disease. Patient has had no prior head or neck surgery. 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 Patient is also undergoing evaluation by vascular surgery. He may need to have an endovascular procedure performed in the near future. This would also involve taking antiplatelet medications such as Plavix and aspirin.   Past Surgical History  Colon Polyp Removal - Colonoscopy  Hemorrhoidectomy  Knee Surgery  Right. Oral Surgery   Diagnostic Studies History  Colonoscopy  1-5 years ago  Allergies Statins  Muscle Pain Zoloft *ANTIDEPRESSANTS*  Uncontrollable facial movements Allergies Reconciled   Medication History  Chantix (1MG  Tablet, Oral) Active. Metoprolol Succinate ER (100MG  Tablet ER 24HR, Oral) Active. Vitamin D (Ergocalciferol) (1.25 MG(50000 UT) Capsule, Oral) Active. Praluent (75MG /ML Soln  Auto-inj, Subcutaneous) Active. Flonase (50MCG/DOSE Inhaler, Nasal) Active. Vitamin D (50000U Tablet, Oral) Active. Medications Reconciled  Social History  Alcohol use  Moderate alcohol use. Caffeine use  Carbonated beverages, Coffee, Tea. Illicit drug use  Prefer to discuss with provider. Tobacco use  Current every day smoker.  Family History  Colon Cancer  Brother. Colon Polyps  Brother, Father, Mother. Diabetes Mellitus  Mother.  Other Problems  Back Pain  Gastroesophageal Reflux Disease  Hemorrhoids  High blood pressure  Hypercholesterolemia  Vascular Disease   Review of Systems  General Present- Fatigue. Not Present- Appetite Loss, Chills, Fever, Night Sweats, Weight Gain and Weight Loss. Skin Not Present- Change in Wart/Mole, Dryness, Hives, Jaundice, New Lesions, Non-Healing Wounds, Rash and Ulcer. HEENT Present- Seasonal Allergies and Visual Disturbances. Not Present- Earache, Hearing Loss, Hoarseness, Nose Bleed, Oral Ulcers, Ringing in the Ears, Sinus Pain, Sore Throat, Wears glasses/contact lenses and Yellow Eyes. Respiratory Present- Snoring. Not Present- Bloody sputum, Chronic Cough, Difficulty Breathing and Wheezing. Breast Not Present- Breast Mass, Breast Pain, Nipple Discharge and Skin Changes. Cardiovascular Present- Leg Cramps. Not Present- Chest Pain, Difficulty Breathing Lying Down, Palpitations, Rapid Heart Rate, Shortness of Breath and Swelling of Extremities. Gastrointestinal Not Present- Abdominal Pain, Bloating, Bloody Stool, Change in Bowel Habits, Chronic diarrhea, Constipation, Difficulty Swallowing, Excessive gas, Gets full quickly at meals, Hemorrhoids, Indigestion, Nausea, Rectal Pain and Vomiting. Male Genitourinary Not Present- Blood in Urine, Change in Urinary Stream, Frequency, Impotence, Nocturia, Painful Urination, Urgency and Urine Leakage. Musculoskeletal Present- Joint Pain, Joint Stiffness and Muscle Pain. Not Present- Back  Pain, Muscle Weakness and Swelling of Extremities. Neurological Present- Decreased Memory, Tingling and Weakness. Not Present- Fainting, Headaches, Numbness, Seizures, Tremor and Trouble walking. Psychiatric Not Present- Anxiety, Bipolar, Change in Sleep Pattern, Depression, Fearful and Frequent crying.  Endocrine Not Present- Cold Intolerance, Excessive Hunger, Hair Changes, Heat Intolerance, Hot flashes and New Diabetes. Hematology Not Present- Blood Thinners, Easy Bruising, Excessive bleeding, Gland problems, HIV and Persistent Infections.  Vitals  Weight: 196.2 lb Height: 72in Body Surface Area: 2.11 m Body Mass Index: 26.61 kg/m  Temp.: 97.20F  Pulse: 87 (Regular)  BP: 134/72(Sitting, Left Arm, Standard)  Physical Exam  GENERAL APPEARANCE Development: normal Nutritional status: normal Gross deformities: none  SKIN Rash, lesions, ulcers: none Induration, erythema: none Nodules: none palpable  EYES Conjunctiva and lids: normal Pupils: equal and reactive Iris: normal bilaterally  EARS, NOSE, MOUTH, THROAT External ears: no lesion or deformity External nose: no lesion or deformity Hearing: grossly normal Due to Covid-19 pandemic, patient is wearing a mask.  NECK Symmetric: yes Trachea: midline Thyroid: no palpable nodules in the thyroid bed  CHEST Respiratory effort: normal Retraction or accessory muscle use: no Breath sounds: normal bilaterally Rales, rhonchi, wheeze: none  CARDIOVASCULAR Auscultation: regular rhythm, normal rate Murmurs: none Pulses: radial pulse 2+ palpable Lower extremity edema: none  MUSCULOSKELETAL Station and gait: normal Digits and nails: no clubbing or cyanosis Muscle strength: grossly normal all extremities Range of motion: grossly normal all extremities Deformity: none  LYMPHATIC Cervical: none palpable Supraclavicular: none palpable  PSYCHIATRIC Oriented to person, place, and time: yes Mood and affect: normal  for situation Judgment and insight: appropriate for situation    Assessment & Plan   PRIMARY HYPERPARATHYROIDISM (E21.0)  Follow Up - Call [facility] office after tests / studies doneto discuss further plans  Patient is referred by his endocrinologist for surgical evaluation of primary hyperparathyroidism.  Patient provided with a copy of "Parathyroid Surgery: Treatment for Your Parathyroid Gland Problem", published by [employer], 12 pages. Book reviewed and explained to patient during visit today.  Patient has biochemical evidence of primary hyperparathyroidism. We've reviewed his laboratory studies together in detail. We also reviewed the above pamphlet which I provided to him in detail. I have recommended proceeding with diagnostic imaging to include an ultrasound examination of the neck as well as a nuclear medicine parathyroid scan. These 2 studies are usually successful in confirming the diagnosis and localizing the parathyroid adenoma. If they are successful, then he should be a good candidate for minimally invasive outpatient surgery. We discussed the benefits and risk of minimally invasive surgery versus traditional 4 gland exploration. We discussed the potential need for additional diagnostic studies such as a 4D CT scan in order to confirm the diagnosis and identified the adenoma.  We discussed minimally invasive parathyroid surgery. We discussed doing assistant outpatient surgical procedure. We discussed the risk. We discussed the possibility of additional surgery in the event of a second gland adenoma. We discussed restrictions on his activities after surgery and his time out of work. The patient understands and agrees to proceed with further evaluation.  We will need to coordinate this with his vascular surgical procedures. Certainly the parathyroid surgery is not an emergency and we have the of delaying his procedure until it is safe from the standpoint of antiplatelet agents  or anticoagulation.  We will notify the patient when the results of his imaging studies are available for review.  ADDENDUM  Telephone call to patient with results of CT scan dated November 06, 2019. This study fails to identify a convincing candidate for parathyroid adenoma. However, it did note soft and calcified atherosclerotic plaque within the carotid bifurcations bilaterally and in the proximal internal carotid arteries bilaterally. The lesion on the left was  felt to be hemodynamically significant. Carotid artery duplex examination was recommended.  I discussed these findings with the patient. He has recently seen Dr. [name] at [facility] in vascular surgery. Patient has had a recent stent placed in his aortoiliac vessels. We will forward a copy of the CT scan report and a request for evaluation to Dr. Lucky Cowboy.  At this time we will put the parathyroid tissue on hold. I discussed the fact that the patient will likely require a traditional neck exploration since all of his studies have failed to identify the location of a parathyroid adenoma preoperatively. However, I think the vascular surgical issues are more pressing at this time.  ADDENDUM  Telephone call to patient regarding results of USN, Sestamibi, and 4D-CT scan. Inconclusive for location of adenoma, possibly right inferior.  Recommend neck exploration and parathyroidectomy with overnight stay at hospital. The risks and benefits of the procedure have been discussed at length with the patient. The patient understands the proposed procedure, potential alternative treatments, and the course of recovery to be expected. All of the patient's questions have been answered at this time. The patient wishes to proceed with surgery.  Awaiting opening of OR time later this month - on hold due to Covid Pandemic. Priority 2 case. Will submit orders and await availability of OR time. Explained to patient who  understands and will wait to be scheduled.  Armandina Gemma, [facility] Office: [phone]

## 2020-02-08 NOTE — Progress Notes (Signed)
DUE TO COVID-19 ONLY ONE VISITOR IS ALLOWED TO COME WITH YOU AND STAY IN THE WAITING ROOM ONLY DURING PRE OP AND PROCEDURE DAY OF SURGERY. THE 1 VISITOR  MAY VISIT WITH YOU AFTER SURGERY IN YOUR PRIVATE ROOM DURING VISITING HOURS ONLY!  YOU NEED TO HAVE A COVID 19 TEST ON__11/12/2019 _____ @_______ , THIS TEST MUST BE DONE BEFORE SURGERY,  COVID TESTING SITE [address] [place] [address], IT IS ON THE RIGHT GOING OUT [address] APPROXIMATELY  2 MINUTES PAST [employer] ON THE RIGHT. ONCE YOUR COVID TEST IS COMPLETED,  PLEASE BEGIN THE QUARANTINE INSTRUCTIONS AS OUTLINED IN YOUR HANDOUT.                Antonio Russell  02/08/2020   Your procedure is scheduled on: 02/12/2020    Report to [facility] Main  Entrance   Report to admitting at    0830am AM     Call this number if you have problems the morning of surgery [phone]    REMEMBER: NO  SOLID FOOD CANDY OR GUM AFTER MIDNIGHT. CLEAR LIQUIDS UNTIL  0730am       . NOTHING BY MOUTH EXCEPT CLEAR LIQUIDS UNTIL    . PLEASE FINISH ENSURE DRINK PER SURGEON ORDER  WHICH NEEDS TO BE COMPLETED AT  0730am    .      CLEAR LIQUID DIET   Foods Allowed                                                                    Coffee and tea, regular and decaf                            Fruit ices (not with fruit pulp)                                      Iced Popsicles                                    Carbonated beverages, regular and diet                                    Cranberry, grape and apple juices Sports drinks like Gatorade Lightly seasoned clear broth or consume(fat free) Sugar, honey syrup ___________________________________________________________________      BRUSH YOUR TEETH MORNING OF SURGERY AND RINSE YOUR MOUTH OUT, NO CHEWING GUM CANDY OR MINTS.     Take these medicines the morning of surgery with A SIP OF WATER: flonase, toprol, protonix   DO NOT TAKE ANY DIABETIC MEDICATIONS DAY OF YOUR  SURGERY                               You may not have any metal on your body including hair pins and              piercings  Do not  wear jewelry, make-up, lotions, powders or perfumes, deodorant             Do not wear nail polish on your fingernails.  Do not shave  48 hours prior to surgery.              Men may shave face and neck.   Do not bring valuables to the hospital. [place].  Contacts, dentures or bridgework may not be worn into surgery.  Leave suitcase in the car. After surgery it may be brought to your room.     Patients discharged the day of surgery will not be allowed to drive home. IF YOU ARE HAVING SURGERY AND GOING HOME THE SAME DAY, YOU MUST HAVE AN ADULT TO DRIVE YOU HOME AND BE WITH YOU FOR 24 HOURS. YOU MAY GO HOME BY TAXI OR UBER OR ORTHERWISE, BUT AN ADULT MUST ACCOMPANY YOU HOME AND STAY WITH YOU FOR 24 HOURS.  Name and phone number of your driver:  Special Instructions: N/A              Please read over the following fact sheets you were given: _____________________________________________________________________  [facility] - Preparing for Surgery Before surgery, you can play an important role.  Because skin is not sterile, your skin needs to be as free of germs as possible.  You can reduce the number of germs on your skin by washing with CHG (chlorahexidine gluconate) soap before surgery.  CHG is an antiseptic cleaner which kills germs and bonds with the skin to continue killing germs even after washing. Please DO NOT use if you have an allergy to CHG or antibacterial soaps.  If your skin becomes reddened/irritated stop using the CHG and inform your nurse when you arrive at Short Stay. Do not shave (including legs and underarms) for at least 48 hours prior to the first CHG shower.  You may shave your face/neck. Please follow these instructions carefully:  1.  Shower with CHG Soap the night before surgery and the   morning of Surgery.  2.  If you choose to wash your hair, wash your hair first as usual with your  normal  shampoo.  3.  After you shampoo, rinse your hair and body thoroughly to remove the  shampoo.                           4.  Use CHG as you would any other liquid soap.  You can apply chg directly  to the skin and wash                       Gently with a scrungie or clean washcloth.  5.  Apply the CHG Soap to your body ONLY FROM THE NECK DOWN.   Do not use on face/ open                           Wound or open sores. Avoid contact with eyes, ears mouth and genitals (private parts).                       Wash face,  Genitals (private parts) with your normal soap.             6.  Wash thoroughly, paying  special attention to the area where your surgery  will be performed.  7.  Thoroughly rinse your body with warm water from the neck down.  8.  DO NOT shower/wash with your normal soap after using and rinsing off  the CHG Soap.                9.  Pat yourself dry with a clean towel.            10.  Wear clean pajamas.            11.  Place clean sheets on your bed the night of your first shower and do not  sleep with pets. Day of Surgery : Do not apply any lotions/deodorants the morning of surgery.  Please wear clean clothes to the hospital/surgery center.  FAILURE TO FOLLOW THESE INSTRUCTIONS MAY RESULT IN THE CANCELLATION OF YOUR SURGERY PATIENT SIGNATURE_________________________________  NURSE SIGNATURE__________________________________  ________________________________________________________________________

## 2020-02-09 ENCOUNTER — Encounter (HOSPITAL_COMMUNITY): Payer: Self-pay

## 2020-02-09 ENCOUNTER — Encounter (HOSPITAL_COMMUNITY)
Admission: RE | Admit: 2020-02-09 | Discharge: 2020-02-09 | Disposition: A | Discharge status: Home / Self Care | Payer: 59 | Source: Ambulatory Visit | Attending: Surgery | Admitting: Surgery

## 2020-02-09 ENCOUNTER — Other Ambulatory Visit (HOSPITAL_COMMUNITY)
Admission: RE | Admit: 2020-02-09 | Discharge: 2020-02-09 | Disposition: A | Discharge status: Home / Self Care | Payer: 59 | Source: Ambulatory Visit | Attending: Surgery | Admitting: Surgery

## 2020-02-09 ENCOUNTER — Other Ambulatory Visit: Payer: Self-pay

## 2020-02-09 DIAGNOSIS — Z01812 Encounter for preprocedural laboratory examination: Secondary | ICD-10-CM | POA: Insufficient documentation

## 2020-02-09 DIAGNOSIS — Z20822 Contact with and (suspected) exposure to covid-19: Secondary | ICD-10-CM | POA: Diagnosis not present

## 2020-02-09 DIAGNOSIS — Z01818 Encounter for other preprocedural examination: Secondary | ICD-10-CM | POA: Insufficient documentation

## 2020-02-09 HISTORY — DX: Personal history of urinary calculi: Z87.442

## 2020-02-09 HISTORY — DX: Peripheral vascular disease, unspecified: I73.9

## 2020-02-09 LAB — BASIC METABOLIC PANEL
Anion gap: 5 (ref 5–15)
BUN: 17 mg/dL (ref 6–20)
CO2: 24 mmol/L (ref 22–32)
Calcium: 10.7 mg/dL — ABNORMAL HIGH (ref 8.9–10.3)
Chloride: 109 mmol/L (ref 98–111)
Creatinine, Ser: 1.04 mg/dL (ref 0.61–1.24)
GFR, Estimated: >60 mL/min (ref >60)
Glucose, Bld: 103 mg/dL — ABNORMAL HIGH (ref 70–99)
Potassium: 5 mmol/L (ref 3.5–5.1)
Sodium: 138 mmol/L (ref 135–145)

## 2020-02-09 LAB — CBC
HCT: 43.9 % (ref 39.0–52.0)
Hemoglobin: 14.7 g/dL (ref 13.0–17.0)
MCH: 33 pg (ref 26.0–34.0)
MCHC: 33.5 g/dL (ref 30.0–36.0)
MCV: 98.4 fL (ref 80.0–100.0)
Platelets: 233 10*3/uL (ref 150–400)
RBC: 4.46 MIL/uL (ref 4.22–5.81)
RDW: 13.3 % (ref 11.5–15.5)
WBC: 8.8 10*3/uL (ref 4.0–10.5)
nRBC: 0 % (ref 0.0–0.2)

## 2020-02-09 LAB — SARS CORONAVIRUS 2 (TAT 6-24 HRS): SARS Coronavirus 2: NEGATIVE

## 2020-02-09 NOTE — Progress Notes (Signed)
Anesthesia Chart Review   Case: [id number] Date/Time: 02/12/20 1015   Procedure: PARATHYROID EXPLORATION WITH PARATHROIDECTOMY (N/A )   Anesthesia type: General   Pre-op diagnosis: PRIMARY HYPERTHYROIDISM   Location: [place] 01 / [facility]   Surgeons: Armandina Gemma, MD      DISCUSSION:59 y.o. former smoker (8.75 pack years, quit 10/08/19) with h/o HTN, GERD, PVD, sleep apnea, nonobstructive CAD on cath 07/06/2010, s/p AAA stenting 08/2019, primary hyperparathyroidism scheduled for above procedure 02/12/2020 with Dr. Armandina Gemma.   Pt last seen by vascular surgery 12/25/2019.  Per OV note, "He has upcoming parathyroid surgery, and some calcification was seen on the CT scan of his neck by his surgeon.  Prior to proceeding with parathyroidectomy, evaluation for carotid disease was requested.  We performed a carotid duplex today which revealed 1 to 39% bilateral carotid artery stenosis with mild disease.  He would be stable and okay to proceed with his parathyroid surgery.  It should be safe to stop Plavix 5 days prior to his parathyroid surgery."  Anticipate pt can proceed with planned procedure barring acute status change.   VS: BP (!) 160/98   Pulse 77   Temp 36.8 C (Oral)   Resp 18   Ht 6' (1.829 m)   Wt 91.2 kg   SpO2 100%   BMI 27.26 kg/m   PROVIDERS: Tower, Wynelle Fanny, MD is PCP   [name], MD is Vascular Surgeon LABS: Labs reviewed: Acceptable for surgery. (all labs ordered are listed, but only abnormal results are displayed)  Labs Reviewed  BASIC METABOLIC PANEL - Abnormal; Notable for the following components:      Result Value   Glucose, Bld 103 (*)    Calcium 10.7 (*)    All other components within normal limits  CBC     IMAGES: Vascular US 12/22/19 Summary:  Right Carotid: Velocities in the right ICA are consistent with a 1-39%  stenosis.   Left Carotid: Velocities in the left ICA are consistent with a 1-39%  stenosis.   Vertebrals: Bilateral vertebral arteries  demonstrate antegrade flow.  Subclavians: Normal flow hemodynamics were seen in bilateral subclavian        arteries.   EKG: 10/23/19 Rate 76 bpm  NSR  CV:  Past Medical History:  Diagnosis Date  . Allergic rhinitis   . Allergy   . Anxiety   . Arthritis   . Back pain   . Chest pain 2012   cardiac cath - nonobstructive, nl LV fxn, rec aggressive med management  . GERD (gastroesophageal reflux disease)   . Headache(784.0)   . History of kidney stones   . HLD (hyperlipidemia)   . Hyperparathyroidism ([place])   . Hypertension   . Impotence of organic origin   . Other testicular hypofunction   . Peripheral vascular disease ([place])   . Personal history of urinary calculi   . Sleep apnea    no c-pap or bi-pap  . Thyroid disease    Hyperparathyroidism  . Tobacco use disorder   . Unspecified hearing loss     Past Surgical History:  Procedure Laterality Date  . CARDIAC CATHETERIZATION    . COLONOSCOPY    . ENDOVASCULAR REPAIR/STENT GRAFT N/A 08/12/2019   Procedure: ENDOVASCULAR REPAIR/STENT GRAFT;  Surgeon: Algernon Huxley, MD;  Location: [place] [facility];  Service: Cardiovascular;  Laterality: N/A;  . EXTRACORPOREAL SHOCK WAVE LITHOTRIPSY Right 10/29/2019   Procedure: EXTRACORPOREAL SHOCK WAVE LITHOTRIPSY (ESWL);  Surgeon: Billey Co, MD;  Location: [facility] ORS;  Service: Urology;  Laterality: Right;  . HEMORRHOID SURGERY    . HEMORRHOID SURGERY    . KNEE SURGERY Right 2013  . NASAL SEPTUM SURGERY      MEDICATIONS: . cholecalciferol (VITAMIN D3) 25 MCG (1000 UNIT) tablet  . clopidogrel (PLAVIX) 75 MG tablet  . fexofenadine (ALLEGRA) 180 MG tablet  . fluticasone (FLONASE) 50 MCG/ACT nasal spray  . metoprolol succinate (TOPROL-XL) 50 MG 24 hr tablet  . pantoprazole (PROTONIX) 40 MG tablet  . PRALUENT 75 MG/ML SOAJ   No current facility-administered medications for this encounter.    Konrad Felix, PA-C [facility] Pre-Surgical Testing [phone]

## 2020-02-09 NOTE — Progress Notes (Addendum)
Anesthesia Review:  PCP: DR Barbarann Ehlers- LOV 01/05/2020  Cardiologist :  Followed by Vascular Surgery- LOV- 12/25/2019 - clearance in note  DR [name] - endovascular stent graft repair AAA Mild carotid disease  Chest x-ray : EKG : 10/23/2019  Echo : Stress test: Cardiac Cath :  Activity level: can do a flight of stairs without difficulty Sleep Study/ CPAP : sleep apnea no cpap  Fasting Blood Sugar :      / Checks Blood Sugar -- times a day:   Blood Thinner/ Instructions /Last Dose: ASA / Instructions/ Last Dose :  Plavix- pt ahd colonoscopy on 02/01/2020 pt has been off of Plavix since week before colonoscopy  Per patient  approx last dose of Plavix was 01/22/2020  Plavix preop insructions are in 12/25/2019 Dr [name] note  BMP done 02/09/2020 routed via epic to Dr Harlow Asa.

## 2020-02-11 NOTE — Anesthesia Preprocedure Evaluation (Addendum)
Anesthesia Evaluation  Patient identified by MRN, date of birth, ID band Patient awake    Reviewed: Allergy & Precautions, NPO status , Patient's Chart, lab work & pertinent test results  Airway Mallampati: II  TM Distance: >3 FB Neck ROM: Full    Dental no notable dental hx. (+) Teeth Intact, Dental Advisory Given   Pulmonary Patient abstained from smoking., former smoker,    Pulmonary exam normal breath sounds clear to auscultation       Cardiovascular hypertension, Pt. on home beta blockers and Pt. on medications + Peripheral Vascular Disease  Normal cardiovascular exam Rhythm:Regular Rate:Normal     Neuro/Psych  Headaches, Anxiety    GI/Hepatic Neg liver ROS, GERD  Medicated and Controlled,  Endo/Other  negative endocrine ROS  Renal/GU negative Renal ROSK+ 5.0 Cr 1.04     Musculoskeletal  (+) Arthritis ,   Abdominal   Peds  Hematology negative hematology ROS (+) Hgb 14.7   Anesthesia Other Findings   Reproductive/Obstetrics                            Anesthesia Physical Anesthesia Plan  ASA: II  Anesthesia Plan: General   Post-op Pain Management:    Induction: Intravenous  PONV Risk Score and Plan: 3 and Treatment may vary due to age or medical condition, Ondansetron and Midazolam  Airway Management Planned: Oral ETT  Additional Equipment: None  Intra-op Plan:   Post-operative Plan: Extubation in OR  Informed Consent: I have reviewed the patients History and Physical, chart, labs and discussed the procedure including the risks, benefits and alternatives for the proposed anesthesia with the patient or authorized representative who has indicated his/her understanding and acceptance.     Dental advisory given  Plan Discussed with: CRNA and Anesthesiologist  Anesthesia Plan Comments:        Anesthesia Quick Evaluation

## 2020-02-12 ENCOUNTER — Encounter (HOSPITAL_COMMUNITY)
Admission: RE | Disposition: A | Discharge status: Home / Self Care | Payer: Self-pay | Source: Home / Self Care | Attending: Surgery

## 2020-02-12 ENCOUNTER — Other Ambulatory Visit: Payer: Self-pay

## 2020-02-12 ENCOUNTER — Encounter (HOSPITAL_COMMUNITY): Payer: Self-pay | Admitting: Surgery

## 2020-02-12 ENCOUNTER — Ambulatory Visit (HOSPITAL_COMMUNITY): Payer: 59 | Admitting: Physician Assistant

## 2020-02-12 ENCOUNTER — Ambulatory Visit (HOSPITAL_COMMUNITY): Payer: 59 | Admitting: Anesthesiology

## 2020-02-12 ENCOUNTER — Ambulatory Visit (HOSPITAL_COMMUNITY)
Admission: RE | Admit: 2020-02-12 | Discharge: 2020-02-13 | Disposition: A | Discharge status: Home / Self Care | Payer: 59 | Attending: Surgery | Admitting: Surgery

## 2020-02-12 DIAGNOSIS — Z23 Encounter for immunization: Secondary | ICD-10-CM | POA: Diagnosis not present

## 2020-02-12 DIAGNOSIS — Z79899 Other long term (current) drug therapy: Secondary | ICD-10-CM | POA: Insufficient documentation

## 2020-02-12 DIAGNOSIS — E210 Primary hyperparathyroidism: Secondary | ICD-10-CM | POA: Insufficient documentation

## 2020-02-12 HISTORY — PX: PARATHYROID EXPLORATION: SHX732

## 2020-02-12 SURGERY — EXPLORATION, PARATHYROID
Anesthesia: General | Site: Neck

## 2020-02-12 MED ORDER — CHLORHEXIDINE GLUCONATE CLOTH 2 % EX PADS: 6.0000 | MEDICATED_PAD | Freq: Once | CUTANEOUS | Status: DC

## 2020-02-12 MED ORDER — ROCURONIUM BROMIDE 10 MG/ML (PF) SYRINGE
PREFILLED_SYRINGE | INTRAVENOUS | Status: DC | PRN
  Administered 2020-02-12: 10 mg via INTRAVENOUS
  Administered 2020-02-12: 60 mg via INTRAVENOUS
  Administered 2020-02-12 (×2): 10 mg via INTRAVENOUS

## 2020-02-12 MED ORDER — LABETALOL HCL 5 MG/ML IV SOLN
5.0000 mg | INTRAVENOUS | Status: AC | PRN
  Administered 2020-02-12 (×2): 5 mg via INTRAVENOUS

## 2020-02-12 MED ORDER — KETOROLAC TROMETHAMINE 30 MG/ML IJ SOLN
INTRAMUSCULAR | Status: AC
  Filled 2020-02-12: qty 1

## 2020-02-12 MED ORDER — HYDROMORPHONE HCL 1 MG/ML IJ SOLN
INTRAMUSCULAR | Status: AC
  Filled 2020-02-12: qty 1

## 2020-02-12 MED ORDER — MIDAZOLAM HCL 5 MG/5ML IJ SOLN
INTRAMUSCULAR | Status: DC | PRN
  Administered 2020-02-12: 2 mg via INTRAVENOUS

## 2020-02-12 MED ORDER — EPHEDRINE SULFATE-NACL 50-0.9 MG/10ML-% IV SOSY
PREFILLED_SYRINGE | INTRAVENOUS | Status: DC | PRN
  Administered 2020-02-12: 5 mg via INTRAVENOUS

## 2020-02-12 MED ORDER — ACETAMINOPHEN 325 MG PO TABS: 650.0000 mg | ORAL_TABLET | Freq: Four times a day (QID) | ORAL | Status: DC | PRN

## 2020-02-12 MED ORDER — CEFAZOLIN SODIUM-DEXTROSE 2-4 GM/100ML-% IV SOLN
INTRAVENOUS | Status: AC
  Filled 2020-02-12: qty 100

## 2020-02-12 MED ORDER — INFLUENZA VAC SPLIT QUAD 0.5 ML IM SUSY
0.5000 mL | PREFILLED_SYRINGE | INTRAMUSCULAR | Status: AC
  Administered 2020-02-13: 10:00:00 0.5 mL via INTRAMUSCULAR
  Filled 2020-02-12: qty 0.5

## 2020-02-12 MED ORDER — FENTANYL CITRATE (PF) 250 MCG/5ML IJ SOLN
INTRAMUSCULAR | Status: AC
  Filled 2020-02-12: qty 5

## 2020-02-12 MED ORDER — ONDANSETRON 4 MG PO TBDP: 4.0000 mg | ORAL_TABLET | Freq: Four times a day (QID) | ORAL | Status: DC | PRN

## 2020-02-12 MED ORDER — SODIUM CHLORIDE 0.45 % IV SOLN: INTRAVENOUS | Status: DC

## 2020-02-12 MED ORDER — ONDANSETRON HCL 4 MG/2ML IJ SOLN
INTRAMUSCULAR | Status: DC | PRN
  Administered 2020-02-12: 4 mg via INTRAVENOUS

## 2020-02-12 MED ORDER — CHLORHEXIDINE GLUCONATE 0.12 % MT SOLN
15.0000 mL | Freq: Once | OROMUCOSAL | Status: AC
  Administered 2020-02-12: 15 mL via OROMUCOSAL

## 2020-02-12 MED ORDER — LIDOCAINE 2% (20 MG/ML) 5 ML SYRINGE
INTRAMUSCULAR | Status: DC | PRN
  Administered 2020-02-12: 100 mg via INTRAVENOUS

## 2020-02-12 MED ORDER — CEFAZOLIN SODIUM-DEXTROSE 2-4 GM/100ML-% IV SOLN
2.0000 g | INTRAVENOUS | Status: AC
  Administered 2020-02-12: 2 g via INTRAVENOUS

## 2020-02-12 MED ORDER — ONDANSETRON HCL 4 MG/2ML IJ SOLN: 4.0000 mg | Freq: Four times a day (QID) | INTRAMUSCULAR | Status: DC | PRN

## 2020-02-12 MED ORDER — DEXAMETHASONE SODIUM PHOSPHATE 10 MG/ML IJ SOLN
INTRAMUSCULAR | Status: DC | PRN
  Administered 2020-02-12: 6 mg via INTRAVENOUS

## 2020-02-12 MED ORDER — LABETALOL HCL 5 MG/ML IV SOLN
INTRAVENOUS | Status: AC
  Filled 2020-02-12: qty 4

## 2020-02-12 MED ORDER — ROCURONIUM BROMIDE 10 MG/ML (PF) SYRINGE
PREFILLED_SYRINGE | INTRAVENOUS | Status: AC
  Filled 2020-02-12: qty 10

## 2020-02-12 MED ORDER — OXYCODONE HCL 5 MG PO TABS: 5.0000 mg | ORAL_TABLET | Freq: Once | ORAL | Status: DC | PRN

## 2020-02-12 MED ORDER — PANTOPRAZOLE SODIUM 40 MG PO TBEC
40.0000 mg | DELAYED_RELEASE_TABLET | Freq: Every day | ORAL | Status: DC
  Administered 2020-02-12 – 2020-02-13 (×2): 40 mg via ORAL
  Filled 2020-02-12 (×2): qty 1

## 2020-02-12 MED ORDER — DEXMEDETOMIDINE (PRECEDEX) IN NS 20 MCG/5ML (4 MCG/ML) IV SYRINGE
PREFILLED_SYRINGE | INTRAVENOUS | Status: DC | PRN
  Administered 2020-02-12: 8 ug via INTRAVENOUS

## 2020-02-12 MED ORDER — HYDROMORPHONE HCL 1 MG/ML IJ SOLN
0.2500 mg | INTRAMUSCULAR | Status: DC | PRN
  Administered 2020-02-12 (×3): 0.5 mg via INTRAVENOUS

## 2020-02-12 MED ORDER — ACETAMINOPHEN 650 MG RE SUPP: 650.0000 mg | Freq: Four times a day (QID) | RECTAL | Status: DC | PRN

## 2020-02-12 MED ORDER — KETOROLAC TROMETHAMINE 30 MG/ML IJ SOLN
30.0000 mg | Freq: Once | INTRAMUSCULAR | Status: AC | PRN
  Administered 2020-02-12: 30 mg via INTRAVENOUS

## 2020-02-12 MED ORDER — LIDOCAINE 2% (20 MG/ML) 5 ML SYRINGE
INTRAMUSCULAR | Status: AC
  Filled 2020-02-12: qty 5

## 2020-02-12 MED ORDER — 0.9 % SODIUM CHLORIDE (POUR BTL) OPTIME
TOPICAL | Status: DC | PRN
  Administered 2020-02-12: 1000 mL

## 2020-02-12 MED ORDER — ORAL CARE MOUTH RINSE: 15.0000 mL | Freq: Once | OROMUCOSAL | Status: AC

## 2020-02-12 MED ORDER — FLUTICASONE PROPIONATE 50 MCG/ACT NA SUSP
2.0000 | Freq: Every day | NASAL | Status: DC
  Administered 2020-02-13: 10:00:00 2 via NASAL
  Filled 2020-02-12: qty 16

## 2020-02-12 MED ORDER — HYDROMORPHONE HCL 1 MG/ML IJ SOLN
1.0000 mg | INTRAMUSCULAR | Status: DC | PRN
  Administered 2020-02-12: 15:00:00 1 mg via INTRAVENOUS
  Filled 2020-02-12: qty 1

## 2020-02-12 MED ORDER — LACTATED RINGERS IV SOLN: INTRAVENOUS | Status: DC

## 2020-02-12 MED ORDER — HYDROMORPHONE HCL 1 MG/ML IJ SOLN
INTRAMUSCULAR | Status: AC
  Administered 2020-02-12: 0.5 mg via INTRAVENOUS
  Filled 2020-02-12: qty 1

## 2020-02-12 MED ORDER — MIDAZOLAM HCL 2 MG/2ML IJ SOLN
INTRAMUSCULAR | Status: AC
  Filled 2020-02-12: qty 2

## 2020-02-12 MED ORDER — DEXMEDETOMIDINE (PRECEDEX) IN NS 20 MCG/5ML (4 MCG/ML) IV SYRINGE
PREFILLED_SYRINGE | INTRAVENOUS | Status: AC
  Filled 2020-02-12: qty 5

## 2020-02-12 MED ORDER — METOPROLOL SUCCINATE ER 50 MG PO TB24
50.0000 mg | ORAL_TABLET | Freq: Every day | ORAL | Status: DC
  Administered 2020-02-12: 21:00:00 50 mg via ORAL
  Filled 2020-02-12: qty 1

## 2020-02-12 MED ORDER — ONDANSETRON HCL 4 MG/2ML IJ SOLN
INTRAMUSCULAR | Status: AC
  Filled 2020-02-12: qty 2

## 2020-02-12 MED ORDER — OXYCODONE HCL 5 MG/5ML PO SOLN: 5.0000 mg | Freq: Once | ORAL | Status: DC | PRN

## 2020-02-12 MED ORDER — FENTANYL CITRATE (PF) 100 MCG/2ML IJ SOLN
INTRAMUSCULAR | Status: DC | PRN
  Administered 2020-02-12 (×3): 50 ug via INTRAVENOUS
  Administered 2020-02-12: 100 ug via INTRAVENOUS

## 2020-02-12 MED ORDER — EPHEDRINE 5 MG/ML INJ
INTRAVENOUS | Status: AC
  Filled 2020-02-12: qty 10

## 2020-02-12 MED ORDER — OXYCODONE HCL 5 MG PO TABS
5.0000 mg | ORAL_TABLET | ORAL | Status: DC | PRN
  Administered 2020-02-12 – 2020-02-13 (×4): 10 mg via ORAL
  Filled 2020-02-12 (×4): qty 2

## 2020-02-12 MED ORDER — ONDANSETRON HCL 4 MG/2ML IJ SOLN: 4.0000 mg | Freq: Once | INTRAMUSCULAR | Status: DC | PRN

## 2020-02-12 MED ORDER — PROPOFOL 10 MG/ML IV BOLUS
INTRAVENOUS | Status: AC
  Filled 2020-02-12: qty 20

## 2020-02-12 MED ORDER — PROPOFOL 10 MG/ML IV BOLUS
INTRAVENOUS | Status: DC | PRN
  Administered 2020-02-12: 200 mg via INTRAVENOUS

## 2020-02-12 MED ORDER — TRAMADOL HCL 50 MG PO TABS: 50.0000 mg | ORAL_TABLET | Freq: Four times a day (QID) | ORAL | Status: DC | PRN

## 2020-02-12 SURGICAL SUPPLY — 35 items
ADH SKN CLS APL DERMABOND .7 (GAUZE/BANDAGES/DRESSINGS) ×1
APL PRP STRL LF DISP 70% ISPRP (MISCELLANEOUS) ×1
ATTRACTOMAT 16X20 MAGNETIC DRP (DRAPES) ×3 IMPLANT
BLADE SURG 15 STRL LF DISP TIS (BLADE) ×1 IMPLANT
BLADE SURG 15 STRL SS (BLADE) ×3
CHLORAPREP W/TINT 26 (MISCELLANEOUS) ×3 IMPLANT
CLIP VESOCCLUDE MED 6/CT (CLIP) ×6 IMPLANT
CLIP VESOCCLUDE SM WIDE 6/CT (CLIP) ×6 IMPLANT
COVER SURGICAL LIGHT HANDLE (MISCELLANEOUS) ×3 IMPLANT
COVER WAND RF STERILE (DRAPES) ×3 IMPLANT
DERMABOND ADVANCED (GAUZE/BANDAGES/DRESSINGS) ×2
DERMABOND ADVANCED .7 DNX12 (GAUZE/BANDAGES/DRESSINGS) ×1 IMPLANT
DRAPE LAPAROTOMY T 98X78 PEDS (DRAPES) ×3 IMPLANT
ELECT REM PT RETURN 15FT ADLT (MISCELLANEOUS) ×3 IMPLANT
GAUZE 4X4 16PLY RFD (DISPOSABLE) ×3 IMPLANT
GLOVE SURG ORTHO 8.0 STRL STRW (GLOVE) ×3 IMPLANT
GOWN STRL REUS W/TWL XL LVL3 (GOWN DISPOSABLE) ×9 IMPLANT
HEMOSTAT SURGICEL 2X4 FIBR (HEMOSTASIS) ×3 IMPLANT
ILLUMINATOR WAVEGUIDE N/F (MISCELLANEOUS) IMPLANT
KIT BASIN OR (CUSTOM PROCEDURE TRAY) ×3 IMPLANT
KIT TURNOVER KIT A (KITS) IMPLANT
LIGASURE 7.4 SM JAW OPEN (ELECTROSURGICAL) IMPLANT
NDL HYPO 25X1 1.5 SAFETY (NEEDLE) ×1 IMPLANT
NEEDLE HYPO 25X1 1.5 SAFETY (NEEDLE) ×3 IMPLANT
PACK BASIC VI WITH GOWN DISP (CUSTOM PROCEDURE TRAY) ×3 IMPLANT
PENCIL SMOKE EVACUATOR (MISCELLANEOUS) ×3 IMPLANT
SHEARS HARMONIC 9CM CVD (BLADE) ×2 IMPLANT
SUT MNCRL AB 4-0 PS2 18 (SUTURE) ×3 IMPLANT
SUT VIC AB 3-0 SH 18 (SUTURE) ×5 IMPLANT
SYR BULB IRRIG 60ML STRL (SYRINGE) ×3 IMPLANT
SYR CONTROL 10ML LL (SYRINGE) ×3 IMPLANT
TOWEL OR 17X26 10 PK STRL BLUE (TOWEL DISPOSABLE) ×3 IMPLANT
TOWEL OR NON WOVEN STRL DISP B (DISPOSABLE) ×3 IMPLANT
TUBING CONNECTING 10 (TUBING) ×2 IMPLANT
TUBING CONNECTING 10' (TUBING) ×1

## 2020-02-12 NOTE — Anesthesia Postprocedure Evaluation (Signed)
Anesthesia Post Note  Patient: Antonio Russell  Procedure(s) Performed: PARATHYROID EXPLORATION WITH PARATHROIDECTOMY (N/A Neck)     Patient location during evaluation: PACU Anesthesia Type: General Level of consciousness: awake and alert Pain management: pain level controlled Vital Signs Assessment: post-procedure vital signs reviewed and stable Respiratory status: spontaneous breathing, nonlabored ventilation, respiratory function stable and patient connected to nasal cannula oxygen Cardiovascular status: blood pressure returned to baseline and stable Postop Assessment: no apparent nausea or vomiting Anesthetic complications: no   No complications documented.  Last Vitals:  Vitals:   02/12/20 1429 02/12/20 1525  BP: (!) 160/98 128/84  Pulse: 82 87  Resp: 18 15  Temp: 36.7 C (!) 36.3 C  SpO2: 98% 94%    Last Pain:  Vitals:   02/12/20 1525  TempSrc: Oral  PainSc:                  [place]et [place]

## 2020-02-12 NOTE — Op Note (Signed)
Operative Note  Pre-operative Diagnosis:  Primary hyperparathyroidism  Post-operative Diagnosis:  same  Surgeon:  Armandina Gemma, MD  Assistant:  Gurney Maxin, MD   Procedure:  Neck exploration with right inferior parathyroidectomy  Anesthesia:  general  Estimated Blood Loss:  minimal  Drains: none         Specimen: parathyroid to pathology  Indications:  Patient is referred by Dr. Philemon Kingdom for surgical evaluation and management of primary hyperparathyroidism. Patient had been noted for several years to have elevated serum calcium levels. Laboratory studies today demonstrate calcium ranging from 11.0-11.9. Patient underwent evaluation demonstrating intact PTH levels ranging from 60-137. Vitamin D level was normal to mildly elevated. 24-hour urine collection for calcium was performed and was elevated at 401.  Imaging studies were performed.  Ultrasound failed to identify any evidence of parathyroid adenoma.  Nuclear medicine parathyroid scan suggested the presence of a right inferior parathyroid adenoma.  4D CT scan of the neck failed to identify or confirm the presence of an adenoma.  Patient now comes to surgery for neck exploration and parathyroidectomy.  Procedure:  The patient was seen in the pre-op holding area. The risks, benefits, complications, treatment options, and expected outcomes were previously discussed with the patient. The patient agreed with the proposed plan and has signed the informed consent form.  The patient was brought to the operating room by the surgical team, identified as [name] and the procedure verified. A "time out" was completed and the above information confirmed.  Following induction of general anesthesia, the patient is positioned and then prepped and draped in the usual aseptic fashion.  After ascertaining that an adequate level of anesthesia been achieved, a Kocher incision is made with a #15 blade.  Dissection was carried through  subcutaneous tissues and platysma.  Subplatysmal flaps are elevated cephalad and caudad from the thyroid notch to the sternal notch.  A Mahorner self-retaining retractors placed for exposure.  Strap muscles are incised in the midline.  Strap muscles are reflected to the left exposing the left thyroid lobe.  Left lobe is moderately firm and slightly enlarged without discrete or dominant mass.  Lobe is mobilized.  Dissection is carried posteriorly to the precervical fascia.  Superior pole was dissected out.  The dissection is carried posterior to the hypopharynx.  No enlarged parathyroid tissue was seen in the superior position.  Further dissection at the inferior pole of the thyroid reveals a normal parathyroid gland.  Dry pack is placed in the left neck.  Next we turned our attention to the right side of the neck.  Again strap muscles are reflected laterally.  Right thyroid lobe is moderately firm without discrete or dominant mass.  Superior pole was dissected out and explored back to the precervical fascia and the space behind the pharynx.  Again no sign of enlarged parathyroid tissue was identified.  Exploring the inferior pole of the thyroid reveals what appears to be an enlarged parathyroid gland sitting in the groove between the posterior aspect of the thyroid lobe and the underlying trachea.  This is just below the inferior thyroid artery and just above the recurrent nerve.  Gland is gently dissected out.  Vascular structures are divided between small ligaclips with the harmonic scalpel.  Care is taken to avoid the recurrent nerve which is just beneath the gland.  The entire parathyroid gland is excised and submitted to pathology for frozen section which confirms hypercellular parathyroid tissue consistent with parathyroid adenoma.  Neck is inspected  bilaterally for hemostasis.  Neck is irrigated with warm saline.  Fibrillar is placed throughout the operative field.  Strap muscles are reapproximated in  the midline with interrupted 3-0 Vicryl sutures.  Platysma was closed with interrupted 3-0 Vicryl sutures.  Skin is closed with a running 4-0 Monocryl subcuticular suture.  Wound is washed and dried and Dermabond is applied as dressing.  Patient is awakened from anesthesia and transported to the recovery room in stable condition.  The patient tolerated the procedure well.   Armandina Gemma, MD [facility] Office: [phone]

## 2020-02-12 NOTE — Anesthesia Procedure Notes (Signed)
Procedure Name: Intubation Date/Time: 02/12/2020 9:58 AM Performed by: Victoriano Lain, CRNA Pre-anesthesia Checklist: Patient identified, Emergency Drugs available, Suction available, Patient being monitored and Timeout performed Patient Re-evaluated:Patient Re-evaluated prior to induction Oxygen Delivery Method: Circle system utilized Preoxygenation: Pre-oxygenation with 100% oxygen Induction Type: IV induction Ventilation: Mask ventilation without difficulty Laryngoscope Size: Mac and 4 Grade View: Grade I Tube type: Oral Tube size: 7.5 mm Number of attempts: 1 Airway Equipment and Method: Stylet Placement Confirmation: ETT inserted through vocal cords under direct vision,  positive ETCO2 and breath sounds checked- equal and bilateral Secured at: 21 cm Tube secured with: Tape Dental Injury: Teeth and Oropharynx as per pre-operative assessment

## 2020-02-12 NOTE — Interval H&P Note (Signed)
History and Physical Interval Note:  02/12/2020 9:49 AM  Antonio Russell  has presented today for surgery, with the diagnosis of PRIMARY HYPERTHYROIDISM.  The various methods of treatment have been discussed with the patient and family. After consideration of risks, benefits and other options for treatment, the patient has consented to    Procedure(s): PARATHYROID EXPLORATION WITH PARATHROIDECTOMY (N/A) as a surgical intervention.    The patient's history has been reviewed, patient examined, no change in status, stable for surgery.  I have reviewed the patient's chart and labs.  Questions were answered to the patient's satisfaction.    Armandina Gemma, MD [facility] Office: [place]

## 2020-02-12 NOTE — Transfer of Care (Signed)
Immediate Anesthesia Transfer of Care Note  Patient: Antonio Russell  Procedure(s) Performed: PARATHYROID EXPLORATION WITH PARATHROIDECTOMY (N/A Neck)  Patient Location: PACU  Anesthesia Type:General  Level of Consciousness: awake, alert , oriented and patient cooperative  Airway & Oxygen Therapy: PT spontaneous breathing and on FM  Post-op Assessment: Report given to RN, Post -op Vital signs reviewed and stable and Patient moving all extremities  Post vital signs: Reviewed and stable  Last Vitals:  Vitals Value Taken Time  BP    Temp    Pulse 92 02/12/20 1157  Resp 14 02/12/20 1157  SpO2 100 % 02/12/20 1157  Vitals shown include unvalidated device data.  Last Pain:  Vitals:   02/12/20 0909  TempSrc:   PainSc: 0-No pain         Complications: No complications documented.

## 2020-02-13 ENCOUNTER — Encounter (HOSPITAL_COMMUNITY): Payer: Self-pay | Admitting: Surgery

## 2020-02-13 DIAGNOSIS — E210 Primary hyperparathyroidism: Secondary | ICD-10-CM | POA: Diagnosis not present

## 2020-02-13 LAB — BASIC METABOLIC PANEL
Anion gap: 7 (ref 5–15)
BUN: 24 mg/dL — ABNORMAL HIGH (ref 6–20)
CO2: 23 mmol/L (ref 22–32)
Calcium: 9.5 mg/dL (ref 8.9–10.3)
Chloride: 104 mmol/L (ref 98–111)
Creatinine, Ser: 1.16 mg/dL (ref 0.61–1.24)
GFR, Estimated: >60 mL/min (ref >60)
Glucose, Bld: 146 mg/dL — ABNORMAL HIGH (ref 70–99)
Potassium: 4.4 mmol/L (ref 3.5–5.1)
Sodium: 134 mmol/L — ABNORMAL LOW (ref 135–145)

## 2020-02-13 MED ORDER — OXYCODONE HCL 5 MG PO TABS
5.0000 mg | ORAL_TABLET | Freq: Four times a day (QID) | ORAL | 0 refills | Status: DC | PRN
Start: 2020-02-13 — End: 2020-04-25

## 2020-02-13 NOTE — Discharge Summary (Signed)
Physician Discharge Summary [facility]  Patient ID: Antonio Russell MRN: [id number] DOB/AGE: [birth year] 59 y.o.  Admit date: 02/12/2020 Discharge date: 02/13/2020  Admission Diagnoses:  Primary hyperparathyroidism  Discharge Diagnoses:  Principal Problem:   Primary hyperparathyroidism [facility])   Discharged Condition: good  Hospital Course: Patient was admitted for observation following parathyroid surgery.  Post op course was uncomplicated.  Pain was well controlled.  Tolerated diet.  Post op calcium level on morning following surgery was 9.5 mg/dl.  Patient was prepared for discharge home on POD#1.  Consults: None  Treatments: surgery: neck exploration with parathyroidectomy  Discharge Exam: Blood pressure 131/88, pulse 73, temperature (!) 97.4 F (36.3 C), temperature source Oral, resp. rate 16, height 6' (1.829 m), weight 91.2 kg, SpO2 99 %. HEENT - clear Neck - wound dry and intact; mild STS; voice slightly hoarse Chest - clear bilaterally Cor - RRR   Disposition: Home  Discharge Instructions    Diet - low sodium heart healthy   Complete by: As directed    Discharge instructions   Complete by: As directed    [place]  THYROID & PARATHYROID SURGERY:  POST-OP INSTRUCTIONS  Always review your discharge instruction sheet from the facility where your surgery was performed.  A prescription for pain medication may be given to you upon discharge.  Take your pain medication as prescribed.  If narcotic pain medicine is not needed, then you may take acetaminophen (Tylenol) or ibuprofen (Advil) as needed.  Take your usually prescribed medications unless otherwise directed.  If you need a refill on your pain medication, please contact our office during regular business hours.  Prescriptions cannot be processed by our office after 5 pm or on weekends.  Start with a light diet upon arrival home, such as soup and crackers or toast.  Be  sure to drink plenty of fluids daily.  Resume your normal diet the day after surgery.  Most patients will experience some swelling and bruising on the chest and neck area.  Ice packs will help.  Swelling and bruising can take several days to resolve.   It is common to experience some constipation after surgery.  Increasing fluid intake and taking a stool softener (Colace) will usually help or prevent this problem.  A mild laxative (Milk of Magnesia or Miralax) should be taken according to package directions if there has been no bowel movement after 48 hours.  You have steri-strips and a gauze dressing over your incision.  You may remove the gauze bandage on the second day after surgery, and you may shower at that time.  Leave your steri-strips (small skin tapes) in place directly over the incision.  These strips should remain on the skin for 5-7 days and then be removed.  You may get them wet in the shower and pat them dry.  You may resume regular (light) daily activities beginning the next day (such as daily self-care, walking, climbing stairs) gradually increasing activities as tolerated.  You may have sexual intercourse when it is comfortable.  Refrain from any heavy lifting or straining until approved by your doctor.  You may drive when you no longer are taking prescription pain medication, you can comfortably wear a seatbelt, and you can safely maneuver your car and apply brakes.  You should see your doctor in the office for a follow-up appointment approximately three weeks after your surgery.  Make sure that you call for this appointment within a day or  two after you arrive home to insure a convenient appointment time.  WHEN TO CALL YOUR DOCTOR: -- Fever greater than 101.5 -- Inability to urinate -- Nausea and/or vomiting - persistent -- Extreme swelling or bruising -- Continued bleeding from incision -- Increased pain, redness, or drainage from the incision -- Difficulty swallowing or  breathing -- Muscle cramping or spasms -- Numbness or tingling in hands or around lips  The clinic staff is available to answer your questions during regular business hours.  Please don't hesitate to call and ask to speak to one of the nurses if you have concerns.  Armandina Gemma, MD [facility] Office: [phone]   Increase activity slowly   Complete by: As directed    No dressing needed   Complete by: As directed      Allergies as of 02/13/2020      Reactions   Sertraline Hcl Other (See Comments)   Tremors   Amlodipine Other (See Comments)   Sexual side effects   Crestor [rosuvastatin Calcium] Other (See Comments)   Myalgia   Lipitor [atorvastatin Calcium] Other (See Comments)   Myalgia   Zocor [simvastatin - High Dose] Other (See Comments)   Myalgia      Medication List    TAKE these medications   cholecalciferol 25 MCG (1000 UNIT) tablet Commonly known as: VITAMIN D3 Take 1,000 Units by mouth daily.   clopidogrel 75 MG tablet Commonly known as: PLAVIX Take 1 tablet (75 mg total) by mouth daily at 6 (six) AM.   fexofenadine 180 MG tablet Commonly known as: ALLEGRA Take 180 mg by mouth daily.   fluticasone 50 MCG/ACT nasal spray Commonly known as: FLONASE SPRAY 2 SPRAYS INTO EACH NOSTRIL EVERY DAY What changed: See the new instructions.   metoprolol succinate 50 MG 24 hr tablet Commonly known as: TOPROL-XL Take 1 tablet (50 mg total) by mouth daily. Take with or immediately following a meal. What changed: additional instructions   oxyCODONE 5 MG immediate release tablet Commonly known as: Oxy IR/ROXICODONE Take 1-2 tablets (5-10 mg total) by mouth every 6 (six) hours as needed for moderate pain.   pantoprazole 40 MG tablet Commonly known as: PROTONIX Take 1 tablet (40 mg total) by mouth daily.   Praluent 75 MG/ML Soaj Generic drug: Alirocumab Inject 75 mg into the skin every 14 (fourteen) days.            Discharge Care  Instructions  (From admission, onward)         Start     Ordered   02/13/20 0000  No dressing needed        02/13/20 0623          Follow-up Information    Armandina Gemma, MD. Schedule an appointment as soon as possible for a visit in 3 week(s).   Specialty: General Surgery Contact information: [address] [place] [address] [phone]               Earnstine Regal, MD, [facility] Office: [phone]   Signed: Armandina Gemma 02/13/2020, 7:57 AM

## 2020-02-13 NOTE — Progress Notes (Signed)
Assessment unchanged. No distress. Wife at bedside. Both, pt and wife, verbalized understanding of dc instructions through teach back including medications to resume and follow up care. Discharged via wc to front entrance accompanied by NT and wife.

## 2020-02-15 LAB — SURGICAL PATHOLOGY

## 2020-02-16 ENCOUNTER — Encounter: Payer: Self-pay | Admitting: Gastroenterology

## 2020-02-24 ENCOUNTER — Other Ambulatory Visit (INDEPENDENT_AMBULATORY_CARE_PROVIDER_SITE_OTHER): Payer: Self-pay | Admitting: Vascular Surgery

## 2020-03-10 ENCOUNTER — Other Ambulatory Visit (INDEPENDENT_AMBULATORY_CARE_PROVIDER_SITE_OTHER): Payer: 59

## 2020-03-10 ENCOUNTER — Ambulatory Visit (INDEPENDENT_AMBULATORY_CARE_PROVIDER_SITE_OTHER): Payer: 59 | Admitting: Nurse Practitioner

## 2020-03-10 ENCOUNTER — Encounter (INDEPENDENT_AMBULATORY_CARE_PROVIDER_SITE_OTHER): Payer: 59

## 2020-04-15 ENCOUNTER — Other Ambulatory Visit: Payer: Self-pay | Admitting: *Deleted

## 2020-04-15 MED ORDER — METOPROLOL SUCCINATE ER 50 MG PO TB24: 50.0000 mg | ORAL_TABLET | Freq: Every day | ORAL | 0 refills | Status: DC

## 2020-04-20 ENCOUNTER — Other Ambulatory Visit: Payer: Self-pay | Admitting: *Deleted

## 2020-04-20 MED ORDER — PRALUENT 75 MG/ML ~~LOC~~ SOAJ: 75.0000 mg | SUBCUTANEOUS | 2 refills | Status: DC

## 2020-04-25 ENCOUNTER — Encounter: Payer: Self-pay | Admitting: Cardiology

## 2020-04-25 ENCOUNTER — Ambulatory Visit (INDEPENDENT_AMBULATORY_CARE_PROVIDER_SITE_OTHER): Payer: 59 | Admitting: Cardiology

## 2020-04-25 ENCOUNTER — Other Ambulatory Visit: Payer: Self-pay

## 2020-04-25 VITALS — BP 146/100 | HR 69 | Ht 72.0 in | Wt 205.0 lb

## 2020-04-25 DIAGNOSIS — I10 Essential (primary) hypertension: Secondary | ICD-10-CM

## 2020-04-25 DIAGNOSIS — R072 Precordial pain: Secondary | ICD-10-CM | POA: Diagnosis not present

## 2020-04-25 DIAGNOSIS — I739 Peripheral vascular disease, unspecified: Secondary | ICD-10-CM | POA: Diagnosis not present

## 2020-04-25 DIAGNOSIS — E7800 Pure hypercholesterolemia, unspecified: Secondary | ICD-10-CM | POA: Diagnosis not present

## 2020-04-25 MED ORDER — LOSARTAN POTASSIUM 50 MG PO TABS: 50.0000 mg | ORAL_TABLET | Freq: Every day | ORAL | 5 refills | Status: DC

## 2020-04-25 NOTE — Patient Instructions (Signed)
Medication Instructions:   Your physician has recommended you make the following change in your medication:   1.  STOP taking your Toprol XL (Metoprolol Succinate).  2.  START taking Losartan (Cozaar) 50 MG: Take 1 tablet by mouth daily.  *If you need a refill on your cardiac medications before your next appointment, please call your pharmacy*   Lab Work:  None Ordered   Testing/Procedures:  Your physician has requested that you have an echocardiogram. Echocardiography is a painless test that uses sound waves to create images of your heart. It provides your doctor with information about the size and shape of your heart and how well your heart's chambers and valves are working. This procedure takes approximately one hour. There are no restrictions for this procedure.    Follow-Up: At [facility], you and your health needs are our priority.  As part of our continuing mission to provide you with exceptional heart care, we have created designated Provider Care Teams.  These Care Teams include your primary Cardiologist (physician) and Advanced Practice Providers (APPs -  Physician Assistants and Nurse Practitioners) who all work together to provide you with the care you need, when you need it.  We recommend signing up for the patient portal called "MyChart".  Sign up information is provided on this After Visit Summary.  MyChart is used to connect with patients for Virtual Visits (Telemedicine).  Patients are able to view lab/test results, encounter notes, upcoming appointments, etc.  Non-urgent messages can be sent to your provider as well.   To learn more about what you can do with MyChart, go to [email or web address].    Your next appointment:   Follow up after Echo   The format for your next appointment:   In Person  Provider:   Kate Sable, MD   Other Instructions

## 2020-04-25 NOTE — Progress Notes (Signed)
Cardiology Office Note:    Date:  04/25/2020   ID:  Antonio Russell, DOB 1960/05/01, MRN [id number]  PCP:  Abner Greenspan, MD  Cardiologist:  Kate Sable, MD  Electrophysiologist:  None   Referring MD: Abner Greenspan, MD   Chief Complaint  Patient presents with  . Follow-up    6 month F/U-No new cardiac concerns    History of Present Illness:    Antonio Russell is a 60 y.o. male with a hx of hyperlipidemia, hypertension current smoker x40+ years, AAA s/p endoprosthetic repair 08/2019,  peripheral arterial disease s/p R and L iliac stents, sleep apnea who presents for follow-up.    Patient being seen for hypertension, hyperlipidemia.  Patient parathyroid surgery about 2 months ago.  Since that he has noticed some mild chest pressure not associated with exertion.  He thinks this might be secondary to his surgery.  He has also noticed anorgasmia/problems with erection over the past 3 months.  His blood pressures at home seem controlled usually in the [id number] systolic.    Past Medical History:  Diagnosis Date  . Allergic rhinitis   . Allergy   . Anxiety   . Arthritis   . Back pain   . Chest pain 2012   cardiac cath - nonobstructive, nl LV fxn, rec aggressive med management  . GERD (gastroesophageal reflux disease)   . Headache(784.0)   . History of kidney stones   . HLD (hyperlipidemia)   . Hyperparathyroidism ([place])   . Hypertension   . Impotence of organic origin   . Other testicular hypofunction   . Peripheral vascular disease ([place])   . Personal history of urinary calculi   . Sleep apnea    no c-pap or bi-pap  . Thyroid disease    Hyperparathyroidism  . Tobacco use disorder   . Unspecified hearing loss     Past Surgical History:  Procedure Laterality Date  . CARDIAC CATHETERIZATION    . COLONOSCOPY    . ENDOVASCULAR REPAIR/STENT GRAFT N/A 08/12/2019   Procedure: ENDOVASCULAR REPAIR/STENT GRAFT;  Surgeon: Algernon Huxley, MD;  Location: [facility] [place] [facility];   Service: Cardiovascular;  Laterality: N/A;  . EXTRACORPOREAL SHOCK WAVE LITHOTRIPSY Right 10/29/2019   Procedure: EXTRACORPOREAL SHOCK WAVE LITHOTRIPSY (ESWL);  Surgeon: Billey Co, MD;  Location: [facility];  Service: Urology;  Laterality: Right;  . HEMORRHOID SURGERY    . HEMORRHOID SURGERY    . KNEE SURGERY Right 2013  . NASAL SEPTUM SURGERY    . PARATHYROID EXPLORATION N/A 02/12/2020   Procedure: PARATHYROID EXPLORATION WITH PARATHROIDECTOMY;  Surgeon: Armandina Gemma, MD;  Location: [facility];  Service: General;  Laterality: N/A;    Current Medications: Current Meds  Medication Sig  . cholecalciferol (VITAMIN D3) 25 MCG (1000 UNIT) tablet Take 1,000 Units by mouth daily.  . clopidogrel (PLAVIX) 75 MG tablet TAKE 1 TABLET (75 MG TOTAL) BY MOUTH DAILY AT 6 (SIX) AM.  . fexofenadine (ALLEGRA) 180 MG tablet Take 180 mg by mouth daily.   . fluticasone (FLONASE) 50 MCG/ACT nasal spray SPRAY 2 SPRAYS INTO EACH NOSTRIL EVERY DAY  . pantoprazole (PROTONIX) 40 MG tablet Take 1 tablet (40 mg total) by mouth daily.  [name] PRALUENT 75 MG/ML SOAJ Inject 75 mg into the skin every 14 (fourteen) days.  . [DISCONTINUED] losartan (COZAAR) 50 MG tablet Take 1 tablet (50 mg total) by mouth daily.  . [DISCONTINUED] metoprolol succinate (TOPROL-XL) 50 MG 24 hr tablet Take 1 tablet (  50 mg total) by mouth daily. Take with or immediately following a meal.     Allergies:   Sertraline hcl, Amlodipine, Crestor [rosuvastatin calcium], Lipitor [atorvastatin calcium], and Zocor [simvastatin - high dose]   Social History   Socioeconomic History  . Marital status: Married    Spouse name: Not on file  . Number of children: Not on file  . Years of education: Not on file  . Highest education level: Not on file  Occupational History  . Not on file  Tobacco Use  . Smoking status: Former Smoker    Packs/day: 0.25    Years: 35.00    Pack years: 8.75    Types: Cigarettes    Quit date: 10/08/2019    Years since  quitting: 0.5  . Smokeless tobacco: Former Systems developer    Types: Chew, Snuff  . Tobacco comment: couple of months - 40 yrs ago  Vaping Use  . Vaping Use: Former  Substance and Sexual Activity  . Alcohol use: Yes    Alcohol/week: 4.0 - 5.0 standard drinks    Types: 4 - 5 Cans of beer per week    Comment: daily   . Drug use: No  . Sexual activity: Not on file  Other Topics Concern  . Not on file  Social History Narrative   Coaches basketball   Social Determinants of Health   Financial Resource Strain: Not on file  Food Insecurity: Not on file  Transportation Needs: Not on file  Physical Activity: Not on file  Stress: Not on file  Social Connections: Not on file     Family History: The patient's family history includes Cancer in his father; Colon cancer in an other family member; Diabetes in his mother; Esophageal cancer in his paternal uncle; Heart disease in an other family member; Hyperlipidemia in his father and mother; Hypertension in his father, mother, and sister; Lung cancer in an other family member. There is no history of Stomach cancer or Rectal cancer.  ROS:   Please see the history of present illness.     All other systems reviewed and are negative.  EKGs/Labs/Other Studies Reviewed:    The following studies were reviewed today:   EKG:  EKG is  ordered today.  The ekg ordered today demonstrates sinus rhythm, normal ECG.  Recent Labs: 06/12/2019: Magnesium 2.2 10/26/2019: ALT 32 01/05/2020: TSH 1.18 02/09/2020: Hemoglobin 14.7; Platelets 233 02/13/2020: BUN 24; Creatinine, Ser 1.16; Potassium 4.4; Sodium 134  Recent Lipid Panel    Component Value Date/Time   CHOL 185 10/28/2019 0940   TRIG 224 (H) 10/28/2019 0940   HDL 56 10/28/2019 0940   CHOLHDL 3.3 10/28/2019 0940   VLDL 45 (H) 10/28/2019 0940   LDLCALC 84 10/28/2019 0940   LDLDIRECT 225.0 05/01/2019 0938    Physical Exam:    VS:  BP (!) 146/100 (BP Location: Left Arm, Patient Position: Sitting, Cuff  Size: Normal)   Pulse 69   Ht 6' (1.829 m)   Wt 205 lb (93 kg)   SpO2 98%   BMI 27.80 kg/m     Wt Readings from Last 3 Encounters:  04/25/20 205 lb (93 kg)  02/12/20 201 lb (91.2 kg)  02/09/20 201 lb (91.2 kg)     GEN:  Well nourished, well developed in no acute distress HEENT: Normal NECK: No JVD; No carotid bruits LYMPHATICS: No lymphadenopathy CARDIAC: RRR, no murmurs, rubs, gallops RESPIRATORY:  Clear to auscultation without rales, wheezing or rhonchi  ABDOMEN: Soft, non-tender, non-distended  MUSCULOSKELETAL:  No edema; No deformity  SKIN: Warm and dry NEUROLOGIC:  Alert and oriented x 3 PSYCHIATRIC:  Normal affect   ASSESSMENT:    1. Precordial pain   2. Pure hypercholesterolemia   3. PAD (peripheral artery disease) ([place])   4. Essential hypertension    PLAN:     1. Precordial pain, recent parathyroid surgery which might be contributing.  Get echo to evaluate systolic and diastolic function.  If symptoms persist may consider stress testing, patient is high risk with peripheral artery disease, risk factors of hypertension, hyperlipidemia. 2. Patient with history of hyperlipidemia.  Intolerant to statins, continue Praluent.  Last LDL 84.  Reasonable. 3. Patient history of peripheral artery disease, AAA status post endovascular repair and iliac stents placement.  Continue Praluent, Plavix.  Follows up with vascular surgery. 4. History of hypertension.  Blood pressure elevated, stop Toprol due to anorgasmia, problems with erection.  Start losartan 50 mg daily.  Advised to check BP frequently and keep pressure log.  Follow-up after echocardiogram  Total encounter time 40 minutes  Greater than 50% was spent in counseling and coordination of care with the patient  This note was generated in part or whole with voice recognition software. Voice recognition is usually quite accurate but there are transcription errors that can and very often do occur. I apologize for any  typographical errors that were not detected and corrected.  Medication Adjustments/Labs and Tests Ordered: Current medicines are reviewed at length with the patient today.  Concerns regarding medicines are outlined above.  Orders Placed This Encounter  Procedures  . EKG 12-Lead  . ECHOCARDIOGRAM COMPLETE   Meds ordered this encounter  Medications  . DISCONTD: losartan (COZAAR) 50 MG tablet    Sig: Take 1 tablet (50 mg total) by mouth daily.    Dispense:  30 tablet    Refill:  5  . losartan (COZAAR) 50 MG tablet    Sig: Take 1 tablet (50 mg total) by mouth daily.    Dispense:  30 tablet    Refill:  5    Patient Instructions  Medication Instructions:   Your physician has recommended you make the following change in your medication:   1.  STOP taking your Toprol XL (Metoprolol Succinate).  2.  START taking Losartan (Cozaar) 50 MG: Take 1 tablet by mouth daily.  *If you need a refill on your cardiac medications before your next appointment, please call your pharmacy*   Lab Work:  None Ordered   Testing/Procedures:  Your physician has requested that you have an echocardiogram. Echocardiography is a painless test that uses sound waves to create images of your heart. It provides your doctor with information about the size and shape of your heart and how well your heart's chambers and valves are working. This procedure takes approximately one hour. There are no restrictions for this procedure.    Follow-Up: At [facility], you and your health needs are our priority.  As part of our continuing mission to provide you with exceptional heart care, we have created designated Provider Care Teams.  These Care Teams include your primary Cardiologist (physician) and Advanced Practice Providers (APPs -  Physician Assistants and Nurse Practitioners) who all work together to provide you with the care you need, when you need it.  We recommend signing up for the patient portal called  "MyChart".  Sign up information is provided on this After Visit Summary.  MyChart is used to connect with patients for Virtual Visits (  Telemedicine).  Patients are able to view lab/test results, encounter notes, upcoming appointments, etc.  Non-urgent messages can be sent to your provider as well.   To learn more about what you can do with MyChart, go to [email or web address].    Your next appointment:   Follow up after Echo   The format for your next appointment:   In Person  Provider:   Kate Sable, MD   Other Instructions      Signed, Kate Sable, MD  04/25/2020 1:13 PM    [place]

## 2020-05-11 ENCOUNTER — Telehealth: Payer: Self-pay

## 2020-05-11 NOTE — Telephone Encounter (Signed)
Per fax received from [place] request has been approved from 05/11/20 through 05/11/21.

## 2020-05-12 ENCOUNTER — Ambulatory Visit (INDEPENDENT_AMBULATORY_CARE_PROVIDER_SITE_OTHER): Payer: 59

## 2020-05-12 ENCOUNTER — Other Ambulatory Visit: Payer: Self-pay

## 2020-05-12 DIAGNOSIS — R072 Precordial pain: Secondary | ICD-10-CM

## 2020-05-12 LAB — ECHOCARDIOGRAM COMPLETE
Area-P 1/2: 4.1 cm2
MV M vel: 5.08 m/s
MV Peak grad: 103.2 mmHg
S' Lateral: 2.9 cm

## 2020-05-13 ENCOUNTER — Encounter: Payer: Self-pay | Admitting: Internal Medicine

## 2020-05-13 ENCOUNTER — Ambulatory Visit (INDEPENDENT_AMBULATORY_CARE_PROVIDER_SITE_OTHER): Payer: 59 | Admitting: Internal Medicine

## 2020-05-13 VITALS — BP 140/82 | HR 91 | Ht 72.0 in | Wt 207.2 lb

## 2020-05-13 DIAGNOSIS — E559 Vitamin D deficiency, unspecified: Secondary | ICD-10-CM

## 2020-05-13 DIAGNOSIS — E042 Nontoxic multinodular goiter: Secondary | ICD-10-CM

## 2020-05-13 DIAGNOSIS — E210 Primary hyperparathyroidism: Secondary | ICD-10-CM | POA: Diagnosis not present

## 2020-05-13 NOTE — Progress Notes (Signed)
Patient ID: Antonio Russell, male   DOB: 09-08-1960, 60 y.o.   MRN: [id number]   This visit occurred during the SARS-CoV-2 public health emergency.  Safety protocols were in place, including screening questions prior to the visit, additional usage of staff PPE, and extensive cleaning of exam room while observing appropriate contact time as indicated for disinfecting solutions.   HPI  Antonio Russell is a 60 y.o.-year-old male, initially referred by his PCP, Dr. Glori Bickers, returning for follow-up for hypercalcemia/hyperparathyroidism and vitamin D deficiency.  Last visit 4 months ago.  He had parathyroid sx 02/2020 >> he feels better and he had no more kidney stones she is discharged..  Reviewed and addended history: She has had hypercalcemia for at least 2 years.  Reviewed pertinent labs: Postop calcium: 9.5, PTH 28 Lab Results  Component Value Date   PTH 60 06/12/2019   PTH 133 (H) 05/01/2019   PTH 137 (H) 01/13/2019   CALCIUM 9.5 02/13/2020   CALCIUM 10.7 (H) 02/09/2020   CALCIUM 10.3 10/26/2019   CALCIUM 10.5 (H) 09/15/2019   CALCIUM 10.6 (H) 08/13/2019   CALCIUM 10.8 (H) 08/10/2019   CALCIUM 11.4 (H) 06/12/2019   CALCIUM 11.9 (H) 05/01/2019   CALCIUM 11.5 (H) 01/13/2019   CALCIUM 11.1 (H) 01/13/2019   No history of osteoporosis/fracture/falls.  Is not on HCTZ.  Phosphorus was low:  Lab Results  Component Value Date   PHOS 2.1 (L) 06/12/2019   PHOS 3.0 05/01/2019    Further investigation 0.7) hyperparathyroidism: Component     Latest Ref Rng & Units 06/12/2019          Vitamin D 1, 25 (OH) Total     18 - 72 pg/mL 120 (H)  Vitamin D3 1, 25 (OH)     pg/mL 68  Vitamin D2 1, 25 (OH)     pg/mL 52  Magnesium     1.5 - 2.5 mg/dL 2.2  Creatinine, 24H Ur     0.50 - 2.15 g/24 h 1.56  Calcium, 24H Urine     mg/24 h 401 (H)   FECa = 0.02%  At last visit, I referred him to surgery.  He had the following investigations: Technetium sestamibi scan (10/14/2019): Right lower pole  possible parathyroid adenoma Thyroid ultrasound (10/14/2019): Small thyroid nodules: Parenchymal Echotexture: Normal Isthmus: Normal in size measuring 0.4 cm in diameter Right lobe: Normal in size measuring 4.8 x 2.0 x 1.6 cm Left lobe: Normal in size measuring 4.3 x 1.9 x 1.6 cm _________________________________________________________  Nodule # 1: Location: Right; Mid Maximum size: 1.2 cm; Other 2 dimensions: 1.2 x 0.8 cm Composition: solid/almost completely solid (2) Echogenicity: hypoechoic (2)  *Given size (>/= 1 - 1.4 cm) and appearance, a follow-up ultrasound in 1 year should be considered based on TI-RADS criteria. _________________________________________________________  Nodule # 2: Location: Right; Inferior Maximum size: 1.0 cm; Other 2 dimensions: 0.8 x 0.8 cm Composition: solid/almost completely solid (2) Echogenicity: hypoechoic (2)  *Given size (>/= 1 - 1.4 cm) and appearance, a follow-up ultrasound in 1 year should be considered based on TI-RADS criteria. _________________________________________________________  There are 2 punctate, approximately 0.8 cm) hypoechoic nodules within the mid (labeled 3) and inferior (labeled 4) aspects the left lobe of the thyroid, neither of which meet criteria to recommend percutaneous sampling or continued dedicated follow-up.  No definitive extra thyroidal nodules are identified to suggest the presence of a parathyroid adenoma.  IMPRESSION: 1. Findings suggestive of multinodular goiter. 2. Nodules #1 and #2 both meet  imaging criteria to recommend a 1 year follow-up as indicated. 3. No definitive extra thyroidal nodules to suggest the presence of a parathyroid adenoma.  4D CT (11/06/2019): Carotid stenosis but no convincing parathyroid lesion.   Since then, he saw vascular surgery on 12/25/2019 >> mild >> he was cleared for surgery.  He had right inferior parathyroid exploratory parathyroidectomy with Dr. Harlow Asa on  02/12/2020.  Pathology showed a 1.1 cm, 0.464 g hypercellular mass, consistent with adenoma.  Calcium decreased appropriately postop.  Latest BUN/Cr: Lab Results  Component Value Date   BUN 24 (H) 02/13/2020   BUN 17 02/09/2020   CREATININE 1.16 02/13/2020   CREATININE 1.04 02/09/2020   He has a history of vitamin D deficiency: Lab Results  Component Value Date   VD25OH 29.1 (L) 01/05/2020   VD25OH 27.43 (L) 06/05/2019   VD25OH 19.52 (L) 05/01/2019   VD25OH 15.29 (L) 01/13/2019   At last visit, for 6 out of 7 days, he was taking: 2000 IU daily (started 0/2020) + ergocalciferol 50,000 units weekly (started 04/2019).  However, he is now on 5000 units daily.  No family history of hypercalcemia or pituitary tumors.  He has a half sister with thyroid cancer.  Lab Results  Component Value Date   TSH 1.18 01/05/2020   Pt. also has a history of L posterior thigh muscles pain and weakness 4 years ago >> now bilateral >> also anterior aspects of thighs.  He also has a h/o chest pain - s/p catheterization, HTN, HL - on Praluent, high WBC.  Metoprolol changed to Losartan since last OV - 2/2 ED.  ROS: Constitutional: no weight gain/no weight loss, no fatigue, no subjective hyperthermia, no subjective hypothermia Eyes: no blurry vision, no xerophthalmia ENT: no sore throat, no nodules palpated in neck, no dysphagia, no odynophagia, no hoarseness Cardiovascular: no CP/no SOB/no palpitations/no leg swelling Respiratory: no cough/no SOB/no wheezing Gastrointestinal: no N/no V/no D/no C/no acid reflux Musculoskeletal: + muscle aches/+ joint aches Skin: no rashes, no hair loss Neurological: no tremors/no numbness/no tingling/no dizziness  I reviewed pt's medications, allergies, PMH, social hx, family hx, and changes were documented in the history of present illness. Otherwise, unchanged from my initial visit note.  Past Medical History:  Diagnosis Date  . Allergic rhinitis   .  Allergy   . Anxiety   . Arthritis   . Back pain   . Chest pain 2012   cardiac cath - nonobstructive, nl LV fxn, rec aggressive med management  . GERD (gastroesophageal reflux disease)   . Headache(784.0)   . History of kidney stones   . HLD (hyperlipidemia)   . Hyperparathyroidism ([place])   . Hypertension   . Impotence of organic origin   . Other testicular hypofunction   . Peripheral vascular disease ([place])   . Personal history of urinary calculi   . Sleep apnea    no c-pap or bi-pap  . Thyroid disease    Hyperparathyroidism  . Tobacco use disorder   . Unspecified hearing loss    Past Surgical History:  Procedure Laterality Date  . CARDIAC CATHETERIZATION    . COLONOSCOPY    . ENDOVASCULAR REPAIR/STENT GRAFT N/A 08/12/2019   Procedure: ENDOVASCULAR REPAIR/STENT GRAFT;  Surgeon: Algernon Huxley, MD;  Location: [place] [facility];  Service: Cardiovascular;  Laterality: N/A;  . EXTRACORPOREAL SHOCK WAVE LITHOTRIPSY Right 10/29/2019   Procedure: EXTRACORPOREAL SHOCK WAVE LITHOTRIPSY (ESWL);  Surgeon: Billey Co, MD;  Location: [facility];  Service: Urology;  Laterality:  Right;  [name] HEMORRHOID SURGERY    . HEMORRHOID SURGERY    . KNEE SURGERY Right 2013  . NASAL SEPTUM SURGERY    . PARATHYROID EXPLORATION N/A 02/12/2020   Procedure: PARATHYROID EXPLORATION WITH PARATHROIDECTOMY;  Surgeon: Armandina Gemma, MD;  Location: [facility];  Service: General;  Laterality: N/A;   Social History   Socioeconomic History  . Marital status: Married    Spouse name: Not on file  . Number of children: 3  . Years of education: Not on file  . Highest education level: Not on file  Occupational History  .  Glass blower/designer  Tobacco Use  . Smoking status: Current Every Day Smoker    Packs/day: 0.50    Years: 35.00    Pack years: 17.50    Types: Cigarettes  . Smokeless tobacco: Former Network engineer and Sexual Activity  . Alcohol use: Yes    Alcohol/week:  3-4 beers 5 days a week      . Drug  use: No  . Sexual activity: Not on file  Other Topics Concern  . Not on file  Social History Narrative   Coaches basketball   Social Determinants of Health   Financial Resource Strain:   . Difficulty of Paying Living Expenses: Not on file  Food Insecurity:   . Worried About Charity fundraiser in the Last Year: Not on file  . Ran Out of Food in the Last Year: Not on file  Transportation Needs:   . Lack of Transportation (Medical): Not on file  . Lack of Transportation (Non-Medical): Not on file  Physical Activity:   . Days of Exercise per Week: Not on file  . Minutes of Exercise per Session: Not on file  Stress:   . Feeling of Stress : Not on file  Social Connections:   . Frequency of Communication with Friends and Family: Not on file  . Frequency of Social Gatherings with Friends and Family: Not on file  . Attends Religious Services: Not on file  . Active Member of Clubs or Organizations: Not on file  . Attends Archivist Meetings: Not on file  . Marital Status: Not on file  Intimate Partner Violence:   . Fear of Current or Ex-Partner: Not on file  . Emotionally Abused: Not on file  . Physically Abused: Not on file  . Sexually Abused: Not on file   Current Outpatient Medications on File Prior to Visit  Medication Sig Dispense Refill  . cholecalciferol (VITAMIN D3) 25 MCG (1000 UNIT) tablet Take 1,000 Units by mouth daily.    . clopidogrel (PLAVIX) 75 MG tablet TAKE 1 TABLET (75 MG TOTAL) BY MOUTH DAILY AT 6 (SIX) AM. 90 tablet 1  . fexofenadine (ALLEGRA) 180 MG tablet Take 180 mg by mouth daily.     . fluticasone (FLONASE) 50 MCG/ACT nasal spray SPRAY 2 SPRAYS INTO EACH NOSTRIL EVERY DAY 16 g 5  . losartan (COZAAR) 50 MG tablet Take 1 tablet (50 mg total) by mouth daily. 30 tablet 5  . pantoprazole (PROTONIX) 40 MG tablet Take 1 tablet (40 mg total) by mouth daily. 90 tablet 3  . PRALUENT 75 MG/ML SOAJ Inject 75 mg into the skin every 14 (fourteen) days. 2 mL 2    No current facility-administered medications on file prior to visit.   Allergies  Allergen Reactions  . Sertraline Hcl Other (See Comments)    Tremors  . Amlodipine Other (See Comments)    Sexual side effects  .  Crestor [Rosuvastatin Calcium] Other (See Comments)    Myalgia  . Lipitor [Atorvastatin Calcium] Other (See Comments)    Myalgia  . Zocor [Simvastatin - High Dose] Other (See Comments)    Myalgia    Family History  Problem Relation Age of Onset  . Hypertension Mother   . Hyperlipidemia Mother   . Diabetes Mother   . Hypertension Father   . Hyperlipidemia Father   . Cancer Father        throat  . Hypertension Sister   . Lung cancer Other        GF  . Colon cancer Other        dx "close to 2"  . Heart disease Other        GM  . Esophageal cancer Paternal Uncle   . Stomach cancer Neg Hx   . Rectal cancer Neg Hx    PE: BP 140/82   Pulse 91   Ht 6' (1.829 m)   Wt 207 lb 3.2 oz (94 kg)   SpO2 97%   BMI 28.10 kg/m  Wt Readings from Last 3 Encounters:  05/13/20 207 lb 3.2 oz (94 kg)  04/25/20 205 lb (93 kg)  02/12/20 201 lb (91.2 kg)   Constitutional: overweight, in NAD Eyes: PERRLA, EOMI, no exophthalmos ENT: moist mucous membranes, no neck masses palpated, surgical scar healing, no cervical lymphadenopathy Cardiovascular: RRR, No MRG Respiratory: CTA B Gastrointestinal: abdomen soft, NT, ND, BS+ Musculoskeletal: no deformities, strength intact in all 4 Skin: moist, warm, no rashes Neurological: no tremor with outstretched hands, DTR normal in all 4  Assessment: 1. Hypercalcemia/hyperparathyroidism  2.  Vitamin D deficiency  3.  Thyroid nodules  Plan: Patient has had several instances of elevated calcium, with the highest level being at 11.9.  An intact PTH level was also high, at 137 for calcium of 11.1-11.5.  -He has a history of kidney stones and had to have ESWL.  No known history of osteoporosis or fractures. -We investigated him for  primary hyperparathyroidism and the investigation was positive.  He had a high calcium, high PTH, a high 24-hour urine calcium with a  fractional excretion of calcium higher than 0.01, low phosphorus, normal magnesium and high calcitriol. -I referred him to Dr. Harlow Asa with surgery.  He had a technetium sestamibi scan which showed a possible right lower pole adenoma.  Of note, the thyroid ultrasound in the [facility] ED did not show an adenoma. -Since last visit, he had parathyroid exploratory surgery on 02/12/2020.  He had a right inferior parathyroid resected.  Pathology showed a 1.1 cm, 0.464 g, hypercellular mass consistent with adenoma.  His calcium decreased from 10.7 preop to 9.5. PTH decreased from 133 to 28. -At today's visit, we will recheck a PTH, calcium, and vitamin D  2.  Vitamin D deficiency -He has a history of vitamin D deficiency with a level of 15 in 01/2019, when he was started on 50,000 units ergocalciferol weekly.  We switched to daily vitamin D  supplement. -He continues on 5000 units daily-at last visit, vitamin D level was still slightly low, and 29.1 and I advised him to take this consistently -We will recheck his level today  3.  Thyroid nodules -He denies neck compression symptoms -Latest TSH was normal: Lab Results  Component Value Date   TSH 1.18 01/05/2020  -Plan to repeat a thyroid ultrasound in 1 year from the previous -For now, we will repeat the TSH -I will see him back in  a year, but I advised him to get in touch with me in 09/2020 so I can order the next ultrasound.  Component     Latest Ref Rng & Units 05/16/2020  PTH, Intact     15 - 65 pg/mL 39  Calcium, Total (PTH)     8.7 - 10.2 mg/dL 9.4  PTH Interp      Comment  TSH     0.350 - 4.500 uIU/mL 1.291  Vitamin D, 25-Hydroxy     30 - 100 ng/mL 62.37  Tests are all normal.  Philemon Kingdom, MD PhD [facility] Endocrinology

## 2020-05-13 NOTE — Patient Instructions (Signed)
Please stop at the lab.  Please get in touch with me in July to order your next thyroid ultrasound.  Please come back for another visit in 1 year.

## 2020-05-16 ENCOUNTER — Encounter: Payer: Self-pay | Admitting: Cardiology

## 2020-05-16 ENCOUNTER — Ambulatory Visit (INDEPENDENT_AMBULATORY_CARE_PROVIDER_SITE_OTHER): Payer: 59 | Admitting: Cardiology

## 2020-05-16 ENCOUNTER — Other Ambulatory Visit
Admission: RE | Admit: 2020-05-16 | Discharge: 2020-05-16 | Disposition: A | Discharge status: Home / Self Care | Payer: 59 | Source: Ambulatory Visit | Attending: Cardiology | Admitting: Cardiology

## 2020-05-16 ENCOUNTER — Other Ambulatory Visit
Admission: RE | Admit: 2020-05-16 | Discharge: 2020-05-16 | Disposition: A | Discharge status: Home / Self Care | Payer: 59 | Attending: Internal Medicine | Admitting: Internal Medicine

## 2020-05-16 ENCOUNTER — Other Ambulatory Visit: Payer: Self-pay

## 2020-05-16 VITALS — BP 130/90 | HR 83 | Ht 73.0 in | Wt 206.4 lb

## 2020-05-16 DIAGNOSIS — I10 Essential (primary) hypertension: Secondary | ICD-10-CM | POA: Diagnosis not present

## 2020-05-16 DIAGNOSIS — E7800 Pure hypercholesterolemia, unspecified: Secondary | ICD-10-CM

## 2020-05-16 DIAGNOSIS — R072 Precordial pain: Secondary | ICD-10-CM | POA: Insufficient documentation

## 2020-05-16 DIAGNOSIS — I739 Peripheral vascular disease, unspecified: Secondary | ICD-10-CM | POA: Insufficient documentation

## 2020-05-16 LAB — BASIC METABOLIC PANEL
Anion gap: 9 (ref 5–15)
BUN: 16 mg/dL (ref 6–20)
CO2: 25 mmol/L (ref 22–32)
Calcium: 9.5 mg/dL (ref 8.9–10.3)
Chloride: 103 mmol/L (ref 98–111)
Creatinine, Ser: 0.96 mg/dL (ref 0.61–1.24)
GFR, Estimated: >60 mL/min (ref >60)
Glucose, Bld: 122 mg/dL — ABNORMAL HIGH (ref 70–99)
Potassium: 4.8 mmol/L (ref 3.5–5.1)
Sodium: 137 mmol/L (ref 135–145)

## 2020-05-16 LAB — VITAMIN D 25 HYDROXY (VIT D DEFICIENCY, FRACTURES): Vit D, 25-Hydroxy: 62.37 ng/mL (ref 30–100)

## 2020-05-16 LAB — TSH: TSH: 1.291 u[IU]/mL (ref 0.350–4.500)

## 2020-05-16 MED ORDER — METOPROLOL TARTRATE 100 MG PO TABS: 100.0000 mg | ORAL_TABLET | Freq: Once | ORAL | 0 refills | Status: DC

## 2020-05-16 NOTE — Patient Instructions (Signed)
Medication Instructions:  Your physician recommends that you continue on your current medications as directed. Please refer to the Current Medication list given to you today.  *If you need a refill on your cardiac medications before your next appointment, please call your pharmacy*   Lab Work:  BMP drawn today.  If you have labs (blood work) drawn today and your tests are completely normal, you will receive your results only by: [name] MyChart Message (if you have MyChart) OR . A paper copy in the mail If you have any lab test that is abnormal or we need to change your treatment, we will call you to review the results.   Testing/Procedures:  1.  Your physician has requested that you have cardiac CT. Cardiac computed tomography (CT) is a painless test that uses an x-ray machine to take clear, detailed pictures of your heart.   Your cardiac CT will be scheduled at one of the below locations:   [facility] [address] [place], [place] [address] [phone]  [place] [address] [place], [place] [address] [phone]  If scheduled at [facility], please arrive at the [facility] main entrance (entrance A) of [facility] 30 minutes prior to test start time. Proceed to the [facility] Radiology Department (first floor) to check-in and test prep.  If scheduled at [facility], please arrive 15 mins early for check-in and test prep.  Please follow these instructions carefully (unless otherwise directed):   Hold all erectile dysfunction medications at least 3 days (72 hrs) prior to test.   On the Night Before the Test: . Be sure to Drink plenty of water. . Do not consume any caffeinated/decaffeinated beverages or chocolate 12 hours prior to your test. . Do not take any antihistamines 12 hours prior to your test.    On the Day of the Test: . Drink plenty of water  until 1 hour prior to the test. . Do not eat any food 4 hours prior to the test. . You may take your regular medications prior to the test.  Take metoprolol (Lopressor) two hours prior to test. (this was sent to your pharmacy)        After the Test: . Drink plenty of water. . After receiving IV contrast, you may experience a mild flushed feeling. This is normal. . On occasion, you may experience a mild rash up to 24 hours after the test. This is not dangerous. If this occurs, you can take Benadryl 25 mg and increase your fluid intake. . If you experience trouble breathing, this can be serious. If it is severe call 911 IMMEDIATELY. If it is mild, please call our office. . If you take any of these medications: Glipizide/Metformin, Avandament, Glucavance, please do not take 48 hours after completing test unless otherwise instructed.   Once we have confirmed authorization from your insurance company, we will call you to set up a date and time for your test. Based on how quickly your insurance processes prior authorizations requests, please allow up to 4 weeks to be contacted for scheduling your Cardiac CT appointment. Be advised that routine Cardiac CT appointments could be scheduled as many as 8 weeks after your provider has ordered it.  For non-scheduling related questions, please contact the cardiac imaging nurse navigator should you have any questions/concerns: Marchia Bond, Cardiac Imaging Nurse Navigator Gordy Clement, Cardiac Imaging Nurse Navigator  Heart and Vascular  Herbalist Dial: [phone]   For scheduling needs, including cancellations and rescheduling, please call [place], [phone].     Follow-Up: At [facility], you and your health needs are our priority.  As part of our continuing mission to provide you with exceptional heart care, we have created designated Provider Care Teams.  These Care Teams include your primary Cardiologist (physician)  and Advanced Practice Providers (APPs -  Physician Assistants and Nurse Practitioners) who all work together to provide you with the care you need, when you need it.  We recommend signing up for the patient portal called "MyChart".  Sign up information is provided on this After Visit Summary.  MyChart is used to connect with patients for Virtual Visits (Telemedicine).  Patients are able to view lab/test results, encounter notes, upcoming appointments, etc.  Non-urgent messages can be sent to your provider as well.   To learn more about what you can do with MyChart, go to [email or web address].    Your next appointment:   7 week(s)  The format for your next appointment:   In Person  Provider:   Kate Sable, MD   Other Instructions

## 2020-05-16 NOTE — Progress Notes (Signed)
Cardiology Office Note:    Date:  05/16/2020   ID:  Antonio Russell, DOB 1960/09/25, MRN [id number]  PCP:  Abner Greenspan, MD  Cardiologist:  Kate Sable, MD  Electrophysiologist:  None   Referring MD: Abner Greenspan, MD   Chief Complaint  Patient presents with  . Follow up Echo    Patient c/o chest discomfort at times. Medications reviewed by the patient verbally.     History of Present Illness:    Antonio Russell is a 60 y.o. male with a hx of hyperlipidemia, hypertension, former smoker x40+ years, AAA s/p endoprosthetic repair 08/2019,  peripheral arterial disease s/p R and L iliac stents, sleep apnea who presents for follow-up.    Previously seen for elevated blood pressures and chest discomfort in the context of recent parathyroid surgery.  Echo was ordered to evaluate cardiac systolic and diastolic function.  Symptoms with pain likely noncardiac and secondary to recent surgery.  He complains of anorgasmia/problems with erection, beta-blocker/metoprolol was stopped, losartan started.  Blood pressure is well controlled at home with systolics in the 856D.  Symptoms of anorgasmia have improved somewhat.  He still complains of chest discomfort.   Past Medical History:  Diagnosis Date  . Allergic rhinitis   . Allergy   . Anxiety   . Arthritis   . Back pain   . Chest pain 2012   cardiac cath - nonobstructive, nl LV fxn, rec aggressive med management  . GERD (gastroesophageal reflux disease)   . Headache(784.0)   . History of kidney stones   . HLD (hyperlipidemia)   . Hyperparathyroidism ([place])   . Hypertension   . Impotence of organic origin   . Other testicular hypofunction   . Peripheral vascular disease ([place])   . Personal history of urinary calculi   . Sleep apnea    no c-pap or bi-pap  . Thyroid disease    Hyperparathyroidism  . Tobacco use disorder   . Unspecified hearing loss     Past Surgical History:  Procedure Laterality Date  . CARDIAC CATHETERIZATION     . COLONOSCOPY    . ENDOVASCULAR REPAIR/STENT GRAFT N/A 08/12/2019   Procedure: ENDOVASCULAR REPAIR/STENT GRAFT;  Surgeon: Algernon Huxley, MD;  Location: [facility];  Service: Cardiovascular;  Laterality: N/A;  . EXTRACORPOREAL SHOCK WAVE LITHOTRIPSY Right 10/29/2019   Procedure: EXTRACORPOREAL SHOCK WAVE LITHOTRIPSY (ESWL);  Surgeon: Billey Co, MD;  Location: [facility];  Service: Urology;  Laterality: Right;  . HEMORRHOID SURGERY    . HEMORRHOID SURGERY    . KNEE SURGERY Right 2013  . NASAL SEPTUM SURGERY    . PARATHYROID EXPLORATION N/A 02/12/2020   Procedure: PARATHYROID EXPLORATION WITH PARATHROIDECTOMY;  Surgeon: Armandina Gemma, MD;  Location: [facility];  Service: General;  Laterality: N/A;    Current Medications: Current Meds  Medication Sig  . cholecalciferol (VITAMIN D3) 25 MCG (1000 UNIT) tablet Take 1,000 Units by mouth daily.  . clopidogrel (PLAVIX) 75 MG tablet TAKE 1 TABLET (75 MG TOTAL) BY MOUTH DAILY AT 6 (SIX) AM.  . fexofenadine (ALLEGRA) 180 MG tablet Take 180 mg by mouth daily.   . fluticasone (FLONASE) 50 MCG/ACT nasal spray SPRAY 2 SPRAYS INTO EACH NOSTRIL EVERY DAY  . losartan (COZAAR) 50 MG tablet Take 1 tablet (50 mg total) by mouth daily.  . metoprolol tartrate (LOPRESSOR) 100 MG tablet Take 1 tablet (100 mg total) by mouth once for 1 dose. Take 2 hours prior to your CT scan.  [name]  pantoprazole (PROTONIX) 40 MG tablet Take 1 tablet (40 mg total) by mouth daily.  [name] PRALUENT 75 MG/ML SOAJ Inject 75 mg into the skin every 14 (fourteen) days.     Allergies:   Sertraline hcl, Amlodipine, Crestor [rosuvastatin calcium], Lipitor [atorvastatin calcium], and Zocor [simvastatin - high dose]   Social History   Socioeconomic History  . Marital status: Married    Spouse name: Not on file  . Number of children: Not on file  . Years of education: Not on file  . Highest education level: Not on file  Occupational History  . Not on file  Tobacco Use  . Smoking  status: Former Smoker    Packs/day: 0.25    Years: 35.00    Pack years: 8.75    Types: Cigarettes    Quit date: 10/08/2019    Years since quitting: 0.6  . Smokeless tobacco: Former Systems developer    Types: Chew, Snuff  . Tobacco comment: couple of months - 40 yrs ago  Vaping Use  . Vaping Use: Former  Substance and Sexual Activity  . Alcohol use: Yes    Alcohol/week: 4.0 - 5.0 standard drinks    Types: 4 - 5 Cans of beer per week    Comment: daily   . Drug use: No  . Sexual activity: Not on file  Other Topics Concern  . Not on file  Social History Narrative   Coaches basketball   Social Determinants of Health   Financial Resource Strain: Not on file  Food Insecurity: Not on file  Transportation Needs: Not on file  Physical Activity: Not on file  Stress: Not on file  Social Connections: Not on file     Family History: The patient's family history includes Cancer in his father; Colon cancer in an other family member; Diabetes in his mother; Esophageal cancer in his paternal uncle; Heart disease in an other family member; Hyperlipidemia in his father and mother; Hypertension in his father, mother, and sister; Lung cancer in an other family member. There is no history of Stomach cancer or Rectal cancer.  ROS:   Please see the history of present illness.     All other systems reviewed and are negative.  EKGs/Labs/Other Studies Reviewed:    The following studies were reviewed today:   EKG:  EKG is  ordered today.  The ekg ordered today demonstrates sinus rhythm, normal ECG.  Recent Labs: 06/12/2019: Magnesium 2.2 10/26/2019: ALT 32 02/09/2020: Hemoglobin 14.7; Platelets 233 05/16/2020: BUN 16; Creatinine, Ser 0.96; Potassium 4.8; Sodium 137; TSH 1.291  Recent Lipid Panel    Component Value Date/Time   CHOL 185 10/28/2019 0940   TRIG 224 (H) 10/28/2019 0940   HDL 56 10/28/2019 0940   CHOLHDL 3.3 10/28/2019 0940   VLDL 45 (H) 10/28/2019 0940   LDLCALC 84 10/28/2019 0940    LDLDIRECT 225.0 05/01/2019 0938    Physical Exam:    VS:  BP 130/90 (BP Location: Left Arm, Patient Position: Sitting, Cuff Size: Normal)   Pulse 83   Ht 6\' 1"  (1.854 m)   Wt 206 lb 6 oz (93.6 kg)   SpO2 98%   BMI 27.23 kg/m     Wt Readings from Last 3 Encounters:  05/16/20 206 lb 6 oz (93.6 kg)  05/13/20 207 lb 3.2 oz (94 kg)  04/25/20 205 lb (93 kg)     GEN:  Well nourished, well developed in no acute distress HEENT: Normal NECK: No JVD; No carotid bruits LYMPHATICS: No  lymphadenopathy CARDIAC: RRR, no murmurs, rubs, gallops RESPIRATORY:  Clear to auscultation without rales, wheezing or rhonchi  ABDOMEN: Soft, non-tender, non-distended MUSCULOSKELETAL:  No edema; No deformity  SKIN: Warm and dry NEUROLOGIC:  Alert and oriented x 3 PSYCHIATRIC:  Normal affect   ASSESSMENT:    1. Precordial pain   2. Pure hypercholesterolemia   3. PAD (peripheral artery disease) ([place])   4. Essential hypertension    PLAN:     1. Precordial pain, persistent.  Recent parathyroid surgery which might be contributing.  Echocardiogram showed normal systolic and diastolic function, EF 55 to 60%.  Has multiple risk factors for CAD.  Get coronary CTA to evaluate presence of CAD. 2. Patient with history of hyperlipidemia.  Intolerant to statins, continue Praluent.   3. history of peripheral artery disease, AAA status post endovascular repair and iliac stents placement.  Continue Praluent, Plavix.  Follow-up with vascular surgery. 4. History of hypertension.  BP much improved.  Controlled at home.  Continue losartan.  Follow-up after coronary CTA  Total encounter time 40 minutes  Greater than 50% was spent in counseling and coordination of care with the patient  This note was generated in part or whole with voice recognition software. Voice recognition is usually quite accurate but there are transcription errors that can and very often do occur. I apologize for any typographical errors that  were not detected and corrected.  Medication Adjustments/Labs and Tests Ordered: Current medicines are reviewed at length with the patient today.  Concerns regarding medicines are outlined above.  Orders Placed This Encounter  Procedures  . CT CORONARY MORPH W/CTA COR W/SCORE W/CA W/CM &/OR WO/CM  . CT CORONARY FRACTIONAL FLOW RESERVE DATA PREP  . CT CORONARY FRACTIONAL FLOW RESERVE FLUID ANALYSIS  . Basic metabolic panel  . EKG 12-Lead   Meds ordered this encounter  Medications  . metoprolol tartrate (LOPRESSOR) 100 MG tablet    Sig: Take 1 tablet (100 mg total) by mouth once for 1 dose. Take 2 hours prior to your CT scan.    Dispense:  1 tablet    Refill:  0    Patient Instructions  Medication Instructions:  Your physician recommends that you continue on your current medications as directed. Please refer to the Current Medication list given to you today.  *If you need a refill on your cardiac medications before your next appointment, please call your pharmacy*   Lab Work:  BMP drawn today.  If you have labs (blood work) drawn today and your tests are completely normal, you will receive your results only by: [name] MyChart Message (if you have MyChart) OR . A paper copy in the mail If you have any lab test that is abnormal or we need to change your treatment, we will call you to review the results.   Testing/Procedures:  1.  Your physician has requested that you have cardiac CT. Cardiac computed tomography (CT) is a painless test that uses an x-ray machine to take clear, detailed pictures of your heart.   Your cardiac CT will be scheduled at one of the below locations:   [facility] [address] [place], [place] [address] [phone]  [place] [address] [place], [place] [address] [phone]  If scheduled at [facility], please arrive at the [facility] main entrance (entrance A) of  [facility] 30 minutes prior to test start time. Proceed to the [facility] Radiology  Department (first floor) to check-in and test prep.  If scheduled at [facility], please arrive 15 mins early for check-in and test prep.  Please follow these instructions carefully (unless otherwise directed):   Hold all erectile dysfunction medications at least 3 days (72 hrs) prior to test.   On the Night Before the Test: . Be sure to Drink plenty of water. . Do not consume any caffeinated/decaffeinated beverages or chocolate 12 hours prior to your test. . Do not take any antihistamines 12 hours prior to your test.    On the Day of the Test: . Drink plenty of water until 1 hour prior to the test. . Do not eat any food 4 hours prior to the test. . You may take your regular medications prior to the test.  Take metoprolol (Lopressor) two hours prior to test. (this was sent to your pharmacy)        After the Test: . Drink plenty of water. . After receiving IV contrast, you may experience a mild flushed feeling. This is normal. . On occasion, you may experience a mild rash up to 24 hours after the test. This is not dangerous. If this occurs, you can take Benadryl 25 mg and increase your fluid intake. . If you experience trouble breathing, this can be serious. If it is severe call 911 IMMEDIATELY. If it is mild, please call our office. . If you take any of these medications: Glipizide/Metformin, Avandament, Glucavance, please do not take 48 hours after completing test unless otherwise instructed.   Once we have confirmed authorization from your insurance company, we will call you to set up a date and time for your test. Based on how quickly your insurance processes prior authorizations requests, please allow up to 4 weeks to be contacted for scheduling your Cardiac CT appointment. Be advised that routine Cardiac CT appointments could be scheduled as many as 8 weeks  after your provider has ordered it.  For non-scheduling related questions, please contact the cardiac imaging nurse navigator should you have any questions/concerns: Marchia Bond, Cardiac Imaging Nurse Navigator Gordy Clement, Cardiac Imaging Nurse Navigator [place] [facility] Direct Office Dial: [phone]   For scheduling needs, including cancellations and rescheduling, please call [place], [phone].     Follow-Up: At [facility], you and your health needs are our priority.  As part of our continuing mission to provide you with exceptional heart care, we have created designated Provider Care Teams.  These Care Teams include your primary Cardiologist (physician) and Advanced Practice Providers (APPs -  Physician Assistants and Nurse Practitioners) who all work together to provide you with the care you need, when you need it.  We recommend signing up for the patient portal called "MyChart".  Sign up information is provided on this After Visit Summary.  MyChart is used to connect with patients for Virtual Visits (Telemedicine).  Patients are able to view lab/test results, encounter notes, upcoming appointments, etc.  Non-urgent messages can be sent to your provider as well.   To learn more about what you can do with MyChart, go to [email or web address].    Your next appointment:   7 week(s)  The format for your next appointment:   In Person  Provider:   Kate Sable, MD   Other Instructions      Signed, Kate Sable, MD  05/16/2020 1:01 PM    [place]

## 2020-05-17 LAB — PTH, INTACT AND CALCIUM
Calcium, Total (PTH): 9.4 mg/dL (ref 8.7–10.2)
PTH: 39 pg/mL (ref 15–65)

## 2020-05-26 NOTE — Progress Notes (Signed)
05/27/2020 9:37 AM   Antonio Russell [birth year] [id number]  Referring provider: Abner Greenspan, MD [address] [place],  [place] [address]  Chief Complaint  Patient presents with  . Nephrolithiasis   Urological history: 1. High risk hematuria - former-smoker - CT angio abd/pelvis 09/2019 1.4 cm low-attenuation left adrenal nodule, previously 1.2 cm, presumably benign adenoma. Right adrenal unremarkable. Stable 1.2 cm cyst from the lower pole right kidney.  No hydronephrosis. Urinary bladder incompletely distended.  Mild prostatic enlargement with central coarse calcifications. - cysto 09/2019 Prostate moderate lateral lobe enlargement with hypervascularity and friability - moderate elevation bladder neck - UA negative for micro heme  2. Nephrolithiasis - s/p ESWL for 1 cm right UPJ stone - history of hyperparathyroidism - s/p parathyroidectomy 02/2020 - KUB 05/27/2020 no stones seen   HPI: Antonio Russell is a 60 y.o. who presents today for follow up.  Last PSA 0.72 in 2018  Patient denies any modifying or aggravating factors.  Patient denies any gross hematuria, dysuria or suprapubic/flank pain.  Patient denies any fevers, chills, nausea or vomiting.    IPSS    Row Name 05/27/20 0900         International Prostate Symptom Score   How often have you had the sensation of not emptying your bladder? Not at All     How often have you had to urinate less than every two hours? Not at All     How often have you found you stopped and started again several times when you urinated? Not at All     How often have you found it difficult to postpone urination? Less than 1 in 5 times     How often have you had a weak urinary stream? Less than 1 in 5 times     How often have you had to strain to start urination? Not at All     How many times did you typically get up at night to urinate? 1 Time     Total IPSS Score 3           Quality of Life due to urinary symptoms   If you were to  spend the rest of your life with your urinary condition just the way it is now how would you feel about that? Pleased            Score:  1-7 Mild 8-19 Moderate 20-35 Severe   Patient is not having spontaneous erections.   He denies any pain or curvature with erections.   Had some success with sildenafil in the past.    SHIM    Row Name 05/27/20 0924         SHIM: Over the last 6 months:   How do you rate your confidence that you could get and keep an erection? Very Low     When you had erections with sexual stimulation, how often were your erections hard enough for penetration (entering your partner)? Almost Never or Never     During sexual intercourse, how often were you able to maintain your erection after you had penetrated (entered) your partner? Almost Never or Never     During sexual intercourse, how difficult was it to maintain your erection to completion of intercourse? Extremely Difficult     When you attempted sexual intercourse, how often was it satisfactory for you? Almost Never or Never           SHIM Total Score   SHIM  5            Score: 1-7 Severe ED 8-11 Moderate ED 12-16 Mild-Moderate ED 17-21 Mild ED 22-25 No ED  PMH: Past Medical History:  Diagnosis Date  . Allergic rhinitis   . Allergy   . Anxiety   . Arthritis   . Back pain   . CAD (coronary artery disease)    a. 2012 Cath: nonobstructive, nl LV fxn, rec aggressive med management; b. 05/2020 Cath/PCI: LM nl, LAD 64m, RI mod diff dzs, LCX 65p/m, OM3 20, RCA 30p, 47m (2.5x12 Resolute Onyx DES).  . GERD (gastroesophageal reflux disease)   . Headache(784.0)   . History of kidney stones   . HLD (hyperlipidemia)   . Hyperparathyroidism ([place])   . Hypertension   . Impotence of organic origin   . Other testicular hypofunction   . Peripheral vascular disease ([place])   . Personal history of urinary calculi   . Sleep apnea    no c-pap or bi-pap  . Thyroid disease    Hyperparathyroidism  .  Tobacco use disorder   . Unspecified hearing loss     Surgical History: Past Surgical History:  Procedure Laterality Date  . CARDIAC CATHETERIZATION    . COLONOSCOPY    . CORONARY STENT INTERVENTION N/A 06/14/2020   Procedure: CORONARY STENT INTERVENTION;  Surgeon: Nelva Bush, MD;  Location: [facility];  Service: Cardiovascular;  Laterality: N/A;  . ENDOVASCULAR REPAIR/STENT GRAFT N/A 08/12/2019   Procedure: ENDOVASCULAR REPAIR/STENT GRAFT;  Surgeon: Algernon Huxley, MD;  Location: [facility];  Service: Cardiovascular;  Laterality: N/A;  . EXTRACORPOREAL SHOCK WAVE LITHOTRIPSY Right 10/29/2019   Procedure: EXTRACORPOREAL SHOCK WAVE LITHOTRIPSY (ESWL);  Surgeon: Billey Co, MD;  Location: [facility];  Service: Urology;  Laterality: Right;  . HEMORRHOID SURGERY    . HEMORRHOID SURGERY    . KNEE SURGERY Right 2013  . LEFT HEART CATH AND CORONARY ANGIOGRAPHY Left 06/14/2020   Procedure: LEFT HEART CATH AND CORONARY ANGIOGRAPHY;  Surgeon: Nelva Bush, MD;  Location: [facility];  Service: Cardiovascular;  Laterality: Left;  . NASAL SEPTUM SURGERY    . PARATHYROID EXPLORATION N/A 02/12/2020   Procedure: PARATHYROID EXPLORATION WITH PARATHROIDECTOMY;  Surgeon: Armandina Gemma, MD;  Location: [facility];  Service: General;  Laterality: N/A;    Home Medications:  Current Outpatient Medications on File Prior to Visit  Medication Sig Dispense Refill  . clopidogrel (PLAVIX) 75 MG tablet TAKE 1 TABLET (75 MG TOTAL) BY MOUTH DAILY AT 6 (SIX) AM. 90 tablet 1  . fexofenadine (ALLEGRA) 180 MG tablet Take 180 mg by mouth daily.     . fluticasone (FLONASE) 50 MCG/ACT nasal spray SPRAY 2 SPRAYS INTO EACH NOSTRIL EVERY DAY (Patient taking differently: Place 2 sprays into both nostrils daily.) 16 g 5  . losartan (COZAAR) 50 MG tablet Take 1 tablet (50 mg total) by mouth daily. 30 tablet 5  . pantoprazole (PROTONIX) 40 MG tablet Take 1 tablet (40 mg total) by mouth daily. 90  tablet 3   No current facility-administered medications on file prior to visit.    Allergies:  Allergies  Allergen Reactions  . Sertraline Hcl Other (See Comments)    Tremors  . Amlodipine Other (See Comments)    Sexual side effects  . Crestor [Rosuvastatin Calcium] Other (See Comments)    Myalgia  . Lipitor [Atorvastatin Calcium] Other (See Comments)    Myalgia  . Zocor [Simvastatin - High Dose] Other (See Comments)  Myalgia     Family History: Family History  Problem Relation Age of Onset  . Hypertension Mother   . Hyperlipidemia Mother   . Diabetes Mother   . Hypertension Father   . Hyperlipidemia Father   . Cancer Father        throat  . Hypertension Sister   . Lung cancer Other        GF  . Colon cancer Other        dx "close to 65"  . Heart disease Other        GM  . Esophageal cancer Paternal Uncle   . Stomach cancer Neg Hx   . Rectal cancer Neg Hx     Social History:  reports that he quit smoking about 8 months ago. His smoking use included cigarettes. He has a 8.75 pack-year smoking history. He has quit using smokeless tobacco.  His smokeless tobacco use included chew and snuff. He reports current alcohol use of about 4.0 - 5.0 standard drinks of alcohol per week. He reports that he does not use drugs.  ROS: Pertinent ROS in HPI  Physical Exam: BP (!) 154/91 (BP Location: Left Arm, Patient Position: Sitting, Cuff Size: Large)   Pulse 75   Ht 6\' 1"  (1.854 m)   Wt 200 lb (90.7 kg)   BMI 26.39 kg/m   Constitutional:  Well nourished. Alert and oriented, No acute distress. HEENT: [place] AT, mask in place.  Trachea midline Cardiovascular: No clubbing, cyanosis, or edema. Respiratory: Normal respiratory effort, no increased work of breathing. GI: Abdomen is soft, non tender, non distended, no abdominal masses. Liver and spleen not palpable.  No hernias appreciated.  Stool sample for occult testing is not indicated.   GU: No CVA tenderness.  No bladder  fullness or masses.  Patient with uncircumcised phallus.  Foreskin easily retracted.   Small mole in the dorsal side of penis.  Urethral meatus is patent.  No penile discharge. No penile lesions or rashes. Scrotum without lesions, cysts, rashes and/or edema.  Testicles are located scrotally bilaterally. No masses are appreciated in the testicles. Left and right epididymis are normal. Rectal: Patient with  normal sphincter tone. Anus and perineum without scarring or rashes. No rectal masses are appreciated. Prostate is approximately  55 grams, could only palpate apex and midportion of glands, no nodules are appreciated. Seminal vesicles could not be palpated.   Lymph: No inguinal adenopathy. Neurologic: Grossly intact, no focal deficits, moving all 4 extremities. Psychiatric: Normal mood and affect.  Laboratory Data: Component     Latest Ref Rng & Units 05/16/2020  TSH     0.350 - 4.500 uIU/mL 1.291   Component     Latest Ref Rng & Units 05/16/2020  PTH, Intact     15 - 65 pg/mL 39  Calcium, Total (PTH)     8.7 - 10.2 mg/dL 9.4  PTH Interp      Comment   Component     Latest Ref Rng & Units 05/16/2020          Sodium     135 - 145 mmol/L 137  Potassium     3.5 - 5.1 mmol/L 4.8  Chloride     98 - 111 mmol/L 103  CO2     22 - 32 mmol/L 25  Glucose     70 - 99 mg/dL 122 (H)  BUN     6 - 20 mg/dL 16  Creatinine     0.61 -  1.24 mg/dL 0.96  Calcium     8.9 - 10.3 mg/dL 9.5  GFR, Estimated     >60 mL/min >60  Anion gap     5 - 15 9    Lab Results  Component Value Date   WBC 8.9 06/15/2020   HGB 14.5 06/15/2020   HCT 42.1 06/15/2020   MCV 98.4 06/15/2020   PLT 211 06/15/2020    Lab Results  Component Value Date   CREATININE 1.00 06/15/2020       Component Value Date/Time   CHOL 185 10/28/2019 0940   HDL 56 10/28/2019 0940   CHOLHDL 3.3 10/28/2019 0940   VLDL 45 (H) 10/28/2019 0940   LDLCALC 84 10/28/2019 0940    Urinalysis Component     Latest Ref Rng &  Units 05/27/2020  Specific Gravity, UA     1.005 - 1.030 1.010  pH, UA     5.0 - 7.5 6.5  Color, UA     Yellow Yellow  Appearance Ur     Clear Clear  Leukocytes,UA     Negative Negative  Protein,UA     Negative/Trace Negative  Glucose, UA     Negative Negative  Ketones, UA     Negative Negative  RBC, UA     Negative Trace (A)  Bilirubin, UA     Negative Negative  Urobilinogen, Ur     0.2 - 1.0 mg/dL 0.2  Nitrite, UA     Negative Negative  Microscopic Examination      See below:   Component     Latest Ref Rng & Units 05/27/2020  WBC, UA     0 - 5 /hpf 0-5  RBC     4.22 - 5.81 MIL/uL 0-2  Epithelial Cells (non renal)     0 - 10 /hpf 0-10  Casts     None seen /lpf Present (A)  Cast Type     N/A Granular casts (A)  Bacteria, UA     None seen/Few None seen  I have reviewed the labs.   Pertinent Imaging: CLINICAL DATA:  Kidney stones  EXAM: ABDOMEN - 1 VIEW  COMPARISON:  11/20/2019  FINDINGS: Negative for urinary tract calculi.  Normal bowel gas pattern. Aorta iliac stent graft noted. No skeletal abnormality.  IMPRESSION: Negative for urinary tract calculi.   Electronically Signed   By: Franchot Gallo M.D.   On: 05/28/2020 10:36 I have independently reviewed the films.  See HPI  Assessment & Plan:   1. Nephrolithiasis - no stones seen on KUB - serum calcium, PTH are normal  2. High risk hematuria - work up in 09/2019- Hypervascular prostate - UA negative for micro heme  3. BPH with LU TS - PSA pending - I PSS 3/1 - manage conservatively  4. ED - SHIM 5 - prescribed sildenafil 100 mg, on-demand-dosing to [employer], coupon given - patient will send MyChart message if he desires a refill - testosterone level check per AUA guidelines and patient states he has been on testosterone therapy in the past    Return in about 1 year (around 05/27/2021) for Follow up w/ PSA prior , IPSS, SHIM, UA and exam.  These notes generated with  voice recognition software. I apologize for typographical errors.  Zara Council, PA-C  [facility] [address]  [place], [place] [address] [phone]  I spent 15 minutes on the day of the encounter to include pre-visit record review, face-to-face time with  the patient, and post-visit ordering of tests.

## 2020-05-27 ENCOUNTER — Ambulatory Visit
Admission: RE | Admit: 2020-05-27 | Discharge: 2020-05-27 | Disposition: A | Discharge status: Home / Self Care | Payer: 59 | Source: Ambulatory Visit | Attending: Urology | Admitting: Urology

## 2020-05-27 ENCOUNTER — Ambulatory Visit (INDEPENDENT_AMBULATORY_CARE_PROVIDER_SITE_OTHER): Payer: 59 | Admitting: Urology

## 2020-05-27 ENCOUNTER — Other Ambulatory Visit: Payer: Self-pay

## 2020-05-27 ENCOUNTER — Encounter: Payer: Self-pay | Admitting: Urology

## 2020-05-27 VITALS — BP 154/91 | HR 75 | Ht 73.0 in | Wt 200.0 lb

## 2020-05-27 DIAGNOSIS — N138 Other obstructive and reflux uropathy: Secondary | ICD-10-CM

## 2020-05-27 DIAGNOSIS — N401 Enlarged prostate with lower urinary tract symptoms: Secondary | ICD-10-CM

## 2020-05-27 DIAGNOSIS — N200 Calculus of kidney: Secondary | ICD-10-CM | POA: Diagnosis not present

## 2020-05-27 DIAGNOSIS — N529 Male erectile dysfunction, unspecified: Secondary | ICD-10-CM | POA: Diagnosis not present

## 2020-05-27 DIAGNOSIS — R319 Hematuria, unspecified: Secondary | ICD-10-CM | POA: Diagnosis not present

## 2020-05-27 LAB — URINALYSIS, COMPLETE
Bilirubin, UA: NEGATIVE
Glucose, UA: NEGATIVE
Ketones, UA: NEGATIVE
Leukocytes,UA: NEGATIVE
Nitrite, UA: NEGATIVE
Protein,UA: NEGATIVE
Specific Gravity, UA: 1.01 (ref 1.005–1.030)
Urobilinogen, Ur: 0.2 mg/dL (ref 0.2–1.0)
pH, UA: 6.5 (ref 5.0–7.5)

## 2020-05-27 LAB — MICROSCOPIC EXAMINATION: Bacteria, UA: NONE SEEN

## 2020-05-27 MED ORDER — SILDENAFIL CITRATE 100 MG PO TABS: 100.0000 mg | ORAL_TABLET | Freq: Every day | ORAL | 0 refills | Status: DC | PRN

## 2020-05-27 NOTE — Patient Instructions (Signed)
Prostate Cancer Screening  Prostate cancer screening is a test that is done to check for the presence of prostate cancer in men. The prostate gland is a walnut-sized gland that is located below the bladder and in front of the rectum in males. The function of the prostate is to add fluid to semen during ejaculation. Prostate cancer is the second most common type of cancer in men. Who should have prostate cancer screening?  Screening recommendations vary based on age and other risk factors. Screening is recommended if:  You are older than age 55. If you are age 55-69, talk with your health care provider about your need for screening and how often screening should be done. Because most prostate cancers are slow growing and will not cause death, screening is generally reserved in this age group for men who have a 10-15-year life expectancy.  You are younger than age 55, and you have these risk factors: ? Being a black male or a male of African descent. ? Having a father, brother, or uncle who has been diagnosed with prostate cancer. The risk is higher if your family member's cancer occurred at an early age. Screening is not recommended if:  You are younger than age 40.  You are between the ages of 40 and 54 and you have no risk factors.  You are 70 years of age or older. At this age, the risks that screening can cause are greater than the benefits that it may provide. If you are at high risk for prostate cancer, your health care provider may recommend that you have screenings more often or that you start screening at a younger age. How is screening for prostate cancer done? The recommended prostate cancer screening test is a blood test called the prostate-specific antigen (PSA) test. PSA is a protein that is made in the prostate. As you age, your prostate naturally produces more PSA. Abnormally high PSA levels may be caused by:  Prostate cancer.  An enlarged prostate that is not caused by cancer  (benign prostatic hyperplasia, BPH). This condition is very common in older men.  A prostate gland infection (prostatitis). Depending on the PSA results, you may need more tests, such as:  A physical exam to check the size of your prostate gland.  Blood and imaging tests.  A procedure to remove tissue samples from your prostate gland for testing (biopsy). What are the benefits of prostate cancer screening?  Screening can help to identify cancer at an early stage, before symptoms start and when the cancer can be treated more easily.  There is a small chance that screening may lower your risk of dying from prostate cancer. The chance is small because prostate cancer is a slow-growing cancer, and most men with prostate cancer die from a different cause. What are the risks of prostate cancer screening? The main risk of prostate cancer screening is diagnosing and treating prostate cancer that would never have caused any symptoms or problems. This is called overdiagnosisand overtreatment. PSA screening cannot tell you if your PSA is high due to cancer or a different cause. A prostate biopsy is the only procedure to diagnose prostate cancer. Even the results of a biopsy may not tell you if your cancer needs to be treated. Slow-growing prostate cancer may not need any treatment other than monitoring, so diagnosing and treating it may cause unnecessary stress or other side effects. A prostate biopsy may also cause:  Infection or fever.  A false negative. This is   a result that shows that you do not have prostate cancer when you actually do have prostate cancer. Questions to ask your health care provider  When should I start prostate cancer screening?  What is my risk for prostate cancer?  How often do I need screening?  What type of screening tests do I need?  How do I get my test results?  What do my results mean?  Do I need treatment? Where to find more information  The American Cancer  Society: www.cancer.org  American Urological Association: www.auanet.org Contact a health care provider if:  You have difficulty urinating.  You have pain when you urinate or ejaculate.  You have blood in your urine or semen.  You have pain in your back or in the area of your prostate. Summary  Prostate cancer is a common type of cancer in men. The prostate gland is located below the bladder and in front of the rectum. This gland adds fluid to semen during ejaculation.  Prostate cancer screening may identify cancer at an early stage, when the cancer can be treated more easily.  The prostate-specific antigen (PSA) test is the recommended screening test for prostate cancer.  Discuss the risks and benefits of prostate cancer screening with your health care provider. If you are age 42 or older, the risks that screening can cause are greater than the benefits that it may provide. This information is not intended to replace advice given to you by your health care provider. Make sure you discuss any questions you have with your health care provider. Document Revised: 07/10/2019 Document Reviewed: 10/30/2018 Elsevier Patient Education  [place].

## 2020-05-28 LAB — TESTOSTERONE: Testosterone: 363 ng/dL (ref 264–916)

## 2020-05-28 LAB — PSA: Prostate Specific Ag, Serum: 0.7 ng/mL (ref 0.0–4.0)

## 2020-05-30 ENCOUNTER — Telehealth: Payer: Self-pay | Admitting: *Deleted

## 2020-05-30 NOTE — Telephone Encounter (Signed)
-----   Message from Nori Riis, PA-C sent at 05/29/2020  8:20 PM EST ----- Please let Mr. Beranek know that his PSA and testosterone levels were normal.

## 2020-05-30 NOTE — Telephone Encounter (Signed)
Notified patient as instructed, patient pleased. Discussed follow-up appointments, patient agrees  

## 2020-06-01 ENCOUNTER — Telehealth (HOSPITAL_COMMUNITY): Payer: Self-pay | Admitting: *Deleted

## 2020-06-01 NOTE — Telephone Encounter (Signed)
Reaching out to patient to offer assistance regarding upcoming cardiac imaging study; pt verbalizes understanding of appt date/time, parking situation and where to check in, pre-test NPO status and medications ordered, and verified current allergies; name and call back number provided for further questions should they arise  Roben Tatsch RN Navigator Cardiac Imaging [facility] [phone] office [phone] cell  

## 2020-06-02 ENCOUNTER — Ambulatory Visit
Admission: RE | Admit: 2020-06-02 | Discharge: 2020-06-02 | Disposition: A | Discharge status: Home / Self Care | Payer: 59 | Source: Ambulatory Visit | Attending: Cardiology | Admitting: Cardiology

## 2020-06-02 ENCOUNTER — Other Ambulatory Visit: Payer: Self-pay

## 2020-06-02 DIAGNOSIS — I2510 Atherosclerotic heart disease of native coronary artery without angina pectoris: Secondary | ICD-10-CM | POA: Diagnosis not present

## 2020-06-02 DIAGNOSIS — R072 Precordial pain: Secondary | ICD-10-CM | POA: Diagnosis not present

## 2020-06-02 MED ORDER — DILTIAZEM HCL 25 MG/5ML IV SOLN
10.0000 mg | Freq: Once | INTRAVENOUS | Status: AC
  Administered 2020-06-02: 10 mg via INTRAVENOUS

## 2020-06-02 MED ORDER — METOPROLOL TARTRATE 5 MG/5ML IV SOLN
10.0000 mg | Freq: Once | INTRAVENOUS | Status: AC
  Administered 2020-06-02: 10 mg via INTRAVENOUS

## 2020-06-02 MED ORDER — IOHEXOL 350 MG/ML SOLN
75.0000 mL | Freq: Once | INTRAVENOUS | Status: AC | PRN
  Administered 2020-06-02: 75 mL via INTRAVENOUS

## 2020-06-02 MED ORDER — NITROGLYCERIN 0.4 MG SL SUBL
0.8000 mg | SUBLINGUAL_TABLET | Freq: Once | SUBLINGUAL | Status: AC
  Administered 2020-06-02: 0.8 mg via SUBLINGUAL

## 2020-06-02 NOTE — Progress Notes (Signed)
Patient tolerated CT well. Drank water after. Vital signs stable encourage to drink water throughout day.Reasons explained and verbalized understanding. Ambulated steady gait.  

## 2020-06-02 NOTE — Progress Notes (Signed)
Patient states amlodipine (similar to cardizem) causes low sex drive and isn't an allergy, causes no allergic symptoms at all.

## 2020-06-03 DIAGNOSIS — I2510 Atherosclerotic heart disease of native coronary artery without angina pectoris: Secondary | ICD-10-CM | POA: Diagnosis not present

## 2020-06-06 ENCOUNTER — Telehealth: Payer: Self-pay

## 2020-06-06 NOTE — Telephone Encounter (Signed)
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 Continue Plavix and Praluent. Have patient follow-up in the office as soon as possible. We will likely need to schedule left heart cath.  Rescheduled patients appointment to this Friday 06/10/20. Patient verbalized understanding and agreed with plan.

## 2020-06-10 ENCOUNTER — Ambulatory Visit (INDEPENDENT_AMBULATORY_CARE_PROVIDER_SITE_OTHER): Payer: 59 | Admitting: Cardiology

## 2020-06-10 ENCOUNTER — Other Ambulatory Visit: Payer: Self-pay

## 2020-06-10 ENCOUNTER — Encounter: Payer: Self-pay | Admitting: Cardiology

## 2020-06-10 VITALS — BP 128/82 | HR 97 | Ht 72.0 in | Wt 206.0 lb

## 2020-06-10 DIAGNOSIS — E7800 Pure hypercholesterolemia, unspecified: Secondary | ICD-10-CM

## 2020-06-10 DIAGNOSIS — I10 Essential (primary) hypertension: Secondary | ICD-10-CM | POA: Diagnosis not present

## 2020-06-10 DIAGNOSIS — I2510 Atherosclerotic heart disease of native coronary artery without angina pectoris: Secondary | ICD-10-CM

## 2020-06-10 DIAGNOSIS — I739 Peripheral vascular disease, unspecified: Secondary | ICD-10-CM | POA: Diagnosis not present

## 2020-06-10 NOTE — Patient Instructions (Signed)
Medication Instructions:  Your physician recommends that you continue on your current medications as directed. Please refer to the Current Medication list given to you today.  *If you need a refill on your cardiac medications before your next appointment, please call your pharmacy*   Lab Work:  1.  Your physician recommends that you have labs drawn today or MON 06/13/20:     CBC, BMP  2.  You will need to get a COVID test done on MON. 06/13/20 (SEE HANDOUT)   Testing/Procedures:  You are scheduled for a Cardiac Catheterization on Tuesday, March 15 with Dr. Harrell Gave End.  1. Please arrive at the [facility] Entrance at 8:00 AM (Free valet parking service is available.)  Special note: Every effort is made to have your procedure done on time. Please understand that emergencies sometimes delay scheduled procedures.  2. Diet: Do not eat solid foods after midnight.  The patient may have clear liquids until 5am upon the day of the procedure.   4. Medication instructions in preparation for your procedure:  HOLD your Losartan the day of your procedure.    On the morning of your procedure, take your Plavix/Clopidogrel and any morning medicines NOT listed above.  You may use sips of water.  5. Plan for one night stay--bring personal belongings. 6. Bring a current list of your medications and current insurance cards. 7. You MUST have a responsible person to drive you home. 8. Someone MUST be with you the first 24 hours after you arrive home or your discharge will be delayed. 9. Please wear clothes that are easy to get on and off and wear slip-on shoes.  Thank you for allowing [place] to care for you!   -- [facility]   Follow-Up: At [facility], you and your health needs are our priority.  As part of our continuing mission to provide you with exceptional heart care, we have created designated Provider Care Teams.  These Care Teams include your primary  Cardiologist (physician) and Advanced Practice Providers (APPs -  Physician Assistants and Nurse Practitioners) who all work together to provide you with the care you need, when you need it.  We recommend signing up for the patient portal called "MyChart".  Sign up information is provided on this After Visit Summary.  MyChart is used to connect with patients for Virtual Visits (Telemedicine).  Patients are able to view lab/test results, encounter notes, upcoming appointments, etc.  Non-urgent messages can be sent to your provider as well.   To learn more about what you can do with MyChart, go to [email or web address].    Your next appointment:   2 month(s)  The format for your next appointment:   In Person  Provider:   Kate Sable, MD   Other Instructions

## 2020-06-10 NOTE — H&P (View-Only) (Signed)
Cardiology Office Note:    Date:  06/10/2020   ID:  [name], DOB 1960-10-22, MRN [id number]  PCP:  Abner Greenspan, MD  Cardiologist:  Kate Sable, MD  Electrophysiologist:  None   Referring MD: Abner Greenspan, MD   Chief Complaint  Patient presents with  . Other    Follow up post Cardiac CTA. Meds reviewed verbally with patient.     History of Present Illness:    Antonio Russell is a 60 y.o. male with a hx of hyperlipidemia, hypertension, former smoker x40+ years, AAA s/p endoprosthetic repair 08/2019,  peripheral arterial disease s/p R and L iliac stents, sleep apnea who presents for follow-up.   Previously seen for chest pain, coronary CTA ordered to evaluate presence of CAD.  He still complains of chest pain whenever he overexerts himself.  Also has claudication which is chronic.  Takes all his medications as prescribed, denies any bleeding.  Prior notes Echocardiogram 05/2020 showed normal systolic and diastolic function, EF 55 to 60%   Past Medical History:  Diagnosis Date  . Allergic rhinitis   . Allergy   . Anxiety   . Arthritis   . Back pain   . Chest pain 2012   cardiac cath - nonobstructive, nl LV fxn, rec aggressive med management  . GERD (gastroesophageal reflux disease)   . Headache(784.0)   . History of kidney stones   . HLD (hyperlipidemia)   . Hyperparathyroidism ([place])   . Hypertension   . Impotence of organic origin   . Other testicular hypofunction   . Peripheral vascular disease ([place])   . Personal history of urinary calculi   . Sleep apnea    no c-pap or bi-pap  . Thyroid disease    Hyperparathyroidism  . Tobacco use disorder   . Unspecified hearing loss     Past Surgical History:  Procedure Laterality Date  . CARDIAC CATHETERIZATION    . COLONOSCOPY    . ENDOVASCULAR REPAIR/STENT GRAFT N/A 08/12/2019   Procedure: ENDOVASCULAR REPAIR/STENT GRAFT;  Surgeon: Algernon Huxley, MD;  Location: [facility];  Service: Cardiovascular;   Laterality: N/A;  . EXTRACORPOREAL SHOCK WAVE LITHOTRIPSY Right 10/29/2019   Procedure: EXTRACORPOREAL SHOCK WAVE LITHOTRIPSY (ESWL);  Surgeon: Billey Co, MD;  Location: [facility];  Service: Urology;  Laterality: Right;  . HEMORRHOID SURGERY    . HEMORRHOID SURGERY    . KNEE SURGERY Right 2013  . NASAL SEPTUM SURGERY    . PARATHYROID EXPLORATION N/A 02/12/2020   Procedure: PARATHYROID EXPLORATION WITH PARATHROIDECTOMY;  Surgeon: Armandina Gemma, MD;  [facility];  Service: General;  Laterality: N/A;    Current Medications: Current Meds  Medication Sig  . cholecalciferol (VITAMIN D3) 25 MCG (1000 UNIT) tablet Take 1,000 Units by mouth daily.  . clopidogrel (PLAVIX) 75 MG tablet TAKE 1 TABLET (75 MG TOTAL) BY MOUTH DAILY AT 6 (SIX) AM.  . fexofenadine (ALLEGRA) 180 MG tablet Take 180 mg by mouth daily.   . fluticasone (FLONASE) 50 MCG/ACT nasal spray SPRAY 2 SPRAYS INTO EACH NOSTRIL EVERY DAY  . losartan (COZAAR) 50 MG tablet Take 1 tablet (50 mg total) by mouth daily.  . pantoprazole (PROTONIX) 40 MG tablet Take 1 tablet (40 mg total) by mouth daily.  [name] PRALUENT 75 MG/ML SOAJ Inject 75 mg into the skin every 14 (fourteen) days.  . sildenafil (VIAGRA) 100 MG tablet Take 1 tablet (100 mg total) by mouth daily as needed for erectile dysfunction.  Allergies:   Sertraline hcl, Amlodipine, Crestor [rosuvastatin calcium], Lipitor [atorvastatin calcium], and Zocor [simvastatin - high dose]   Social History   Socioeconomic History  . Marital status: Married    Spouse name: Not on file  . Number of children: Not on file  . Years of education: Not on file  . Highest education level: Not on file  Occupational History  . Not on file  Tobacco Use  . Smoking status: Former Smoker    Packs/day: 0.25    Years: 35.00    Pack years: 8.75    Types: Cigarettes    Quit date: 10/08/2019    Years since quitting: 0.6  . Smokeless tobacco: Former User    Types: Chew, Snuff  . Tobacco  comment: couple of months - 40 yrs ago  Vaping Use  . Vaping Use: Former  Substance and Sexual Activity  . Alcohol use: Yes    Alcohol/week: 4.0 - 5.0 standard drinks    Types: 4 - 5 Cans of beer per week    Comment: daily   . Drug use: No  . Sexual activity: Yes    Birth control/protection: None  Other Topics Concern  . Not on file  Social History Narrative   Coaches basketball   Social Determinants of Health   Financial Resource Strain: Not on file  Food Insecurity: Not on file  Transportation Needs: Not on file  Physical Activity: Not on file  Stress: Not on file  Social Connections: Not on file     Family History: The patient's family history includes Cancer in his father; Colon cancer in an other family member; Diabetes in his mother; Esophageal cancer in his paternal uncle; Heart disease in an other family member; Hyperlipidemia in his father and mother; Hypertension in his father, mother, and sister; Lung cancer in an other family member. There is no history of Stomach cancer or Rectal cancer.  ROS:   Please see the history of present illness.     All other systems reviewed and are negative.  EKGs/Labs/Other Studies Reviewed:    The following studies were reviewed today:   EKG:  EKG is  ordered today.  The ekg ordered today demonstrates sinus rhythm, normal ECG.  Recent Labs: 06/12/2019: Magnesium 2.2 10/26/2019: ALT 32 02/09/2020: Hemoglobin 14.7; Platelets 233 05/16/2020: BUN 16; Creatinine, Ser 0.96; Potassium 4.8; Sodium 137; TSH 1.291  Recent Lipid Panel    Component Value Date/Time   CHOL 185 10/28/2019 0940   TRIG 224 (H) 10/28/2019 0940   HDL 56 10/28/2019 0940   CHOLHDL 3.3 10/28/2019 0940   VLDL 45 (H) 10/28/2019 0940   LDLCALC 84 10/28/2019 0940   LDLDIRECT 225.0 05/01/2019 0938    Physical Exam:    VS:  BP 128/82 (BP Location: Left Arm, Patient Position: Sitting, Cuff Size: Normal)   Pulse 97   Ht 6' (1.829 m)   Wt 206 lb (93.4 kg)   SpO2  98%   BMI 27.94 kg/m     Wt Readings from Last 3 Encounters:  06/10/20 206 lb (93.4 kg)  05/27/20 200 lb (90.7 kg)  05/16/20 206 lb 6 oz (93.6 kg)     GEN:  Well nourished, well developed in no acute distress HEENT: Normal NECK: No JVD; No carotid bruits LYMPHATICS: No lymphadenopathy CARDIAC: RRR, no murmurs, rubs, gallops RESPIRATORY:  Clear to auscultation without rales, wheezing or rhonchi  ABDOMEN: Soft, non-tender, non-distended MUSCULOSKELETAL:  No edema; No deformity  SKIN: Warm and dry NEUROLOGIC:    Alert and oriented x 3 PSYCHIATRIC:  Normal affect   ASSESSMENT:    1. Coronary artery disease involving native coronary artery of native heart, unspecified whether angina present   2. Pure hypercholesterolemia   3. PAD (peripheral artery disease) ([place])   4. Essential hypertension    PLAN:     1. Precordial pain, persistent..  Echocardiogram showed normal systolic and diastolic function, EF 55 to 60%.  Coronary CTA showed calcium score 458, severe stenosis in the proximal left circumflex, moderate stenosis in the proximal RCA, CT FFR showed significant mid left circumflex stenosis.  Schedule patient for left heart cath. 2.  hyperlipidemia.  Intolerant to statins, continue Praluent.   3. PAD, AAA status post endovascular repair and iliac stents placement.  Continue Praluent, Plavix.  Follow-up with vascular surgery. 4. History of hypertension.  BP controlled.  Continue losartan.  Follow-up in 2 months  Total encounter time 40 minutes  Greater than 50% was spent in counseling and coordination of care with the patient  Shared Decision Making/Informed Consent The risks [stroke (1 in 1000), death (1 in 1000), kidney failure [usually temporary] (1 in 500), bleeding (1 in 200), allergic reaction [possibly serious] (1 in 200)], benefits (diagnostic support and management of coronary artery disease) and alternatives of a cardiac catheterization were discussed in detail with Mr.  Russell and he is willing to proceed.   Medication Adjustments/Labs and Tests Ordered: Current medicines are reviewed at length with the patient today.  Concerns regarding medicines are outlined above.  Orders Placed This Encounter  Procedures  . CBC  . Basic metabolic panel  . EKG 12-Lead   No orders of the defined types were placed in this encounter.   Patient Instructions  Medication Instructions:  Your physician recommends that you continue on your current medications as directed. Please refer to the Current Medication list given to you today.  *If you need a refill on your cardiac medications before your next appointment, please call your pharmacy*   Lab Work:  1.  Your physician recommends that you have labs drawn today or MON 06/13/20:     CBC, BMP  2.  You will need to get a COVID test done on MON. 06/13/20 (SEE HANDOUT)   Testing/Procedures:  You are scheduled for a Cardiac Catheterization on Tuesday, March 15 with Dr. Harrell Gave End.  1. Please arrive at the [place] Entrance at 8:00 AM (Free valet parking service is available.)  Special note: Every effort is made to have your procedure done on time. Please understand that emergencies sometimes delay scheduled procedures.  2. Diet: Do not eat solid foods after midnight.  The patient may have clear liquids until 5am upon the day of the procedure.   4. Medication instructions in preparation for your procedure:  HOLD your Losartan the day of your procedure.    On the morning of your procedure, take your Plavix/Clopidogrel and any morning medicines NOT listed above.  You may use sips of water.  5. Plan for one night stay--bring personal belongings. 6. Bring a current list of your medications and current insurance cards. 7. You MUST have a responsible person to drive you home. 8. Someone MUST be with you the first 24 hours after you arrive home or your discharge will be delayed. 9. Please wear clothes that are  easy to get on and off and wear slip-on shoes.  Thank you for allowing [place] to care for you!   -- [place] [facility]  Follow-Up: At [facility], you and your health needs are our priority.  As part of our continuing mission to provide you with exceptional heart care, we have created designated Provider Care Teams.  These Care Teams include your primary Cardiologist (physician) and Advanced Practice Providers (APPs -  Physician Assistants and Nurse Practitioners) who all work together to provide you with the care you need, when you need it.  We recommend signing up for the patient portal called "MyChart".  Sign up information is provided on this After Visit Summary.  MyChart is used to connect with patients for Virtual Visits (Telemedicine).  Patients are able to view lab/test results, encounter notes, upcoming appointments, etc.  Non-urgent messages can be sent to your provider as well.   To learn more about what you can do with MyChart, go to [email or web address].    Your next appointment:   2 month(s)  The format for your next appointment:   In Person  Provider:   Kate Sable, MD   Other Instructions      Signed, Kate Sable, MD  06/10/2020 4:58 PM    [place]

## 2020-06-10 NOTE — Progress Notes (Signed)
Cardiology Office Note:    Date:  06/10/2020   ID:  Antonio Russell, DOB [birth year], MRN [id number]  PCP:  Abner Greenspan, MD  Cardiologist:  Kate Sable, MD  Electrophysiologist:  None   Referring MD: Abner Greenspan, MD   Chief Complaint  Patient presents with  . Other    Follow up post Cardiac CTA. Meds reviewed verbally with patient.     History of Present Illness:    Antonio Russell is a 60 y.o. male with a hx of hyperlipidemia, hypertension, former smoker x40+ years, AAA s/p endoprosthetic repair 08/2019,  peripheral arterial disease s/p R and L iliac stents, sleep apnea who presents for follow-up.   Previously seen for chest pain, coronary CTA ordered to evaluate presence of CAD.  He still complains of chest pain whenever he overexerts himself.  Also has claudication which is chronic.  Takes all his medications as prescribed, denies any bleeding.  Prior notes Echocardiogram 05/2020 showed normal systolic and diastolic function, EF 55 to 60%   Past Medical History:  Diagnosis Date  . Allergic rhinitis   . Allergy   . Anxiety   . Arthritis   . Back pain   . Chest pain 2012   cardiac cath - nonobstructive, nl LV fxn, rec aggressive med management  . GERD (gastroesophageal reflux disease)   . Headache(784.0)   . History of kidney stones   . HLD (hyperlipidemia)   . Hyperparathyroidism ([place])   . Hypertension   . Impotence of organic origin   . Other testicular hypofunction   . Peripheral vascular disease ([place])   . Personal history of urinary calculi   . Sleep apnea    no c-pap or bi-pap  . Thyroid disease    Hyperparathyroidism  . Tobacco use disorder   . Unspecified hearing loss     Past Surgical History:  Procedure Laterality Date  . CARDIAC CATHETERIZATION    . COLONOSCOPY    . ENDOVASCULAR REPAIR/STENT GRAFT N/A 08/12/2019   Procedure: ENDOVASCULAR REPAIR/STENT GRAFT;  Surgeon: Algernon Huxley, MD;  Location: [place] [facility];  Service: Cardiovascular;   Laterality: N/A;  . EXTRACORPOREAL SHOCK WAVE LITHOTRIPSY Right 10/29/2019   Procedure: EXTRACORPOREAL SHOCK WAVE LITHOTRIPSY (ESWL);  Surgeon: Billey Co, MD;  Location: [facility];  Service: Urology;  Laterality: Right;  . HEMORRHOID SURGERY    . HEMORRHOID SURGERY    . KNEE SURGERY Right 2013  . NASAL SEPTUM SURGERY    . PARATHYROID EXPLORATION N/A 02/12/2020   Procedure: PARATHYROID EXPLORATION WITH PARATHROIDECTOMY;  Surgeon: Armandina Gemma, MD;  Location: [facility];  Service: General;  Laterality: N/A;    Current Medications: Current Meds  Medication Sig  . cholecalciferol (VITAMIN D3) 25 MCG (1000 UNIT) tablet Take 1,000 Units by mouth daily.  . clopidogrel (PLAVIX) 75 MG tablet TAKE 1 TABLET (75 MG TOTAL) BY MOUTH DAILY AT 6 (SIX) AM.  . fexofenadine (ALLEGRA) 180 MG tablet Take 180 mg by mouth daily.   . fluticasone (FLONASE) 50 MCG/ACT nasal spray SPRAY 2 SPRAYS INTO EACH NOSTRIL EVERY DAY  . losartan (COZAAR) 50 MG tablet Take 1 tablet (50 mg total) by mouth daily.  . pantoprazole (PROTONIX) 40 MG tablet Take 1 tablet (40 mg total) by mouth daily.  [name] PRALUENT 75 MG/ML SOAJ Inject 75 mg into the skin every 14 (fourteen) days.  . sildenafil (VIAGRA) 100 MG tablet Take 1 tablet (100 mg total) by mouth daily as needed for erectile dysfunction.  Allergies:   Sertraline hcl, Amlodipine, Crestor [rosuvastatin calcium], Lipitor [atorvastatin calcium], and Zocor [simvastatin - high dose]   Social History   Socioeconomic History  . Marital status: Married    Spouse name: Not on file  . Number of children: Not on file  . Years of education: Not on file  . Highest education level: Not on file  Occupational History  . Not on file  Tobacco Use  . Smoking status: Former Smoker    Packs/day: 0.25    Years: 35.00    Pack years: 8.75    Types: Cigarettes    Quit date: 10/08/2019    Years since quitting: 0.6  . Smokeless tobacco: Former Systems developer    Types: Chew, Snuff  . Tobacco  comment: couple of months - 40 yrs ago  Vaping Use  . Vaping Use: Former  Substance and Sexual Activity  . Alcohol use: Yes    Alcohol/week: 4.0 - 5.0 standard drinks    Types: 4 - 5 Cans of beer per week    Comment: daily   . Drug use: No  . Sexual activity: Yes    Birth control/protection: None  Other Topics Concern  . Not on file  Social History Narrative   Coaches basketball   Social Determinants of Health   Financial Resource Strain: Not on file  Food Insecurity: Not on file  Transportation Needs: Not on file  Physical Activity: Not on file  Stress: Not on file  Social Connections: Not on file     Family History: The patient's family history includes Cancer in his father; Colon cancer in an other family member; Diabetes in his mother; Esophageal cancer in his paternal uncle; Heart disease in an other family member; Hyperlipidemia in his father and mother; Hypertension in his father, mother, and sister; Lung cancer in an other family member. There is no history of Stomach cancer or Rectal cancer.  ROS:   Please see the history of present illness.     All other systems reviewed and are negative.  EKGs/Labs/Other Studies Reviewed:    The following studies were reviewed today:   EKG:  EKG is  ordered today.  The ekg ordered today demonstrates sinus rhythm, normal ECG.  Recent Labs: 06/12/2019: Magnesium 2.2 10/26/2019: ALT 32 02/09/2020: Hemoglobin 14.7; Platelets 233 05/16/2020: BUN 16; Creatinine, Ser 0.96; Potassium 4.8; Sodium 137; TSH 1.291  Recent Lipid Panel    Component Value Date/Time   CHOL 185 10/28/2019 0940   TRIG 224 (H) 10/28/2019 0940   HDL 56 10/28/2019 0940   CHOLHDL 3.3 10/28/2019 0940   VLDL 45 (H) 10/28/2019 0940   LDLCALC 84 10/28/2019 0940   LDLDIRECT 225.0 05/01/2019 0938    Physical Exam:    VS:  BP 128/82 (BP Location: Left Arm, Patient Position: Sitting, Cuff Size: Normal)   Pulse 97   Ht 6' (1.829 m)   Wt 206 lb (93.4 kg)   SpO2  98%   BMI 27.94 kg/m     Wt Readings from Last 3 Encounters:  06/10/20 206 lb (93.4 kg)  05/27/20 200 lb (90.7 kg)  05/16/20 206 lb 6 oz (93.6 kg)     GEN:  Well nourished, well developed in no acute distress HEENT: Normal NECK: No JVD; No carotid bruits LYMPHATICS: No lymphadenopathy CARDIAC: RRR, no murmurs, rubs, gallops RESPIRATORY:  Clear to auscultation without rales, wheezing or rhonchi  ABDOMEN: Soft, non-tender, non-distended MUSCULOSKELETAL:  No edema; No deformity  SKIN: Warm and dry NEUROLOGIC:  Alert and oriented x 3 PSYCHIATRIC:  Normal affect   ASSESSMENT:    1. Coronary artery disease involving native coronary artery of native heart, unspecified whether angina present   2. Pure hypercholesterolemia   3. PAD (peripheral artery disease) ([place])   4. Essential hypertension    PLAN:     1. Precordial pain, persistent..  Echocardiogram showed normal systolic and diastolic function, EF 55 to 60%.  Coronary CTA showed calcium score 458, severe stenosis in the proximal left circumflex, moderate stenosis in the proximal RCA, CT FFR showed significant mid left circumflex stenosis.  Schedule patient for left heart cath. 2.  hyperlipidemia.  Intolerant to statins, continue Praluent.   3. PAD, AAA status post endovascular repair and iliac stents placement.  Continue Praluent, Plavix.  Follow-up with vascular surgery. 4. History of hypertension.  BP controlled.  Continue losartan.  Follow-up in 2 months  Total encounter time 40 minutes  Greater than 50% was spent in counseling and coordination of care with the patient  Shared Decision Making/Informed Consent The risks [stroke (1 in 1000), death (1 in 1000), kidney failure [usually temporary] (1 in 500), bleeding (1 in 200), allergic reaction [possibly serious] (1 in 200)], benefits (diagnostic support and management of coronary artery disease) and alternatives of a cardiac catheterization were discussed in detail with Mr.  Lovelady and he is willing to proceed.   Medication Adjustments/Labs and Tests Ordered: Current medicines are reviewed at length with the patient today.  Concerns regarding medicines are outlined above.  Orders Placed This Encounter  Procedures  . CBC  . Basic metabolic panel  . EKG 12-Lead   No orders of the defined types were placed in this encounter.   Patient Instructions  Medication Instructions:  Your physician recommends that you continue on your current medications as directed. Please refer to the Current Medication list given to you today.  *If you need a refill on your cardiac medications before your next appointment, please call your pharmacy*   Lab Work:  1.  Your physician recommends that you have labs drawn today or MON 06/13/20:     CBC, BMP  2.  You will need to get a COVID test done on MON. 06/13/20 (SEE HANDOUT)   Testing/Procedures:  You are scheduled for a Cardiac Catheterization on Tuesday, March 15 with Dr. Harrell Gave End.  1. Please arrive at the [place] Entrance at 8:00 AM (Free valet parking service is available.)  Special note: Every effort is made to have your procedure done on time. Please understand that emergencies sometimes delay scheduled procedures.  2. Diet: Do not eat solid foods after midnight.  The patient may have clear liquids until 5am upon the day of the procedure.   4. Medication instructions in preparation for your procedure:  HOLD your Losartan the day of your procedure.    On the morning of your procedure, take your Plavix/Clopidogrel and any morning medicines NOT listed above.  You may use sips of water.  5. Plan for one night stay--bring personal belongings. 6. Bring a current list of your medications and current insurance cards. 7. You MUST have a responsible person to drive you home. 8. Someone MUST be with you the first 24 hours after you arrive home or your discharge will be delayed. 9. Please wear clothes that are  easy to get on and off and wear slip-on shoes.  Thank you for allowing [place] to care for you!   -- [place] [facility]  Follow-Up: At [facility], you and your health needs are our priority.  As part of our continuing mission to provide you with exceptional heart care, we have created designated Provider Care Teams.  These Care Teams include your primary Cardiologist (physician) and Advanced Practice Providers (APPs -  Physician Assistants and Nurse Practitioners) who all work together to provide you with the care you need, when you need it.  We recommend signing up for the patient portal called "MyChart".  Sign up information is provided on this After Visit Summary.  MyChart is used to connect with patients for Virtual Visits (Telemedicine).  Patients are able to view lab/test results, encounter notes, upcoming appointments, etc.  Non-urgent messages can be sent to your provider as well.   To learn more about what you can do with MyChart, go to [email or web address].    Your next appointment:   2 month(s)  The format for your next appointment:   In Person  Provider:   Kate Sable, MD   Other Instructions      Signed, Kate Sable, MD  06/10/2020 4:58 PM    [place]

## 2020-06-13 ENCOUNTER — Other Ambulatory Visit
Admission: RE | Admit: 2020-06-13 | Discharge: 2020-06-13 | Disposition: A | Discharge status: Home / Self Care | Payer: 59 | Attending: Cardiology | Admitting: Cardiology

## 2020-06-13 ENCOUNTER — Other Ambulatory Visit
Admission: RE | Admit: 2020-06-13 | Discharge: 2020-06-13 | Disposition: A | Discharge status: Home / Self Care | Payer: 59 | Source: Ambulatory Visit | Attending: Cardiology | Admitting: Cardiology

## 2020-06-13 ENCOUNTER — Other Ambulatory Visit: Payer: Self-pay

## 2020-06-13 ENCOUNTER — Other Ambulatory Visit: Payer: Self-pay | Admitting: *Deleted

## 2020-06-13 DIAGNOSIS — I2510 Atherosclerotic heart disease of native coronary artery without angina pectoris: Secondary | ICD-10-CM | POA: Insufficient documentation

## 2020-06-13 DIAGNOSIS — Z01812 Encounter for preprocedural laboratory examination: Secondary | ICD-10-CM | POA: Insufficient documentation

## 2020-06-13 DIAGNOSIS — Z20822 Contact with and (suspected) exposure to covid-19: Secondary | ICD-10-CM | POA: Insufficient documentation

## 2020-06-13 LAB — CBC
HCT: 43.8 % (ref 39.0–52.0)
Hemoglobin: 15 g/dL (ref 13.0–17.0)
MCH: 33.5 pg (ref 26.0–34.0)
MCHC: 34.2 g/dL (ref 30.0–36.0)
MCV: 97.8 fL (ref 80.0–100.0)
Platelets: 245 K/uL (ref 150–400)
RBC: 4.48 MIL/uL (ref 4.22–5.81)
RDW: 12.7 % (ref 11.5–15.5)
WBC: 9 K/uL (ref 4.0–10.5)
nRBC: 0 % (ref 0.0–0.2)

## 2020-06-13 LAB — BASIC METABOLIC PANEL
Anion gap: 8 (ref 5–15)
BUN: 18 mg/dL (ref 6–20)
CO2: 23 mmol/L (ref 22–32)
Calcium: 9 mg/dL (ref 8.9–10.3)
Chloride: 104 mmol/L (ref 98–111)
Creatinine, Ser: 1.24 mg/dL (ref 0.61–1.24)
GFR, Estimated: >60 mL/min (ref >60)
Glucose, Bld: 121 mg/dL — ABNORMAL HIGH (ref 70–99)
Potassium: 4.7 mmol/L (ref 3.5–5.1)
Sodium: 135 mmol/L (ref 135–145)

## 2020-06-13 LAB — SARS CORONAVIRUS 2 (TAT 6-24 HRS): SARS Coronavirus 2: NEGATIVE

## 2020-06-13 MED ORDER — PRALUENT 75 MG/ML ~~LOC~~ SOAJ: 75.0000 mg | SUBCUTANEOUS | 2 refills | Status: DC

## 2020-06-14 ENCOUNTER — Encounter
Admission: RE | Disposition: A | Discharge status: Home / Self Care | Payer: Self-pay | Source: Home / Self Care | Attending: Internal Medicine

## 2020-06-14 ENCOUNTER — Observation Stay
Admission: RE | Admit: 2020-06-14 | Discharge: 2020-06-15 | Disposition: A | Discharge status: Home / Self Care | Payer: 59 | Attending: Internal Medicine | Admitting: Internal Medicine

## 2020-06-14 ENCOUNTER — Other Ambulatory Visit: Payer: Self-pay

## 2020-06-14 DIAGNOSIS — I2089 Other forms of angina pectoris: Secondary | ICD-10-CM | POA: Diagnosis present

## 2020-06-14 DIAGNOSIS — R931 Abnormal findings on diagnostic imaging of heart and coronary circulation: Secondary | ICD-10-CM | POA: Diagnosis present

## 2020-06-14 DIAGNOSIS — I714 Abdominal aortic aneurysm, without rupture, unspecified: Secondary | ICD-10-CM | POA: Diagnosis present

## 2020-06-14 DIAGNOSIS — I208 Other forms of angina pectoris: Secondary | ICD-10-CM | POA: Diagnosis present

## 2020-06-14 DIAGNOSIS — I25118 Atherosclerotic heart disease of native coronary artery with other forms of angina pectoris: Secondary | ICD-10-CM | POA: Diagnosis not present

## 2020-06-14 DIAGNOSIS — R079 Chest pain, unspecified: Secondary | ICD-10-CM | POA: Diagnosis present

## 2020-06-14 DIAGNOSIS — I10 Essential (primary) hypertension: Secondary | ICD-10-CM | POA: Diagnosis not present

## 2020-06-14 DIAGNOSIS — I25119 Atherosclerotic heart disease of native coronary artery with unspecified angina pectoris: Secondary | ICD-10-CM | POA: Diagnosis not present

## 2020-06-14 DIAGNOSIS — Z79899 Other long term (current) drug therapy: Secondary | ICD-10-CM | POA: Diagnosis not present

## 2020-06-14 DIAGNOSIS — I2510 Atherosclerotic heart disease of native coronary artery without angina pectoris: Secondary | ICD-10-CM | POA: Diagnosis present

## 2020-06-14 DIAGNOSIS — E785 Hyperlipidemia, unspecified: Secondary | ICD-10-CM | POA: Diagnosis present

## 2020-06-14 DIAGNOSIS — F17200 Nicotine dependence, unspecified, uncomplicated: Secondary | ICD-10-CM | POA: Diagnosis present

## 2020-06-14 DIAGNOSIS — Z87891 Personal history of nicotine dependence: Secondary | ICD-10-CM | POA: Diagnosis not present

## 2020-06-14 HISTORY — PX: CORONARY STENT INTERVENTION: CATH118234

## 2020-06-14 HISTORY — PX: LEFT HEART CATH AND CORONARY ANGIOGRAPHY: CATH118249

## 2020-06-14 LAB — POCT ACTIVATED CLOTTING TIME: Activated Clotting Time: 297 seconds

## 2020-06-14 SURGERY — LEFT HEART CATH AND CORONARY ANGIOGRAPHY
Anesthesia: Moderate Sedation

## 2020-06-14 MED ORDER — NITROGLYCERIN 1 MG/10 ML FOR IR/CATH LAB
INTRA_ARTERIAL | Status: DC | PRN
  Administered 2020-06-14 (×2): 200 ug via INTRACORONARY

## 2020-06-14 MED ORDER — LORATADINE 10 MG PO TABS
10.0000 mg | ORAL_TABLET | Freq: Every day | ORAL | Status: DC
  Administered 2020-06-15: 10 mg via ORAL
  Filled 2020-06-14: qty 1

## 2020-06-14 MED ORDER — LIDOCAINE HCL (PF) 1 % IJ SOLN
INTRAMUSCULAR | Status: AC
  Filled 2020-06-14: qty 30

## 2020-06-14 MED ORDER — FLUTICASONE PROPIONATE 50 MCG/ACT NA SUSP
2.0000 | Freq: Every day | NASAL | Status: DC
  Administered 2020-06-15: 2 via NASAL
  Filled 2020-06-14: qty 16

## 2020-06-14 MED ORDER — FENTANYL CITRATE (PF) 100 MCG/2ML IJ SOLN
INTRAMUSCULAR | Status: DC | PRN
  Administered 2020-06-14: 50 ug via INTRAVENOUS
  Administered 2020-06-14: 25 ug via INTRAVENOUS

## 2020-06-14 MED ORDER — PANTOPRAZOLE SODIUM 40 MG PO TBEC
40.0000 mg | DELAYED_RELEASE_TABLET | Freq: Every day | ORAL | Status: DC
  Administered 2020-06-15: 40 mg via ORAL
  Filled 2020-06-14: qty 1

## 2020-06-14 MED ORDER — HYDRALAZINE HCL 20 MG/ML IJ SOLN: 10.0000 mg | INTRAMUSCULAR | Status: AC | PRN

## 2020-06-14 MED ORDER — SODIUM CHLORIDE 0.9% FLUSH: 3.0000 mL | Freq: Two times a day (BID) | INTRAVENOUS | Status: DC

## 2020-06-14 MED ORDER — ASPIRIN 81 MG PO CHEW
81.0000 mg | CHEWABLE_TABLET | Freq: Every day | ORAL | Status: DC
  Administered 2020-06-15: 81 mg via ORAL
  Filled 2020-06-14: qty 1

## 2020-06-14 MED ORDER — MIDAZOLAM HCL 2 MG/2ML IJ SOLN
INTRAMUSCULAR | Status: AC
  Filled 2020-06-14: qty 2

## 2020-06-14 MED ORDER — SODIUM CHLORIDE 0.9 % IV SOLN: INTRAVENOUS | Status: AC

## 2020-06-14 MED ORDER — METOPROLOL SUCCINATE ER 25 MG PO TB24
25.0000 mg | ORAL_TABLET | Freq: Every day | ORAL | Status: DC
  Administered 2020-06-15: 25 mg via ORAL
  Filled 2020-06-14: qty 1

## 2020-06-14 MED ORDER — LIDOCAINE HCL (PF) 1 % IJ SOLN
INTRAMUSCULAR | Status: DC | PRN
  Administered 2020-06-14: 2 mL

## 2020-06-14 MED ORDER — HEPARIN (PORCINE) IN NACL 1000-0.9 UT/500ML-% IV SOLN
INTRAVENOUS | Status: AC
  Filled 2020-06-14: qty 1000

## 2020-06-14 MED ORDER — MIDAZOLAM HCL 2 MG/2ML IJ SOLN
INTRAMUSCULAR | Status: DC | PRN
  Administered 2020-06-14 (×2): 1 mg via INTRAVENOUS

## 2020-06-14 MED ORDER — HEPARIN SODIUM (PORCINE) 1000 UNIT/ML IJ SOLN
INTRAMUSCULAR | Status: DC | PRN
  Administered 2020-06-14: 2000 [IU] via INTRAVENOUS
  Administered 2020-06-14 (×2): 4500 [IU] via INTRAVENOUS

## 2020-06-14 MED ORDER — SODIUM CHLORIDE 0.9 % IV SOLN: 250.0000 mL | INTRAVENOUS | Status: DC | PRN

## 2020-06-14 MED ORDER — CLOPIDOGREL BISULFATE 75 MG PO TABS
ORAL_TABLET | ORAL | Status: AC
  Filled 2020-06-14: qty 4

## 2020-06-14 MED ORDER — LABETALOL HCL 5 MG/ML IV SOLN: 10.0000 mg | INTRAVENOUS | Status: AC | PRN

## 2020-06-14 MED ORDER — IOHEXOL 300 MG/ML  SOLN
INTRAMUSCULAR | Status: DC | PRN
  Administered 2020-06-14: 78 mL

## 2020-06-14 MED ORDER — SODIUM CHLORIDE 0.9% FLUSH: 3.0000 mL | INTRAVENOUS | Status: DC | PRN

## 2020-06-14 MED ORDER — SODIUM CHLORIDE 0.9 % WEIGHT BASED INFUSION: 1.0000 mL/kg/h | INTRAVENOUS | Status: DC

## 2020-06-14 MED ORDER — VERAPAMIL HCL 2.5 MG/ML IV SOLN
INTRAVENOUS | Status: DC | PRN
  Administered 2020-06-14: 2.5 mg via INTRA_ARTERIAL

## 2020-06-14 MED ORDER — OXYCODONE-ACETAMINOPHEN 5-325 MG PO TABS
1.0000 | ORAL_TABLET | Freq: Four times a day (QID) | ORAL | Status: DC | PRN
  Administered 2020-06-14 – 2020-06-15 (×3): 1 via ORAL
  Filled 2020-06-14 (×2): qty 1

## 2020-06-14 MED ORDER — VERAPAMIL HCL 2.5 MG/ML IV SOLN
INTRAVENOUS | Status: AC
  Filled 2020-06-14: qty 2

## 2020-06-14 MED ORDER — OXYCODONE-ACETAMINOPHEN 5-325 MG PO TABS
ORAL_TABLET | ORAL | Status: AC
  Filled 2020-06-14: qty 1

## 2020-06-14 MED ORDER — CLOPIDOGREL BISULFATE 75 MG PO TABS
ORAL_TABLET | ORAL | Status: DC | PRN
  Administered 2020-06-14: 300 mg via ORAL

## 2020-06-14 MED ORDER — HEPARIN SODIUM (PORCINE) 1000 UNIT/ML IJ SOLN
INTRAMUSCULAR | Status: AC
  Filled 2020-06-14: qty 1

## 2020-06-14 MED ORDER — CLOPIDOGREL BISULFATE 75 MG PO TABS
75.0000 mg | ORAL_TABLET | Freq: Every day | ORAL | Status: DC
  Administered 2020-06-15: 75 mg via ORAL
  Filled 2020-06-14: qty 1

## 2020-06-14 MED ORDER — ASPIRIN 81 MG PO CHEW
81.0000 mg | CHEWABLE_TABLET | Freq: Once | ORAL | Status: DC
  Filled 2020-06-14: qty 1

## 2020-06-14 MED ORDER — ASPIRIN 81 MG PO CHEW
CHEWABLE_TABLET | ORAL | Status: AC
  Filled 2020-06-14: qty 1

## 2020-06-14 MED ORDER — LOSARTAN POTASSIUM 50 MG PO TABS
50.0000 mg | ORAL_TABLET | Freq: Every day | ORAL | Status: DC
  Administered 2020-06-15: 50 mg via ORAL
  Filled 2020-06-14: qty 1

## 2020-06-14 MED ORDER — SODIUM CHLORIDE 0.9% FLUSH
3.0000 mL | Freq: Two times a day (BID) | INTRAVENOUS | Status: DC
  Administered 2020-06-14 – 2020-06-15 (×2): 3 mL via INTRAVENOUS

## 2020-06-14 MED ORDER — SODIUM CHLORIDE 0.9 % WEIGHT BASED INFUSION: 3.0000 mL/kg/h | INTRAVENOUS | Status: DC

## 2020-06-14 MED ORDER — FENTANYL CITRATE (PF) 100 MCG/2ML IJ SOLN
INTRAMUSCULAR | Status: AC
  Filled 2020-06-14: qty 2

## 2020-06-14 MED ORDER — ACETAMINOPHEN 325 MG PO TABS
650.0000 mg | ORAL_TABLET | ORAL | Status: DC | PRN
  Administered 2020-06-14: 650 mg via ORAL
  Filled 2020-06-14: qty 2

## 2020-06-14 MED ORDER — HEPARIN (PORCINE) IN NACL 1000-0.9 UT/500ML-% IV SOLN
INTRAVENOUS | Status: DC | PRN
  Administered 2020-06-14: 1000 mL

## 2020-06-14 MED ORDER — ONDANSETRON HCL 4 MG/2ML IJ SOLN: 4.0000 mg | Freq: Four times a day (QID) | INTRAMUSCULAR | Status: DC | PRN

## 2020-06-14 SURGICAL SUPPLY — 19 items
BALLN EUPHORA RX 2.0X12 (BALLOONS) ×3
BALLN ~~LOC~~ TREK RX 2.75X8 (BALLOONS) ×3
BALLOON EUPHORA RX 2.0X12 (BALLOONS) IMPLANT
BALLOON ~~LOC~~ TREK RX 2.75X8 (BALLOONS) IMPLANT
CATH 5F 110X4 TIG (CATHETERS) ×1 IMPLANT
CATH LAUNCHER 6FR JR4 (CATHETERS) ×1 IMPLANT
COVER EZ STRL 42X30 (DRAPES) ×1 IMPLANT
DEVICE RAD TR BAND REGULAR (VASCULAR PRODUCTS) ×1 IMPLANT
DRAPE BRACHIAL (DRAPES) ×1 IMPLANT
GLIDESHEATH SLEND SS 6F .021 (SHEATH) ×1 IMPLANT
GUIDEWIRE INQWIRE 1.5J.035X260 (WIRE) IMPLANT
INQWIRE 1.5J .035X260CM (WIRE) ×3
KIT ENCORE 26 ADVANTAGE (KITS) ×1 IMPLANT
PACK CARDIAC CATH (CUSTOM PROCEDURE TRAY) ×3 IMPLANT
PROTECTION STATION PRESSURIZED (MISCELLANEOUS) ×3
SET ATX SIMPLICITY (MISCELLANEOUS) ×1 IMPLANT
STATION PROTECTION PRESSURIZED (MISCELLANEOUS) IMPLANT
STENT RESOLUTE ONYX 2.5X12 (Permanent Stent) ×1 IMPLANT
WIRE ASAHI PROWATER 180CM (WIRE) ×1 IMPLANT

## 2020-06-14 NOTE — Interval H&P Note (Signed)
History and Physical Interval Note:  06/14/2020 1:02 PM  Antonio Russell  has presented today for surgery, with the diagnosis of stable angina and abnormal cardiac CTA.  The various methods of treatment have been discussed with the patient and family. After consideration of risks, benefits and other options for treatment, the patient has consented to  Procedure(s): LEFT HEART CATH AND CORONARY ANGIOGRAPHY (Left) as a surgical intervention.  The patient's history has been reviewed, patient examined, no change in status, stable for surgery.  I have reviewed the patient's chart and labs.  Questions were answered to the patient's satisfaction.    Cath Lab Visit (complete for each Cath Lab visit)  Clinical Evaluation Leading to the Procedure:   ACS: No.  Non-ACS:    Anginal Classification: CCS III  Anti-ischemic medical therapy: No Therapy  Non-Invasive Test Results: Intermediate-risk stress test findings: cardiac mortality 1-3%/year (CTA with significant LCx disease that is CTFFR positive)  Prior CABG: No previous CABG  [name]

## 2020-06-14 NOTE — Brief Op Note (Signed)
BRIEF CARDIAC CATHETERIZATION NOTE  06/14/2020  2:01 PM  PATIENT:  Antonio Russell  60 y.o. male  PRE-OPERATIVE DIAGNOSIS:  Stable angina and abnormal cardiac CTA  POST-OPERATIVE DIAGNOSIS:  Same  PROCEDURE:  Procedure(s): LEFT HEART CATH AND CORONARY ANGIOGRAPHY (Left) CORONARY STENT INTERVENTION (N/A)  SURGEON:  Surgeon(s) and Role:    * Leylanie Woodmansee, Harrell Gave, MD - Primary  FINDINGS: 1. Two vessel CAD with 60-70% mid LCx and 95% mid RCA stenoses. 2. Normal LVEDP. 3. Successful PCI to mid RCA using Resolute Onyx 2.5 x 12 mm DES.  RECOMMENDATIONS: 1. DAPT with aspirin and clopidogrel for at least 6 months. 2. Start metoprolol succinate 25 mg daily for antianginal therapy.  Favor medical therapy of LCx stenosis, though this lesion is amenable to PCI if patient has refractory symptoms. 3. Aggressive secondary prevention.  Nelva Bush, MD [facility]

## 2020-06-14 NOTE — Plan of Care (Signed)
  Problem: Education: Goal: Knowledge of General Education information will improve Description: Including pain rating scale, medication(s)/side effects and non-pharmacologic comfort measures Outcome: Progressing   Problem: Health Behavior/Discharge Planning: Goal: Ability to manage health-related needs will improve Outcome: Progressing   Problem: Clinical Measurements: Goal: Ability to maintain clinical measurements within normal limits will improve Outcome: Progressing Goal: Will remain free from infection Outcome: Progressing Goal: Diagnostic test results will improve Outcome: Progressing Goal: Respiratory complications will improve Outcome: Progressing Goal: Cardiovascular complication will be avoided Outcome: Progressing   Problem: Coping: Goal: Level of anxiety will decrease Outcome: Progressing   Problem: Elimination: Goal: Will not experience complications related to bowel motility Outcome: Progressing Goal: Will not experience complications related to urinary retention Outcome: Progressing   Problem: Pain Managment: Goal: General experience of comfort will improve Outcome: Progressing   Problem: Safety: Goal: Ability to remain free from injury will improve Outcome: Progressing   

## 2020-06-15 ENCOUNTER — Encounter: Payer: Self-pay | Admitting: Internal Medicine

## 2020-06-15 DIAGNOSIS — I10 Essential (primary) hypertension: Secondary | ICD-10-CM | POA: Diagnosis not present

## 2020-06-15 DIAGNOSIS — Z720 Tobacco use: Secondary | ICD-10-CM | POA: Diagnosis not present

## 2020-06-15 DIAGNOSIS — I25119 Atherosclerotic heart disease of native coronary artery with unspecified angina pectoris: Secondary | ICD-10-CM | POA: Diagnosis not present

## 2020-06-15 DIAGNOSIS — I25118 Atherosclerotic heart disease of native coronary artery with other forms of angina pectoris: Secondary | ICD-10-CM | POA: Diagnosis not present

## 2020-06-15 LAB — CBC
HCT: 42.1 % (ref 39.0–52.0)
Hemoglobin: 14.5 g/dL (ref 13.0–17.0)
MCH: 33.9 pg (ref 26.0–34.0)
MCHC: 34.4 g/dL (ref 30.0–36.0)
MCV: 98.4 fL (ref 80.0–100.0)
Platelets: 211 10*3/uL (ref 150–400)
RBC: 4.28 MIL/uL (ref 4.22–5.81)
RDW: 12.8 % (ref 11.5–15.5)
WBC: 8.9 10*3/uL (ref 4.0–10.5)
nRBC: 0 % (ref 0.0–0.2)

## 2020-06-15 LAB — BASIC METABOLIC PANEL
Anion gap: 7 (ref 5–15)
BUN: 21 mg/dL — ABNORMAL HIGH (ref 6–20)
CO2: 24 mmol/L (ref 22–32)
Calcium: 9 mg/dL (ref 8.9–10.3)
Chloride: 106 mmol/L (ref 98–111)
Creatinine, Ser: 1 mg/dL (ref 0.61–1.24)
GFR, Estimated: >60 mL/min (ref >60)
Glucose, Bld: 109 mg/dL — ABNORMAL HIGH (ref 70–99)
Potassium: 4.5 mmol/L (ref 3.5–5.1)
Sodium: 137 mmol/L (ref 135–145)

## 2020-06-15 MED ORDER — NITROGLYCERIN 0.4 MG SL SUBL: 0.4000 mg | SUBLINGUAL_TABLET | SUBLINGUAL | 3 refills | Status: DC | PRN

## 2020-06-15 MED ORDER — ASPIRIN 81 MG PO CHEW: 81.0000 mg | CHEWABLE_TABLET | Freq: Every day | ORAL | Status: DC

## 2020-06-15 MED ORDER — METOPROLOL SUCCINATE ER 25 MG PO TB24: 25.0000 mg | ORAL_TABLET | Freq: Every day | ORAL | 6 refills | Status: DC

## 2020-06-15 NOTE — Discharge Summary (Signed)
Discharge Summary    Patient ID: Antonio Russell MRN: [id number]; DOB: 12/30/1960  Admit date: 06/14/2020 Discharge date: 06/15/2020  Primary Care Provider: Abner Greenspan, MD  Primary Cardiologist: Kate Sable, MD  Primary Electrophysiologist:  None   Discharge Diagnoses    Principal Problem:   Stable angina [facility])  **s/p PCI/DES to the RCA this admission. Active Problems:   CAD (coronary artery disease)   TOBACCO USE   Essential hypertension   Hyperlipidemia   AAA (abdominal aortic aneurysm) without rupture [facility])  Diagnostic Studies/Procedures    Cardiac Catheterization and Percutaneous Coronary Intervention 3.15.2022  Left Main  Vessel is large. Vessel is angiographically normal.  Left Anterior Descending  Vessel is moderate in size. There is mild diffuse disease throughout the vessel.  Mid LAD lesion is 50% stenosed. The lesion is eccentric.  First Diagonal Branch  Vessel is small in size.  Second Diagonal Branch  Vessel is small in size.  Third Diagonal Branch  Vessel is small in size.  Ramus Intermedius  Vessel is small. There is moderate diffuse disease throughout the vessel.  Left Circumflex  Prox Cx to Mid Cx lesion is 65% stenosed. The lesion is moderately calcified.  First Obtuse Marginal Branch  Vessel is small in size.  Second Obtuse Marginal Branch  Vessel is small in size.  Third Obtuse Marginal Branch  Vessel is moderate in size.  3rd Mrg lesion is 20% stenosed.  Fourth Obtuse Marginal Branch  Vessel is moderate in size.  Right Coronary Artery  Vessel is moderate in size.  Prox RCA to Mid RCA lesion is 30% stenosed.  Mid RCA lesion is 95% stenosed.      **The RCA was successfully stented using a 2.5 x 30mm Resolute Onyx DES**  First Right Posterolateral Branch  Second Right Posterolateral Branch  Vessel is small in size.  _____________   History of Present Illness     Antonio Russell is a 60 y.o. male with a h/o nonobstructive CAD, HTN,  HL, remote tobacco abuse, AAA s/p endovascular repair 08/2019, PAD s/p R and L iliac stenting, and OSA.  He was recently evaluated in cardiology clinic secondary to chest pain and underwent coronary CTA, which showed severe proximal LCX and moderate proximal RCA stenosis.  CT FFR of the LCX was abnormal.  As a result, he was scheduled for diagnostic catheterization.  Hospital Course     Consultants: None  Pt [facility] on 3/15 and underwent diagnostic catheterization revealing a 50% mid LAD stenosis, 65% proximal to mid LCX stenosis, and severe, 95% mid RCA stenosis.  The RCA was successfully treated with a 2.5 x 12 mm Resolute Onyx DES with recommendation for medical therapy of LAD/LCX disease.  If patient has recurrent chest pain, PCI of the LCX could be pursued, but he may be best served having this done at [facility] where atherectomy/lithotripsy is available.  Post-procedure, Mr. [name] has been ambulating without recurrent chest pain or dyspnea.  His right radial cath site feels well and is without bleeding, bruit, or hematoma.  He will be discharged home today in good condition.   blocker therapy has been added to his prior home regimen.  Did the patient have an acute coronary syndrome (MI, NSTEMI, STEMI, etc) this admission?:  Yes                               AHA/ACC Clinical  Performance & Quality Measures: 1. Aspirin prescribed? - Yes 2. ADP Receptor Inhibitor (Plavix/Clopidogrel, Brilinta/Ticagrelor or Effient/Prasugrel) prescribed (includes medically managed patients)? - Yes 3. Beta Blocker prescribed? - Yes 4. High Intensity Statin (Lipitor 40-80mg  or Crestor 20-40mg ) prescribed? - No - Intolerant - on PCSK9i @ home 5. EF assessed during THIS hospitalization? - No - Known normal EF by echo 05/2020 6. For EF <40%, was ACEI/ARB prescribed? - Not Applicable (EF >/= 67%) 7. For EF <40%, Aldosterone Antagonist (Spironolactone or Eplerenone) prescribed? - Not Applicable (EF  >/= 12%) 8. Cardiac Rehab Phase II ordered (including medically managed patients)? - Yes   _____________  Physical Exam   Discharge Vitals Blood pressure (!) 133/98, pulse 86, temperature 98.4 F (36.9 C), temperature source Oral, resp. rate 15, height 6' (1.829 m), weight 91 kg, SpO2 98 %.  Filed Weights   06/14/20 1757  Weight: 91 kg    GEN: Well nourished, well developed, in no acute distress.  HEENT: Grossly normal.  Neck: Supple, no JVD, carotid bruits, or masses. Cardiac: RRR, no murmurs, rubs, or gallops. No clubbing, cyanosis, edema.  Radials 2+ bilat. DP/PT 1+ and equal bilaterally. R radial cath site w/o bleeding/bruit/hematoma. Respiratory:  Respirations regular and unlabored, clear to auscultation bilaterally. GI: Soft, nontender, nondistended, BS + x 4. MS: no deformity or atrophy. Skin: warm and dry, no rash. Neuro:  Strength and sensation are intact. Psych: AAOx3.  Normal affect.  Labs & Radiologic Studies    CBC Recent Labs    06/13/20 0846 06/15/20 0654  WBC 9.0 8.9  HGB 15.0 14.5  HCT 43.8 42.1  MCV 97.8 98.4  PLT 245 458   Basic Metabolic Panel Recent Labs    06/13/20 0846 06/15/20 0654  NA 135 137  K 4.7 4.5  CL 104 106  CO2 23 24  GLUCOSE 121* 109*  BUN 18 21*  CREATININE 1.24 1.00  CALCIUM 9.0 9.0  _____________    Disposition   Pt is being discharged home today in good condition.  Follow-up Plans & Appointments     Follow-up Information    Loel Dubonnet, NP Follow up on 06/27/2020.   Specialty: Cardiology Why: 1:30 PM - Dr. Thereasa Solo Nurse Practitioner Contact information: [address] [place] [address] [phone]              Discharge Instructions    AMB Referral to Cardiac Rehabilitation - Phase II   Complete by: As directed    Diagnosis: Coronary Stents   After initial evaluation and assessments completed: Virtual Based Care may be provided alone or in conjunction with Phase 2  Cardiac Rehab based on patient barriers.: Yes   Call MD for:  redness, tenderness, or signs of infection (pain, swelling, redness, odor or green/yellow discharge around incision site)   Complete by: As directed    Call MD for:  severe uncontrolled pain   Complete by: As directed    Call MD for:  temperature >100.4   Complete by: As directed    Diet - low sodium heart healthy   Complete by: As directed    Increase activity slowly   Complete by: As directed       Discharge Medications   Allergies as of 06/15/2020      Reactions   Sertraline Hcl Other (See Comments)   Tremors   Amlodipine Other (See Comments)   Sexual side effects   Crestor [rosuvastatin Calcium] Other (See Comments)   Myalgia  Lipitor [atorvastatin Calcium] Other (See Comments)   Myalgia   Zocor [simvastatin - High Dose] Other (See Comments)   Myalgia      Medication List    STOP taking these medications   sildenafil 100 MG tablet Commonly known as: VIAGRA     TAKE these medications   aspirin 81 MG chewable tablet Chew 1 tablet (81 mg total) by mouth daily.   clopidogrel 75 MG tablet Commonly known as: PLAVIX TAKE 1 TABLET (75 MG TOTAL) BY MOUTH DAILY AT 6 (SIX) AM.   Dialyvite Vitamin D 5000 125 MCG (5000 UT) capsule Generic drug: Cholecalciferol Take 5,000 Units by mouth daily.   fexofenadine 180 MG tablet Commonly known as: ALLEGRA Take 180 mg by mouth daily.   fluticasone 50 MCG/ACT nasal spray Commonly known as: FLONASE SPRAY 2 SPRAYS INTO EACH NOSTRIL EVERY DAY What changed: See the new instructions.   losartan 50 MG tablet Commonly known as: COZAAR Take 1 tablet (50 mg total) by mouth daily.   metoprolol succinate 25 MG 24 hr tablet Commonly known as: TOPROL-XL Take 1 tablet (25 mg total) by mouth daily.   nitroGLYCERIN 0.4 MG SL tablet Commonly known as: Nitrostat Place 1 tablet (0.4 mg total) under the tongue every 5 (five) minutes as needed for chest pain.   pantoprazole  40 MG tablet Commonly known as: PROTONIX Take 1 tablet (40 mg total) by mouth daily.   Praluent 75 MG/ML Soaj Generic drug: Alirocumab Inject 75 mg into the skin every 14 (fourteen) days.         Outstanding Labs/Studies   None  Duration of Discharge Encounter   Greater than 30 minutes including physician time.  Signed, Murray Hodgkins, NP 06/15/2020, 8:53 AM

## 2020-06-15 NOTE — Discharge Instructions (Signed)
**PLEASE REMEMBER TO BRING ALL OF YOUR MEDICATIONS TO EACH OF YOUR FOLLOW-UP OFFICE VISITS.  NO HEAVY LIFTING OR SEXUAL ACTIVITY X 7 DAYS. NO DRIVING X 3-5 DAYS. NO SOAKING BATHS, HOT TUBS, POOLS, ETC., X 7 DAYS.   Radial Site Care Refer to this sheet in the next few weeks. These instructions provide you with information on caring for yourself after your procedure. Your caregiver may also give you more specific instructions. Your treatment has been planned according to current medical practices, but problems sometimes occur. Call your caregiver if you have any problems or questions after your procedure. HOME CARE INSTRUCTIONS  You may shower the day after the procedure.Remove the bandage (dressing) and gently wash the site with plain soap and water.Gently pat the site dry.   Do not apply powder or lotion to the site.   Do not submerge the affected site in water for 3 to 5 days.   Inspect the site at least twice daily.   Do not flex or bend the affected arm for 24 hours.   No lifting over 5 pounds (2.3 kg) for 5 days after your procedure.   Do not drive home if you are discharged the same day of the procedure. Have someone else drive you.   What to expect:  Any bruising will usually fade within 1 to 2 weeks.   Blood that collects in the tissue (hematoma) may be painful to the touch. It should usually decrease in size and tenderness within 1 to 2 weeks.  SEEK IMMEDIATE MEDICAL CARE IF:  You have unusual pain at the radial site.   You have redness, warmth, swelling, or pain at the radial site.   You have drainage (other than a small amount of blood on the dressing).   You have chills.   You have a fever or persistent symptoms for more than 72 hours.   You have a fever and your symptoms suddenly get worse.   Your arm becomes pale, cool, tingly, or numb.   You have heavy bleeding from the site. Hold pressure on the site.      10 Habits of Highly Healthy [place] wants to help you get well and stay well.  Live a longer, healthier life by practicing healthy habits every day.  1.  Visit your primary care provider regularly. 2.  Make time for family and friends.  Healthy relationships are important. 3.  Take medications as directed by your provider. 4.  Maintain a healthy weight and a trim waistline. 5.  Eat healthy meals and snacks, rich in fruits, vegetables, whole grains, and lean proteins. 6.  Get moving every day - aim for 150 minutes of moderate physical activity each week. 7.  Don't smoke. 8.  Avoid alcohol or drink in moderation. 9.  Manage stress through meditation or mindful relaxation. 10.  Get seven to nine hours of quality sleep each night.  Want more information on healthy habits?  To learn more about these and other healthy habits, visit [email or web address]. _____________       Antonio Russell have received care from [facility] during this hospital stay and we look forward to continuing to provide you with excellent care in our office settings after you've left the hospital.  In order to assure a smoother transition to home following your discharge from the hospital, we will likely have you see one of our nurse practitioners or physician assistants within a few weeks of discharge.  Our  advanced practice providers work closely with your physician in order to address all of your heart's needs in a timely manner.  More information about all of our providers may be found here: [email or web address]  Please plan to bring all of your prescriptions to your follow-up appointment and don't hesitate to contact us with questions or concerns.  [facility] - [phone] - [place] - [place] - [phone] [facility] [place] [facility] -  [phone] [facility] [phone] [place] [phone]

## 2020-06-15 NOTE — Progress Notes (Signed)
Antonio Russell to be D/C'd Home per MD order.  Discussed prescriptions and follow up appointments with the patient. Prescriptions electronically submitted, medication list explained in detail. Vascular discharge instruction gone over with patient and wife. Pt verbalized understanding.  Allergies as of 06/15/2020      Reactions   Sertraline Hcl Other (See Comments)   Tremors   Amlodipine Other (See Comments)   Sexual side effects   Crestor [rosuvastatin Calcium] Other (See Comments)   Myalgia   Lipitor [atorvastatin Calcium] Other (See Comments)   Myalgia   Zocor [simvastatin - High Dose] Other (See Comments)   Myalgia      Medication List    STOP taking these medications   sildenafil 100 MG tablet Commonly known as: VIAGRA     TAKE these medications   aspirin 81 MG chewable tablet Chew 1 tablet (81 mg total) by mouth daily.   clopidogrel 75 MG tablet Commonly known as: PLAVIX TAKE 1 TABLET (75 MG TOTAL) BY MOUTH DAILY AT 6 (SIX) AM.   Dialyvite Vitamin D 5000 125 MCG (5000 UT) capsule Generic drug: Cholecalciferol Take 5,000 Units by mouth daily.   fexofenadine 180 MG tablet Commonly known as: ALLEGRA Take 180 mg by mouth daily.   fluticasone 50 MCG/ACT nasal spray Commonly known as: FLONASE SPRAY 2 SPRAYS INTO EACH NOSTRIL EVERY DAY What changed: See the new instructions.   losartan 50 MG tablet Commonly known as: COZAAR Take 1 tablet (50 mg total) by mouth daily.   metoprolol succinate 25 MG 24 hr tablet Commonly known as: TOPROL-XL Take 1 tablet (25 mg total) by mouth daily.   nitroGLYCERIN 0.4 MG SL tablet Commonly known as: Nitrostat Place 1 tablet (0.4 mg total) under the tongue every 5 (five) minutes as needed for chest pain.   pantoprazole 40 MG tablet Commonly known as: PROTONIX Take 1 tablet (40 mg total) by mouth daily.   Praluent 75 MG/ML Soaj Generic drug: Alirocumab Inject 75 mg into the skin every 14 (fourteen) days.       Vitals:    06/15/20 0517 06/15/20 0815  BP: (!) 134/93 (!) 133/98  Pulse: 71 86  Resp: 20 15  Temp: 97.7 F (36.5 C) 98.4 F (36.9 C)  SpO2: 95% 98%    Skin clean, dry and intact without evidence of skin break down, no evidence of skin tears noted. IV catheter discontinued intact. Site without signs and symptoms of complications. Dressing and pressure applied. Pt denies pain at this time. No complaints noted.  An After Visit Summary was printed and given to the patient. Patient escorted via [place], and D/C home via private auto.  [place]

## 2020-06-24 ENCOUNTER — Encounter (INDEPENDENT_AMBULATORY_CARE_PROVIDER_SITE_OTHER): Payer: 59

## 2020-06-24 ENCOUNTER — Ambulatory Visit (INDEPENDENT_AMBULATORY_CARE_PROVIDER_SITE_OTHER): Payer: 59

## 2020-06-24 ENCOUNTER — Encounter (INDEPENDENT_AMBULATORY_CARE_PROVIDER_SITE_OTHER): Payer: Self-pay | Admitting: Vascular Surgery

## 2020-06-24 ENCOUNTER — Ambulatory Visit (INDEPENDENT_AMBULATORY_CARE_PROVIDER_SITE_OTHER): Payer: 59 | Admitting: Nurse Practitioner

## 2020-06-24 ENCOUNTER — Other Ambulatory Visit: Payer: Self-pay

## 2020-06-24 VITALS — BP 149/89 | HR 68 | Ht 72.0 in | Wt 205.0 lb

## 2020-06-24 DIAGNOSIS — I714 Abdominal aortic aneurysm, without rupture, unspecified: Secondary | ICD-10-CM

## 2020-06-24 DIAGNOSIS — I70213 Atherosclerosis of native arteries of extremities with intermittent claudication, bilateral legs: Secondary | ICD-10-CM | POA: Diagnosis not present

## 2020-06-24 DIAGNOSIS — E7800 Pure hypercholesterolemia, unspecified: Secondary | ICD-10-CM

## 2020-06-24 DIAGNOSIS — I10 Essential (primary) hypertension: Secondary | ICD-10-CM

## 2020-06-24 NOTE — Progress Notes (Signed)
Subjective:    Patient ID: Antonio Russell, male    DOB: 10/02/1960, 60 y.o.   MRN: [id number] Chief Complaint  Patient presents with  . Follow-up    6 Mo Evar   . Carotid    6 Mo evar     Antonio Russell is a 60 year old male returns today for noninvasive studies.The patient returns to the office for surveillance of an abdominal aortic aneurysm status post stent graft placement on 08/12/19.  The patient also had bilateral iliac artery stents placed at that time as well.  Patient denies abdominal pain or back pain, no other abdominal complaints. No groin related complaints. No symptoms consistent with distal embolization No changes in claudication distance.   Since the patient's most recent visit he has had a left heart catheterization with subsequent stent placement to the right coronary artery.  Patient denies amaurosis fugax or TIA symptoms. There is no history of claudication or rest pain symptoms of the lower extremities. The patient denies angina or shortness of breath.   Duplex US of the aorta and iliac arteries shows a 3.67 AAA sac with no endoleak, decrease in the sac compared to the previous study.  The right lower extremity has an ABI of 1.14 with a left of 1.12.  The previous ABIs on 12/22/2019 show a right ABI 1.12 with a left of 1.11.  The patient has triphasic tibial artery waveforms bilaterally with good toe waveforms.   Review of Systems  All other systems reviewed and are negative.      Objective:   Physical Exam Vitals reviewed.  HENT:     Head: Normocephalic.  Cardiovascular:     Rate and Rhythm: Normal rate.     Pulses: Normal pulses.  Pulmonary:     Effort: Pulmonary effort is normal.  Musculoskeletal:        General: Tenderness present.  Skin:    General: Skin is warm and dry.  Neurological:     Mental Status: He is alert and oriented to person, place, and time.  Psychiatric:        Mood and Affect: Mood normal.        Behavior: Behavior normal.         Thought Content: Thought content normal.        Judgment: Judgment normal.     BP (!) 149/89   Pulse 68   Ht 6' (1.829 m)   Wt 205 lb (93 kg)   BMI 27.80 kg/m   Past Medical History:  Diagnosis Date  . Allergic rhinitis   . Allergy   . Anxiety   . Arthritis   . Back pain   . CAD (coronary artery disease)    a. 2012 Cath: nonobstructive, nl LV fxn, rec aggressive med management; b. 05/2020 Cath/PCI: LM nl, LAD 56m, RI mod diff dzs, LCX 65p/m, OM3 20, RCA 30p, 31m (2.5x12 Resolute Onyx DES).  . GERD (gastroesophageal reflux disease)   . Headache(784.0)   . History of kidney stones   . HLD (hyperlipidemia)   . Hyperparathyroidism ([place])   . Hypertension   . Impotence of organic origin   . Other testicular hypofunction   . Peripheral vascular disease ([place])   . Personal history of urinary calculi   . Sleep apnea    no c-pap or bi-pap  . Thyroid disease    Hyperparathyroidism  . Tobacco use disorder   . Unspecified hearing loss     Social History   Socioeconomic History  .  Marital status: Married    Spouse name: Not on file  . Number of children: Not on file  . Years of education: Not on file  . Highest education level: Not on file  Occupational History  . Not on file  Tobacco Use  . Smoking status: Former Smoker    Packs/day: 0.25    Years: 35.00    Pack years: 8.75    Types: Cigarettes    Quit date: 10/08/2019    Years since quitting: 0.7  . Smokeless tobacco: Former Systems developer    Types: Chew, Snuff  . Tobacco comment: couple of months - 40 yrs ago  Vaping Use  . Vaping Use: Former  Substance and Sexual Activity  . Alcohol use: Yes    Alcohol/week: 4.0 - 5.0 standard drinks    Types: 4 - 5 Cans of beer per week    Comment: daily   . Drug use: No  . Sexual activity: Yes    Birth control/protection: None  Other Topics Concern  . Not on file  Social History Narrative   Coaches basketball   Social Determinants of Health   Financial Resource Strain: Not on  file  Food Insecurity: Not on file  Transportation Needs: Not on file  Physical Activity: Not on file  Stress: Not on file  Social Connections: Not on file  Intimate Partner Violence: Not on file    Past Surgical History:  Procedure Laterality Date  . CARDIAC CATHETERIZATION    . COLONOSCOPY    . CORONARY STENT INTERVENTION N/A 06/14/2020   Procedure: CORONARY STENT INTERVENTION;  Surgeon: Nelva Bush, MD;  Location: [place] [facility];  Service: Cardiovascular;  Laterality: N/A;  . ENDOVASCULAR REPAIR/STENT GRAFT N/A 08/12/2019   Procedure: ENDOVASCULAR REPAIR/STENT GRAFT;  Surgeon: Algernon Huxley, MD;  Location: [place] [facility];  Service: Cardiovascular;  Laterality: N/A;  . EXTRACORPOREAL SHOCK WAVE LITHOTRIPSY Right 10/29/2019   Procedure: EXTRACORPOREAL SHOCK WAVE LITHOTRIPSY (ESWL);  Surgeon: Billey Co, MD;  Location: [facility];  Service: Urology;  Laterality: Right;  . HEMORRHOID SURGERY    . HEMORRHOID SURGERY    . KNEE SURGERY Right 2013  . LEFT HEART CATH AND CORONARY ANGIOGRAPHY Left 06/14/2020   Procedure: LEFT HEART CATH AND CORONARY ANGIOGRAPHY;  Surgeon: Nelva Bush, MD;  Location: [place] [facility];  Service: Cardiovascular;  Laterality: Left;  . NASAL SEPTUM SURGERY    . PARATHYROID EXPLORATION N/A 02/12/2020   Procedure: PARATHYROID EXPLORATION WITH PARATHROIDECTOMY;  Surgeon: Armandina Gemma, MD;  Location: [facility];  Service: General;  Laterality: N/A;    Family History  Problem Relation Age of Onset  . Hypertension Mother   . Hyperlipidemia Mother   . Diabetes Mother   . Hypertension Father   . Hyperlipidemia Father   . Cancer Father        throat  . Hypertension Sister   . Lung cancer Other        GF  . Colon cancer Other        dx "close to 48"  . Heart disease Other        GM  . Esophageal cancer Paternal Uncle   . Stomach cancer Neg Hx   . Rectal cancer Neg Hx     Allergies  Allergen Reactions  . Sertraline Hcl Other (See  Comments)    Tremors  . Amlodipine Other (See Comments)    Sexual side effects  . Crestor [Rosuvastatin Calcium] Other (See Comments)    Myalgia  .  Lipitor [Atorvastatin Calcium] Other (See Comments)    Myalgia  . Zocor [Simvastatin - High Dose] Other (See Comments)    Myalgia     CBC Latest Ref Rng & Units 06/15/2020 06/13/2020 02/09/2020  WBC 4.0 - 10.5 K/uL 8.9 9.0 8.8  Hemoglobin 13.0 - 17.0 g/dL 14.5 15.0 14.7  Hematocrit 39.0 - 52.0 % 42.1 43.8 43.9  Platelets 150 - 400 K/uL 211 245 233      CMP     Component Value Date/Time   NA 137 06/15/2020 0654   K 4.5 06/15/2020 0654   CL 106 06/15/2020 0654   CO2 24 06/15/2020 0654   GLUCOSE 109 (H) 06/15/2020 0654   BUN 21 (H) 06/15/2020 0654   CREATININE 1.00 06/15/2020 0654   CREATININE 0.94 06/12/2019 0852   CALCIUM 9.0 06/15/2020 0654   CALCIUM 9.4 05/16/2020 1000   PROT 7.2 10/26/2019 1155   ALBUMIN 3.9 10/26/2019 1155   AST 28 10/26/2019 1155   ALT 32 10/26/2019 1155   ALKPHOS 102 10/26/2019 1155   BILITOT 0.8 10/26/2019 1155   GFRNONAA >60 06/15/2020 0654   GFRNONAA 89 06/12/2019 0852   GFRAA >60 10/26/2019 1155   GFRAA 103 06/12/2019 0852     No results found.     Assessment & Plan:   1. AAA (abdominal aortic aneurysm) without rupture (HCC) Recommend: Patient is status post successful endovascular repair of the AAA.   No further intervention is required at this time.   No endoleak is detected and the aneurysm sac is stable.  The patient will continue antiplatelet therapy as prescribed as well as aggressive management of hyperlipidemia. Exercise is again strongly encouraged.   However, endografts require continued surveillance with ultrasound or CT scan. This is mandatory to detect any changes that allow repressurization of the aneurysm sac.  The patient is informed that this would be asymptomatic.  The patient is reminded that lifelong routine surveillance is a necessity with an endograft. Patient will  continue to follow-up at 12 month intervals with ultrasound of the aorta.  2. Atherosclerosis of native artery of both lower extremities with intermittent claudication (HCC)  Recommend:  The patient has evidence of atherosclerosis of the lower extremities with claudication.  The patient does not voice lifestyle limiting changes at this point in time.  Noninvasive studies do not suggest clinically significant change.  No invasive studies, angiography or surgery at this time The patient should continue walking and begin a more formal exercise program.  The patient should continue antiplatelet therapy and aggressive treatment of the lipid abnormalities  No changes in the patient's medications at this time  The patient should continue wearing graduated compression socks 10-15 mmHg strength to control the mild edema.    3. Essential hypertension Continue antihypertensive medications as already ordered, these medications have been reviewed and there are no changes at this time.   4. Pure hypercholesterolemia Continue statin as ordered and reviewed, no changes at this time    Current Outpatient Medications on File Prior to Visit  Medication Sig Dispense Refill  . aspirin 81 MG chewable tablet Chew 1 tablet (81 mg total) by mouth daily.    . Cholecalciferol (DIALYVITE VITAMIN D 5000) 125 MCG (5000 UT) capsule Take 5,000 Units by mouth daily.    . clopidogrel (PLAVIX) 75 MG tablet TAKE 1 TABLET (75 MG TOTAL) BY MOUTH DAILY AT 6 (SIX) AM. 90 tablet 1  . fexofenadine (ALLEGRA) 180 MG tablet Take 180 mg by mouth daily.     [name]  fluticasone (FLONASE) 50 MCG/ACT nasal spray SPRAY 2 SPRAYS INTO EACH NOSTRIL EVERY DAY (Patient taking differently: Place 2 sprays into both nostrils daily.) 16 g 5  . losartan (COZAAR) 50 MG tablet Take 1 tablet (50 mg total) by mouth daily. 30 tablet 5  . metoprolol succinate (TOPROL-XL) 25 MG 24 hr tablet Take 1 tablet (25 mg total) by mouth daily. 30 tablet 6  .  nitroGLYCERIN (NITROSTAT) 0.4 MG SL tablet Place 1 tablet (0.4 mg total) under the tongue every 5 (five) minutes as needed for chest pain. 25 tablet 3  . pantoprazole (PROTONIX) 40 MG tablet Take 1 tablet (40 mg total) by mouth daily. 90 tablet 3  . PRALUENT 75 MG/ML SOAJ Inject 75 mg into the skin every 14 (fourteen) days. 2 mL 2   No current facility-administered medications on file prior to visit.    There are no Patient Instructions on file for this visit. No follow-ups on file.   Kris Hartmann, NP

## 2020-06-27 ENCOUNTER — Encounter: Payer: Self-pay | Admitting: Family

## 2020-06-27 ENCOUNTER — Other Ambulatory Visit: Payer: Self-pay

## 2020-06-27 ENCOUNTER — Ambulatory Visit (INDEPENDENT_AMBULATORY_CARE_PROVIDER_SITE_OTHER): Payer: 59 | Admitting: Family

## 2020-06-27 VITALS — BP 122/72 | HR 77 | Ht 72.0 in | Wt 205.0 lb

## 2020-06-27 DIAGNOSIS — I739 Peripheral vascular disease, unspecified: Secondary | ICD-10-CM

## 2020-06-27 DIAGNOSIS — I25118 Atherosclerotic heart disease of native coronary artery with other forms of angina pectoris: Secondary | ICD-10-CM | POA: Diagnosis not present

## 2020-06-27 DIAGNOSIS — I10 Essential (primary) hypertension: Secondary | ICD-10-CM | POA: Diagnosis not present

## 2020-06-27 DIAGNOSIS — E785 Hyperlipidemia, unspecified: Secondary | ICD-10-CM | POA: Diagnosis not present

## 2020-06-27 MED ORDER — METOPROLOL SUCCINATE ER 25 MG PO TB24
12.5000 mg | ORAL_TABLET | Freq: Every day | ORAL | 6 refills | Status: DC
Start: 2020-06-27 — End: 2021-05-22

## 2020-06-27 NOTE — Patient Instructions (Addendum)
Medication Instructions:  Your physician has recommended you make the following change in your medication:   CHANGE Metoprolol Succinate (Toprol) to 12.5mg  (half tablet) once daily  *If you need a refill on your cardiac medications before your next appointment, please call your pharmacy*  Lab Work: Your provider recommends that you return for FASTING lab work at your convenience prior to your upcoming appointment with Dr. [name] at the [facility] for fasting lipid panel and [place] Entr[facility] at [facility] 1st desk on the right to check in, past the screening table Lab hours: Monday- Friday (7:30 am- 5:30 pm)   Do not eat/ drink anything for 8 hours prior to your lab draw except for black coffee/ water   If you have labs (blood work) drawn today and your tests are completely normal, you will receive your results only by: [name] MyChart Message (if you have MyChart) OR . A paper copy in the mail If you have any lab test that is abnormal or we need to change your treatment, we will call you to review the results.   Testing/Procedures: Your EKG today shows normal sinus rhythm.   Follow-Up: At [facility], you and your health needs are our priority.  As part of our continuing mission to provide you with exceptional heart care, we have created designated Provider Care Teams.  These Care Teams include your primary Cardiologist (physician) and Advanced Practice Providers (APPs -  Physician Assistants and Nurse Practitioners) who all work together to provide you with the care you need, when you need it.  We recommend signing up for the patient portal called "MyChart".  Sign up information is provided on this After Visit Summary.  MyChart is used to connect with patients for Virtual Visits (Telemedicine).  Patients are able to view lab/test results, encounter notes, upcoming appointments, etc.  Non-urgent messages can be sent to your provider as well.   To learn more about what you can  do with MyChart, go to [email or web address].    Your next appointment:   As scheduled with Dr. [name]   Other Instructions  Heart Healthy Diet Recommendations: A low-salt diet is recommended. Meats should be grilled, baked, or boiled. Avoid fried foods. Focus on lean protein sources like fish or chicken with vegetables and fruits. The American Heart Association is a Microbiologist!  American Heart Association Diet and Lifeystyle Recommendations   Exercise recommendations: The American Heart Association recommends 150 minutes of moderate intensity exercise weekly. Try 30 minutes of moderate intensity exercise 4-5 times per week. This could include walking, jogging, or swimming.

## 2020-06-27 NOTE — Progress Notes (Signed)
Office Visit    Patient Name: Antonio Russell Date of Encounter: 06/27/2020  PCP:  Abner Greenspan, MD   [name]  Cardiologist:  Kate Sable, MD  Advanced Practice Provider:  No care team member to display Electrophysiologist:  None    Chief Complaint    Antonio Russell is a 60 y.o. male with a hx of CAD, HTN, HLD, PAD w/p R and L iliac stent, AAA s/p endovasular repair 08/2019, OSA presents today for follow up after cardiac catheterization   Past Medical History    Past Medical History:  Diagnosis Date  . Allergic rhinitis   . Allergy   . Anxiety   . Arthritis   . Back pain   . CAD (coronary artery disease)    a. 2012 Cath: nonobstructive, nl LV fxn, rec aggressive med management; b. 05/2020 Cath/PCI: LM nl, LAD 108m, RI mod diff dzs, LCX 65p/m, OM3 20, RCA 30p, 53m (2.5x12 Resolute Onyx DES).  . GERD (gastroesophageal reflux disease)   . Headache(784.0)   . History of kidney stones   . HLD (hyperlipidemia)   . Hyperparathyroidism ([place])   . Hypertension   . Impotence of organic origin   . Other testicular hypofunction   . Peripheral vascular disease ([place])   . Personal history of urinary calculi   . Sleep apnea    no c-pap or bi-pap  . Thyroid disease    Hyperparathyroidism  . Tobacco use disorder   . Unspecified hearing loss    Past Surgical History:  Procedure Laterality Date  . CARDIAC CATHETERIZATION    . COLONOSCOPY    . CORONARY STENT INTERVENTION N/A 06/14/2020   Procedure: CORONARY STENT INTERVENTION;  Surgeon: Nelva Bush, MD;  Location: [place] [facility];  Service: Cardiovascular;  Laterality: N/A;  . ENDOVASCULAR REPAIR/STENT GRAFT N/A 08/12/2019   Procedure: ENDOVASCULAR REPAIR/STENT GRAFT;  Surgeon: Algernon Huxley, MD;  Location: [place] [facility];  Service: Cardiovascular;  Laterality: N/A;  . EXTRACORPOREAL SHOCK WAVE LITHOTRIPSY Right 10/29/2019   Procedure: EXTRACORPOREAL SHOCK WAVE LITHOTRIPSY (ESWL);  Surgeon:  Billey Co, MD;  Location: [facility];  Service: Urology;  Laterality: Right;  . HEMORRHOID SURGERY    . HEMORRHOID SURGERY    . KNEE SURGERY Right 2013  . LEFT HEART CATH AND CORONARY ANGIOGRAPHY Left 06/14/2020   Procedure: LEFT HEART CATH AND CORONARY ANGIOGRAPHY;  Surgeon: Nelva Bush, MD;  Location: [place] [facility];  Service: Cardiovascular;  Laterality: Left;  . NASAL SEPTUM SURGERY    . PARATHYROID EXPLORATION N/A 02/12/2020   Procedure: PARATHYROID EXPLORATION WITH PARATHROIDECTOMY;  Surgeon: Armandina Gemma, MD;  Location: [facility];  Service: General;  Laterality: N/A;    Allergies  Allergies  Allergen Reactions  . Sertraline Hcl Other (See Comments)    Tremors  . Amlodipine Other (See Comments)    Sexual side effects  . Crestor [Rosuvastatin Calcium] Other (See Comments)    Myalgia  . Lipitor [Atorvastatin Calcium] Other (See Comments)    Myalgia  . Zocor [Simvastatin - High Dose] Other (See Comments)    Myalgia     History of Present Illness    Antonio Russell is a 60 y.o. male with a hx of CAD, HTN, HLD, PAD w/p R and L iliac stent, AAA s/p endovasular repair 08/2019, OSA last seen for cardiac catheterization 06/14/20.  At clinic visit with Dr. [name] 04/25/20 he noted mild chest pressure which had been ongoing since parathyroid surgery  2 months prior. He was recommended for echo. Echo 05/12/2020 EF 55-60%, no wall motion abnormalities, normal diastolic parameters, RV normal size and function, no significant valvular abnormalities.. At follow up 05/16/20 he noted anogasmia which improved with transition from Metoprolol to Losartan. He was recommended for cardiac CTA performed 06/02/20 showing coronary calcium score of 458 which was 92nd percentile for age/sex matched control. Severe stenosis of the prox LCx was noted as well as moderate RCA stenosis with recommendation for LHC. LHC performed 06/14/20 showed multivessel CAD with 50% mid LAD, 60-70% prox/mid LCx, 95% mid  RCA. Underwent PCI/DES to mid RCA. Recommended for DAPT for at least 6 months, medical therapy of LCx and LAD. Metoprolol Succinate was added for antianginal therapy. Noted that if he had continued chest pain refractory to at least 2 antianginal agents PCI to LCx could be considered.   Presents today for follow-up.  Reports right radial catheterization site is healing well.  Reports no chest pain, pressure, tightness.  Reports no shortness of breath at rest or dyspnea on exertion.  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  Does note some recurrent anorgasmia since resumption of Toprol 25mg  despite lowered dose.   EKGs/Labs/Other Studies Reviewed:   The following studies were reviewed today: Cardiac CTA 06/02/20 IMPRESSION: 1. Coronary calcium score of 458. This was 92nd percentile for age and sex matched control.   2. Normal coronary origin with right dominance.   3. Severe stenosis in the proximal LCx   4. Moderate stenosis in the proximal RCA   5. Mild stenosis in the mid LAD   6. CAD-RADS 4 Severe stenosis. (70-99% or > 50% left main). Cardiac catheterization or CT FFR is recommended. Consider symptom-guided anti-ischemic pharmacotherapy as well as risk factor modification per guideline directed care. Additional analysis with CT FFR will be submitted and reported separately.  LHC 06/14/20 Conclusions: 1. Multivessel coronary artery disease including 50% mid LAD stenosis, 60 to 70% proximal/mid LCx disease, and 95% mid RCA lesion. 2. Normal left ventricular filling pressure. 3. Successful PCI to mid RCA using Resolute Onyx 2.5 x 12 mm drug-eluting stent with 0% residual stenosis and TIMI-3 flow.   Recommendations: 1. Dual antiplatelet therapy with aspirin and clopidogrel for at least 6 months. 2. Aggressive secondary prevention. 3. Medical therapy of LCx and LAD disease.  I will add metoprolol succinate for antianginal therapy.  If the patient  has continued chest pain refractory to maximal tolerated doses of at least 2 antianginal agents, PCI to LCx could be considered.  If LCx PCI is pursued, it may be best to perform this at [facility] in case atherectomy/lithotripsy is necessary for treatment of underlying calcification. 4. Overnight observation with anticipated discharge home tomorrow.  Echo 05/12/20  1. Left ventricular ejection fraction, by estimation, is 55 to 60%. The  left ventricle has normal function. The left ventricle has no regional  wall motion abnormalities. Left ventricular diastolic parameters were  normal.   2. Right ventricular systolic function is normal. The right ventricular  size is normal.   3. The mitral valve is normal in structure. No evidence of mitral valve  regurgitation.   4. The aortic valve is grossly normal. Aortic valve regurgitation is not  visualized.   5. The inferior vena cava is normal in size with greater than 50%  respiratory variability, suggesting right atrial pressure of 3 mmHg.   EKG:  EKG is  ordered today.  The ekg ordered  today demonstrates NSR 77 bpm with no acute changes.  Recent Labs: 10/26/2019: ALT 32 05/16/2020: TSH 1.291 06/15/2020: BUN 21; Creatinine, Ser 1.00; Hemoglobin 14.5; Platelets 211; Potassium 4.5; Sodium 137  Recent Lipid Panel    Component Value Date/Time   CHOL 185 10/28/2019 0940   TRIG 224 (H) 10/28/2019 0940   HDL 56 10/28/2019 0940   CHOLHDL 3.3 10/28/2019 0940   VLDL 45 (H) 10/28/2019 0940   LDLCALC 84 10/28/2019 0940   LDLDIRECT 225.0 05/01/2019 0938    Home Medications   Current Meds  Medication Sig  . aspirin 81 MG chewable tablet Chew 1 tablet (81 mg total) by mouth daily.  . Cholecalciferol (DIALYVITE VITAMIN D 5000) 125 MCG (5000 UT) capsule Take 5,000 Units by mouth daily.  . clopidogrel (PLAVIX) 75 MG tablet TAKE 1 TABLET (75 MG TOTAL) BY MOUTH DAILY AT 6 (SIX) AM.  . fexofenadine (ALLEGRA) 180 MG tablet Take 180 mg by mouth daily.   .  fluticasone (FLONASE) 50 MCG/ACT nasal spray SPRAY 2 SPRAYS INTO EACH NOSTRIL EVERY DAY  . losartan (COZAAR) 50 MG tablet Take 1 tablet (50 mg total) by mouth daily.  . nitroGLYCERIN (NITROSTAT) 0.4 MG SL tablet Place 1 tablet (0.4 mg total) under the tongue every 5 (five) minutes as needed for chest pain.  . pantoprazole (PROTONIX) 40 MG tablet Take 1 tablet (40 mg total) by mouth daily.  [name] PRALUENT 75 MG/ML SOAJ Inject 75 mg into the skin every 14 (fourteen) days.  . [DISCONTINUED] metoprolol succinate (TOPROL-XL) 25 MG 24 hr tablet Take 1 tablet (25 mg total) by mouth daily.     Review of Systems  All other systems reviewed and are otherwise negative except as noted above.  Physical Exam    VS:  BP 122/72 (BP Location: Left Arm, Patient Position: Sitting, Cuff Size: Normal)   Pulse 77   Ht 6' (1.829 m)   Wt 205 lb (93 kg)   SpO2 97%   BMI 27.80 kg/m  , BMI Body mass index is 27.8 kg/m.  Wt Readings from Last 3 Encounters:  06/27/20 205 lb (93 kg)  06/24/20 205 lb (93 kg)  06/14/20 200 lb 9.6 oz (91 kg)    GEN: Well nourished, well developed, in no acute distress. HEENT: normal. Neck: Supple, no JVD, carotid bruits, or masses. Cardiac: RRR, no murmurs, rubs, or gallops. No clubbing, cyanosis, edema.  Radials/PT 2+ and equal bilaterally.  Respiratory:  Respirations regular and unlabored, clear to auscultation bilaterally. GI: Soft, nontender, nondistended. MS: No deformity or atrophy. Skin: Warm and dry, no rash.  Right radial catheterization site healing appropriately with no hematoma, ecchymosis nor signs of infection. Neuro:  Strength and sensation are intact. Psych: Normal affect.  Assessment & Plan    1. CAD -s/p DES to RCA with known residual disease to LCx recommended for medical therapy.  Recommended for DAPT for at least 6 months.  Per cath report if he has chest pain refractory to maximal tolerated doses of 2 antianginal agents, PCI to LCx considered.  EKG today NSR  with no acute ST/T wave changes.  He reports no recurrent anginal symptoms.  GDMT includes DAPT (aspirin, Plavix), Toprol, Praluent. Reports some anorgasmia with Metoprolol and as such we will trial reduced dose of 12.5mg .   2. HLD, LDL goal < 70 - Statin intolerance.  He has not had repeat lipid panel since starting Prilosec.  Will order CMP, fasting lipid panel [facility]. If  LDL >70 plan to refer to lipid clinic.   3. PAD / AAA s/p endovascular repair and iliac stent placement - Follows with vascular surgery. Recently seen 06/24/20. Recommended for annual ultrasound of the aorta for monitoring.   4. HTN - BP well controlled. Continue current antihypertensive regimen.   Disposition: Follow up in 2 month(s) with Dr. [name] as scheduled.  Signed, Loel Dubonnet, NP 06/27/2020, 2:31 PM [facility]

## 2020-07-08 ENCOUNTER — Ambulatory Visit: Payer: 59 | Admitting: Cardiology

## 2020-08-18 ENCOUNTER — Other Ambulatory Visit (INDEPENDENT_AMBULATORY_CARE_PROVIDER_SITE_OTHER): Payer: Self-pay | Admitting: Vascular Surgery

## 2020-08-18 ENCOUNTER — Telehealth: Payer: Self-pay

## 2020-08-18 ENCOUNTER — Other Ambulatory Visit
Admission: RE | Admit: 2020-08-18 | Discharge: 2020-08-18 | Disposition: A | Discharge status: Home / Self Care | Payer: 59 | Attending: Family | Admitting: Family

## 2020-08-18 DIAGNOSIS — N138 Other obstructive and reflux uropathy: Secondary | ICD-10-CM | POA: Insufficient documentation

## 2020-08-18 DIAGNOSIS — N401 Enlarged prostate with lower urinary tract symptoms: Secondary | ICD-10-CM | POA: Diagnosis present

## 2020-08-18 DIAGNOSIS — E785 Hyperlipidemia, unspecified: Secondary | ICD-10-CM | POA: Diagnosis present

## 2020-08-18 LAB — COMPREHENSIVE METABOLIC PANEL
ALT: 31 U/L (ref 0–44)
AST: 28 U/L (ref 15–41)
Albumin: 4.1 g/dL (ref 3.5–5.0)
Alkaline Phosphatase: 58 U/L (ref 38–126)
Anion gap: 8 (ref 5–15)
BUN: 18 mg/dL (ref 6–20)
CO2: 25 mmol/L (ref 22–32)
Calcium: 9.2 mg/dL (ref 8.9–10.3)
Chloride: 106 mmol/L (ref 98–111)
Creatinine, Ser: 1.09 mg/dL (ref 0.61–1.24)
GFR, Estimated: >60 mL/min (ref >60)
Glucose, Bld: 117 mg/dL — ABNORMAL HIGH (ref 70–99)
Potassium: 4.7 mmol/L (ref 3.5–5.1)
Sodium: 139 mmol/L (ref 135–145)
Total Bilirubin: 0.6 mg/dL (ref 0.3–1.2)
Total Protein: 7.5 g/dL (ref 6.5–8.1)

## 2020-08-18 LAB — LIPID PANEL
Cholesterol: 206 mg/dL — ABNORMAL HIGH (ref 0–200)
HDL: 61 mg/dL (ref >40)
LDL Cholesterol: 115 mg/dL — ABNORMAL HIGH (ref 0–99)
Total CHOL/HDL Ratio: 3.4 RATIO
Triglycerides: 149 mg/dL (ref <150)
VLDL: 30 mg/dL (ref 0–40)

## 2020-08-18 LAB — PSA: Prostatic Specific Antigen: 0.53 ng/mL (ref 0.00–4.00)

## 2020-08-18 MED ORDER — EZETIMIBE 10 MG PO TABS: 10.0000 mg | ORAL_TABLET | Freq: Every day | ORAL | 3 refills | Status: DC

## 2020-08-18 NOTE — Telephone Encounter (Signed)
-----   Message from Kate Sable, MD sent at 08/18/2020  9:31 AM EDT ----- Elevated LDL.  Continue Praluent, start Zetia 10 mg daily.  Keep follow-up appointment.

## 2020-08-18 NOTE — Telephone Encounter (Signed)
Called patient and informed him of the result note below:  Kate Sable, MD  [place], RN Elevated LDL. Continue Praluent, start Zetia 10 mg daily. Keep follow-up appointment.   Patient verbalized understanding and agreed with plan. Prescription sent into pharmacy.

## 2020-08-19 ENCOUNTER — Other Ambulatory Visit: Payer: Self-pay | Admitting: *Deleted

## 2020-08-19 ENCOUNTER — Encounter: Payer: Self-pay | Admitting: Cardiology

## 2020-08-19 ENCOUNTER — Ambulatory Visit (INDEPENDENT_AMBULATORY_CARE_PROVIDER_SITE_OTHER): Payer: 59 | Admitting: Cardiology

## 2020-08-19 ENCOUNTER — Other Ambulatory Visit: Payer: Self-pay

## 2020-08-19 VITALS — BP 138/82 | HR 78 | Ht 72.0 in | Wt 201.0 lb

## 2020-08-19 DIAGNOSIS — I739 Peripheral vascular disease, unspecified: Secondary | ICD-10-CM

## 2020-08-19 DIAGNOSIS — E785 Hyperlipidemia, unspecified: Secondary | ICD-10-CM

## 2020-08-19 DIAGNOSIS — I10 Essential (primary) hypertension: Secondary | ICD-10-CM | POA: Diagnosis not present

## 2020-08-19 DIAGNOSIS — I2510 Atherosclerotic heart disease of native coronary artery without angina pectoris: Secondary | ICD-10-CM | POA: Diagnosis not present

## 2020-08-19 MED ORDER — PRALUENT 75 MG/ML ~~LOC~~ SOAJ: 75.0000 mg | SUBCUTANEOUS | 2 refills | Status: DC

## 2020-08-19 NOTE — Progress Notes (Signed)
Cardiology Office Note:    Date:  08/19/2020   ID:  Antonio Russell, DOB [birth year], MRN [id number]  PCP:  Abner Greenspan, MD  Cardiologist:  Kate Sable, MD  Electrophysiologist:  None   Referring MD: Abner Greenspan, MD   Chief Complaint  Patient presents with  . Other    2 month follow up post Cath. Med reviewed verbally with patient.     History of Present Illness:    Antonio Russell is a 60 y.o. male with a hx of CAD/PCI to [facility] 05/2020 (LAD 50%, mLCx 60-70%), hyperlipidemia, hypertension, former smoker x40+ years, AAA s/p endoprosthetic repair 08/2019,  peripheral arterial disease s/p R and L iliac stents, sleep apnea who presents for follow-up.   Previously seen for chest pain, work-up with coronary CTA showed significant stenosis.  Underwent left heart cath with significant RCA disease, stent was placed to the mid RCA.  LAD had 50% stenosis, left circumflex 60 to 70% disease.  He denies any further episodes of chest pain.  Tolerating all medications as prescribed including aspirin, Plavix without any bleeding issues.  Did have some leg cramps, started taking a multivitamin with resolution.  Plans to follow-up with vascular surgery if symptoms recur.   Prior notes Echocardiogram 05/2020 showed normal systolic and diastolic function, EF 55 to 60%  Coronary CTA 05/2020 showed calcium score 458, severe stenosis in the proximal left circumflex, moderate stenosis in the proximal RCA, CT FFR showed significant mid left circumflex stenosis.   Left heart cath 06/14/2020 showed 50% mid LAD stenosis, 60 to 70% proximal/mid left circumflex disease, 95% mid RCA lesion.  Underwent PCI to mid RCA using drug-eluting stent.   Past Medical History:  Diagnosis Date  . Allergic rhinitis   . Allergy   . Anxiety   . Arthritis   . Back pain   . CAD (coronary artery disease)    a. 2012 Cath: nonobstructive, nl LV fxn, rec aggressive med management; b. 05/2020 Cath/PCI: LM nl, LAD 27m, RI mod diff  dzs, LCX 65p/m, OM3 20, RCA 30p, 43m (2.5x12 Resolute Onyx DES).  . GERD (gastroesophageal reflux disease)   . Headache(784.0)   . History of kidney stones   . HLD (hyperlipidemia)   . Hyperparathyroidism ([place])   . Hypertension   . Impotence of organic origin   . Other testicular hypofunction   . Peripheral vascular disease ([place])   . Personal history of urinary calculi   . Sleep apnea    no c-pap or bi-pap  . Thyroid disease    Hyperparathyroidism  . Tobacco use disorder   . Unspecified hearing loss     Past Surgical History:  Procedure Laterality Date  . CARDIAC CATHETERIZATION    . COLONOSCOPY    . CORONARY STENT INTERVENTION N/A 06/14/2020   Procedure: CORONARY STENT INTERVENTION;  Surgeon: Nelva Bush, MD;  Location: [place] [facility];  Service: Cardiovascular;  Laterality: N/A;  . ENDOVASCULAR REPAIR/STENT GRAFT N/A 08/12/2019   Procedure: ENDOVASCULAR REPAIR/STENT GRAFT;  Surgeon: Algernon Huxley, MD;  Location: [place] [facility];  Service: Cardiovascular;  Laterality: N/A;  . EXTRACORPOREAL SHOCK WAVE LITHOTRIPSY Right 10/29/2019   Procedure: EXTRACORPOREAL SHOCK WAVE LITHOTRIPSY (ESWL);  Surgeon: Billey Co, MD;  Location: [facility];  Service: Urology;  Laterality: Right;  . HEMORRHOID SURGERY    . HEMORRHOID SURGERY    . KNEE SURGERY Right 2013  . LEFT HEART CATH AND CORONARY ANGIOGRAPHY Left 06/14/2020   Procedure: LEFT HEART  CATH AND CORONARY ANGIOGRAPHY;  Surgeon: Nelva Bush, MD;  Location: [place] [facility];  Service: Cardiovascular;  Laterality: Left;  . NASAL SEPTUM SURGERY    . PARATHYROID EXPLORATION N/A 02/12/2020   Procedure: PARATHYROID EXPLORATION WITH PARATHROIDECTOMY;  Surgeon: Armandina Gemma, MD;  Location: [facility];  Service: General;  Laterality: N/A;    Current Medications: Current Meds  Medication Sig  . aspirin 81 MG chewable tablet Chew 1 tablet (81 mg total) by mouth daily.  . Cholecalciferol (DIALYVITE VITAMIN D 5000) 125 MCG  (5000 UT) capsule Take 5,000 Units by mouth daily.  . clopidogrel (PLAVIX) 75 MG tablet TAKE 1 TABLET (75 MG TOTAL) BY MOUTH DAILY AT 6 (SIX) AM.  . ezetimibe (ZETIA) 10 MG tablet Take 1 tablet (10 mg total) by mouth daily.  . fexofenadine (ALLEGRA) 180 MG tablet Take 180 mg by mouth daily.   . fluticasone (FLONASE) 50 MCG/ACT nasal spray SPRAY 2 SPRAYS INTO EACH NOSTRIL EVERY DAY  . metoprolol succinate (TOPROL-XL) 25 MG 24 hr tablet Take 0.5 tablets (12.5 mg total) by mouth daily.  . nitroGLYCERIN (NITROSTAT) 0.4 MG SL tablet Place 1 tablet (0.4 mg total) under the tongue every 5 (five) minutes as needed for chest pain.  . pantoprazole (PROTONIX) 40 MG tablet Take 1 tablet (40 mg total) by mouth daily.  [name] PRALUENT 75 MG/ML SOAJ Inject 75 mg into the skin every 14 (fourteen) days.     Allergies:   Sertraline hcl, Amlodipine, Crestor [rosuvastatin calcium], Lipitor [atorvastatin calcium], and Zocor [simvastatin - high dose]   Social History   Socioeconomic History  . Marital status: Married    Spouse name: Not on file  . Number of children: Not on file  . Years of education: Not on file  . Highest education level: Not on file  Occupational History  . Not on file  Tobacco Use  . Smoking status: Former Smoker    Packs/day: 0.25    Years: 35.00    Pack years: 8.75    Types: Cigarettes    Quit date: 10/08/2019    Years since quitting: 0.8  . Smokeless tobacco: Former Systems developer    Types: Chew, Snuff  . Tobacco comment: couple of months - 40 yrs ago  Vaping Use  . Vaping Use: Former  Substance and Sexual Activity  . Alcohol use: Yes    Alcohol/week: 4.0 - 5.0 standard drinks    Types: 4 - 5 Cans of beer per week    Comment: daily   . Drug use: No  . Sexual activity: Yes    Birth control/protection: None  Other Topics Concern  . Not on file  Social History Narrative   Coaches basketball   Social Determinants of Health   Financial Resource Strain: Not on file  Food Insecurity:  Not on file  Transportation Needs: Not on file  Physical Activity: Not on file  Stress: Not on file  Social Connections: Not on file     Family History: The patient's family history includes Cancer in his father; Colon cancer in an other family member; Diabetes in his mother; Esophageal cancer in his paternal uncle; Heart disease in an other family member; Hyperlipidemia in his father and mother; Hypertension in his father, mother, and sister; Lung cancer in an other family member. There is no history of Stomach cancer or Rectal cancer.  ROS:   Please see the history of present illness.     All other systems reviewed and are negative.  EKGs/Labs/Other  Studies Reviewed:    The following studies were reviewed today:   EKG:  EKG is  ordered today.  The ekg ordered today demonstrates sinus rhythm, normal ECG.  Recent Labs: 05/16/2020: TSH 1.291 06/15/2020: Hemoglobin 14.5; Platelets 211 08/18/2020: ALT 31; BUN 18; Creatinine, Ser 1.09; Potassium 4.7; Sodium 139  Recent Lipid Panel    Component Value Date/Time   CHOL 206 (H) 08/18/2020 0801   TRIG 149 08/18/2020 0801   HDL 61 08/18/2020 0801   CHOLHDL 3.4 08/18/2020 0801   VLDL 30 08/18/2020 0801   LDLCALC 115 (H) 08/18/2020 0801   LDLDIRECT 225.0 05/01/2019 0938    Physical Exam:    VS:  BP 138/82 (BP Location: Left Arm, Patient Position: Sitting, Cuff Size: Normal)   Pulse 78   Ht 6' (1.829 m)   Wt 201 lb (91.2 kg)   SpO2 98%   BMI 27.26 kg/m     Wt Readings from Last 3 Encounters:  08/19/20 201 lb (91.2 kg)  06/27/20 205 lb (93 kg)  06/24/20 205 lb (93 kg)     GEN:  Well nourished, well developed in no acute distress HEENT: Normal NECK: No JVD; No carotid bruits LYMPHATICS: No lymphadenopathy CARDIAC: RRR, no murmurs, rubs, gallops RESPIRATORY:  Clear to auscultation without rales, wheezing or rhonchi  ABDOMEN: Soft, non-tender, non-distended MUSCULOSKELETAL:  No edema; No deformity  SKIN: Warm and  dry NEUROLOGIC:  Alert and oriented x 3 PSYCHIATRIC:  Normal affect   ASSESSMENT:    1. Coronary artery disease involving native coronary artery of native heart without angina pectoris   2. Hyperlipidemia LDL goal <70   3. PAD (peripheral artery disease) ([place])   4. Essential hypertension    PLAN:     1. CAD/PCI to mid RCA 05/2020.  Continue aspirin, Plavix, Praluent, Toprol-XL.  Intolerant to statins, LDL not at goal, start Zetia 10 mg daily. 2.  hyperlipidemia.  Intolerant to statins, continue Praluent, anxiety.   3. PAD, AAA status post endovascular repair and iliac stents placement.  Aspirin, Praluent, Plavix.  Follows up with vascular surgery. 4. History of hypertension.  BP controlled.  Continue losartan, Toprol-XL.  Follow-up in 6 months  Total encounter time 35 minutes  Greater than 50% was spent in counseling and coordination of care with the patient    Medication Adjustments/Labs and Tests Ordered: Current medicines are reviewed at length with the patient today.  Concerns regarding medicines are outlined above.  Orders Placed This Encounter  Procedures  . EKG 12-Lead   No orders of the defined types were placed in this encounter.   Patient Instructions  Medication Instructions:  Your physician recommends that you continue on your current medications as directed. Please refer to the Current Medication list given to you today.  *If you need a refill on your cardiac medications before your next appointment, please call your pharmacy*   Lab Work: None ordered If you have labs (blood work) drawn today and your tests are completely normal, you will receive your results only by: [name] MyChart Message (if you have MyChart) OR . A paper copy in the mail If you have any lab test that is abnormal or we need to change your treatment, we will call you to review the results.   Testing/Procedures: None ordered   Follow-Up: At [facility], you and your health needs are  our priority.  As part of our continuing mission to provide you with exceptional heart care, we have created designated Provider Care Teams.  These Care Teams include your primary Cardiologist (physician) and Advanced Practice Providers (APPs -  Physician Assistants and Nurse Practitioners) who all work together to provide you with the care you need, when you need it.  We recommend signing up for the patient portal called "MyChart".  Sign up information is provided on this After Visit Summary.  MyChart is used to connect with patients for Virtual Visits (Telemedicine).  Patients are able to view lab/test results, encounter notes, upcoming appointments, etc.  Non-urgent messages can be sent to your provider as well.   To learn more about what you can do with MyChart, go to [email or web address].    Your next appointment:   6 month(s)  The format for your next appointment:   In Person  Provider:   Kate Sable, MD   Other Instructions      Signed, Kate Sable, MD  08/19/2020 12:29 PM    [place]

## 2020-08-19 NOTE — Patient Instructions (Signed)

## 2020-09-01 ENCOUNTER — Telehealth: Payer: Self-pay | Admitting: Cardiology

## 2020-09-01 NOTE — Telephone Encounter (Addendum)
   Patient Name: Antonio Russell  DOB: 02/27/1961  MRN: [id number]   Primary Cardiologist: Kate Sable, MD  Chart reviewed as part of pre-operative protocol coverage.   Simple dental extractions  Or procedure are considered low risk procedures per guidelines and generally do not require any specific cardiac clearance. It is also generally accepted that for simple extractions and dental cleanings, there is no need to interrupt blood thinner therapy.  Of note, he had coronary stent 05/2020 and is maintained on Aspirin, Plavix. This therapy should not be interrupted at this time.  SBE prophylaxis is not required for the patient from a cardiac standpoint.  No fax number provided. Unable to locate online. Called dental office and reached voicemail despite calling during their operating hours. They are open Mon-Thurs until 4:30PM. I left a detailed VM and callback number. Will remove from preop pool.   Please call with questions.  Loel Dubonnet, NP 09/01/2020, 3:42 PM

## 2020-09-01 NOTE — Telephone Encounter (Signed)
Please call and advies if patient needs pre medication for a crown.

## 2020-10-12 ENCOUNTER — Other Ambulatory Visit: Payer: Self-pay | Admitting: Nurse Practitioner

## 2020-10-12 NOTE — Telephone Encounter (Signed)
This is a [place] pt 

## 2020-10-24 ENCOUNTER — Other Ambulatory Visit: Payer: Self-pay | Admitting: *Deleted

## 2020-10-24 MED ORDER — LOSARTAN POTASSIUM 50 MG PO TABS: 50.0000 mg | ORAL_TABLET | Freq: Every day | ORAL | 5 refills | Status: DC

## 2020-11-14 ENCOUNTER — Other Ambulatory Visit: Payer: Self-pay

## 2020-11-14 MED ORDER — PRALUENT 75 MG/ML ~~LOC~~ SOAJ: 75.0000 mg | SUBCUTANEOUS | 2 refills | Status: DC

## 2020-11-15 ENCOUNTER — Emergency Department
Admission: EM | Admit: 2020-11-15 | Discharge: 2020-11-15 | Disposition: A | Discharge status: Home / Self Care | Payer: 59 | Attending: Emergency Medicine | Admitting: Emergency Medicine

## 2020-11-15 ENCOUNTER — Other Ambulatory Visit: Payer: Self-pay

## 2020-11-15 ENCOUNTER — Encounter: Payer: Self-pay | Admitting: Emergency Medicine

## 2020-11-15 DIAGNOSIS — S51812A Laceration without foreign body of left forearm, initial encounter: Secondary | ICD-10-CM | POA: Diagnosis not present

## 2020-11-15 DIAGNOSIS — Z7982 Long term (current) use of aspirin: Secondary | ICD-10-CM | POA: Insufficient documentation

## 2020-11-15 DIAGNOSIS — Z79899 Other long term (current) drug therapy: Secondary | ICD-10-CM | POA: Insufficient documentation

## 2020-11-15 DIAGNOSIS — W268XXA Contact with other sharp object(s), not elsewhere classified, initial encounter: Secondary | ICD-10-CM | POA: Insufficient documentation

## 2020-11-15 DIAGNOSIS — Z7901 Long term (current) use of anticoagulants: Secondary | ICD-10-CM | POA: Diagnosis not present

## 2020-11-15 DIAGNOSIS — Z955 Presence of coronary angioplasty implant and graft: Secondary | ICD-10-CM | POA: Insufficient documentation

## 2020-11-15 DIAGNOSIS — I10 Essential (primary) hypertension: Secondary | ICD-10-CM | POA: Insufficient documentation

## 2020-11-15 DIAGNOSIS — Z87891 Personal history of nicotine dependence: Secondary | ICD-10-CM | POA: Diagnosis not present

## 2020-11-15 DIAGNOSIS — S6992XA Unspecified injury of left wrist, hand and finger(s), initial encounter: Secondary | ICD-10-CM | POA: Diagnosis present

## 2020-11-15 DIAGNOSIS — I2510 Atherosclerotic heart disease of native coronary artery without angina pectoris: Secondary | ICD-10-CM | POA: Insufficient documentation

## 2020-11-15 NOTE — ED Notes (Signed)
See triage note  Presents with skin tear laceration to posterior left forearm  States he hit it on a piece of wood  This happened yesterday around 2pm

## 2020-11-15 NOTE — ED Triage Notes (Signed)
Pt reports cut his left arm with a piece of wood yesterday. Pt states wrapped it and put neosporin on it but it is still bleeding some. Pt reports is on eloquis. Pt with skin tear to left arm just under elbow.

## 2020-11-15 NOTE — Discharge Instructions (Addendum)
Keep the surgicel on the wound until it comes off by itself Keep the area as dry as possible until the bleeding has stopped Apply pressure if bleeding restarts Return to the ER if any sign of infection or if the bleeding continues later today

## 2020-11-15 NOTE — ED Provider Notes (Signed)
[facility] Emergency Department Provider Note  ____________________________________________   Event Date/Time   First MD Initiated Contact with Patient 11/15/20 0930     (approximate)  I have reviewed the triage vital signs and the nursing notes.   HISTORY  Chief Complaint Laceration and Arm Injury    HPI Antonio Russell is a 60 y.o. male presents emergency department with a laceration occurred yesterday.  Patient is on a blood thinner and states that he is continue to bleed.  He recently had a stent placed and this is why he is on a blood thinner.  Patient states that he had it wrapped last night and then awoke this morning it was still oozing.  He does not want stitches.  Tdap is up-to-date  Past Medical History:  Diagnosis Date   Allergic rhinitis    Allergy    Anxiety    Arthritis    Back pain    CAD (coronary artery disease)    a. 2012 Cath: nonobstructive, nl LV fxn, rec aggressive med management; b. 05/2020 Cath/PCI: LM nl, LAD 43m RI mod diff dzs, LCX 65p/m, OM3 20, RCA 30p, 916m2.5x12 Resolute Onyx DES).   GERD (gastroesophageal reflux disease)    Headache(784.0)    History of kidney stones    HLD (hyperlipidemia)    Hyperparathyroidism (HCC)    Hypertension    Impotence of organic origin    Other testicular hypofunction    Peripheral vascular disease (HCC)    Personal history of urinary calculi    Sleep apnea    no c-pap or bi-pap   Thyroid disease    Hyperparathyroidism   Tobacco use disorder    Unspecified hearing loss     Patient Active Problem List   Diagnosis Date Noted   Stable angina ([facility]/15/2022   Abnormal cardiac CT angiography 06/14/2020   Multiple thyroid nodules 01/05/2020   Carotid stenosis 12/25/2019   AAA (abdominal aortic aneurysm) without rupture ([facility]04/13/2021   Atherosclerosis of native arteries of extremity with intermittent claudication ([facility]/13/2021   Multiple joint pain 05/01/2019   Primary  hyperparathyroidism ([facility]/14/2020   Vitamin D deficiency 01/14/2019   Left knee pain 12/01/2018   Left foot pain 12/01/2018   Chronic daily headache 12/06/2017   Hypercalcemia 11/08/2017   Headache 11/04/2017   External hemorrhoid 11/05/2016   Alcohol use 11/04/2016   Prediabetes 11/02/2016   Skin lesion of left leg 11/02/2016   Routine general medical examination at a health care facility 10/25/2016   Prostate cancer screening 10/25/2016   Leukocytosis 11/30/2014   Special screening for malignant neoplasms, colon 02/16/2011   Arthritis    Essential hypertension 07/13/2010   CAD (coronary artery disease)    Testosterone deficiency 05/25/2009   Hyperlipidemia 05/25/2009   ERECTILE DYSFUNCTION, 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   RENAL CALCULUS, HX OF 05/11/2009   TOBACCO USE 09/12/2007   LOSS, HEARING NOS 10/10/2006    Past Surgical History:  Procedure Laterality Date   CARDIAC CATHETERIZATION     COLONOSCOPY     CORONARY STENT INTERVENTION N/A 06/14/2020   Procedure: CORONARY STENT INTERVENTION;  Surgeon: EnNelva BushMD;  [facility];  Service: Cardiovascular;  Laterality: N/A;   ENDOVASCULAR REPAIR/STENT GRAFT N/A 08/12/2019   Procedure: ENDOVASCULAR REPAIR/STENT GRAFT;  Surgeon: DeAlgernon HuxleyMD;  [facility];  Service: Cardiovascular;  Laterality: N/A;   EXTRACORPOREAL SHOCK WAVE LITHOTRIPSY Right 10/29/2019   Procedure: EXTRACORPOREAL SHOCK WAVE LITHOTRIPSY (ESWL);  Surgeon: Billey Co, MD;  Location: [facility];  Service: Urology;  Laterality: Right;   HEMORRHOID SURGERY     HEMORRHOID SURGERY     KNEE SURGERY Right 2013   LEFT HEART CATH AND CORONARY ANGIOGRAPHY Left 06/14/2020   Procedure: LEFT HEART CATH AND CORONARY ANGIOGRAPHY;  Surgeon: Nelva Bush, MD;  Location: [place] [facility];  Service: Cardiovascular;  Laterality: Left;   NASAL SEPTUM SURGERY     PARATHYROID EXPLORATION N/A 02/12/2020   Procedure: PARATHYROID  EXPLORATION WITH PARATHROIDECTOMY;  Surgeon: Armandina Gemma, MD;  Location: [facility];  Service: General;  Laterality: N/A;    Prior to Admission medications   Medication Sig Start Date End Date Taking? Authorizing Provider  aspirin 81 MG chewable tablet Chew 1 tablet (81 mg total) by mouth daily. 06/15/20   Theora Gianotti, NP  Cholecalciferol (DIALYVITE VITAMIN D 5000) 125 MCG (5000 UT) capsule Take 5,000 Units by mouth daily.    [provider]  clopidogrel (PLAVIX) 75 MG tablet TAKE 1 TABLET (75 MG TOTAL) BY MOUTH DAILY AT 6 (SIX) AM. 08/18/20   Algernon Huxley, MD  ezetimibe (ZETIA) 10 MG tablet Take 1 tablet (10 mg total) by mouth daily. 08/18/20 11/16/20  Kate Sable, MD  fexofenadine (ALLEGRA) 180 MG tablet Take 180 mg by mouth daily.     [provider]  fluticasone (FLONASE) 50 MCG/ACT nasal spray SPRAY 2 SPRAYS INTO EACH NOSTRIL EVERY DAY 12/31/17   Carollee Herter, Alferd Apa, DO  losartan (COZAAR) 50 MG tablet Take 1 tablet (50 mg total) by mouth daily. 10/24/20 01/22/21  Kate Sable, MD  metoprolol succinate (TOPROL-XL) 25 MG 24 hr tablet Take 0.5 tablets (12.5 mg total) by mouth daily. 06/27/20   Loel Dubonnet, NP  nitroGLYCERIN (NITROSTAT) 0.4 MG SL tablet PLACE 1 TABLET UNDER THE TONGUE EVERY 5 MINUTES AS NEEDED FOR CHEST PAIN. 10/12/20   Theora Gianotti, NP  pantoprazole (PROTONIX) 40 MG tablet Take 1 tablet (40 mg total) by mouth daily. 02/01/20   Ladene Artist, MD  PRALUENT 75 MG/ML SOAJ Inject 75 mg into the skin every 14 (fourteen) days. 11/14/20   Kate Sable, MD    Allergies Sertraline hcl, Amlodipine, Crestor [rosuvastatin calcium], Lipitor [atorvastatin calcium], and Zocor [simvastatin - high dose]  Family History  Problem Relation Age of Onset   Hypertension Mother    Hyperlipidemia Mother    Diabetes Mother    Hypertension Father    Hyperlipidemia Father    Cancer Father        throat   Hypertension Sister    Lung  cancer Other        GF   Colon cancer Other        dx "close to 37"   Heart disease Other        GM   Esophageal cancer Paternal Uncle    Stomach cancer Neg Hx    Rectal cancer Neg Hx     Social History Social History   Tobacco Use   Smoking status: Former    Packs/day: 0.25    Years: 35.00    Pack years: 8.75    Types: Cigarettes    Quit date: 10/08/2019    Years since quitting: 1.1   Smokeless tobacco: Former    Types: Chew, Snuff   Tobacco comments:    couple of months - 66 yrs ago  Vaping Use   Vaping Use: Former  Substance Use Topics   Alcohol use: Yes  Alcohol/week: 4.0 - 5.0 standard drinks    Types: 4 - 5 Cans of beer per week    Comment: daily    Drug use: No    Review of Systems  Constitutional: No fever/chills Eyes: No visual changes. ENT: No sore throat. Respiratory: Denies cough Cardiovascular: Denies chest pain Gastrointestinal: Denies abdominal pain Genitourinary: Negative for dysuria. Musculoskeletal: Negative for back pain. Skin: Negative for rash. Psychiatric: no mood changes,     ____________________________________________   PHYSICAL EXAM:  VITAL SIGNS: ED Triage Vitals  Enc Vitals Group     BP 11/15/20 0853 (!) 160/98     Pulse Rate 11/15/20 0853 (!) 120     Resp 11/15/20 0853 18     Temp 11/15/20 0853 98 F (36.7 C)     Temp Source 11/15/20 0853 Oral     SpO2 11/15/20 0853 99 %     Weight 11/15/20 0854 200 lb (90.7 kg)     Height 11/15/20 0854 6' (1.829 m)     Head Circumference --      Peak Flow --      Pain Score 11/15/20 0853 0     Pain Loc --      Pain Edu? --      Excl. in [place]? --     Constitutional: Alert and oriented. Well appearing and in no acute distress. Eyes: Conjunctivae are normal.  Head: Atraumatic. Nose: No congestion/rhinnorhea. Mouth/Throat: Mucous membranes are moist.   Neck:  supple no lymphadenopathy noted Cardiovascular: Normal rate, regular rhythm.  Respiratory: Normal respiratory effort.   No retractions GU: deferred Musculoskeletal: FROM all extremities, warm and well perfused, left forearm has a already healing laceration which is oozing at 1 end of the laceration.  No foreign body noted Neurologic:  Normal speech and language.  Skin:  Skin is warm, dry. No rash noted. Psychiatric: Mood and affect are normal. Speech and behavior are normal.  ____________________________________________   LABS (all labs ordered are listed, but only abnormal results are displayed)  Labs Reviewed - No data to display ____________________________________________   ____________________________________________  RADIOLOGY    ____________________________________________   PROCEDURES  Procedure(s) performed: Surgicel was placed on the area that continues to bleed, dressing was applied, pressure wrap was applied.  Neurovascularly intact  Procedures    ____________________________________________   INITIAL IMPRESSION / ASSESSMENT AND PLAN / ED COURSE  Pertinent labs & imaging results that were available during my care of the patient were reviewed by me and considered in my medical decision making (see chart for details).   Patient is a 60 year old male presents with laceration left forearm.  See HPI.  Physical exam shows patient per stable Laceration has already begun to heal.  There is small area of bleeding.  See procedure note for Surgicel placement.  Patient is follow-up with his regular doctor if not improving in 2 to 3 days.  Return emergency department if worsening.  He is discharged stable condition.     Antonio Russell was evaluated in Emergency Department on 11/15/2020 for the symptoms described in the history of present illness. He was evaluated in the context of the global COVID-19 pandemic, which necessitated consideration that the patient might be at risk for infection with the SARS-CoV-2 virus that causes COVID-19. Institutional protocols and algorithms that pertain to  the evaluation of patients at risk for COVID-19 are in a state of rapid change based on information released by regulatory bodies including the CDC and federal and state organizations. These  policies and algorithms were followed during the patient's care in the ED.    As part of my medical decision making, I reviewed the following data within the [place] notes reviewed and incorporated, Old chart reviewed, Notes from prior ED visits, and [place] Controlled Substance Database  ____________________________________________   FINAL CLINICAL IMPRESSION(S) / ED DIAGNOSES  Final diagnoses:  Laceration of left forearm, initial encounter      NEW MEDICATIONS STARTED DURING THIS VISIT:  Discharge Medication List as of 11/15/2020 10:15 AM       Note:  This document was prepared using Dragon voice recognition software and may include unintentional dictation errors.    Versie Starks, PA-C 11/15/20 1118    Harvest Dark, MD 11/15/20 1320

## 2021-02-09 ENCOUNTER — Other Ambulatory Visit: Payer: Self-pay | Admitting: *Deleted

## 2021-02-09 MED ORDER — PRALUENT 75 MG/ML ~~LOC~~ SOAJ: 75.0000 mg | SUBCUTANEOUS | 0 refills | Status: DC

## 2021-03-28 ENCOUNTER — Other Ambulatory Visit: Payer: Self-pay

## 2021-03-28 MED ORDER — PRALUENT 75 MG/ML ~~LOC~~ SOAJ: 75.0000 mg | SUBCUTANEOUS | 0 refills | Status: DC

## 2021-04-11 ENCOUNTER — Telehealth: Payer: Self-pay

## 2021-04-11 NOTE — Telephone Encounter (Signed)
PA started through [employer] (Key: [id number]) Need help? Call us at [phone]  Sent to Plan today Next Steps The plan will fax you a determination, typically within 1 to 5 business days.

## 2021-04-17 NOTE — Telephone Encounter (Signed)
Per fax received from [facility]: Request can not be processed until we receive completed criteria form or appropriate clinical information. Please fax to [phone].  Our office does not have any record of receiving form to be completed. Addended PA in [email or web address] with provider's DEA number which states was required.   RESPONSE: The plan will fax you a determination, typically within 1 to 5 business days.  Call attempted to [employer] at [phone] in order to inquire about what other information they made need for PA however their office is closed for the holiday today.

## 2021-04-18 NOTE — Telephone Encounter (Signed)
Spoke with Anderson Malta from [facility] [phone]) who states this patient's account is no longer active-coverage expired on 04-01-21. She could not explain why we received a fax stating they "cannot process a request for coverage until receiving a completed criteria form or appropriate clinical information."  Call attempted to reach patient to verify prescription coverage. Unable to leave message-voice mailbox is full.

## 2021-04-20 ENCOUNTER — Telehealth (INDEPENDENT_AMBULATORY_CARE_PROVIDER_SITE_OTHER): Payer: Self-pay | Admitting: Nurse Practitioner

## 2021-04-20 ENCOUNTER — Other Ambulatory Visit: Payer: Self-pay | Admitting: *Deleted

## 2021-04-20 VITALS — Temp 97.5°F

## 2021-04-20 DIAGNOSIS — J029 Acute pharyngitis, unspecified: Secondary | ICD-10-CM | POA: Insufficient documentation

## 2021-04-20 DIAGNOSIS — H938X3 Other specified disorders of ear, bilateral: Secondary | ICD-10-CM | POA: Insufficient documentation

## 2021-04-20 DIAGNOSIS — J3489 Other specified disorders of nose and nasal sinuses: Secondary | ICD-10-CM | POA: Insufficient documentation

## 2021-04-20 NOTE — Assessment & Plan Note (Signed)
He taking his fexofenadine.  Patient has not been using his Flonase as of late did encourage him to start using the Flonase did go over precautions in regards to epistaxis and using nasal saline if needed.

## 2021-04-20 NOTE — Assessment & Plan Note (Signed)
Discussed about COVID testing in the a.m.  Patient has 1 at home.  We discussed possibility of flu and strep testing will defer that until tomorrow after patient has re COVID tested.  Did tell continue over-the-counter analgesics as needed along with warm salt water gargles to aid in his sore throat.  Did discuss signs and symptoms when to seek urgent or emergent health care.  Patient acknowledged

## 2021-04-20 NOTE — Progress Notes (Signed)
Patient ID: Antonio Russell, male    DOB: [birth year], 61 y.o.   MRN: [id number]  Virtual visit completed through [employer], a video enabled telemedicine application. Due to national recommendations of social distancing due to COVID-19, a virtual visit is felt to be most appropriate for this patient at this time. Reviewed limitations, risks, security and privacy concerns of performing a virtual visit and the availability of in person appointments. I also reviewed that there may be a patient responsible charge related to this service. The patient agreed to proceed.   Patient location: home Provider location: [place] at [facility], office Persons participating in this virtual visit: patient, provider   If any vitals were documented, they were collected by patient at home unless specified below.    Temp (!) 97.5 F (36.4 C)    CC: URI symptoms Subjective:   HPI: Antonio Russell is a 61 y.o. male presenting on 04/20/2021 for Fatigue (Started on 04/18/21-sinus congestion, post nasal drip, earache, sore throat. Not much of a cough. No fever. Covid test negative at home this morning 04/20/21)  Symptoms started on 04/18/2021 Covid test today was negative  Moderna vaccine x 2  Son has had  a sinus problem for approx a week. Son is 59 years old [place] HBP with no help Has not had the flu vaccine this year    Relevant past medical, surgical, family and social history reviewed and updated as indicated. Interim medical history since our last visit reviewed. Allergies and medications reviewed and updated. Outpatient Medications Prior to Visit  Medication Sig Dispense Refill   clopidogrel (PLAVIX) 75 MG tablet TAKE 1 TABLET (75 MG TOTAL) BY MOUTH DAILY AT 6 (SIX) AM. 90 tablet 1   fexofenadine (ALLEGRA) 180 MG tablet Take 180 mg by mouth daily.      fluticasone (FLONASE) 50 MCG/ACT nasal spray SPRAY 2 SPRAYS INTO EACH NOSTRIL EVERY DAY 16 g 5   nitroGLYCERIN (NITROSTAT) 0.4 MG SL tablet PLACE 1  TABLET UNDER THE TONGUE EVERY 5 MINUTES AS NEEDED FOR CHEST PAIN. 25 tablet 0   PRALUENT 75 MG/ML SOAJ Inject 75 mg into the skin every 14 (fourteen) days. PLEASE SCHEDULE OFFICE VISIT FOR FURTHER REFILLS. THANK YOU! 2ND ATTEMPT 2 mL 0   aspirin 81 MG chewable tablet Chew 1 tablet (81 mg total) by mouth daily. (Patient not taking: Reported on 04/20/2021)     Cholecalciferol (DIALYVITE VITAMIN D 5000) 125 MCG (5000 UT) capsule Take 5,000 Units by mouth daily. (Patient not taking: Reported on 04/20/2021)     losartan (COZAAR) 50 MG tablet Take 1 tablet (50 mg total) by mouth daily. 30 tablet 5   metoprolol succinate (TOPROL-XL) 25 MG 24 hr tablet Take 0.5 tablets (12.5 mg total) by mouth daily. (Patient not taking: Reported on 04/20/2021) 30 tablet 6   ezetimibe (ZETIA) 10 MG tablet Take 1 tablet (10 mg total) by mouth daily. 90 tablet 3   pantoprazole (PROTONIX) 40 MG tablet Take 1 tablet (40 mg total) by mouth daily. 90 tablet 3   No facility-administered medications prior to visit.     Per HPI unless specifically indicated in ROS section below Review of Systems  Constitutional:  Positive for chills and fatigue. Negative for fever.  HENT:  Positive for congestion, ear pain (full and pressure), postnasal drip, sinus pressure, sinus pain and sore throat.   Respiratory:  Positive for shortness of breath (with sleep). Negative for cough.   Cardiovascular:  Negative for chest pain.  Gastrointestinal:  Negative for abdominal pain, diarrhea, nausea and vomiting.  Musculoskeletal:  Negative for arthralgias and myalgias.  Neurological:  Negative for headaches.  Objective:  Temp (!) 97.5 F (36.4 C)   Wt Readings from Last 3 Encounters:  11/15/20 200 lb (90.7 kg)  08/19/20 201 lb (91.2 kg)  06/27/20 205 lb (93 kg)       Physical exam: Gen: alert, NAD, not ill appearing Pulm: speaks in complete sentences without increased work of breathing Psych: normal mood, normal thought content       Results for orders placed or performed during the hospital encounter of 08/18/20  Lipid panel  Result Value Ref Range   Cholesterol 206 (H) 0 - 200 mg/dL   Triglycerides 149 <150 mg/dL   HDL 61 >40 mg/dL   Total CHOL/HDL Ratio 3.4 RATIO   VLDL 30 0 - 40 mg/dL   LDL Cholesterol 115 (H) 0 - 99 mg/dL  Comprehensive metabolic panel  Result Value Ref Range   Sodium 139 135 - 145 mmol/L   Potassium 4.7 3.5 - 5.1 mmol/L   Chloride 106 98 - 111 mmol/L   CO2 25 22 - 32 mmol/L   Glucose, Bld 117 (H) 70 - 99 mg/dL   BUN 18 6 - 20 mg/dL   Creatinine, Ser 1.09 0.61 - 1.24 mg/dL   Calcium 9.2 8.9 - 10.3 mg/dL   Total Protein 7.5 6.5 - 8.1 g/dL   Albumin 4.1 3.5 - 5.0 g/dL   AST 28 15 - 41 U/L   ALT 31 0 - 44 U/L   Alkaline Phosphatase 58 38 - 126 U/L   Total Bilirubin 0.6 0.3 - 1.2 mg/dL   GFR, Estimated >60 >60 mL/min   Anion gap 8 5 - 15  PSA  Result Value Ref Range   Prostatic Specific Antigen 0.53 0.00 - 4.00 ng/mL   Assessment & Plan:   Problem List Items Addressed This Visit       Other   Sore throat - Primary    Discussed about COVID testing in the a.m.  Patient has 1 at home.  We discussed possibility of flu and strep testing will defer that until tomorrow after patient has re COVID tested.  Did tell continue over-the-counter analgesics as needed along with warm salt water gargles to aid in his sore throat.  Did discuss signs and symptoms when to seek urgent or emergent health care.  Patient acknowledged      Pressure sensation in both ears    Likely due to to eustachian tube dysfunction.  Encourage patient to continue fexofenadine antihistamine and to start using his Flonase nasal spray.      Sinus pressure    He taking his fexofenadine.  Patient has not been using his Flonase as of late did encourage him to start using the Flonase did go over precautions in regards to epistaxis and using nasal saline if needed.        No orders of the defined types were placed in this  encounter.  No orders of the defined types were placed in this encounter.   I discussed the assessment and treatment plan with the patient. The patient was provided an opportunity to ask questions and all were answered. The patient agreed with the plan and demonstrated an understanding of the instructions. The patient was advised to call back or seek an in-person evaluation if the symptoms worsen or if the condition fails to improve as anticipated.  Follow up plan: No  follow-ups on file.  Romilda Garret, NP

## 2021-04-20 NOTE — Telephone Encounter (Signed)
Please advise if ok to refill. 

## 2021-04-20 NOTE — Assessment & Plan Note (Signed)
Likely due to to eustachian tube dysfunction.  Encourage patient to continue fexofenadine antihistamine and to start using his Flonase nasal spray.

## 2021-04-20 NOTE — Telephone Encounter (Signed)
Patient unable to schedule now due to insurance changes  Will check with PCP for refills

## 2021-04-21 ENCOUNTER — Encounter: Payer: Self-pay | Admitting: Nurse Practitioner

## 2021-04-21 NOTE — Telephone Encounter (Signed)
He can have a work note. I asked him what dates he needs

## 2021-04-24 MED ORDER — PRALUENT 75 MG/ML ~~LOC~~ SOAJ: 75.0000 mg | SUBCUTANEOUS | 1 refills | Status: DC

## 2021-04-24 NOTE — Telephone Encounter (Signed)
Incoming fax from [facility] ---  Praluent is no longer covered by insurance and they would like an alternative sent.

## 2021-04-24 NOTE — Telephone Encounter (Signed)
Okay to refill for 2 mos with note to pharmacy, no further refills until a follow up appointment is made.

## 2021-04-28 NOTE — Telephone Encounter (Signed)
Attempted to call patient and received the message that the mailbox is full and unable to leave a VM.  Need to inquire if patient has new insurance, and how we should proceed with his prescription for Praluent or Repatha.

## 2021-05-01 NOTE — Telephone Encounter (Signed)
Attempted to schedule.  

## 2021-05-03 ENCOUNTER — Other Ambulatory Visit: Payer: Self-pay

## 2021-05-03 MED ORDER — LOSARTAN POTASSIUM 50 MG PO TABS: 50.0000 mg | ORAL_TABLET | Freq: Every day | ORAL | 3 refills | Status: DC

## 2021-05-04 NOTE — Telephone Encounter (Signed)
Spoke with patient and he stated that he has new insurance coverage that the pharmacy is unaware of. He stated that he will go into his pharmacy today to give him the insurance information. I informed him that I would resend the prescription through tomorrow afternoon. I also advised to give [place] a call back if he has any problems picking that up.  Patient verbalized understanding and agreed with plan.

## 2021-05-08 MED ORDER — REPATHA SURECLICK 140 MG/ML ~~LOC~~ SOAJ: 1.0000 "pen " | SUBCUTANEOUS | 11 refills | Status: DC

## 2021-05-08 NOTE — Addendum Note (Signed)
Addended by: Kavin Leech on: 05/08/2021 04:52 PM   Modules accepted: Orders

## 2021-05-08 NOTE — Telephone Encounter (Signed)
Called patients pharmacy and they informed me that the patients insurance will not cover the [place]. Sent in a new prescription for Repatha, which will need a prior authorization to see if insurance will cover this.  Will route to refill pool to start the prior auth for this.

## 2021-05-09 NOTE — Telephone Encounter (Signed)
PA has been submitted via covermymeds for Reptha 140 mg/ml inj. PA has been approved. Effective from 05/09/2021 through 05/08/2022.

## 2021-05-09 NOTE — Telephone Encounter (Signed)
Contact pt to make him aware to contact his pharmacy for refill. Pt has been approved for Repatha 140 mg/ml inj. Refill. Voicemail full/no answer

## 2021-05-19 ENCOUNTER — Encounter: Payer: Self-pay | Admitting: Internal Medicine

## 2021-05-19 ENCOUNTER — Other Ambulatory Visit: Payer: Self-pay

## 2021-05-19 ENCOUNTER — Ambulatory Visit (INDEPENDENT_AMBULATORY_CARE_PROVIDER_SITE_OTHER): Payer: BC Managed Care – PPO | Admitting: Internal Medicine

## 2021-05-19 VITALS — BP 150/100 | HR 90 | Ht 72.0 in | Wt 193.4 lb

## 2021-05-19 DIAGNOSIS — E042 Nontoxic multinodular goiter: Secondary | ICD-10-CM | POA: Diagnosis not present

## 2021-05-19 DIAGNOSIS — E559 Vitamin D deficiency, unspecified: Secondary | ICD-10-CM | POA: Diagnosis not present

## 2021-05-19 DIAGNOSIS — E210 Primary hyperparathyroidism: Secondary | ICD-10-CM

## 2021-05-19 LAB — VITAMIN D 25 HYDROXY (VIT D DEFICIENCY, FRACTURES): VITD: 33.22 ng/mL (ref 30.00–100.00)

## 2021-05-19 LAB — TSH: TSH: 1.43 u[IU]/mL (ref 0.35–5.50)

## 2021-05-19 NOTE — Progress Notes (Signed)
Patient ID: Antonio Russell, male   DOB: 17-Dec-1960, 61 y.o.   MRN: [id number]   This visit occurred during the SARS-CoV-2 public health emergency.  Safety protocols were in place, including screening questions prior to the visit, additional usage of staff PPE, and extensive cleaning of exam room while observing appropriate contact time as indicated for disinfecting solutions.   HPI  Antonio Russell is a 61 y.o.-year-old male, initially referred by his PCP, Dr. Glori Bickers, returning for follow-up for hypercalcemia/hyperparathyroidism and vitamin D deficiency.  Last visit 1 year ago.  Interim history: He feels well and has no complaints today  other than fatigue, as he is working a lot, doing overtime.  After his parathyroid surgery in 02/2020, he started to feel better and had no more kidney stones.  Reviewed and addended history: She has had hypercalcemia since at least 08/2019.  Reviewed pertinent labs: Postop calcium: 9.5, PTH 28 Lab Results  Component Value Date   PTH 39 05/16/2020   PTH Comment 05/16/2020   PTH 60 06/12/2019   PTH 133 (H) 05/01/2019   PTH 137 (H) 01/13/2019   CALCIUM 9.2 08/18/2020   CALCIUM 9.0 06/15/2020   CALCIUM 9.0 06/13/2020   CALCIUM 9.5 05/16/2020   CALCIUM 9.4 05/16/2020   CALCIUM 9.5 02/13/2020   CALCIUM 10.7 (H) 02/09/2020   CALCIUM 10.3 10/26/2019   CALCIUM 10.5 (H) 09/15/2019   CALCIUM 10.6 (H) 08/13/2019   No history of osteoporosis/fracture/falls.  Phosphorus was low:  Lab Results  Component Value Date   PHOS 2.1 (L) 06/12/2019   PHOS 3.0 05/01/2019    Further investigation for hyperparathyroidism: Component     Latest Ref Rng & Units 06/12/2019          Vitamin D 1, 25 (OH) Total     18 - 72 pg/mL 120 (H)  Vitamin D3 1, 25 (OH)     pg/mL 68  Vitamin D2 1, 25 (OH)     pg/mL 52  Magnesium     1.5 - 2.5 mg/dL 2.2  Creatinine, 24H Ur     0.50 - 2.15 g/24 h 1.56  Calcium, 24H Urine     mg/24 h 401 (H)  FECa = 0.02%  After the above  labs returns, I referred him to surgery.  He had the following investigations: Technetium sestamibi scan (10/14/2019): Right lower pole possible parathyroid adenoma  Thyroid ultrasound (10/14/2019): Small thyroid nodules: Parenchymal Echotexture: Normal Isthmus: Normal in size measuring 0.4 cm in diameter Right lobe: Normal in size measuring 4.8 x 2.0 x 1.6 cm Left lobe: Normal in size measuring 4.3 x 1.9 x 1.6 cm _________________________________________________________   Nodule # 1: Location: Right; Mid Maximum size: 1.2 cm; Other 2 dimensions: 1.2 x 0.8 cm Composition: solid/almost completely solid (2) Echogenicity: hypoechoic (2)   *Given size (>/= 1 - 1.4 cm) and appearance, a follow-up ultrasound in 1 year should be considered based on TI-RADS criteria. _________________________________________________________   Nodule # 2: Location: Right; Inferior  Maximum size: 1.0 cm; Other 2 dimensions: 0.8 x 0.8 cm Composition: solid/almost completely solid (2) Echogenicity: hypoechoic (2)   *Given size (>/= 1 - 1.4 cm) and appearance, a follow-up ultrasound in 1 year should be considered based on TI-RADS criteria. _________________________________________________________   There are 2 punctate, approximately 0.8 cm) hypoechoic nodules within the mid (labeled 3) and inferior (labeled 4) aspects the left lobe of the thyroid, neither of which meet criteria to recommend percutaneous sampling or continued dedicated follow-up.   No  definitive extra thyroidal nodules are identified to suggest the presence of a parathyroid adenoma.   IMPRESSION: 1. Findings suggestive of multinodular goiter. 2. Nodules #1 and #2 both meet imaging criteria to recommend a 1 year follow-up as indicated. 3. No definitive extra thyroidal nodules to suggest the presence of a parathyroid adenoma.  4D CT (11/06/2019): Carotid stenosis but no convincing parathyroid lesion.   Since then, he saw vascular  surgery on 12/25/2019 >> mild >> he was cleared for surgery.  She did not have the surgery yet.  She sees vascular again next months.  He had right inferior parathyroid exploratory parathyroidectomy with Dr. Harlow Asa on 02/12/2020. Pathology showed a 1.1 cm, 0.464 g hypercellular mass, consistent with adenoma.  Calcium decreased appropriately postop.  Latest BUN/Cr: Lab Results  Component Value Date   BUN 18 08/18/2020   BUN 21 (H) 06/15/2020   CREATININE 1.09 08/18/2020   CREATININE 1.00 06/15/2020   He has a history of vitamin D deficiency: Lab Results  Component Value Date   VD25OH 62.37 05/16/2020   VD25OH 29.1 (L) 01/05/2020   VD25OH 27.43 (L) 06/05/2019   VD25OH 19.52 (L) 05/01/2019   VD25OH 15.29 (L) 01/13/2019   At last visit he was on vitamin D 5000 units daily, but he tells me that he stopped this 6 mo ago and is now only on a MVI.    No family history of hypercalcemia or pituitary tumors.  He has a half sister with thyroid cancer.  Latest TSH was normal: Lab Results  Component Value Date   TSH 1.291 05/16/2020   Pt. also has a history of L posterior thigh muscles pain and weakness 4 years ago >> now bilateral >> also anterior aspects of thighs. He also has a h/o chest pain - s/p catheterization, HTN, HL - on Praluent, high WBC. Metoprolol changed to Losartan since last OV - 2/2 ED.  He is not on any steroids or taking any biotin.  ROS: + See HPI  I reviewed pt's medications, allergies, PMH, social hx, family hx, and changes were documented in the history of present illness. Otherwise, unchanged from my initial visit note.  Past Medical History:  Diagnosis Date   Allergic rhinitis    Allergy    Anxiety    Arthritis    Back pain    CAD (coronary artery disease)    a. 2012 Cath: nonobstructive, nl LV fxn, rec aggressive med management; b. 05/2020 Cath/PCI: LM nl, LAD 49m, RI mod diff dzs, LCX 65p/m, OM3 20, RCA 30p, 61m (2.5x12 Resolute Onyx DES).   GERD  (gastroesophageal reflux disease)    Headache(784.0)    History of kidney stones    HLD (hyperlipidemia)    Hyperparathyroidism (HCC)    Hypertension    Impotence of organic origin    Other testicular hypofunction    Peripheral vascular disease (HCC)    Personal history of urinary calculi    Sleep apnea    no c-pap or bi-pap   Thyroid disease    Hyperparathyroidism   Tobacco use disorder    Unspecified hearing loss    Past Surgical History:  Procedure Laterality Date   CARDIAC CATHETERIZATION     COLONOSCOPY     CORONARY STENT INTERVENTION N/A 06/14/2020   Procedure: CORONARY STENT INTERVENTION;  Surgeon: Nelva Bush, MD;  Location: [facility];  Service: Cardiovascular;  Laterality: N/A;   ENDOVASCULAR REPAIR/STENT GRAFT N/A 08/12/2019   Procedure: ENDOVASCULAR REPAIR/STENT GRAFT;  Surgeon: Algernon Huxley,  MD;  Location: [place] [facility];  Service: Cardiovascular;  Laterality: N/A;   EXTRACORPOREAL SHOCK WAVE LITHOTRIPSY Right 10/29/2019   Procedure: EXTRACORPOREAL SHOCK WAVE LITHOTRIPSY (ESWL);  Surgeon: Billey Co, MD;  Location: [facility];  Service: Urology;  Laterality: Right;   HEMORRHOID SURGERY     HEMORRHOID SURGERY     KNEE SURGERY Right 2013   LEFT HEART CATH AND CORONARY ANGIOGRAPHY Left 06/14/2020   Procedure: LEFT HEART CATH AND CORONARY ANGIOGRAPHY;  Surgeon: Nelva Bush, MD;  Location: [facility];  Service: Cardiovascular;  Laterality: Left;   NASAL SEPTUM SURGERY     PARATHYROID EXPLORATION N/A 02/12/2020   Procedure: PARATHYROID EXPLORATION WITH PARATHROIDECTOMY;  Surgeon: Armandina Gemma, MD;  Location: [facility];  Service: General;  Laterality: N/A;   Social History   Socioeconomic History   Marital status: Married    Spouse name: Not on file   Number of children: 3   Years of education: Not on file   Highest education level: Not on file  Occupational History    Glass blower/designer  Tobacco Use   Smoking status: Current Every  Day Smoker    Packs/day: 0.50    Years: 35.00    Pack years: 17.50    Types: Cigarettes   Smokeless tobacco: Former Systems developer  Substance and Sexual Activity   Alcohol use: Yes    Alcohol/week:  3-4 beers 5 days a week       Drug use: No   Sexual activity: Not on file  Other Topics Concern   Not on file  Social History Narrative   Coaches basketball   Social Determinants of Health   Financial Resource Strain:    Difficulty of Paying Living Expenses: Not on file  Food Insecurity:    Worried About [place] in the Last Year: Not on file   Ran Out of Food in the Last Year: Not on file  Transportation Needs:    Lack of Transportation (Medical): Not on file   Lack of Transportation (Non-Medical): Not on file  Physical Activity:    Days of Exercise per Week: Not on file   Minutes of Exercise per Session: Not on file  Stress:    Feeling of Stress : Not on file  Social Connections:    Frequency of Communication with Friends and Family: Not on file   Frequency of Social Gatherings with Friends and Family: Not on file   Attends Religious Services: Not on file   Active Member of Clubs or Organizations: Not on file   Attends Archivist Meetings: Not on file   Marital Status: Not on file  Intimate Partner Violence:    Fear of Current or Ex-Partner: Not on file   Emotionally Abused: Not on file   Physically Abused: Not on file   Sexually Abused: Not on file   Current Outpatient Medications on File Prior to Visit  Medication Sig Dispense Refill   aspirin 81 MG chewable tablet Chew 1 tablet (81 mg total) by mouth daily. (Patient not taking: Reported on 04/20/2021)     Cholecalciferol (DIALYVITE VITAMIN D 5000) 125 MCG (5000 UT) capsule Take 5,000 Units by mouth daily. (Patient not taking: Reported on 04/20/2021)     clopidogrel (PLAVIX) 75 MG tablet TAKE 1 TABLET (75 MG TOTAL) BY MOUTH DAILY AT 6 (SIX) AM. 90 tablet 1   Evolocumab (REPATHA SURECLICK) 604 MG/ML SOAJ  Inject 1 pen into the skin every 14 (fourteen) days. 2 mL  11   fexofenadine (ALLEGRA) 180 MG tablet Take 180 mg by mouth daily.      fluticasone (FLONASE) 50 MCG/ACT nasal spray SPRAY 2 SPRAYS INTO EACH NOSTRIL EVERY DAY 16 g 5   losartan (COZAAR) 50 MG tablet Take 1 tablet (50 mg total) by mouth daily. 90 tablet 3   metoprolol succinate (TOPROL-XL) 25 MG 24 hr tablet Take 0.5 tablets (12.5 mg total) by mouth daily. (Patient not taking: Reported on 04/20/2021) 30 tablet 6   nitroGLYCERIN (NITROSTAT) 0.4 MG SL tablet PLACE 1 TABLET UNDER THE TONGUE EVERY 5 MINUTES AS NEEDED FOR CHEST PAIN. 25 tablet 0   No current facility-administered medications on file prior to visit.   Allergies  Allergen Reactions   Sertraline Hcl Other (See Comments)    Tremors   Amlodipine Other (See Comments)    Sexual side effects   Crestor [Rosuvastatin Calcium] Other (See Comments)    Myalgia   Lipitor [Atorvastatin Calcium] Other (See Comments)    Myalgia   Zocor [Simvastatin - High Dose] Other (See Comments)    Myalgia    Family History  Problem Relation Age of Onset   Hypertension Mother    Hyperlipidemia Mother    Diabetes Mother    Hypertension Father    Hyperlipidemia Father    Cancer Father        throat   Hypertension Sister    Lung cancer Other        GF   Colon cancer Other        dx "close to 37"   Heart disease Other        GM   Esophageal cancer Paternal Uncle    Stomach cancer Neg Hx    Rectal cancer Neg Hx    PE: BP (!) 150/100 (BP Location: Right Arm, Patient Position: Sitting, Cuff Size: Normal)    Pulse 90    Ht 6' (1.829 m)    Wt 193 lb 6.4 oz (87.7 kg)    SpO2 97%    BMI 26.23 kg/m  Wt Readings from Last 3 Encounters:  05/19/21 193 lb 6.4 oz (87.7 kg)  11/15/20 200 lb (90.7 kg)  08/19/20 201 lb (91.2 kg)   Constitutional: overweight, in NAD Eyes: PERRLA, EOMI, no exophthalmos ENT: moist mucous membranes, no neck masses palpated, surgical scar healing, no cervical  lymphadenopathy Cardiovascular: RRR, No MRG Respiratory: CTA B Musculoskeletal: no deformities, strength intact in all 4 Skin: moist, warm, no rashes Neurological: no tremor with outstretched hands, DTR normal in all 4  Assessment: 1. Hypercalcemia/hyperparathyroidism  2.  Vitamin D deficiency  3.  Thyroid nodules  Plan: Patient has had several instances of elevated calcium, with the highest level being at 11.9.  An intact PTH level was also high, at 137 for calcium of 11.1-11.5.  -He has a history of kidney stones and had to have ESWL.  No known history of osteoporosis or fractures. -We investigated him for primary hyperparathyroidism and the investigation was positive.  He had a high calcium, high PTH, a high 24-hour urine calcium with a  fractional excretion of calcium higher than 0.01, low phosphorus, normal magnesium and high calcitriol. -I referred him to Dr. Harlow Asa with surgery.  He had a technetium sestamibi scan which showed a possible right lower pole adenoma.  Of note, a thyroid ultrasound and the 4D CT did not show an adenoma. -Before last visit, he had parathyroid exploratory surgery on 02/12/2020 and a right inferior parathyroid gland was resected.  Pathology showed a 1.1 cm, 0.464 g, hypercellular mass consistent with adenoma.  His calcium decreased from 10.7 preop to 9.5. PTH decreased from 133 to 28.  At last visit, PTH was 39 and calcium was 9.4, both normal. -No more kidney stones episode since then -At today's visit, we will recheck a calcium, PTH, and vitamin D  2.  Vitamin D deficiency -He has a history of vitamin D deficiency with a level of 15 in 01/2019, when he was started on 50,000 units ergocalciferol weekly.  We then switched to a daily vitamin D supplement, which he is taking 5000 units daily.  At last visit we discussed about taking it every day. -Vitamin D level at that time was excellent, at 62.37 -We will recheck the level today  3.  Thyroid nodules -No  neck compression symptoms -Latest TSH is normal: Lab Results  Component Value Date   TSH 1.291 05/16/2020  -We will repeat a  thyroid ultrasound now -we will recheck a TSH now  Component     Latest Ref Rng & Units 05/19/2021          Calcium     8.6 - 10.2 mg/dL 9.3  PTH, Intact     15 - 65 pg/mL 35  PTH Interp      Comment  TSH     0.35 - 5.50 uIU/mL 1.43  VITD     30.00 - 100.00 ng/mL 33.22  All labs are at goal.  Philemon Kingdom, MD PhD [facility]

## 2021-05-19 NOTE — Patient Instructions (Signed)
Please stop at the lab.  Please schedule a thyroid ultrasound.  Please come back for another visit in 1 year.

## 2021-05-20 LAB — PTH, INTACT AND CALCIUM
Calcium: 9.3 mg/dL (ref 8.6–10.2)
PTH: 35 pg/mL (ref 15–65)

## 2021-05-22 ENCOUNTER — Other Ambulatory Visit: Payer: Self-pay

## 2021-05-22 ENCOUNTER — Ambulatory Visit (INDEPENDENT_AMBULATORY_CARE_PROVIDER_SITE_OTHER): Payer: BC Managed Care – PPO | Admitting: Medical

## 2021-05-22 ENCOUNTER — Encounter: Payer: Self-pay | Admitting: Medical

## 2021-05-22 VITALS — BP 130/100 | HR 100 | Ht 72.0 in | Wt 191.0 lb

## 2021-05-22 DIAGNOSIS — I739 Peripheral vascular disease, unspecified: Secondary | ICD-10-CM

## 2021-05-22 DIAGNOSIS — I10 Essential (primary) hypertension: Secondary | ICD-10-CM

## 2021-05-22 DIAGNOSIS — I2510 Atherosclerotic heart disease of native coronary artery without angina pectoris: Secondary | ICD-10-CM

## 2021-05-22 DIAGNOSIS — E785 Hyperlipidemia, unspecified: Secondary | ICD-10-CM | POA: Diagnosis not present

## 2021-05-22 NOTE — Patient Instructions (Signed)
Medication Instructions:   Your physician recommends that you continue on your current medications as directed. Please refer to the Current Medication list given to you today.   *If you need a refill on your cardiac medications before your next appointment, please call your pharmacy*   Lab Work: None ordered  If you have labs (blood work) drawn today and your tests are completely normal, you will receive your results only by: [place] (if you have MyChart) OR A paper copy in the mail If you have any lab test that is abnormal or we need to change your treatment, we will call you to review the results.   Testing/Procedures: None ordered   Follow-Up: At [facility], you and your health needs are our priority.  As part of our continuing mission to provide you with exceptional heart care, we have created designated Provider Care Teams.  These Care Teams include your primary Cardiologist (physician) and Advanced Practice Providers (APPs -  Physician Assistants and Nurse Practitioners) who all work together to provide you with the care you need, when you need it.  We recommend signing up for the patient portal called "MyChart".  Sign up information is provided on this After Visit Summary.  MyChart is used to connect with patients for Virtual Visits (Telemedicine).  Patients are able to view lab/test results, encounter notes, upcoming appointments, etc.  Non-urgent messages can be sent to your provider as well.   To learn more about what you can do with MyChart, go to [email or web address].    Your next appointment:   6 month(s)  The format for your next appointment:   In Person  Provider:   You may see Kate Sable, MD or one of the following Advanced Practice Providers on your designated Care Team:   Murray Hodgkins, NP Christell Faith, PA-C Cadence Kathlen Mody, [place]   Other Instructions N/A

## 2021-05-22 NOTE — Progress Notes (Signed)
Cardiology Office Note:    Date:  05/22/2021   ID:  ORVIL FARAONE, DOB Oct 08, 1960, MRN [id number]  PCP:  Abner Greenspan, MD  [facility] Cardiologist:  Kate Sable, MD  [facility] Electrophysiologist:  None   Referring MD: Abner Greenspan, MD   Chief Complaint: Overdue for refills  History of Present Illness:    Antonio Russell is a 61 y.o. male with a hx of  CAD/PCI to [facility] 05/2020 (LAD 50%, mLCx 60-70%), hyperlipidemia, hypertension, former smoker x40+ years, AAA s/p endoprosthetic repair 08/2019,  peripheral arterial disease s/p R and L iliac stents, sleep apnea who presents for follow-up.    Previously seen for chest pain, work-up with coronary CTA showed significant stenosis.  Underwent left heart cath with significant RCA disease, stent was placed to the mid RCA.  LAD had 50% stenosis, left circumflex 60 to 70% disease.  Echo 05/2020 showed normal systolic and diastolic function EF 26-20%.  The patient was last seen 08/19/20 and was doing well from a cardiac perspective.   Today, patient reports he has been doing well since the last time he was seen. No concerns. He works a lot, about 60 hours a week. BP today 130/100. The patient reports BP is normally 130/80s. EKG showed ST with 100bpm. HR 90s during the visit. Also, reports that his insurance changed. Requesting if we can get Repatha approved. He is taking Plavix, no bleeding issues with this. No chest pain, SOB, LLE, orthopnea, pnd, lightheadedness, dizziness.  He will see [facility] next month.   Past Medical History:  Diagnosis Date   Allergic rhinitis    Allergy    Anxiety    Arthritis    Back pain    CAD (coronary artery disease)    a. 2012 Cath: nonobstructive, nl LV fxn, rec aggressive med management; b. 05/2020 Cath/PCI: LM nl, LAD 44m, RI mod diff dzs, LCX 65p/m, OM3 20, RCA 30p, 19m (2.5x12 Resolute Onyx DES).   GERD (gastroesophageal reflux disease)    Headache(784.0)    History of kidney stones    HLD  (hyperlipidemia)    Hyperparathyroidism (HCC)    Hypertension    Impotence of organic origin    Other testicular hypofunction    Peripheral vascular disease (HCC)    Personal history of urinary calculi    Sleep apnea    no c-pap or bi-pap   Thyroid disease    Hyperparathyroidism   Tobacco use disorder    Unspecified hearing loss     Past Surgical History:  Procedure Laterality Date   CARDIAC CATHETERIZATION     COLONOSCOPY     CORONARY STENT INTERVENTION N/A 06/14/2020   Procedure: CORONARY STENT INTERVENTION;  Surgeon: Nelva Bush, MD;  Location: [place] [facility];  Service: Cardiovascular;  Laterality: N/A;   ENDOVASCULAR REPAIR/STENT GRAFT N/A 08/12/2019   Procedure: ENDOVASCULAR REPAIR/STENT GRAFT;  Surgeon: Algernon Huxley, MD;  Location: [place] [facility];  Service: Cardiovascular;  Laterality: N/A;   EXTRACORPOREAL SHOCK WAVE LITHOTRIPSY Right 10/29/2019   Procedure: EXTRACORPOREAL SHOCK WAVE LITHOTRIPSY (ESWL);  Surgeon: Billey Co, MD;  Location: [facility];  Service: Urology;  Laterality: Right;   HEMORRHOID SURGERY     HEMORRHOID SURGERY     KNEE SURGERY Right 2013   LEFT HEART CATH AND CORONARY ANGIOGRAPHY Left 06/14/2020   Procedure: LEFT HEART CATH AND CORONARY ANGIOGRAPHY;  Surgeon: Nelva Bush, MD;  Location: [place] [facility];  Service: Cardiovascular;  Laterality: Left;  NASAL SEPTUM SURGERY     PARATHYROID EXPLORATION N/A 02/12/2020   Procedure: PARATHYROID EXPLORATION WITH PARATHROIDECTOMY;  Surgeon: Armandina Gemma, MD;  Location: [facility];  Service: General;  Laterality: N/A;    Current Medications: Current Meds  Medication Sig   Cholecalciferol (DIALYVITE VITAMIN D 5000) 125 MCG (5000 UT) capsule Take 5,000 Units by mouth daily.   clopidogrel (PLAVIX) 75 MG tablet TAKE 1 TABLET (75 MG TOTAL) BY MOUTH DAILY AT 6 (SIX) AM.   fexofenadine (ALLEGRA) 180 MG tablet Take 180 mg by mouth daily.    fluticasone (FLONASE) 50 MCG/ACT nasal spray SPRAY 2  SPRAYS INTO EACH NOSTRIL EVERY DAY   losartan (COZAAR) 50 MG tablet Take 1 tablet (50 mg total) by mouth daily.   nitroGLYCERIN (NITROSTAT) 0.4 MG SL tablet PLACE 1 TABLET UNDER THE TONGUE EVERY 5 MINUTES AS NEEDED FOR CHEST PAIN.     Allergies:   Sertraline hcl, Amlodipine, Crestor [rosuvastatin calcium], Lipitor [atorvastatin calcium], and Zocor [simvastatin - high dose]   Social History   Socioeconomic History   Marital status: Married    Spouse name: Not on file   Number of children: Not on file   Years of education: Not on file   Highest education level: Not on file  Occupational History   Not on file  Tobacco Use   Smoking status: Former    Packs/day: 0.25    Years: 35.00    Pack years: 8.75    Types: Cigarettes    Quit date: 10/08/2019    Years since quitting: 1.6   Smokeless tobacco: Former    Types: Chew, Snuff   Tobacco comments:    couple of months - 53 yrs ago  Vaping Use   Vaping Use: Former  Substance and Sexual Activity   Alcohol use: Yes    Alcohol/week: 4.0 - 5.0 standard drinks    Types: 4 - 5 Cans of beer per week    Comment: daily    Drug use: No   Sexual activity: Yes    Birth control/protection: None  Other Topics Concern   Not on file  Social History Narrative   Coaches basketball   Social Determinants of Health   Financial Resource Strain: Not on file  Food Insecurity: Not on file  Transportation Needs: Not on file  Physical Activity: Not on file  Stress: Not on file  Social Connections: Not on file     Family History: The patient's family history includes Cancer in his father; Colon cancer in an other family member; Diabetes in his mother; Esophageal cancer in his paternal uncle; Heart disease in an other family member; Hyperlipidemia in his father and mother; Hypertension in his father, mother, and sister; Lung cancer in an other family member. There is no history of Stomach cancer or Rectal cancer.  ROS:   Please see the history of  present illness.     All other systems reviewed and are negative.  EKGs/Labs/Other Studies Reviewed:    The following studies were reviewed today:  Cardiac cath 05/2020 Conclusions: Multivessel coronary artery disease including 50% mid LAD stenosis, 60 to 70% proximal/mid LCx disease, and 95% mid RCA lesion. Normal left ventricular filling pressure. Successful PCI to mid RCA using Resolute Onyx 2.5 x 12 mm drug-eluting stent with 0% residual stenosis and TIMI-3 flow.   Recommendations: Dual antiplatelet therapy with aspirin and clopidogrel for at least 6 months. Aggressive secondary prevention. Medical therapy of LCx and LAD disease.  I will add metoprolol succinate  for antianginal therapy.  If the patient has continued chest pain refractory to maximal tolerated doses of at least 2 antianginal agents, PCI to LCx could be considered.  If LCx PCI is pursued, it may be best to perform this at [facility] in case atherectomy/lithotripsy is necessary for treatment of underlying calcification. Overnight observation with anticipated discharge home tomorrow.   Nelva Bush, MD [facility]  Coronary Diagrams  Diagnostic Dominance: Right Intervention    Echo 05/2020 1. Left ventricular ejection fraction, by estimation, is 55 to 60%. The  left ventricle has normal function. The left ventricle has no regional  wall motion abnormalities. Left ventricular diastolic parameters were  normal.   2. Right ventricular systolic function is normal. The right ventricular  size is normal.   3. The mitral valve is normal in structure. No evidence of mitral valve  regurgitation.   4. The aortic valve is grossly normal. Aortic valve regurgitation is not  visualized.   5. The inferior vena cava is normal in size with greater than 50%  respiratory variability, suggesting right atrial pressure of 3 mmHg.   EKG:  EKG is  ordered today.  The ekg ordered today demonstrates NSR, 100bpm, no ST/T wave  changes  Recent Labs: 06/15/2020: Hemoglobin 14.5; Platelets 211 08/18/2020: ALT 31; BUN 18; Creatinine, Ser 1.09; Potassium 4.7; Sodium 139 05/19/2021: TSH 1.43  Recent Lipid Panel    Component Value Date/Time   CHOL 206 (H) 08/18/2020 0801   TRIG 149 08/18/2020 0801   HDL 61 08/18/2020 0801   CHOLHDL 3.4 08/18/2020 0801   VLDL 30 08/18/2020 0801   LDLCALC 115 (H) 08/18/2020 0801   LDLDIRECT 225.0 05/01/2019 0938    Physical Exam:    VS:  BP (!) 130/100 (BP Location: Left Arm, Patient Position: Sitting, Cuff Size: Large)    Pulse 100    Ht 6' (1.829 m)    Wt 191 lb (86.6 kg)    SpO2 98%    BMI 25.90 kg/m     Wt Readings from Last 3 Encounters:  05/22/21 191 lb (86.6 kg)  05/19/21 193 lb 6.4 oz (87.7 kg)  11/15/20 200 lb (90.7 kg)     GEN:  Well nourished, well developed in no acute distress HEENT: Normal NECK: No JVD; No carotid bruits LYMPHATICS: No lymphadenopathy CARDIAC: RRR, no murmurs, rubs, gallops RESPIRATORY:  Clear to auscultation without rales, wheezing or rhonchi  ABDOMEN: Soft, non-tender, non-distended MUSCULOSKELETAL:  No edema; No deformity  SKIN: Warm and dry NEUROLOGIC:  Alert and oriented x 3 PSYCHIATRIC:  Normal affect   ASSESSMENT:    1. Coronary artery disease involving native coronary artery of native heart without angina pectoris   2. Hyperlipidemia LDL goal <70   3. Essential hypertension   4. PAD (peripheral artery disease) (HCC)    PLAN:    In order of problems listed above:  CAD s/p PCI to mid RCA 05/2020 Patient denies anginal symptoms. EKG stable today. Patient is only on Plavix, as he had bleeding issues on both Aspirin and Plavix. No further ischemic work-up at this time. Continue Repatha. BB previously discontinued for sexual concerns.   HLD Patient had missed many doses of Repatha. He switched insurance and has since been approved and needs to contact his pharmacy for a refill. Patient was informed of this. Can re-check lipid  panel once medication has been restarted.   HTN BP OK, diastolic 096, which patient says is abnormal for him. Says it normally runs in  the 80s. Continue Losartan. Discussed restarting Toprol, especially given HR 90-100, but will wait at this time.   AAA s/p endovascular repair and iliac stent placement PAD He follows with vascular surgery, he has an appointment next month. Continue Plavix and Repatha.   Disposition: Follow up in 6 month(s) with MD/APP      Signed, Forrestine Lecrone Ninfa Meeker, PA-C  05/22/2021 12:03 PM    [facility]

## 2021-05-26 ENCOUNTER — Other Ambulatory Visit: Payer: Self-pay | Admitting: Family Medicine

## 2021-05-26 ENCOUNTER — Other Ambulatory Visit: Payer: BC Managed Care – PPO

## 2021-05-26 ENCOUNTER — Other Ambulatory Visit: Payer: Self-pay

## 2021-05-26 DIAGNOSIS — N401 Enlarged prostate with lower urinary tract symptoms: Secondary | ICD-10-CM

## 2021-05-26 DIAGNOSIS — N138 Other obstructive and reflux uropathy: Secondary | ICD-10-CM | POA: Diagnosis not present

## 2021-05-27 LAB — PSA: Prostate Specific Ag, Serum: 1 ng/mL (ref 0.0–4.0)

## 2021-05-29 NOTE — Progress Notes (Signed)
05/30/2021 2:51 PM   Antonio Russell December 11, 1960 [id number]  Referring provider: Abner Greenspan, MD [address] [place],  [place] [address]  Chief Complaint  Patient presents with   Hematuria   Benign Prostatic Hypertrophy   Erectile Dysfunction   Nephrolithiasis   Urological history: 1. High risk hematuria - former-smoker - CT angio abd/pelvis 09/2019 1.4 cm low-attenuation left adrenal nodule, previously 1.2 cm, presumably benign adenoma. Right adrenal unremarkable. Stable 1.2 cm cyst from the lower pole right kidney.  No hydronephrosis. Urinary bladder incompletely distended.  Mild prostatic enlargement with central coarse calcifications. - cysto 09/2019 Prostate moderate lateral lobe enlargement with hypervascularity and friability - moderate elevation bladder neck - no reports of gross heme - UA neg for micro heme   2. Nephrolithiasis - s/p ESWL for 1 cm right UPJ stone - history of hyperparathyroidism - s/p parathyroidectomy 02/2020 - KUB 05/27/2020 no stones seen   3. BPH with LU TS -PSA 1.0 05/2021 -I PSS 1/0  4. ED -contributing factors of age, CAD, HTN, testosterone deficiency, anxiety, HLD and former smoker -SHIM 27 -not a candidate for PDE5i's - on nitroglycerin   HPI: Antonio Russell is a 61 y.o. who presents today for yearly follow up.    He has no urinary complaints at this visit.  Patient denies any modifying or aggravating factors.  Patient denies any gross hematuria, dysuria or suprapubic/flank pain.  Patient denies any fevers, chills, nausea or vomiting.     IPSS     Row Name 05/30/21 0800         International Prostate Symptom Score   How often have you had the sensation of not emptying your bladder? Not at All     How often have you had to urinate less than every two hours? Not at All     How often have you found you stopped and started again several times when you urinated? Not at All     How often have you found it difficult to postpone  urination? Not at All     How often have you had a weak urinary stream? Not at All     How often have you had to strain to start urination? Not at All     How many times did you typically get up at night to urinate? 1 Time     Total IPSS Score 1       Quality of Life due to urinary symptoms   If you were to spend the rest of your life with your urinary condition just the way it is now how would you feel about that? Delighted               Score:  1-7 Mild 8-19 Moderate 20-35 Severe  He complains of no libido.  Patient still having spontaneous erections.  He denies any pain or curvature with erections.     SHIM     Row Name 05/30/21 0826         SHIM: Over the last 6 months:   How do you rate your confidence that you could get and keep an erection? Very Low     When you had erections with sexual stimulation, how often were your erections hard enough for penetration (entering your partner)? Sometimes (about half the time)     During sexual intercourse, how often were you able to maintain your erection after you had penetrated (entered) your partner? Sometimes (about half the time)  During sexual intercourse, how difficult was it to maintain your erection to completion of intercourse? Difficult     When you attempted sexual intercourse, how often was it satisfactory for you? Sometimes (about half the time)       SHIM Total Score   SHIM 13               Score: 1-7 Severe ED 8-11 Moderate ED 12-16 Mild-Moderate ED 17-21 Mild ED 22-25 No ED  PMH: Past Medical History:  Diagnosis Date   Allergic rhinitis    Allergy    Anxiety    Arthritis    Back pain    CAD (coronary artery disease)    a. 2012 Cath: nonobstructive, nl LV fxn, rec aggressive med management; b. 05/2020 Cath/PCI: LM nl, LAD 21m, RI mod diff dzs, LCX 65p/m, OM3 20, RCA 30p, 39m (2.5x12 Resolute Onyx DES).   GERD (gastroesophageal reflux disease)    Headache(784.0)    History of kidney stones     HLD (hyperlipidemia)    Hyperparathyroidism (HCC)    Hypertension    Impotence of organic origin    Other testicular hypofunction    Peripheral vascular disease (HCC)    Personal history of urinary calculi    Sleep apnea    no c-pap or bi-pap   Thyroid disease    Hyperparathyroidism   Tobacco use disorder    Unspecified hearing loss     Surgical History: Past Surgical History:  Procedure Laterality Date   CARDIAC CATHETERIZATION     COLONOSCOPY     CORONARY STENT INTERVENTION N/A 06/14/2020   Procedure: CORONARY STENT INTERVENTION;  Surgeon: Nelva Bush, MD;  Location: [place] [facility];  Service: Cardiovascular;  Laterality: N/A;   ENDOVASCULAR REPAIR/STENT GRAFT N/A 08/12/2019   Procedure: ENDOVASCULAR REPAIR/STENT GRAFT;  Surgeon: Algernon Huxley, MD;  Location: [place] [facility];  Service: Cardiovascular;  Laterality: N/A;   EXTRACORPOREAL SHOCK WAVE LITHOTRIPSY Right 10/29/2019   Procedure: EXTRACORPOREAL SHOCK WAVE LITHOTRIPSY (ESWL);  Surgeon: Billey Co, MD;  Location: [facility];  Service: Urology;  Laterality: Right;   HEMORRHOID SURGERY     HEMORRHOID SURGERY     KNEE SURGERY Right 2013   LEFT HEART CATH AND CORONARY ANGIOGRAPHY Left 06/14/2020   Procedure: LEFT HEART CATH AND CORONARY ANGIOGRAPHY;  Surgeon: Nelva Bush, MD;  Location: [place] [facility];  Service: Cardiovascular;  Laterality: Left;   NASAL SEPTUM SURGERY     PARATHYROID EXPLORATION N/A 02/12/2020   Procedure: PARATHYROID EXPLORATION WITH PARATHROIDECTOMY;  Surgeon: Armandina Gemma, MD;  Location: [facility];  Service: General;  Laterality: N/A;    Home Medications:  Current Outpatient Medications on File Prior to Visit  Medication Sig Dispense Refill   Cholecalciferol (DIALYVITE VITAMIN D 5000) 125 MCG (5000 UT) capsule Take 5,000 Units by mouth daily.     clopidogrel (PLAVIX) 75 MG tablet TAKE 1 TABLET (75 MG TOTAL) BY MOUTH DAILY AT 6 (SIX) AM. 90 tablet 1   fexofenadine (ALLEGRA) 180  MG tablet Take 180 mg by mouth daily.      fluticasone (FLONASE) 50 MCG/ACT nasal spray SPRAY 2 SPRAYS INTO EACH NOSTRIL EVERY DAY 16 g 5   losartan (COZAAR) 50 MG tablet Take 1 tablet (50 mg total) by mouth daily. 90 tablet 3   nitroGLYCERIN (NITROSTAT) 0.4 MG SL tablet PLACE 1 TABLET UNDER THE TONGUE EVERY 5 MINUTES AS NEEDED FOR CHEST PAIN. 25 tablet 0   No current facility-administered medications on file prior  to visit.    Allergies:  Allergies  Allergen Reactions   Sertraline Hcl Other (See Comments)    Tremors   Amlodipine Other (See Comments)    Sexual side effects   Crestor [Rosuvastatin Calcium] Other (See Comments)    Myalgia   Lipitor [Atorvastatin Calcium] Other (See Comments)    Myalgia   Zocor [Simvastatin - High Dose] Other (See Comments)    Myalgia     Family History: Family History  Problem Relation Age of Onset   Hypertension Mother    Hyperlipidemia Mother    Diabetes Mother    Hypertension Father    Hyperlipidemia Father    Cancer Father        throat   Hypertension Sister    Esophageal cancer Paternal Uncle    Lung cancer Other        GF   Colon cancer Other        dx "close to 45"   Heart disease Other        GM   Stomach cancer Neg Hx    Rectal cancer Neg Hx     Social History:  reports that he quit smoking about 20 months ago. His smoking use included cigarettes. He has a 8.75 pack-year smoking history. He has quit using smokeless tobacco.  His smokeless tobacco use included chew and snuff. He reports current alcohol use of about 4.0 - 5.0 standard drinks per week. He reports that he does not use drugs.  ROS: Pertinent ROS in HPI  Physical Exam: BP (!) 169/101    Pulse 82    Ht 6' (1.829 m)    Wt 191 lb (86.6 kg)    BMI 25.90 kg/m   Constitutional:  Well nourished. Alert and oriented, No acute distress. HEENT: [place] AT, mask in place trachea midline Cardiovascular: No clubbing, cyanosis, or edema. Respiratory: Normal respiratory effort,  no increased work of breathing. GU: No CVA tenderness.  No bladder fullness or masses.  Patient with uncircumcised phallus.  Foreskin easily retracted  Urethral meatus is patent.  No penile discharge. No penile lesions or rashes.  Small stable mole on the dorsal side of the penis.  Scrotum without lesions, cysts, rashes and/or edema.  Testicles are located scrotally bilaterally. No masses are appreciated in the testicles. Left and right epididymis are normal. Rectal: Patient with  normal sphincter tone. Anus and perineum without scarring or rashes. No rectal masses are appreciated. Prostate is approximately 55 grams, could only palpate the apex and the midportion of glands, no nodules are appreciated. Seminal vesicles could not be palpated Neurologic: Grossly intact, no focal deficits, moving all 4 extremities. Psychiatric: Normal mood and affect.   Laboratory Data: Component     Latest Ref Rng & Units 05/27/2020 05/26/2021  Prostate Specific Ag, Serum     0.0 - 4.0 ng/mL 0.7 1.0     Lab Results  Component Value Date   WBC 8.9 06/15/2020   HGB 14.5 06/15/2020   HCT 42.1 06/15/2020   MCV 98.4 06/15/2020   PLT 211 06/15/2020    Lab Results  Component Value Date   CREATININE 1.09 08/18/2020       Component Value Date/Time   CHOL 206 (H) 08/18/2020 0801   HDL 61 08/18/2020 0801   CHOLHDL 3.4 08/18/2020 0801   VLDL 30 08/18/2020 0801   LDLCALC 115 (H) 08/18/2020 0801    Urinalysis Component     Latest Ref Rng & Units 05/30/2021  Specific Gravity, UA  1.005 - 1.030 1.020  pH, UA     5.0 - 7.5 7.0  Color, UA     Yellow Yellow  Appearance Ur     Clear Clear  Leukocytes,UA     Negative Negative  Protein,UA     Negative/Trace Negative  Glucose, UA     Negative Negative  Ketones, UA     Negative Negative  RBC, UA     Negative Negative  Bilirubin, UA     Negative Negative  Urobilinogen, Ur     0.2 - 1.0 mg/dL 0.2  Nitrite, UA     Negative Negative  Microscopic  Examination      See below:   Component     Latest Ref Rng & Units 05/30/2021  WBC, UA     0 - 5 /hpf 0-5  RBC     0 - 2 /hpf 0-2  Epithelial Cells (non renal)     0 - 10 /hpf 0-10  Casts     None seen /lpf Present (A)  Cast Type     N/A Hyaline casts  Bacteria, UA     None seen/Few None seen  I have reviewed the labs.   Pertinent Imaging: N/A   Assessment & Plan:   1. Nephrolithiasis - no stones seen on KUB - serum calcium, PTH are normal  2. High risk hematuria - work up in 09/2019- Hypervascular prostate - UA negative for micro heme  3. BPH with LUTS -PSA stable -DRE benign -UA benign -continue conservative management, avoiding bladder irritants and timed voiding's   4. ED -not a candidate for PDE5i's due to nitroglycerin use -He states he does not use the nitroglycerin and he is wondering if he could take a low-dose tadalafil 5 mg daily, I stated I would have to get clearance through cardiology to prescribe that medication  5. Low libido  -He has a history of testosterone deficiency, but he would like to table this issue for the time being as his wife is undergoing hysterectomy  Return in about 1 year (around 05/30/2022) for IPSS, SHIM, PSA and exam, OAB questionnaire, PVR and exam.  These notes generated with voice recognition software. I apologize for typographical errors.  Zara Council, PA-C  [facility] [address]  [place], [place] [address] [phone]

## 2021-05-30 ENCOUNTER — Encounter: Payer: Self-pay | Admitting: Urology

## 2021-05-30 ENCOUNTER — Other Ambulatory Visit: Payer: Self-pay

## 2021-05-30 ENCOUNTER — Ambulatory Visit (INDEPENDENT_AMBULATORY_CARE_PROVIDER_SITE_OTHER): Payer: BC Managed Care – PPO | Admitting: Urology

## 2021-05-30 VITALS — BP 169/101 | HR 82 | Ht 72.0 in | Wt 191.0 lb

## 2021-05-30 DIAGNOSIS — N529 Male erectile dysfunction, unspecified: Secondary | ICD-10-CM | POA: Diagnosis not present

## 2021-05-30 DIAGNOSIS — N200 Calculus of kidney: Secondary | ICD-10-CM

## 2021-05-30 DIAGNOSIS — R319 Hematuria, unspecified: Secondary | ICD-10-CM | POA: Diagnosis not present

## 2021-05-30 DIAGNOSIS — N401 Enlarged prostate with lower urinary tract symptoms: Secondary | ICD-10-CM

## 2021-05-30 DIAGNOSIS — N138 Other obstructive and reflux uropathy: Secondary | ICD-10-CM

## 2021-05-30 LAB — URINALYSIS, COMPLETE
Bilirubin, UA: NEGATIVE
Glucose, UA: NEGATIVE
Ketones, UA: NEGATIVE
Leukocytes,UA: NEGATIVE
Nitrite, UA: NEGATIVE
Protein,UA: NEGATIVE
RBC, UA: NEGATIVE
Specific Gravity, UA: 1.02 (ref 1.005–1.030)
Urobilinogen, Ur: 0.2 mg/dL (ref 0.2–1.0)
pH, UA: 7 (ref 5.0–7.5)

## 2021-05-30 LAB — MICROSCOPIC EXAMINATION: Bacteria, UA: NONE SEEN

## 2021-05-31 ENCOUNTER — Telehealth: Payer: Self-pay | Admitting: Urology

## 2021-05-31 NOTE — Telephone Encounter (Signed)
Antonio Russell would like to take tadalafil 5 mg daily for ED, but he has nitroglycerin SL listed among his medications.  He states he has not needed to take it, but we still need clearance from cardiology to prescribe this medication.  His cardiologist is Dr. [name].   ?

## 2021-06-01 NOTE — Telephone Encounter (Signed)
.  left message to have patient return my call.  

## 2021-06-05 NOTE — Telephone Encounter (Signed)
REQUEST FOR CARDIAC CLEARANCE     ? ? ?06/05/2021 ? ?Request Clearance from Dr. Tarri Glenn, PA ? ?Faxed to: telephone note ? ? ? ?Provider: Zara Council, PA ? ? ? ?Cardiac  Clearance :  ? ?Reason:  ?  ? ? ?Risk Assessment:  ? ? Low   '[]'$       Moderate   '[]'$     High   '[]'$  ?  ?       ?This patient is optimized for surgery  YES '[]'$       NO   '[]'$  ? ? ?I recommend further assessment/workup prior to procedure:  ? ? YES '[]'$      NO  '[]'$  ? ?Appointment scheduled for: _______________________  ? ?Further recommendations: ______________________________ ? ? ? ?Physician Signature:__________________________________  ? ?Printed Name: ________________________________________  ? ?Date: _________________    ?

## 2021-06-15 ENCOUNTER — Other Ambulatory Visit (INDEPENDENT_AMBULATORY_CARE_PROVIDER_SITE_OTHER): Payer: Self-pay | Admitting: Nurse Practitioner

## 2021-06-15 NOTE — Telephone Encounter (Signed)
Pre-op covering staff, I am a little confused. It looks like Urology is just wanting to know if it is OK to start Tadalafil not clearance for a procedure. Can you confirm this? If so, we can pass this along to his primary Cardiologist. ? ?Thank you! ?

## 2021-06-15 NOTE — Telephone Encounter (Signed)
This is not a pre op clearance, as there is no surgery or procedure addressed. Only here to be addressed is a medication question for Dr. Charlestine Night. I will forward this to MD to please address and reply back to requesting provider. Please do not send this back to pre op or pre op call back.  ?

## 2021-06-16 ENCOUNTER — Telehealth: Payer: Self-pay | Admitting: Urology

## 2021-06-16 ENCOUNTER — Ambulatory Visit (INDEPENDENT_AMBULATORY_CARE_PROVIDER_SITE_OTHER): Payer: 59 | Admitting: Vascular Surgery

## 2021-06-16 ENCOUNTER — Ambulatory Visit (INDEPENDENT_AMBULATORY_CARE_PROVIDER_SITE_OTHER): Payer: BC Managed Care – PPO

## 2021-06-16 ENCOUNTER — Other Ambulatory Visit: Payer: Self-pay

## 2021-06-16 ENCOUNTER — Ambulatory Visit
Admission: RE | Admit: 2021-06-16 | Discharge: 2021-06-16 | Disposition: A | Discharge status: Home / Self Care | Payer: BC Managed Care – PPO | Source: Ambulatory Visit | Attending: Internal Medicine | Admitting: Internal Medicine

## 2021-06-16 DIAGNOSIS — I6523 Occlusion and stenosis of bilateral carotid arteries: Secondary | ICD-10-CM | POA: Diagnosis not present

## 2021-06-16 DIAGNOSIS — I739 Peripheral vascular disease, unspecified: Secondary | ICD-10-CM

## 2021-06-16 DIAGNOSIS — N138 Other obstructive and reflux uropathy: Secondary | ICD-10-CM

## 2021-06-16 DIAGNOSIS — I714 Abdominal aortic aneurysm, without rupture, unspecified: Secondary | ICD-10-CM | POA: Diagnosis not present

## 2021-06-16 DIAGNOSIS — N529 Male erectile dysfunction, unspecified: Secondary | ICD-10-CM

## 2021-06-16 DIAGNOSIS — E042 Nontoxic multinodular goiter: Secondary | ICD-10-CM

## 2021-06-16 MED ORDER — TADALAFIL 5 MG PO TABS: 5.0000 mg | ORAL_TABLET | Freq: Every day | ORAL | 11 refills | Status: DC

## 2021-06-16 NOTE — Telephone Encounter (Signed)
Pt returned call on triage line, advised pt of the information below. He states that he is willing to d/c nitroglycerin. RX for tadalafil sent to [facility]. Pt advised to use Single[employer] coupon card.  ?

## 2021-06-16 NOTE — Telephone Encounter (Signed)
.  left message to have patient return my call.  

## 2021-06-16 NOTE — Telephone Encounter (Signed)
Please let Antonio Russell know that as long as his nitroglycerin use is discontinued, we can go ahead and prescribe tadalafil 5 mg daily for him.  If he is agreeable to stop the nitroglycerin and proceed with taking the tadalafil daily, which pharmacy would he like [place] to send the medication to? ?

## 2021-08-01 DIAGNOSIS — J301 Allergic rhinitis due to pollen: Secondary | ICD-10-CM | POA: Diagnosis not present

## 2021-12-22 ENCOUNTER — Ambulatory Visit (INDEPENDENT_AMBULATORY_CARE_PROVIDER_SITE_OTHER): Payer: 59 | Admitting: Vascular Surgery

## 2021-12-22 ENCOUNTER — Encounter (INDEPENDENT_AMBULATORY_CARE_PROVIDER_SITE_OTHER): Payer: 59

## 2022-01-29 ENCOUNTER — Encounter (INDEPENDENT_AMBULATORY_CARE_PROVIDER_SITE_OTHER): Payer: Self-pay

## 2022-02-13 IMAGING — US US ABDOMEN COMPLETE
1 series · 15 of 25 positions shown · non-contrast
Comparison: Abdominal CTA 07/24/2019

CLINICAL DATA: Right upper quadrant pain after eating. Pain for 3
weeks.

EXAM:
ABDOMEN ULTRASOUND COMPLETE

[Series 1: us abdomen complete · 15 of 92 slices shown]
[im 1/92]
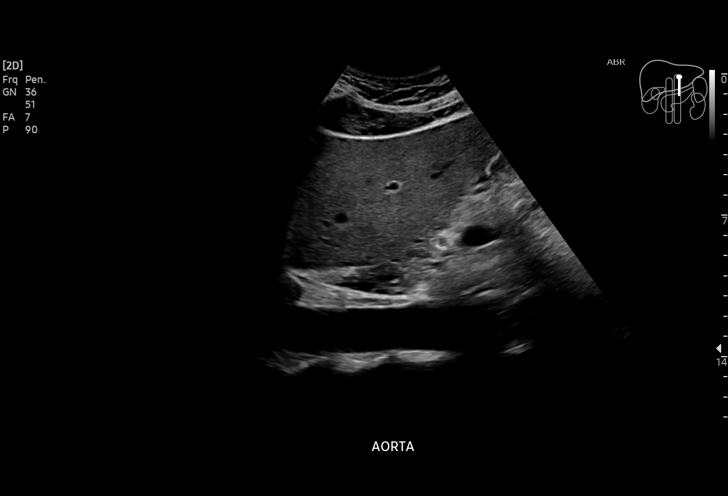
[im 8/92]
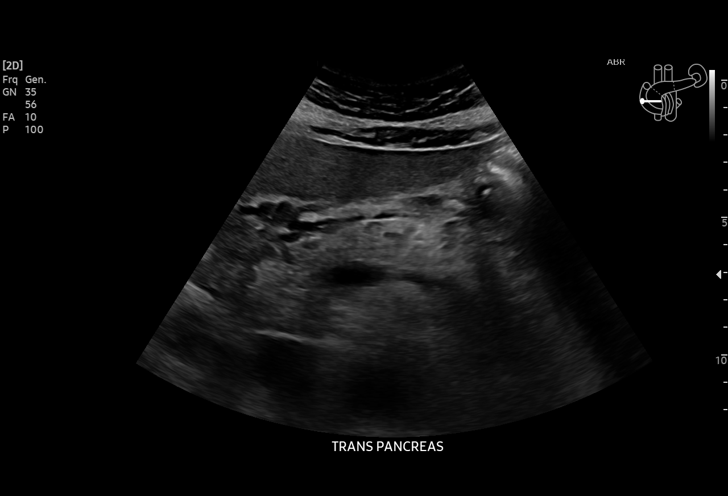
[im 16/92]
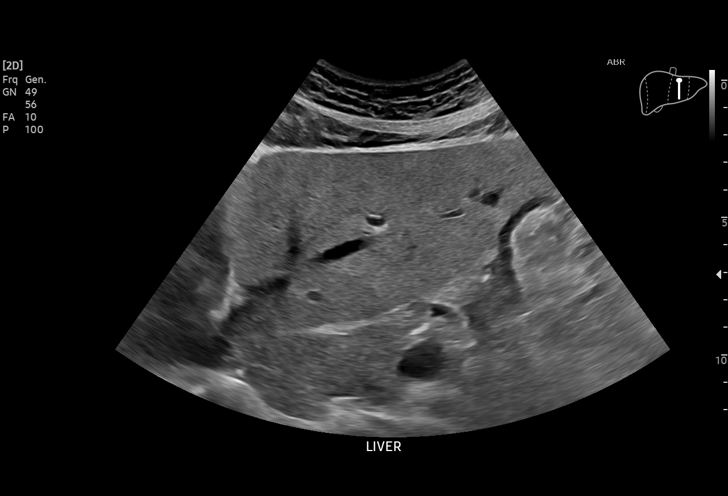
[im 19/92]
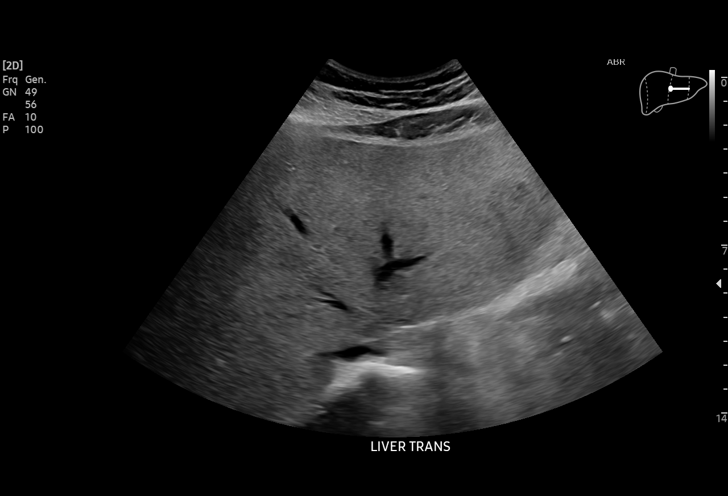
[im 27/92]
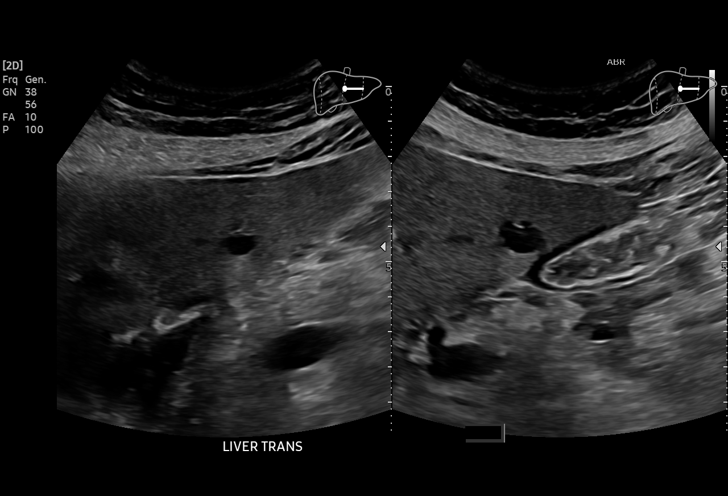
[im 35/92]
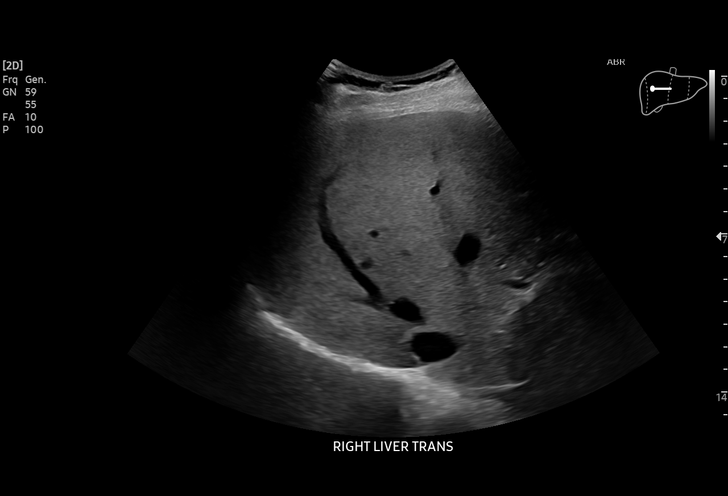
[im 38/92]
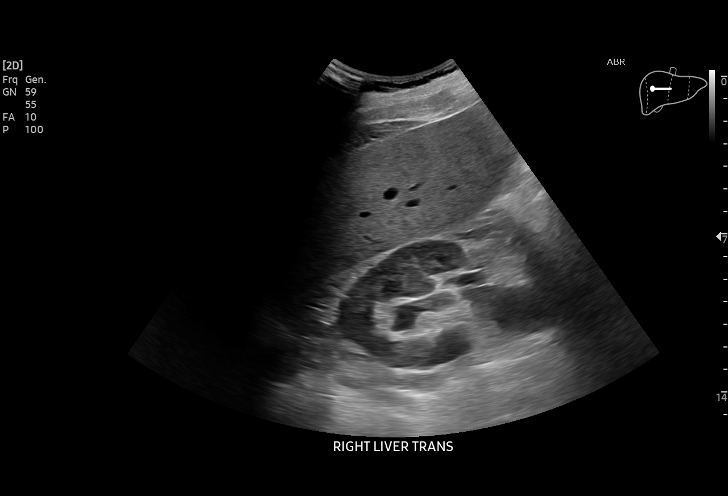
[im 46/92]
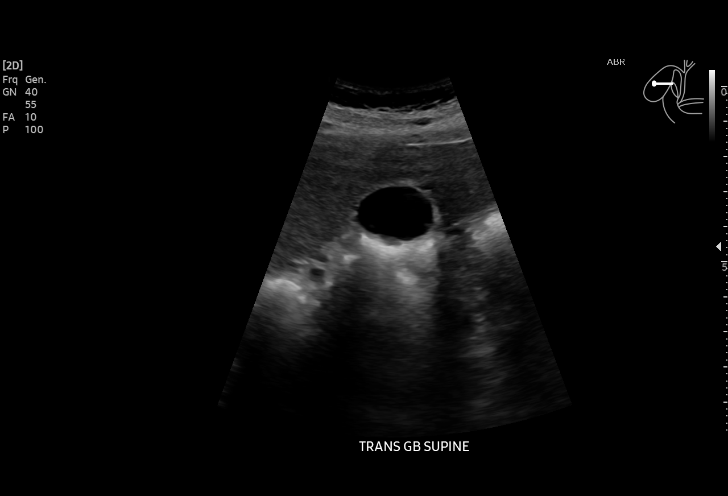
[im 54/92]
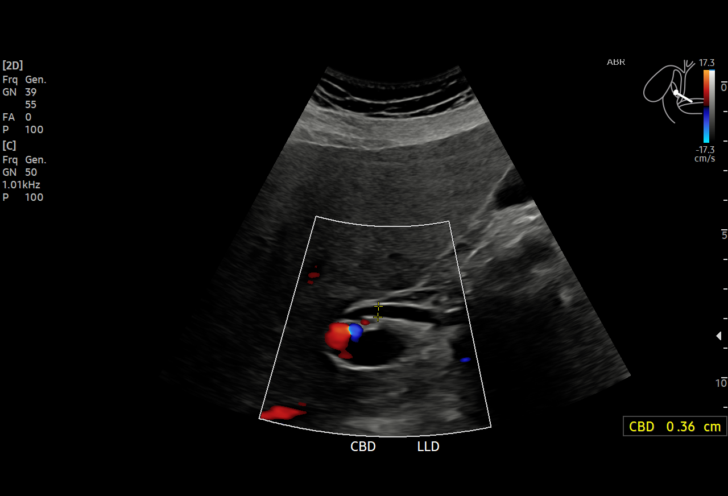
[im 57/92]
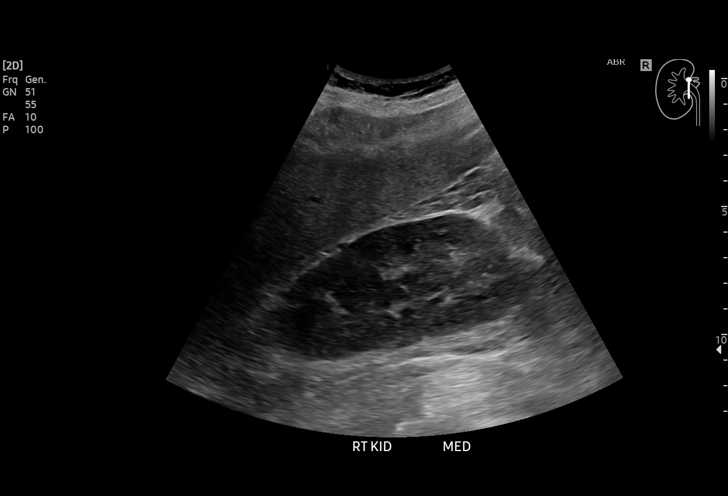
[im 65/92]
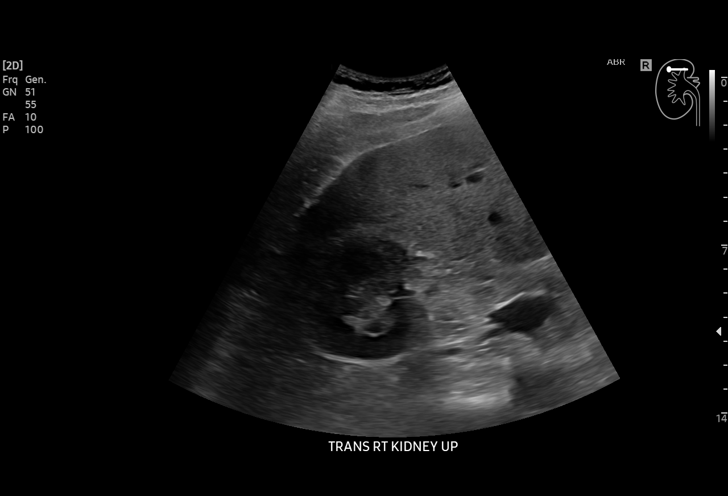
[im 73/92]
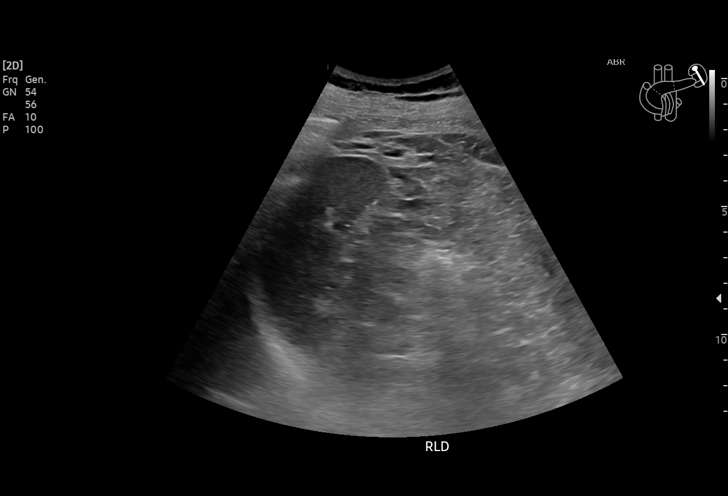
[im 76/92]
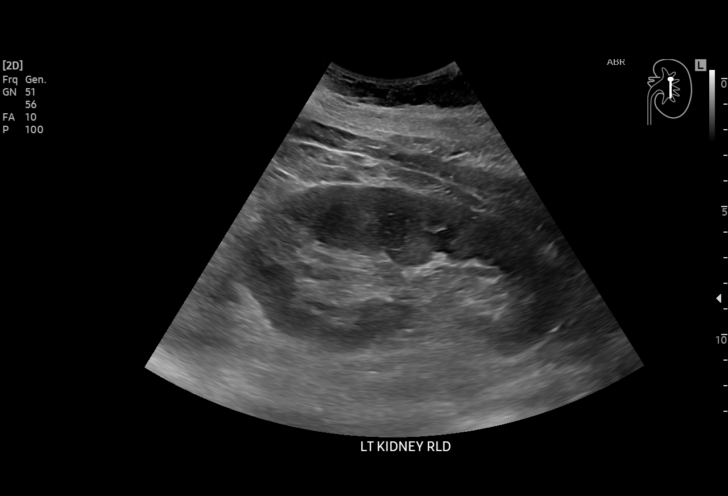
[im 84/92]
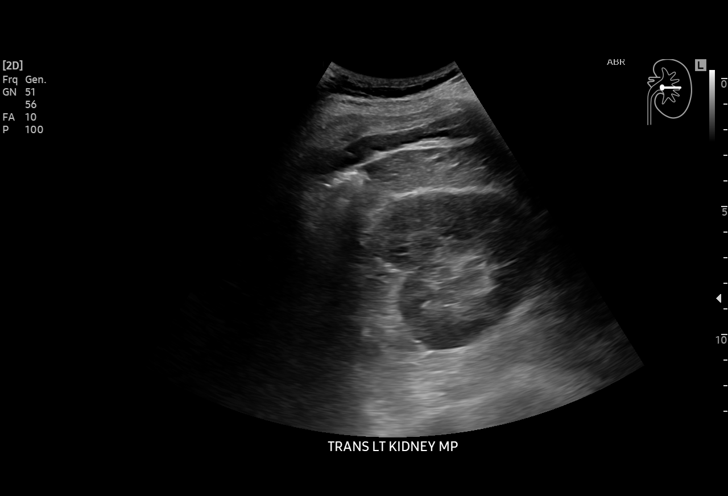
[im 92/92]
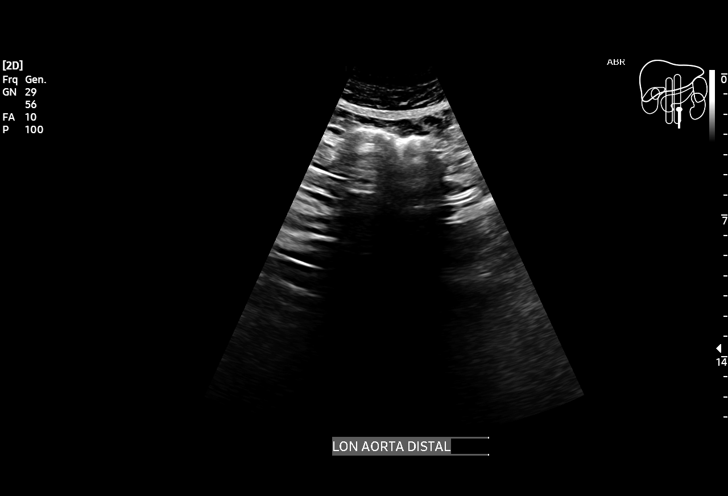

[15 of 25 positions shown; findings below may reference images not displayed]

FINDINGS: Gallbladder: Physiologically distended. No gallstones or wall
thickening visualized. No sonographic Murphy sign noted by
sonographer.

Common bile duct: Diameter: 4 mm, normal.

Liver: 1.3 cm cyst in the left lobe of the liver. Background diffuse
increase in parenchymal echogenicity. Portal vein is patent on color
Doppler imaging with normal direction of blood flow towards the
liver.

IVC: No abnormality visualized.

Pancreas: Visualized portion unremarkable.

Spleen: Size and appearance within normal limits.

Right Kidney: Length: 11.3 cm. Mild hydronephrosis and dilatation of
the renal pelvis. There is a 1.2 cm shadowing calculus in the upper
right ureter just beyond the ureteropelvic junction. Parenchymal
echogenicity is normal.

Left Kidney: Length: 13.2 cm. Echogenicity within normal limits. No
mass or hydronephrosis visualized. No visualized renal calculi.

Abdominal aorta: Distal abdominal aorta is not well visualized due
to overlying bowel gas. Patient had recent aortic stent placement.
Aneurysm sac is obscured on the current exam. The proximal aorta is
normal in caliber.

Other findings: No right upper quadrant ascites.
IMPRESSION: 1. Mild right hydronephrosis with 1.2 cm stone in the upper right
ureter.
2. Normal sonographic appearance of the gallbladder.  No gallstones.
3. Hepatic steatosis.
4. Distal aorta is obscured by overlying bowel gas, obscuring prior
aortic aneurysm. Patient is post recent aortic stent graft, not well
assessed on the current exam.

## 2022-03-10 IMAGING — CR DG ABDOMEN 1V
1 series · 3 of 3 positions shown · non-contrast
Comparison: Abdominal x-ray dated October 28, 2019.

CLINICAL DATA: Right kidney stone.  Recent lithotripsy.

EXAM:
ABDOMEN - 1 VIEW

[Series 1: dg abd 1 view · 0.14mm/px · 3 of 3 slices shown]
[im 1/3]
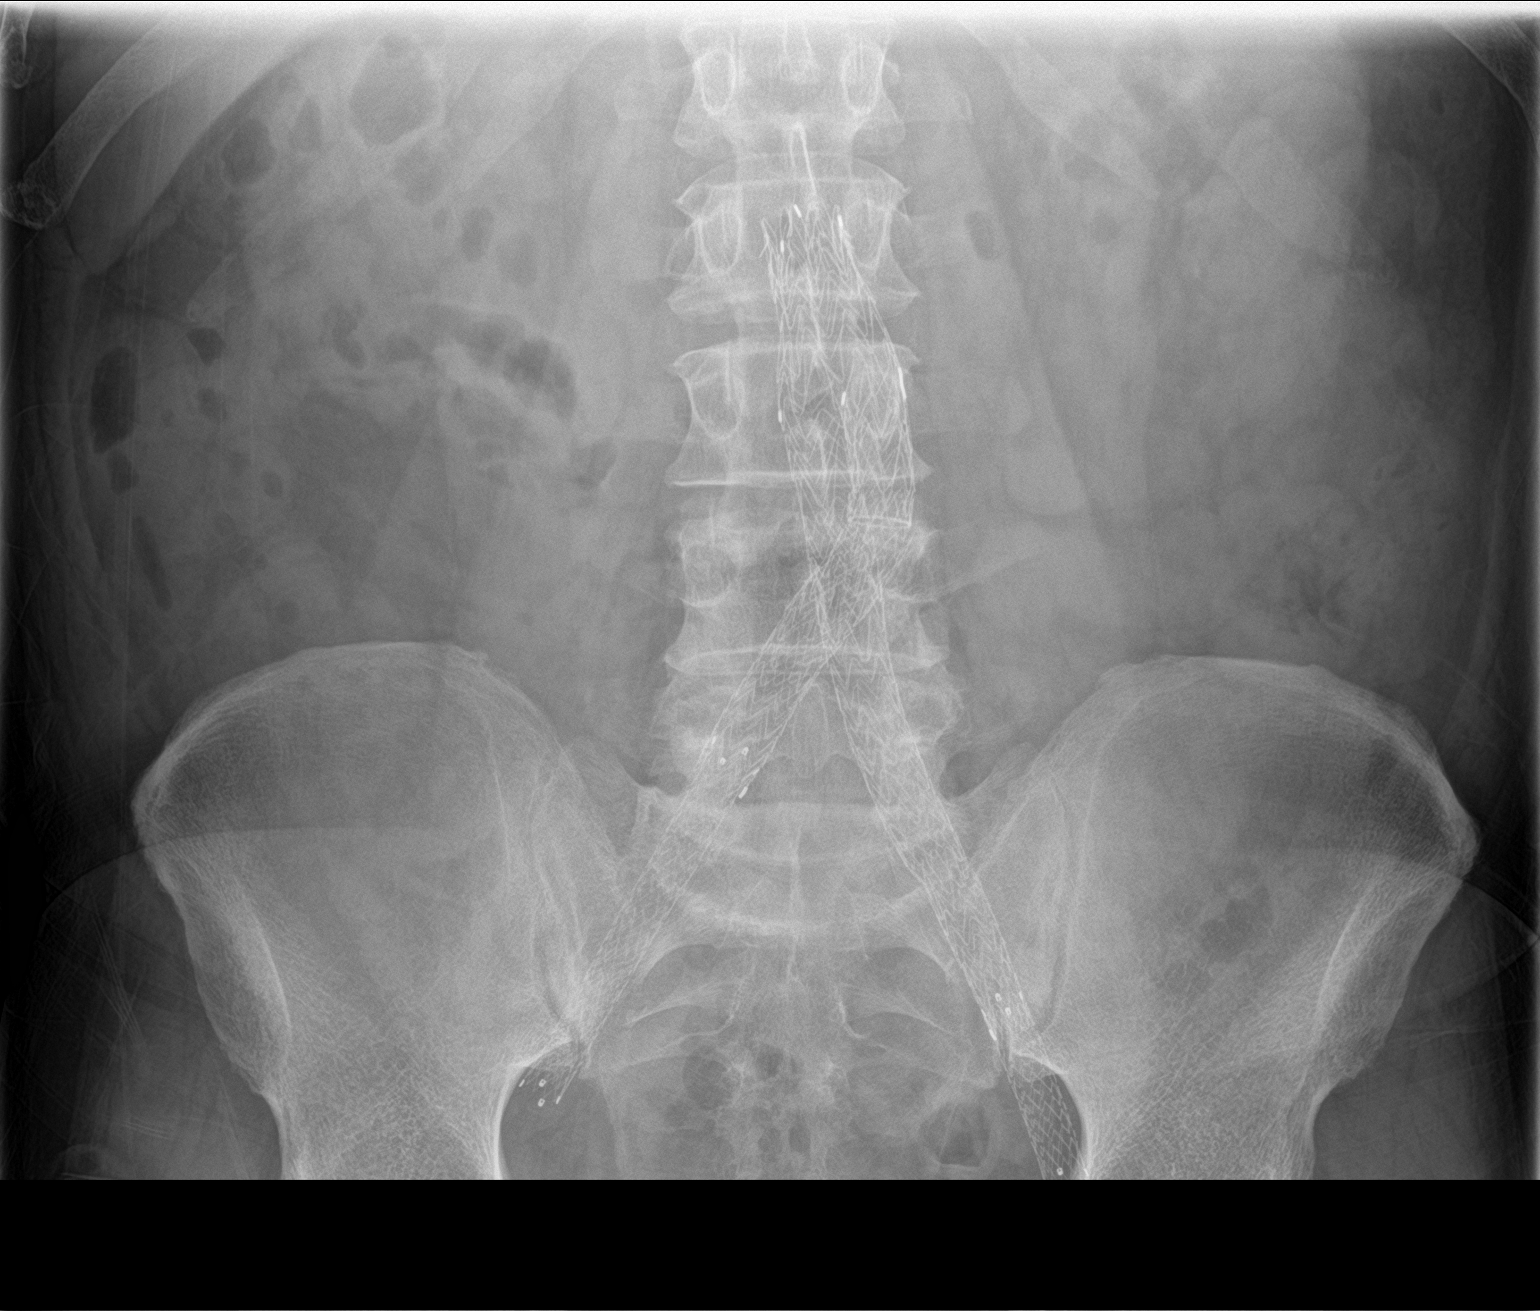
[im 2/3]
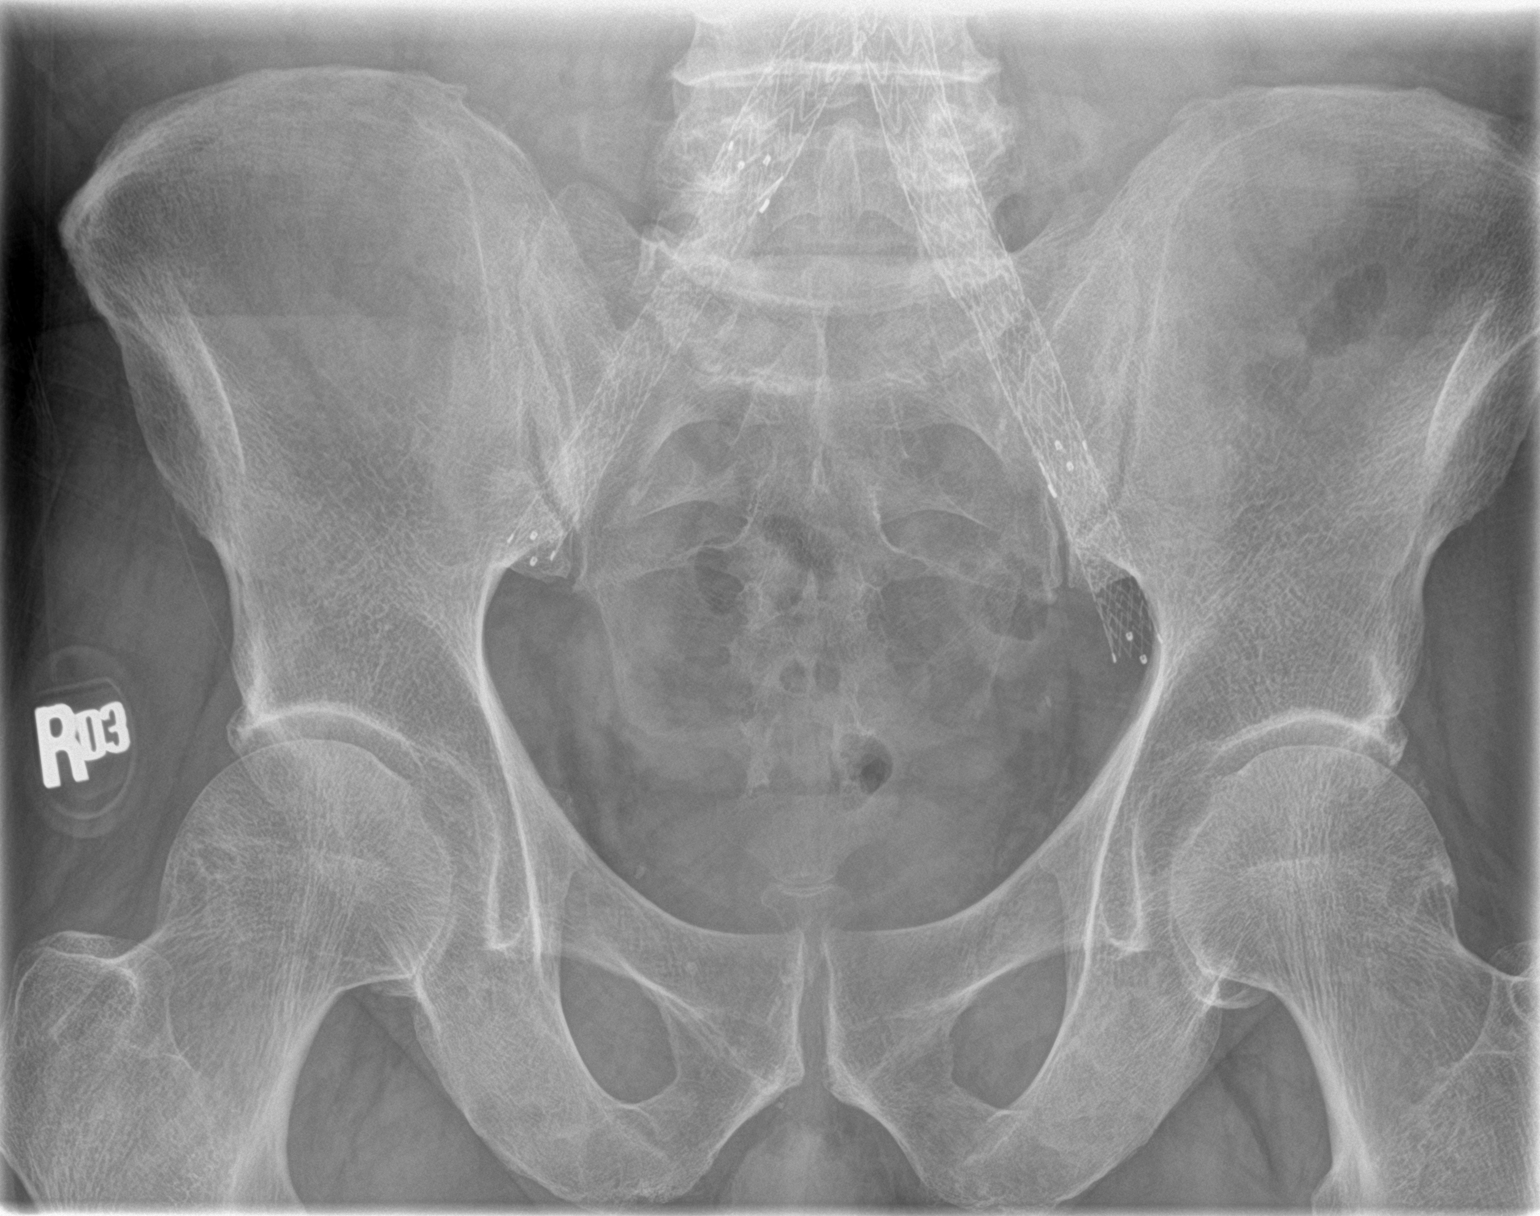
[im 3/3]
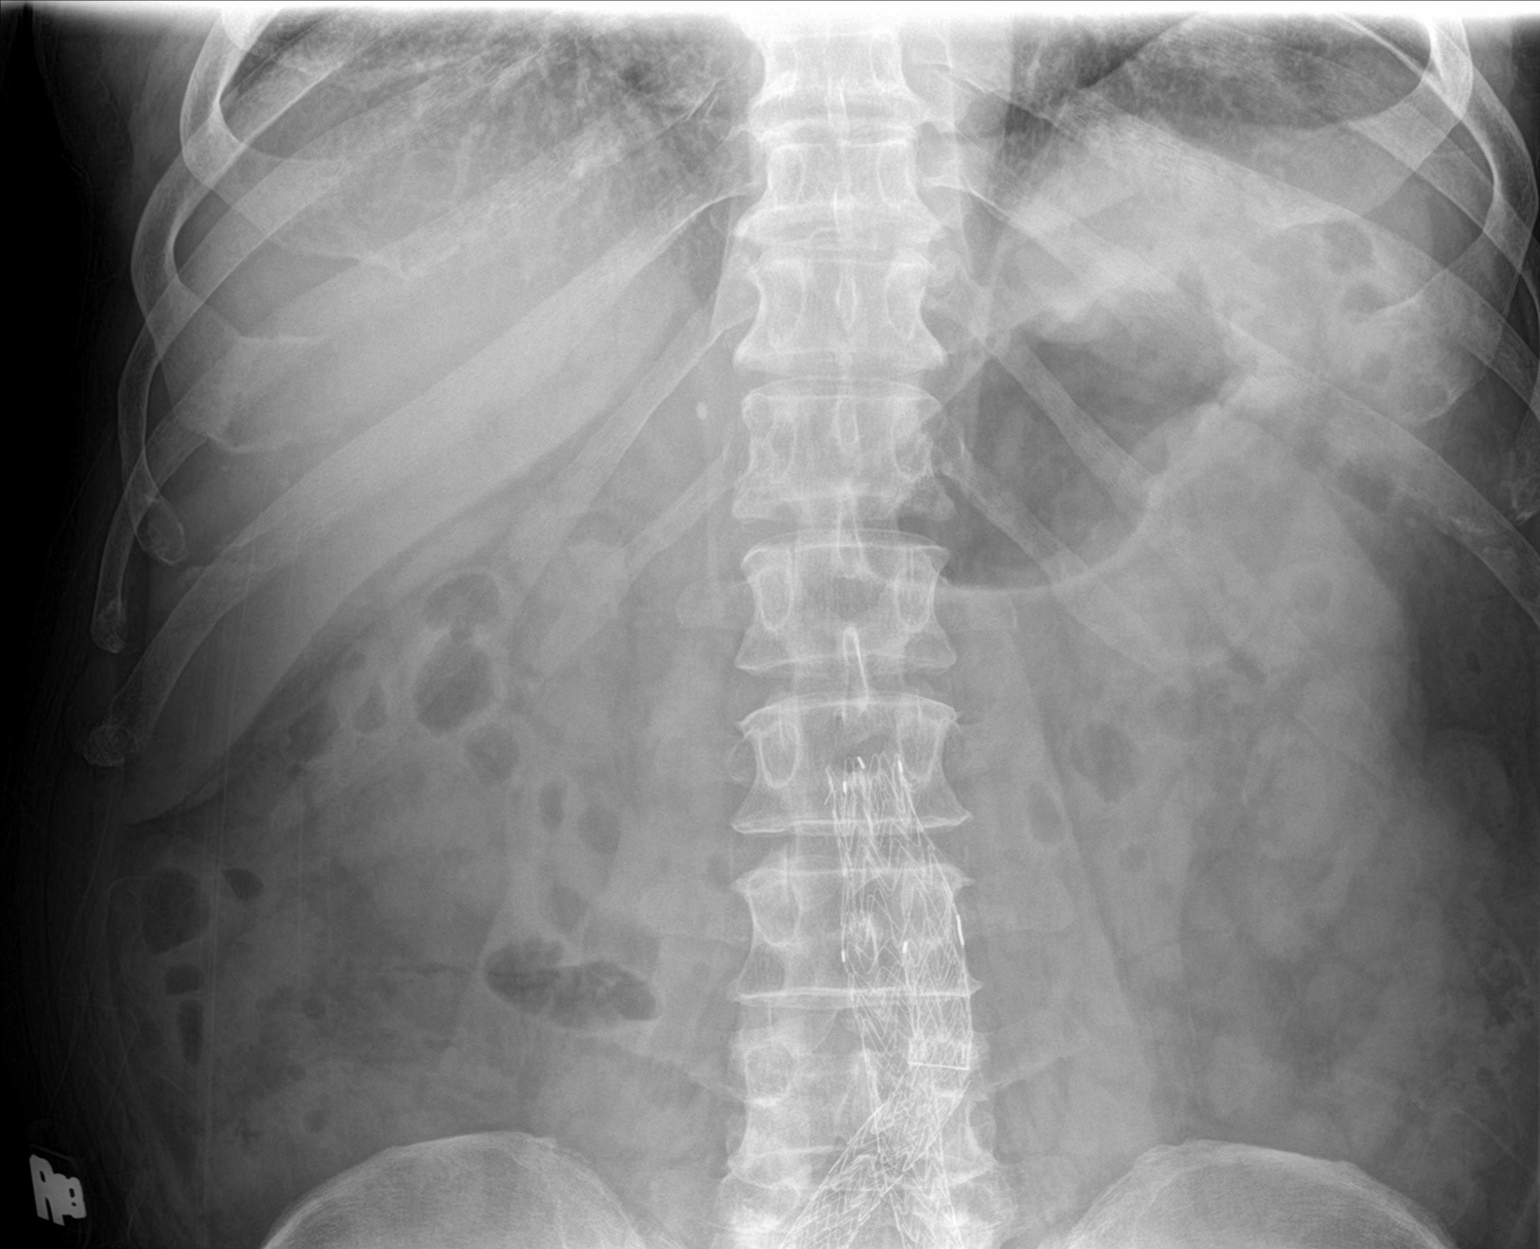

[3 of 3 positions shown; findings below may reference images not displayed]

FINDINGS: The bowel gas pattern is normal. No radio-opaque calculi or other
significant radiographic abnormality are seen. Aortoiliac stent
graft again noted. No acute osseous abnormality.
IMPRESSION: 1. Right ureteral calculus no longer visualized.

## 2022-03-15 DIAGNOSIS — J069 Acute upper respiratory infection, unspecified: Secondary | ICD-10-CM | POA: Diagnosis not present

## 2022-04-26 ENCOUNTER — Other Ambulatory Visit: Payer: Self-pay

## 2022-04-26 ENCOUNTER — Telehealth: Payer: Self-pay | Admitting: Cardiology

## 2022-04-26 MED ORDER — LOSARTAN POTASSIUM 50 MG PO TABS: 50.0000 mg | ORAL_TABLET | Freq: Every day | ORAL | 3 refills | Status: DC

## 2022-04-26 NOTE — Telephone Encounter (Signed)
Disp Refills Start End   losartan (COZAAR) 50 MG tablet 90 tablet 3 04/26/2022 04/21/2023   Sig - Route: Take 1 tablet (50 mg total) by mouth daily. - Oral   Sent to pharmacy as: losartan (COZAAR) 50 MG tablet   E-Prescribing Status: Receipt confirmed by pharmacy (04/26/2022  3:50 PM EST)    Pharmacy  [id number]- [place], [place]

## 2022-04-26 NOTE — Telephone Encounter (Signed)
*  STAT* If patient is at the pharmacy, call can be transferred to refill team.   1. Which medications need to be refilled? (please list name of each medication and dose if known)  losartan (COZAAR) 50 MG tablet  2. Which pharmacy/location (including street and city if local pharmacy) is medication to be sent to? [id number] [place], [place] - [address]  3. Do they need a 30 day or 90 day supply?  30 day supply

## 2022-05-18 ENCOUNTER — Encounter: Payer: Self-pay | Admitting: Cardiology

## 2022-05-18 ENCOUNTER — Ambulatory Visit: Payer: BC Managed Care – PPO | Attending: Cardiology | Admitting: Cardiology

## 2022-05-18 VITALS — BP 148/92 | HR 92 | Ht 72.0 in | Wt 201.6 lb

## 2022-05-18 DIAGNOSIS — E785 Hyperlipidemia, unspecified: Secondary | ICD-10-CM | POA: Diagnosis not present

## 2022-05-18 DIAGNOSIS — I10 Essential (primary) hypertension: Secondary | ICD-10-CM

## 2022-05-18 DIAGNOSIS — I2510 Atherosclerotic heart disease of native coronary artery without angina pectoris: Secondary | ICD-10-CM | POA: Diagnosis not present

## 2022-05-18 MED ORDER — REPATHA SURECLICK 140 MG/ML ~~LOC~~ SOAJ: 140.0000 mg | SUBCUTANEOUS | 3 refills | Status: DC

## 2022-05-18 MED ORDER — LOSARTAN POTASSIUM 50 MG PO TABS: 50.0000 mg | ORAL_TABLET | Freq: Two times a day (BID) | ORAL | 3 refills | Status: DC

## 2022-05-18 MED ORDER — ASPIRIN 81 MG PO TBEC: 81.0000 mg | DELAYED_RELEASE_TABLET | Freq: Every day | ORAL | 3 refills | Status: DC

## 2022-05-18 NOTE — Progress Notes (Signed)
Cardiology Office Note:    Date:  05/18/2022   ID:  Antonio Russell, DOB 05-01-1960, MRN [id number]  PCP:  Abner Greenspan, MD  Cardiologist:  Kate Sable, MD  Electrophysiologist:  None   Referring MD: Abner Greenspan, MD   Chief Complaint  Patient presents with   Follow-up    6 month f/u, no new cardiac concerns     History of Present Illness:    Antonio Russell is a 62 y.o. male with a hx of CAD/PCI to [facility] 05/2020 (LAD 50%, mLCx 60-70%), hyperlipidemia, hypertension, former smoker x40+ years, AAA s/p endoprosthetic repair 08/2019,  peripheral arterial disease s/p R and L iliac stents, sleep apnea who presents for follow-up.   Denies chest pain or shortness of breath.  Compliant with medications as prescribed.  Blood pressure at home runs around 0000000 systolic.  Currently on Repatha due to statin intolerance.   Prior notes Echocardiogram 05/2020 showed normal systolic and diastolic function, EF 55 to 60%  Coronary CTA 05/2020 showed calcium score 458, severe stenosis in the proximal left circumflex, moderate stenosis in the proximal RCA, CT FFR showed significant mid left circumflex stenosis.   Left heart cath 06/14/2020 showed 50% mid LAD stenosis, 60 to 70% proximal/mid left circumflex disease, 95% mid RCA lesion.  Underwent PCI to mid RCA using drug-eluting stent.   Past Medical History:  Diagnosis Date   Allergic rhinitis    Allergy    Anxiety    Arthritis    Back pain    CAD (coronary artery disease)    a. 2012 Cath: nonobstructive, nl LV fxn, rec aggressive med management; b. 05/2020 Cath/PCI: LM nl, LAD 24m RI mod diff dzs, LCX 65p/m, OM3 20, RCA 30p, 931m2.5x12 Resolute Onyx DES).   GERD (gastroesophageal reflux disease)    Headache(784.0)    History of kidney stones    HLD (hyperlipidemia)    Hyperparathyroidism (HCC)    Hypertension    Impotence of organic origin    Other testicular hypofunction    Peripheral vascular disease (HCC)    Personal history of  urinary calculi    Sleep apnea    no c-pap or bi-pap   Thyroid disease    Hyperparathyroidism   Tobacco use disorder    Unspecified hearing loss     Past Surgical History:  Procedure Laterality Date   CARDIAC CATHETERIZATION     COLONOSCOPY     CORONARY STENT INTERVENTION N/A 06/14/2020   Procedure: CORONARY STENT INTERVENTION;  Surgeon: EnNelva BushMD;  [facility];  Service: Cardiovascular;  Laterality: N/A;   ENDOVASCULAR REPAIR/STENT GRAFT N/A 08/12/2019   Procedure: ENDOVASCULAR REPAIR/STENT GRAFT;  Surgeon: DeAlgernon HuxleyMD;  [facility];  Service: Cardiovascular;  Laterality: N/A;   EXTRACORPOREAL SHOCK WAVE LITHOTRIPSY Right 10/29/2019   Procedure: EXTRACORPOREAL SHOCK WAVE LITHOTRIPSY (ESWL);  Surgeon: SnBilley CoMD;  Location: [facility];  Service: Urology;  Laterality: Right;   HEMORRHOID SURGERY     HEMORRHOID SURGERY     KNEE SURGERY Right 2013   LEFT HEART CATH AND CORONARY ANGIOGRAPHY Left 06/14/2020   Procedure: LEFT HEART CATH AND CORONARY ANGIOGRAPHY;  Surgeon: EnNelva BushMD;  [facility];  Service: Cardiovascular;  Laterality: Left;   NASAL SEPTUM SURGERY     PARATHYROID EXPLORATION N/A 02/12/2020   Procedure: PARATHYROID EXPLORATION WITH PARATHROIDECTOMY;  Surgeon: GeArmandina GemmaMD;  Location: [facility];  Service: General;  Laterality: N/A;  Current Medications: Current Meds  Medication Sig   aspirin EC 81 MG tablet Take 1 tablet (81 mg total) by mouth daily. Swallow whole.   fexofenadine (ALLEGRA) 180 MG tablet Take 180 mg by mouth daily.    fluticasone (FLONASE) 50 MCG/ACT nasal spray SPRAY 2 SPRAYS INTO EACH NOSTRIL EVERY DAY   tadalafil (CIALIS) 5 MG tablet Take 1 tablet (5 mg total) by mouth daily.   [DISCONTINUED] losartan (COZAAR) 50 MG tablet Take 1 tablet (50 mg total) by mouth daily.   [DISCONTINUED] REPATHA SURECLICK [id number] MG/ML SOAJ Inject 140 mg into the muscle every 14 (fourteen)  days.     Allergies:   Sertraline hcl, Amlodipine, Crestor [rosuvastatin calcium], Lipitor [atorvastatin calcium], and Zocor [simvastatin - high dose]   Social History   Socioeconomic History   Marital status: Married    Spouse name: Not on file   Number of children: Not on file   Years of education: Not on file   Highest education level: Not on file  Occupational History   Not on file  Tobacco Use   Smoking status: Former    Packs/day: 0.25    Years: 35.00    Total pack years: 8.75    Types: Cigarettes    Quit date: 10/08/2019    Years since quitting: 2.6   Smokeless tobacco: Former    Types: Chew, Snuff   Tobacco comments:    couple of months - 71 yrs ago  Vaping Use   Vaping Use: Former  Substance and Sexual Activity   Alcohol use: Yes    Alcohol/week: 4.0 - 5.0 standard drinks of alcohol    Types: 4 - 5 Cans of beer per week    Comment: daily    Drug use: No   Sexual activity: Yes    Birth control/protection: None  Other Topics Concern   Not on file  Social History Narrative   Coaches basketball   Social Determinants of Health   Financial Resource Strain: Not on file  Food Insecurity: Not on file  Transportation Needs: Not on file  Physical Activity: Not on file  Stress: Not on file  Social Connections: Not on file     Family History: The patient's family history includes Cancer in his father; Colon cancer in an other family member; Diabetes in his mother; Esophageal cancer in his paternal uncle; Heart disease in an other family member; Hyperlipidemia in his father and mother; Hypertension in his father, mother, and sister; Lung cancer in an other family member. There is no history of Stomach cancer or Rectal cancer.  ROS:   Please see the history of present illness.     All other systems reviewed and are negative.  EKGs/Labs/Other Studies Reviewed:    The following studies were reviewed today:   EKG:  EKG is  ordered today.  The ekg ordered today  demonstrates sinus rhythm, normal ECG.  Recent Labs: 05/19/2021: TSH 1.43  Recent Lipid Panel    Component Value Date/Time   CHOL 206 (H) 08/18/2020 0801   TRIG 149 08/18/2020 0801   HDL 61 08/18/2020 0801   CHOLHDL 3.4 08/18/2020 0801   VLDL 30 08/18/2020 0801   LDLCALC 115 (H) 08/18/2020 0801   LDLDIRECT 225.0 05/01/2019 0938    Physical Exam:    VS:  BP (!) 148/92 (BP Location: Left Arm)   Pulse 92   Ht 6' (1.829 m)   Wt 201 lb 9 oz (91.4 kg)   SpO2 98%   BMI  27.34 kg/m     Wt Readings from Last 3 Encounters:  05/18/22 201 lb 9 oz (91.4 kg)  05/30/21 191 lb (86.6 kg)  05/22/21 191 lb (86.6 kg)     GEN:  Well nourished, well developed in no acute distress HEENT: Normal NECK: No JVD; No carotid bruits CARDIAC: RRR, no murmurs, rubs, gallops RESPIRATORY:  Clear to auscultation without rales, wheezing or rhonchi  ABDOMEN: Soft, non-tender, non-distended MUSCULOSKELETAL:  No edema; No deformity  SKIN: Warm and dry NEUROLOGIC:  Alert and oriented x 3 PSYCHIATRIC:  Normal affect   ASSESSMENT:    1. Coronary artery disease involving native coronary artery of native heart without angina pectoris   2. Hyperlipidemia LDL goal <70   3. Essential hypertension    PLAN:     CAD/PCI to mid RCA 05/2020.  Denies chest pain.  Continue aspirin, Repatha.  Obtain fasting lipid profile.. hyperlipidemia.  Continue Repatha, obtain fasting lipid profile. History of hypertension.  BP elevated, increase losartan to 50 mg twice daily.  Follow-up in 3 months  Medication Adjustments/Labs and Tests Ordered: Current medicines are reviewed at length with the patient today.  Concerns regarding medicines are outlined above.  Orders Placed This Encounter  Procedures   Lipid panel   EKG 12-Lead   Meds ordered this encounter  Medications   aspirin EC 81 MG tablet    Sig: Take 1 tablet (81 mg total) by mouth daily. Swallow whole.    Dispense:  90 tablet    Refill:  3   losartan  (COZAAR) 50 MG tablet    Sig: Take 1 tablet (50 mg total) by mouth in the morning and at bedtime.    Dispense:  90 tablet    Refill:  3   REPATHA SURECLICK [id number] MG/ML SOAJ    Sig: Inject 140 mg into the muscle every 14 (fourteen) days.    Dispense:  2 mL    Refill:  3    Patient Instructions  Medication Instructions:   INCREASE Losartan - take one tablet (50 mg) by mouth twice a day.   *If you need a refill on your cardiac medications before your next appointment, please call your pharmacy*   Lab Work:  Your physician recommends that you return for lab work in: [facility]. You will need to be fasting.  No appt is needed. Hours are M-F 7AM- 6 PM.  If you have labs (blood work) drawn today and your tests are completely normal, you will receive your results only by: [place] (if you have MyChart) OR A paper copy in the mail If you have any lab test that is abnormal or we need to change your treatment, we will call you to review the results.   Testing/Procedures:  None Ordered   Follow-Up: At [facility], you and your health needs are our priority.  As part of our continuing mission to provide you with exceptional heart care, we have created designated Provider Care Teams.  These Care Teams include your primary Cardiologist (physician) and Advanced Practice Providers (APPs -  Physician Assistants and Nurse Practitioners) who all work together to provide you with the care you need, when you need it.  We recommend signing up for the patient portal called "MyChart".  Sign up information is provided on this After Visit Summary.  MyChart is used to connect with patients for Virtual Visits (Telemedicine).  Patients are able to view lab/test results, encounter notes, upcoming appointments, etc.  Non-urgent messages can be sent to your provider as well.   To learn more about what you can do with MyChart, go to [email or web address].    Your next  appointment:   3 month(s)  Provider:   You may see Kate Sable, MD or one of the following Advanced Practice Providers on your designated Care Team:   Murray Hodgkins, NP Christell Faith, PA-C Cadence Kathlen Mody, PA-C Gerrie Nordmann, NP   Signed, Kate Sable, MD  05/18/2022 9:34 AM    [place]

## 2022-05-18 NOTE — Patient Instructions (Signed)
Medication Instructions:   INCREASE Losartan - take one tablet (50 mg) by mouth twice a day.   *If you need a refill on your cardiac medications before your next appointment, please call your pharmacy*   Lab Work:  Your physician recommends that you return for lab work in: [facility]. You will need to be fasting.  No appt is needed. Hours are M-F 7AM- 6 PM.  If you have labs (blood work) drawn today and your tests are completely normal, you will receive your results only by: [place] (if you have MyChart) OR A paper copy in the mail If you have any lab test that is abnormal or we need to change your treatment, we will call you to review the results.   Testing/Procedures:  None Ordered   Follow-Up: At [facility], you and your health needs are our priority.  As part of our continuing mission to provide you with exceptional heart care, we have created designated Provider Care Teams.  These Care Teams include your primary Cardiologist (physician) and Advanced Practice Providers (APPs -  Physician Assistants and Nurse Practitioners) who all work together to provide you with the care you need, when you need it.  We recommend signing up for the patient portal called "MyChart".  Sign up information is provided on this After Visit Summary.  MyChart is used to connect with patients for Virtual Visits (Telemedicine).  Patients are able to view lab/test results, encounter notes, upcoming appointments, etc.  Non-urgent messages can be sent to your provider as well.   To learn more about what you can do with MyChart, go to [email or web address].    Your next appointment:   3 month(s)  Provider:   You may see Kate Sable, MD or one of the following Advanced Practice Providers on your designated Care Team:   Murray Hodgkins, NP Christell Faith, PA-C Cadence Kathlen Mody, PA-C Gerrie Nordmann, NP

## 2022-05-23 ENCOUNTER — Telehealth: Payer: Self-pay

## 2022-05-23 NOTE — Telephone Encounter (Signed)
PA for repatha is approved.

## 2022-05-23 NOTE — Telephone Encounter (Signed)
PA started through [employer] (Key: [id number]) Rx #: [id number] Repatha SureClick [id number] auto-injectors

## 2022-05-24 ENCOUNTER — Other Ambulatory Visit: Payer: BC Managed Care – PPO

## 2022-05-24 ENCOUNTER — Other Ambulatory Visit
Admission: RE | Admit: 2022-05-24 | Discharge: 2022-05-24 | Disposition: A | Discharge status: Home / Self Care | Payer: BC Managed Care – PPO | Attending: Cardiology | Admitting: Cardiology

## 2022-05-24 DIAGNOSIS — E785 Hyperlipidemia, unspecified: Secondary | ICD-10-CM | POA: Diagnosis not present

## 2022-05-24 DIAGNOSIS — N138 Other obstructive and reflux uropathy: Secondary | ICD-10-CM

## 2022-05-24 DIAGNOSIS — N401 Enlarged prostate with lower urinary tract symptoms: Secondary | ICD-10-CM | POA: Diagnosis not present

## 2022-05-24 LAB — LIPID PANEL
Cholesterol: 164 mg/dL (ref 0–200)
HDL: 64 mg/dL (ref >40)
LDL Cholesterol: 72 mg/dL (ref 0–99)
Total CHOL/HDL Ratio: 2.6 RATIO
Triglycerides: 139 mg/dL (ref <150)
VLDL: 28 mg/dL (ref 0–40)

## 2022-05-25 ENCOUNTER — Encounter: Payer: Self-pay | Admitting: Internal Medicine

## 2022-05-25 ENCOUNTER — Ambulatory Visit (INDEPENDENT_AMBULATORY_CARE_PROVIDER_SITE_OTHER): Payer: BC Managed Care – PPO | Admitting: Internal Medicine

## 2022-05-25 VITALS — BP 130/80 | HR 85 | Ht 72.0 in | Wt 201.4 lb

## 2022-05-25 DIAGNOSIS — E559 Vitamin D deficiency, unspecified: Secondary | ICD-10-CM | POA: Diagnosis not present

## 2022-05-25 DIAGNOSIS — R7303 Prediabetes: Secondary | ICD-10-CM

## 2022-05-25 DIAGNOSIS — E210 Primary hyperparathyroidism: Secondary | ICD-10-CM

## 2022-05-25 DIAGNOSIS — E042 Nontoxic multinodular goiter: Secondary | ICD-10-CM | POA: Diagnosis not present

## 2022-05-25 LAB — POCT GLYCOSYLATED HEMOGLOBIN (HGB A1C): Hemoglobin A1C: 5.8 % — AB (ref 4.0–5.6)

## 2022-05-25 LAB — TSH: TSH: 1.33 u[IU]/mL (ref 0.35–5.50)

## 2022-05-25 LAB — CALCIUM: Calcium: 9.8 mg/dL (ref 8.4–10.5)

## 2022-05-25 LAB — PSA: Prostate Specific Ag, Serum: 0.7 ng/mL (ref 0.0–4.0)

## 2022-05-25 LAB — VITAMIN D 25 HYDROXY (VIT D DEFICIENCY, FRACTURES): VITD: 37.28 ng/mL (ref 30.00–100.00)

## 2022-05-25 NOTE — Patient Instructions (Signed)
Please stop at the lab.  We will need a new thyroid ultrasound.  Please return for another visit in 1 year.

## 2022-05-25 NOTE — Progress Notes (Signed)
Patient ID: Antonio Russell, male   DOB: 1960-06-03, 62 y.o.   MRN: [id number]   HPI  Antonio Russell is a 62 y.o.-year-old male, initially referred by his PCP, Dr. Glori Bickers, returning for follow-up for hypercalcemia/hyperparathyroidism and vitamin D deficiency.  Last visit 1 year ago.  Interim history: He feels well and has no complaints today. After his parathyroid surgery in 02/2020, he started to feel better and had no more kidney stones.  Reviewed and addended history: She has had hypercalcemia since at least 08/2019.  Reviewed pertinent labs: Lab Results  Component Value Date   PTH 35 05/19/2021   PTH Comment 05/19/2021   PTH 39 05/16/2020   PTH Comment 05/16/2020   PTH 60 06/12/2019   PTH 133 (H) 05/01/2019   PTH 137 (H) 01/13/2019   CALCIUM 9.3 05/19/2021   CALCIUM 9.2 08/18/2020   CALCIUM 9.0 06/15/2020   CALCIUM 9.0 06/13/2020   CALCIUM 9.5 05/16/2020   CALCIUM 9.4 05/16/2020   CALCIUM 9.5 02/13/2020   CALCIUM 10.7 (H) 02/09/2020   CALCIUM 10.3 10/26/2019   CALCIUM 10.5 (H) 09/15/2019  Postop calcium: 9.5, PTH 28  No history of osteoporosis/fracture/falls.  Phosphorus was low:  Lab Results  Component Value Date   PHOS 2.1 (L) 06/12/2019   PHOS 3.0 05/01/2019    Further investigation for hyperparathyroidism: Component     Latest Ref Rng & Units 06/12/2019          Vitamin D 1, 25 (OH) Total     18 - 72 pg/mL 120 (H)  Vitamin D3 1, 25 (OH)     pg/mL 68  Vitamin D2 1, 25 (OH)     pg/mL 52  Magnesium     1.5 - 2.5 mg/dL 2.2  Creatinine, 24H Ur     0.50 - 2.15 g/24 h 1.56  Calcium, 24H Urine     mg/24 h 401 (H)  FECa = 0.02%  After the above labs returned, I referred him to surgery.  He had the following investigations: Technetium sestamibi scan (10/14/2019): Right lower pole possible parathyroid adenoma  Thyroid ultrasound (10/14/2019): Small thyroid nodules: Parenchymal Echotexture: Normal Isthmus: Normal in size measuring 0.4 cm in diameter Right lobe:  Normal in size measuring 4.8 x 2.0 x 1.6 cm Left lobe: Normal in size measuring 4.3 x 1.9 x 1.6 cm _________________________________________________________   Nodule # 1: Location: Right; Mid Maximum size: 1.2 cm; Other 2 dimensions: 1.2 x 0.8 cm Composition: solid/almost completely solid (2) Echogenicity: hypoechoic (2)  *Given size (>/= 1 - 1.4 cm) and appearance, a follow-up ultrasound in 1 year should be considered based on TI-RADS criteria. _________________________________________________________   Nodule # 2: Location: Right; Inferior  Maximum size: 1.0 cm; Other 2 dimensions: 0.8 x 0.8 cm Composition: solid/almost completely solid (2) Echogenicity: hypoechoic (2)  *Given size (>/= 1 - 1.4 cm) and appearance, a follow-up ultrasound in 1 year should be considered based on TI-RADS criteria. _________________________________________________________   There are 2 punctate, approximately 0.8 cm) hypoechoic nodules within the mid (labeled 3) and inferior (labeled 4) aspects the left lobe of the thyroid, neither of which meet criteria to recommend percutaneous sampling or continued dedicated follow-up.   No definitive extra thyroidal nodules are identified to suggest the presence of a parathyroid adenoma.   IMPRESSION: 1. Findings suggestive of multinodular goiter. 2. Nodules #1 and #2 both meet imaging criteria to recommend a 1 year follow-up as indicated. 3. No definitive extra thyroidal nodules to suggest the presence of  a parathyroid adenoma.  4D CT (11/06/2019): Carotid stenosis but no convincing parathyroid lesion.   Since then, he saw vascular surgery on 12/25/2019 >> mild >> he was cleared for surgery.  She did not have the surgery yet.  She sees vascular again next months.  He had right inferior parathyroid exploratory parathyroidectomy with Dr. Harlow Asa on 02/12/2020. Pathology showed a 1.1 cm, 0.464 g hypercellular mass, consistent with adenoma.  Thyroid U/S  (06/16/2021): Parenchymal Echotexture: Normal  Isthmus: 0.3 cm  Right lobe: 5.4 x 1.6 x 1.6 cm  Left lobe: 4.6 x 1.8 x 1.7 cm  _________________________________________________________   Estimated total number of nodules >/= 1 cm: 5 _________________________________________________________   Nodule # 1:  Location: RIGHT; Superior  Maximum size: 1.4 cm; Other 2 dimensions: 1.4 x 0.8 cm  Composition: solid/almost completely solid (2)  Echogenicity: hypoechoic (2)  *Given size (>/= 1 - 1.4 cm) and appearance, a follow-up ultrasound in 1 year should be considered based on TI-RADS criteria.  _________________________________________________________   Nodule # 2:  Location: RIGHT; Inferior  Maximum size: 1.0 cm; Other 2 dimensions: 1.0 x 0.8 cm  Composition: solid/almost completely solid (2)  Echogenicity: hypoechoic (2)  Shape: taller-than-wide (3)  ACR TI-RADS total points: 7.  **Given size (>/= 1.0 cm) and appearance, fine needle aspiration of this highly suspicious nodule should be considered based on TI-RADS criteria.  ________________________________________________________   Additional sub-1 cm #3 TR-3, #4 TR-4 and #5 TR-4 do not meet threshold for follow-up nor biopsy.   No cervical adenopathy or abnormal fluid collection within the imaged neck.   IMPRESSION: 1. Multinodular thyroid gland. 2. 1.0 cm RIGHT inferior TR-5 thyroid nodule. Fine needle aspiration of this highly suspicious nodule should be considered based on TI-RADS criteria. 3. 1.4 cm RIGHT superior TR-4 thyroid nodule. A follow-up ultrasound in 1 year should be considered based on TI-RADS criteria.      Calcium decreased appropriately postop.  Latest BUN/Cr: Lab Results  Component Value Date   BUN 18 08/18/2020   BUN 21 (H) 06/15/2020   CREATININE 1.09 08/18/2020   CREATININE 1.00 06/15/2020   He has a history of vitamin D deficiency: Lab Results  Component Value Date   VD25OH 33.22  05/19/2021   VD25OH 62.37 05/16/2020   VD25OH 29.1 (L) 01/05/2020   VD25OH 27.43 (L) 06/05/2019   VD25OH 19.52 (L) 05/01/2019   VD25OH 15.29 (L) 01/13/2019   He was initially on vitamin D 5000 units daily, then only on a MVI.  Since last visit he restarted the 5000 units vitamin D daily, also.  No family history of hypercalcemia or pituitary tumors.  He has a half sister with thyroid cancer.  Latest TSH was normal: Lab Results  Component Value Date   TSH 1.43 05/19/2021   Pt. also has a history of L posterior thigh muscles pain and weakness 4 years ago >> now bilateral >> also anterior aspects of thighs. He also has a h/o chest pain - s/p catheterization, HTN, HL - on Praluent, high WBC. Metoprolol was changed to Losartan 2/2 ED. He has a history of prediabetes: Lab Results  Component Value Date   HGBA1C 5.8 (A) 05/25/2022   HGBA1C 6.1 05/01/2019   HGBA1C 6.3 01/13/2019   HGBA1C (H) 07/05/2010    5.7 (NOTE)  According to the ADA Clinical Practice Recommendations for 2011, when HbA1c is used as a screening test:   >=6.5%   Diagnostic of Diabetes Mellitus           (if abnormal result  is confirmed)  5.7-6.4%   Increased risk of developing Diabetes Mellitus  References:Diagnosis and Classification of Diabetes Mellitus,Diabetes Care,2011,34(Suppl 1):S62-S69 and Standards of Medical Care in         Diabetes - 2011,Diabetes [id number]  (Suppl 1):S11-S61.   ROS: + See HPI  I reviewed pt's medications, allergies, PMH, social hx, family hx, and changes were documented in the history of present illness. Otherwise, unchanged from my initial visit note.  Past Medical History:  Diagnosis Date   Allergic rhinitis    Allergy    Anxiety    Arthritis    Back pain    CAD (coronary artery disease)    a. 2012 Cath: nonobstructive, nl LV fxn, rec aggressive med management; b. 05/2020 Cath/PCI: LM nl, LAD 68m RI mod diff  dzs, LCX 65p/m, OM3 20, RCA 30p, 923m2.5x12 Resolute Onyx DES).   GERD (gastroesophageal reflux disease)    Headache(784.0)    History of kidney stones    HLD (hyperlipidemia)    Hyperparathyroidism (HCC)    Hypertension    Impotence of organic origin    Other testicular hypofunction    Peripheral vascular disease (HCC)    Personal history of urinary calculi    Sleep apnea    no c-pap or bi-pap   Thyroid disease    Hyperparathyroidism   Tobacco use disorder    Unspecified hearing loss    Past Surgical History:  Procedure Laterality Date   CARDIAC CATHETERIZATION     COLONOSCOPY     CORONARY STENT INTERVENTION N/A 06/14/2020   Procedure: CORONARY STENT INTERVENTION;  Surgeon: EnNelva BushMD;  [facility];  Service: Cardiovascular;  Laterality: N/A;   ENDOVASCULAR REPAIR/STENT GRAFT N/A 08/12/2019   Procedure: ENDOVASCULAR REPAIR/STENT GRAFT;  Surgeon: DeAlgernon HuxleyMD;  [facility];  Service: Cardiovascular;  Laterality: N/A;   EXTRACORPOREAL SHOCK WAVE LITHOTRIPSY Right 10/29/2019   Procedure: EXTRACORPOREAL SHOCK WAVE LITHOTRIPSY (ESWL);  Surgeon: SnBilley CoMD;  Location: [facility];  Service: Urology;  Laterality: Right;   HEMORRHOID SURGERY     HEMORRHOID SURGERY     KNEE SURGERY Right 2013   LEFT HEART CATH AND CORONARY ANGIOGRAPHY Left 06/14/2020   Procedure: LEFT HEART CATH AND CORONARY ANGIOGRAPHY;  Surgeon: EnNelva BushMD;  [facility];  Service: Cardiovascular;  Laterality: Left;   NASAL SEPTUM SURGERY     PARATHYROID EXPLORATION N/A 02/12/2020   Procedure: PARATHYROID EXPLORATION WITH PARATHROIDECTOMY;  Surgeon: GeArmandina GemmaMD;  Location: [facility];  Service: General;  Laterality: N/A;   Social History   Socioeconomic History   Marital status: Married    Spouse name: Not on file   Number of children: 3   Years of education: Not on file   Highest education level: Not on file  Occupational  History    MaGlass blower/designerTobacco Use   Smoking status: Current Every Day Smoker    Packs/day: 0.50    Years: 35.00    Pack years: 17.50    Types: Cigarettes   Smokeless tobacco: Former UsSystems developerSubstance and Sexual Activity   Alcohol use: Yes    Alcohol/week:  3-4 beers 5 days a week       Drug use:  No   Sexual activity: Not on file  Other Topics Concern   Not on file  Social History Narrative   Coaches basketball   Social Determinants of Health   Financial Resource Strain:    Difficulty of Paying Living Expenses: Not on file  Food Insecurity:    Worried About [place] in the Last Year: Not on file   Ran Out of Food in the Last Year: Not on file  Transportation Needs:    Lack of Transportation (Medical): Not on file   Lack of Transportation (Non-Medical): Not on file  Physical Activity:    Days of Exercise per Week: Not on file   Minutes of Exercise per Session: Not on file  Stress:    Feeling of Stress : Not on file  Social Connections:    Frequency of Communication with Friends and Family: Not on file   Frequency of Social Gatherings with Friends and Family: Not on file   Attends Religious Services: Not on file   Active Member of Clubs or Organizations: Not on file   Attends Archivist Meetings: Not on file   Marital Status: Not on file  Intimate Partner Violence:    Fear of Current or Ex-Partner: Not on file   Emotionally Abused: Not on file   Physically Abused: Not on file   Sexually Abused: Not on file   Current Outpatient Medications on File Prior to Visit  Medication Sig Dispense Refill   aspirin EC 81 MG tablet Take 1 tablet (81 mg total) by mouth daily. Swallow whole. 90 tablet 3   Cholecalciferol (DIALYVITE VITAMIN D 5000) 125 MCG (5000 UT) capsule Take 5,000 Units by mouth daily. (Patient not taking: Reported on 05/18/2022)     clopidogrel (PLAVIX) 75 MG tablet TAKE 1 TABLET (75 MG TOTAL) BY MOUTH DAILY AT 6 (SIX) AM. (Patient not  taking: Reported on 05/18/2022) 90 tablet 1   fexofenadine (ALLEGRA) 180 MG tablet Take 180 mg by mouth daily.      fluticasone (FLONASE) 50 MCG/ACT nasal spray SPRAY 2 SPRAYS INTO EACH NOSTRIL EVERY DAY 16 g 5   losartan (COZAAR) 50 MG tablet Take 1 tablet (50 mg total) by mouth in the morning and at bedtime. 90 tablet 3   REPATHA SURECLICK [id number] MG/ML SOAJ Inject 140 mg into the muscle every 14 (fourteen) days. 2 mL 3   tadalafil (CIALIS) 5 MG tablet Take 1 tablet (5 mg total) by mouth daily. 30 tablet 11   No current facility-administered medications on file prior to visit.   Allergies  Allergen Reactions   Sertraline Hcl Other (See Comments)    Tremors   Amlodipine Other (See Comments)    Sexual side effects   Crestor [Rosuvastatin Calcium] Other (See Comments)    Myalgia   Lipitor [Atorvastatin Calcium] Other (See Comments)    Myalgia   Zocor [Simvastatin - High Dose] Other (See Comments)    Myalgia    Family History  Problem Relation Age of Onset   Hypertension Mother    Hyperlipidemia Mother    Diabetes Mother    Hypertension Father    Hyperlipidemia Father    Cancer Father        throat   Hypertension Sister    Esophageal cancer Paternal Uncle    Lung cancer Other        GF   Colon cancer Other        dx "close to 11"   Heart disease Other  GM   Stomach cancer Neg Hx    Rectal cancer Neg Hx    PE: BP 130/80 (BP Location: Right Arm, Patient Position: Sitting, Cuff Size: Normal)   Pulse 85   Ht 6' (1.829 m)   Wt 201 lb 6.4 oz (91.4 kg)   SpO2 99%   BMI 27.31 kg/m  Wt Readings from Last 3 Encounters:  05/25/22 201 lb 6.4 oz (91.4 kg)  05/18/22 201 lb 9 oz (91.4 kg)  05/30/21 191 lb (86.6 kg)   Constitutional: overweight, in NAD Eyes:  EOMI, no exophthalmos ENT: no neck masses palpated, but neck fullness bilaterally, surgical scar healed, no cervical lymphadenopathy Cardiovascular: RRR, No MRG Respiratory: CTA B Musculoskeletal: no  deformities Skin: no rashes Neurological: no tremor with outstretched hands  Assessment: 1. Hypercalcemia/hyperparathyroidism  2.  Vitamin D deficiency  3.  Thyroid nodules  4. Prediabetes  Plan: Patient has had several instances of elevated calcium, with the highest level being at 11.9.  An intact PTH level was also high, at 137 for calcium of 11.1-11.5.  -He has a history of kidney stones and had to have ESWL.  No known history of osteoporosis or fractures. -Investigation for primary hyperparathyroidism was positive. He had a high calcium, high PTH, a high 24-hour urine calcium with a  fractional excretion of calcium higher than 0.01, low phosphorus, normal magnesium and high calcitriol. -I referred him to Dr. Harlow Asa with surgery.  He had a technetium sestamibi scan which showed a possible right lower pole adenoma.  Of note, a thyroid ultrasound and the 4D CT did not show an adenoma. -He had parathyroid exploratory surgery on 02/12/2020 and a right inferior parathyroid gland was resected.  Pathology showed a 1.1 cm, 0.464 g, hypercellular mass consistent with adenoma.  His calcium decreased from 10.7 preop to 9.5. PTH decreased from 133 to 28.  PTH and calcium levels remain normal afterwards. -No more kidney stones since then -Will repeat his calcium and vitamin D level today  2.  Vitamin D deficiency -He has a history of vitamin D deficiency with a level of 15 in 01/2019, when he was started on 50,000 units ergocalciferol weekly.  Then on vitamin D3 5000 units daily.  At last visit, however, he was only on a multivitamin, which he continues now.  He added back 5000 units vitamin D daily. -At last visit, we checked his vitamin D and this was normal -We will recheck a vitamin D level today  3.  Thyroid nodules -No neck compression symptoms  -At last visit, weight check the thyroid ultrasound (05/2021) and he was found with the right inferior nodule appeared highly suspicious-biopsy was  recommended.  I sent him a message about this to let me know if he agreed to have the biopsy, but he did not reply as he mentions that he was afraid of going through with this.  However, he is determined to do so if needed.  At today's visit, plan to repeat another ultrasound and then go ahead with biopsy if still needed. -latest TSH was reviewed and it was normal: Lab Results  Component Value Date   TSH 1.43 05/19/2021  -We will recheck the TSH today  4. Prediabetes - he has a h/o prediabetes Lab Results  Component Value Date   HGBA1C 5.8 (A) 05/25/2022   HGBA1C 6.1 05/01/2019   HGBA1C 6.3 01/13/2019   HGBA1C (H) 07/05/2010    5.7 (NOTE)  According to the ADA Clinical Practice Recommendations for 2011, when HbA1c is used as a screening test:   >=6.5%   Diagnostic of Diabetes Mellitus           (if abnormal result  is confirmed)  5.7-6.4%   Increased risk of developing Diabetes Mellitus  References:Diagnosis and Classification of Diabetes Mellitus,Diabetes Care,2011,34(Suppl 1):S62-S69 and Standards of Medical Care in         Diabetes - 2011,Diabetes [id number]  (Suppl 1):S11-S61.  - At today's visit, we checked his HbA1c and this is 5.8%, low in the prediabetic range, and lower than before  Orders Placed This Encounter  Procedures   US THYROID   TSH   Calcium   Vitamin D, 25-hydroxy   POCT glycosylated hemoglobin (Hb A1C)   Component     Latest Ref Rng 05/25/2022  TSH     0.35 - 5.50 uIU/mL 1.33   Calcium     8.4 - 10.5 mg/dL 9.8   VITD     30.00 - 100.00 ng/mL 37.28   Hemoglobin A1C     4.0 - 5.6 % 5.8 !   Thyroid test, calcium, and vitamin D level are all normal.  Philemon Kingdom, MD PhD [facility]

## 2022-05-29 NOTE — Progress Notes (Unsigned)
05/30/2022 9:23 AM   Antonio Russell [birth year] [id number]  Referring provider: Abner Greenspan, MD [address] [place],  [place] [address]  Chief Complaint  Patient presents with   Benign Prostatic Hypertrophy   Erectile Dysfunction   Urological history: 1. High risk hematuria - former-smoker - CT angio abd/pelvis 09/2019 1.4 cm low-attenuation left adrenal nodule, previously 1.2 cm, presumably benign adenoma. Right adrenal unremarkable. Stable 1.2 cm cyst from the lower pole right kidney.  No hydronephrosis. Urinary bladder incompletely distended.  Mild prostatic enlargement with central coarse calcifications. - cysto 09/2019 Prostate moderate lateral lobe enlargement with hypervascularity and friability - moderate elevation bladder neck - no reports of gross heme - UA negative for microscopic hematuria  2. Nephrolithiasis - s/p ESWL for 1 cm right UPJ stone - history of hyperparathyroidism - s/p parathyroidectomy 02/2020 - KUB 05/27/2020 no stones seen   3. BPH with LU TS -PSA (05/2022) 0.7 -I PSS 2/0  4. ED -contributing factors of age, CAD, HTN, testosterone deficiency, anxiety, HLD and former smoker -SHIM 90 -not a candidate for PDE5i's - on nitroglycerin   HPI: Antonio Russell is a 62 y.o. who presents today for yearly follow up.    I PSS 2/0  Nocturia x 2.  Patient denies any modifying or aggravating factors.  Patient denies any gross hematuria, dysuria or suprapubic/flank pain.  Patient denies any fevers, chills, nausea or vomiting.    UA yellow clear, specific gravity 1.015, trace blood, pH 5.5, 0-5 WBCs, 0-2 RBCs, 0-10 epithelial cells and a few bacteria.    IPSS     Row Name 05/30/22 0900         International Prostate Symptom Score   How often have you had the sensation of not emptying your bladder? Not at All     How often have you had to urinate less than every two hours? Not at All     How often have you found you stopped and started again  several times when you urinated? Not at All     How often have you found it difficult to postpone urination? Not at All     How often have you had a weak urinary stream? Not at All     How often have you had to strain to start urination? Not at All     How many times did you typically get up at night to urinate? 2 Times     Total IPSS Score 2       Quality of Life due to urinary symptoms   If you were to spend the rest of your life with your urinary condition just the way it is now how would you feel about that? Delighted                Score:  1-7 Mild 8-19 Moderate 20-35 Severe   SHIM 19  Patient still having spontaneous erections.   He denies any pain or curvature with erections.  He is taking tadalafil 5 mg daily.   SHIM     Row Name 05/30/22 0905         SHIM: Over the last 6 months:   How do you rate your confidence that you could get and keep an erection? Moderate     When you had erections with sexual stimulation, how often were your erections hard enough for penetration (entering your partner)? Most Times (much more than half the time)     During sexual  intercourse, how often were you able to maintain your erection after you had penetrated (entered) your partner? Most Times (much more than half the time)     During sexual intercourse, how difficult was it to maintain your erection to completion of intercourse? Slightly Difficult     When you attempted sexual intercourse, how often was it satisfactory for you? Most Times (much more than half the time)       SHIM Total Score   SHIM 19                Score: 1-7 Severe ED 8-11 Moderate ED 12-16 Mild-Moderate ED 17-21 Mild ED 22-25 No ED  PMH: Past Medical History:  Diagnosis Date   Allergic rhinitis    Allergy    Anxiety    Arthritis    Back pain    CAD (coronary artery disease)    a. 2012 Cath: nonobstructive, nl LV fxn, rec aggressive med management; b. 05/2020 Cath/PCI: LM nl, LAD 44m RI mod  diff dzs, LCX 65p/m, OM3 20, RCA 30p, 971m2.5x12 Resolute Onyx DES).   GERD (gastroesophageal reflux disease)    Headache(784.0)    History of kidney stones    HLD (hyperlipidemia)    Hyperparathyroidism (HCC)    Hypertension    Impotence of organic origin    Other testicular hypofunction    Peripheral vascular disease (HCC)    Personal history of urinary calculi    Sleep apnea    no c-pap or bi-pap   Thyroid disease    Hyperparathyroidism   Tobacco use disorder    Unspecified hearing loss     Surgical History: Past Surgical History:  Procedure Laterality Date   CARDIAC CATHETERIZATION     COLONOSCOPY     CORONARY STENT INTERVENTION N/A 06/14/2020   Procedure: CORONARY STENT INTERVENTION;  Surgeon: EnNelva BushMD;  [facility];  Service: Cardiovascular;  Laterality: N/A;   ENDOVASCULAR REPAIR/STENT GRAFT N/A 08/12/2019   Procedure: ENDOVASCULAR REPAIR/STENT GRAFT;  Surgeon: DeAlgernon HuxleyMD;  [facility];  Service: Cardiovascular;  Laterality: N/A;   EXTRACORPOREAL SHOCK WAVE LITHOTRIPSY Right 10/29/2019   Procedure: EXTRACORPOREAL SHOCK WAVE LITHOTRIPSY (ESWL);  Surgeon: SnBilley CoMD;  Location: [facility];  Service: Urology;  Laterality: Right;   HEMORRHOID SURGERY     HEMORRHOID SURGERY     KNEE SURGERY Right 2013   LEFT HEART CATH AND CORONARY ANGIOGRAPHY Left 06/14/2020   Procedure: LEFT HEART CATH AND CORONARY ANGIOGRAPHY;  Surgeon: EnNelva BushMD;  [facility];  Service: Cardiovascular;  Laterality: Left;   NASAL SEPTUM SURGERY     PARATHYROID EXPLORATION N/A 02/12/2020   Procedure: PARATHYROID EXPLORATION WITH PARATHROIDECTOMY;  Surgeon: GeArmandina GemmaMD;  Location: [facility];  Service: General;  Laterality: N/A;    Home Medications:  Current Outpatient Medications on File Prior to Visit  Medication Sig Dispense Refill   aspirin EC 81 MG tablet Take 1 tablet (81 mg total) by mouth daily. Swallow  whole. 90 tablet 3   Cholecalciferol (VITAMIN D3) 50 MCG (2000 UT) CAPS Take by mouth.     fexofenadine (ALLEGRA) 180 MG tablet Take 180 mg by mouth daily.      fluticasone (FLONASE) 50 MCG/ACT nasal spray SPRAY 2 SPRAYS INTO EACH NOSTRIL EVERY DAY 16 g 5   losartan (COZAAR) 50 MG tablet Take 1 tablet (50 mg total) by mouth in the morning and at bedtime. 90 tablet 3  REPATHA SURECLICK [id number] MG/ML SOAJ Inject 140 mg into the muscle every 14 (fourteen) days. 2 mL 3   No current facility-administered medications on file prior to visit.    Allergies:  Allergies  Allergen Reactions   Sertraline Hcl Other (See Comments)    Tremors   Amlodipine Other (See Comments)    Sexual side effects   Crestor [Rosuvastatin Calcium] Other (See Comments)    Myalgia   Lipitor [Atorvastatin Calcium] Other (See Comments)    Myalgia   Zocor [Simvastatin - High Dose] Other (See Comments)    Myalgia     Family History: Family History  Problem Relation Age of Onset   Hypertension Mother    Hyperlipidemia Mother    Diabetes Mother    Hypertension Father    Hyperlipidemia Father    Cancer Father        throat   Hypertension Sister    Esophageal cancer Paternal Uncle    Lung cancer Other        GF   Colon cancer Other        dx "close to 38"   Heart disease Other        GM   Stomach cancer Neg Hx    Rectal cancer Neg Hx     Social History:  reports that he quit smoking about 2 years ago. His smoking use included cigarettes. He has a 8.75 pack-year smoking history. He has been exposed to tobacco smoke. He has quit using smokeless tobacco.  His smokeless tobacco use included chew and snuff. He reports current alcohol use of about 4.0 - 5.0 standard drinks of alcohol per week. He reports that he does not use drugs.  ROS: Pertinent ROS in HPI  Physical Exam: BP (!) 160/83   Pulse 92   Ht 6' (1.829 m)   Wt 201 lb (91.2 kg)   BMI 27.26 kg/m   Constitutional:  Well nourished. Alert and oriented,  No acute distress. HEENT: [place] AT, moist mucus membranes.  Trachea midline Cardiovascular: No clubbing, cyanosis, or edema. Respiratory: Normal respiratory effort, no increased work of breathing. GU: No CVA tenderness.  No bladder fullness or masses.  Patient with uncircumcised phallus.  Foreskin easily retracted  Small stable mole on foreskin (evaluated by dermatologist) Urethral meatus is patent.  No penile discharge. No penile lesions or rashes. Scrotum without lesions, cysts, rashes and/or edema.  Testicles are located scrotally bilaterally. No masses are appreciated in the testicles. Left and right epididymis are normal. Rectal: Patient with  normal sphincter tone. Anus and perineum without scarring or rashes. No rectal masses are appreciated. Prostate is approximately 50 + grams, could only apex and midportion of the gland, no nodules are appreciated. Seminal vesicles could not be palpated.  Neurologic: Grossly intact, no focal deficits, moving all 4 extremities. Psychiatric: Normal mood and affect.   Laboratory Data: Hemoglobin A1c (05/2022) 5.8 PSA (05/2022) 0.7 Total cholesterol (05/2022) 164 TSH (05/2022) 1.33     Component Value Date/Time   CHOL 164 05/24/2022 1008   HDL 64 05/24/2022 1008   CHOLHDL 2.6 05/24/2022 1008   VLDL 28 05/24/2022 1008   LDLCALC 72 05/24/2022 1008    Urinalysis See EPIC and HPI I have reviewed the labs.   Pertinent Imaging: N/A   Assessment & Plan:   1. High risk hematuria -former smoker -work up in 09/2019- Hypervascular prostate -no reports of gross heme -UA negative for micro heme  3. BPH with LUTS -PSA stable -DRE benign  -  UA benign  -continue conservative management, avoiding bladder irritants and timed voiding's   4. ED -continue tadalafil 5 mg daily    Return in about 1 year (around 05/31/2023) for IPSS, SHIM, UA, PSA and exam.  These notes generated with voice recognition software. I apologize for typographical  errors.  [place] [place] [address]  [address] [place], [place] [address] [phone]

## 2022-05-30 ENCOUNTER — Encounter: Payer: Self-pay | Admitting: Urology

## 2022-05-30 ENCOUNTER — Ambulatory Visit (INDEPENDENT_AMBULATORY_CARE_PROVIDER_SITE_OTHER): Payer: BC Managed Care – PPO | Admitting: Urology

## 2022-05-30 VITALS — BP 160/83 | HR 92 | Ht 72.0 in | Wt 201.0 lb

## 2022-05-30 DIAGNOSIS — N529 Male erectile dysfunction, unspecified: Secondary | ICD-10-CM

## 2022-05-30 DIAGNOSIS — R319 Hematuria, unspecified: Secondary | ICD-10-CM

## 2022-05-30 DIAGNOSIS — N401 Enlarged prostate with lower urinary tract symptoms: Secondary | ICD-10-CM

## 2022-05-30 DIAGNOSIS — N138 Other obstructive and reflux uropathy: Secondary | ICD-10-CM

## 2022-05-30 LAB — URINALYSIS, COMPLETE
Bilirubin, UA: NEGATIVE
Glucose, UA: NEGATIVE
Ketones, UA: NEGATIVE
Leukocytes,UA: NEGATIVE
Nitrite, UA: NEGATIVE
Protein,UA: NEGATIVE
Specific Gravity, UA: 1.015 (ref 1.005–1.030)
Urobilinogen, Ur: 0.2 mg/dL (ref 0.2–1.0)
pH, UA: 5.5 (ref 5.0–7.5)

## 2022-05-30 LAB — MICROSCOPIC EXAMINATION

## 2022-05-30 MED ORDER — TADALAFIL 5 MG PO TABS: 5.0000 mg | ORAL_TABLET | Freq: Every day | ORAL | 11 refills | Status: DC

## 2022-06-18 ENCOUNTER — Other Ambulatory Visit (INDEPENDENT_AMBULATORY_CARE_PROVIDER_SITE_OTHER): Payer: Self-pay | Admitting: Vascular Surgery

## 2022-06-18 DIAGNOSIS — I70213 Atherosclerosis of native arteries of extremities with intermittent claudication, bilateral legs: Secondary | ICD-10-CM

## 2022-06-18 DIAGNOSIS — I714 Abdominal aortic aneurysm, without rupture, unspecified: Secondary | ICD-10-CM

## 2022-06-19 ENCOUNTER — Ambulatory Visit (INDEPENDENT_AMBULATORY_CARE_PROVIDER_SITE_OTHER): Payer: BC Managed Care – PPO

## 2022-06-19 ENCOUNTER — Ambulatory Visit (INDEPENDENT_AMBULATORY_CARE_PROVIDER_SITE_OTHER): Payer: BC Managed Care – PPO | Admitting: Vascular Surgery

## 2022-06-19 ENCOUNTER — Encounter (INDEPENDENT_AMBULATORY_CARE_PROVIDER_SITE_OTHER): Payer: Self-pay | Admitting: Vascular Surgery

## 2022-06-19 VITALS — BP 141/93 | HR 76 | Resp 18 | Ht 72.0 in | Wt 201.8 lb

## 2022-06-19 DIAGNOSIS — I6523 Occlusion and stenosis of bilateral carotid arteries: Secondary | ICD-10-CM

## 2022-06-19 DIAGNOSIS — I10 Essential (primary) hypertension: Secondary | ICD-10-CM

## 2022-06-19 DIAGNOSIS — E7800 Pure hypercholesterolemia, unspecified: Secondary | ICD-10-CM

## 2022-06-19 DIAGNOSIS — I714 Abdominal aortic aneurysm, without rupture, unspecified: Secondary | ICD-10-CM | POA: Diagnosis not present

## 2022-06-19 DIAGNOSIS — I70213 Atherosclerosis of native arteries of extremities with intermittent claudication, bilateral legs: Secondary | ICD-10-CM | POA: Diagnosis not present

## 2022-06-19 DIAGNOSIS — I7143 Infrarenal abdominal aortic aneurysm, without rupture: Secondary | ICD-10-CM | POA: Diagnosis not present

## 2022-06-19 NOTE — Progress Notes (Signed)
MRN : [id number]  Antonio Russell is a 62 y.o. (Aug 09, 1960) male who presents with chief complaint of  Chief Complaint  Patient presents with   Follow-up    81yr. evar. abi.  [name]  History of Present Illness: Patient returns today in follow up of multiple vascular issues.  A few years ago, he underwent endovascular abdominal aortic aneurysm repair as well as concomitant iliac artery revascularization for short distance claudication.  He is doing well.  He really has had resolution of his claudication symptoms and no current complaints.  He has no current aneurysm related symptoms.  His aortic duplex today shows a patent stent graft without endoleak or stenosis.  Aortic sac size has decreased down to 4.6 cm in maximal diameter. ABIs today are in the normal range 1.14 on the right and 1.21 on the left with triphasic waveforms and normal digital pressures bilaterally.  Current Outpatient Medications  Medication Sig Dispense Refill   aspirin EC 81 MG tablet Take 1 tablet (81 mg total) by mouth daily. Swallow whole. 90 tablet 3   Cholecalciferol (VITAMIN D3) 50 MCG (2000 UT) CAPS Take by mouth.     fexofenadine (ALLEGRA) 180 MG tablet Take 180 mg by mouth daily.      fluticasone (FLONASE) 50 MCG/ACT nasal spray SPRAY 2 SPRAYS INTO EACH NOSTRIL EVERY DAY 16 g 5   losartan (COZAAR) 50 MG tablet Take 1 tablet (50 mg total) by mouth in the morning and at bedtime. 90 tablet 3   REPATHA SURECLICK [id number] MG/ML SOAJ Inject 140 mg into the muscle every 14 (fourteen) days. 2 mL 3   tadalafil (CIALIS) 5 MG tablet Take 1 tablet (5 mg total) by mouth daily. 30 tablet 11   No current facility-administered medications for this visit.    Past Medical History:  Diagnosis Date   Allergic rhinitis    Allergy    Anxiety    Arthritis    Back pain    CAD (coronary artery disease)    a. 2012 Cath: nonobstructive, nl LV fxn, rec aggressive med management; b. 05/2020 Cath/PCI: LM nl, LAD 54m, RI mod diff dzs, LCX  65p/m, OM3 20, RCA 30p, 90m (2.5x12 Resolute Onyx DES).   GERD (gastroesophageal reflux disease)    Headache(784.0)    History of kidney stones    HLD (hyperlipidemia)    Hyperparathyroidism (HCC)    Hypertension    Impotence of organic origin    Other testicular hypofunction    Peripheral vascular disease (HCC)    Personal history of urinary calculi    Sleep apnea    no c-pap or bi-pap   Thyroid disease    Hyperparathyroidism   Tobacco use disorder    Unspecified hearing loss     Past Surgical History:  Procedure Laterality Date   CARDIAC CATHETERIZATION     COLONOSCOPY     CORONARY STENT INTERVENTION N/A 06/14/2020   Procedure: CORONARY STENT INTERVENTION;  Surgeon: Nelva Bush, MD;  Location: [facility];  Service: Cardiovascular;  Laterality: N/A;   ENDOVASCULAR REPAIR/STENT GRAFT N/A 08/12/2019   Procedure: ENDOVASCULAR REPAIR/STENT GRAFT;  Surgeon: Algernon Huxley, MD;  Location: [facility];  Service: Cardiovascular;  Laterality: N/A;   EXTRACORPOREAL SHOCK WAVE LITHOTRIPSY Right 10/29/2019   Procedure: EXTRACORPOREAL SHOCK WAVE LITHOTRIPSY (ESWL);  Surgeon: Billey Co, MD;  Location: [facility];  Service: Urology;  Laterality: Right;   HEMORRHOID SURGERY     HEMORRHOID SURGERY  KNEE SURGERY Right 2013   LEFT HEART CATH AND CORONARY ANGIOGRAPHY Left 06/14/2020   Procedure: LEFT HEART CATH AND CORONARY ANGIOGRAPHY;  Surgeon: Nelva Bush, MD;  Location: [facility];  Service: Cardiovascular;  Laterality: Left;   NASAL SEPTUM SURGERY     PARATHYROID EXPLORATION N/A 02/12/2020   Procedure: PARATHYROID EXPLORATION WITH PARATHROIDECTOMY;  Surgeon: Armandina Gemma, MD;  Location: [facility];  Service: General;  Laterality: N/A;     Social History   Tobacco Use   Smoking status: Former    Packs/day: 0.25    Years: 35.00    Additional pack years: 0.00    Total pack years: 8.75    Types: Cigarettes    Quit date: 10/08/2019    Years since  quitting: 2.6    Passive exposure: Past   Smokeless tobacco: Former    Types: Chew, Snuff   Tobacco comments:    couple of months - 40 yrs ago  Vaping Use   Vaping Use: Former  Substance Use Topics   Alcohol use: Yes    Alcohol/week: 4.0 - 5.0 standard drinks of alcohol    Types: 4 - 5 Cans of beer per week    Comment: daily    Drug use: No       Family History  Problem Relation Age of Onset   Hypertension Mother    Hyperlipidemia Mother    Diabetes Mother    Hypertension Father    Hyperlipidemia Father    Cancer Father        throat   Hypertension Sister    Esophageal cancer Paternal Uncle    Lung cancer Other        GF   Colon cancer Other        dx "close to 81"   Heart disease Other        GM   Stomach cancer Neg Hx    Rectal cancer Neg Hx      Allergies  Allergen Reactions   Sertraline Hcl Other (See Comments)    Tremors   Amlodipine Other (See Comments)    Sexual side effects   Crestor [Rosuvastatin Calcium] Other (See Comments)    Myalgia   Lipitor [Atorvastatin Calcium] Other (See Comments)    Myalgia   Zocor [Simvastatin - High Dose] Other (See Comments)    Myalgia      REVIEW OF SYSTEMS (Negative unless checked)  Constitutional: [] Weight loss  [] Fever  [] Chills Cardiac: [] Chest pain   [] Chest pressure   [] Palpitations   [] Shortness of breath when laying flat   [] Shortness of breath at rest   [] Shortness of breath with exertion. Vascular:  [x] Pain in legs with walking   [] Pain in legs at rest   [] Pain in legs when laying flat   [] Claudication   [] Pain in feet when walking  [] Pain in feet at rest  [] Pain in feet when laying flat   [] History of DVT   [] Phlebitis   [] Swelling in legs   [] Varicose veins   [] Non-healing ulcers Pulmonary:   [] Uses home oxygen   [] Productive cough   [] Hemoptysis   [] Wheeze  [] COPD   [] Asthma Neurologic:  [] Dizziness  [] Blackouts   [] Seizures   [] History of stroke   [] History of TIA  [] Aphasia   [] Temporary blindness    [] Dysphagia   [] Weakness or numbness in arms   [] Weakness or numbness in legs Musculoskeletal:  [] Arthritis   [] Joint swelling   [x] Joint pain   [] Low back pain Hematologic:  []   Easy bruising  [] Easy bleeding   [] Hypercoagulable state   [] Anemic   Gastrointestinal:  [] Blood in stool   [] Vomiting blood  [] Gastroesophageal reflux/heartburn   [] Abdominal pain Genitourinary:  [] Chronic kidney disease   [] Difficult urination  [] Frequent urination  [] Burning with urination   [] Hematuria Skin:  [] Rashes   [] Ulcers   [] Wounds Psychological:  [] History of anxiety   []  History of major depression.  Physical Examination  BP (!) 141/93 (BP Location: Right Arm)   Pulse 76   Resp 18   Ht 6' (1.829 m)   Wt 201 lb 12.8 oz (91.5 kg)   BMI 27.37 kg/m  Gen:  WD/WN, NAD Head: [place]/AT, No temporalis wasting. Ear/Nose/Throat: Hearing grossly intact, nares w/o erythema or drainage Eyes: Conjunctiva clear. Sclera non-icteric Neck: Supple.  Trachea midline Pulmonary:  Good air movement, no use of accessory muscles.  Cardiac: RRR, no JVD Vascular:  Vessel Right Left  Radial Palpable Palpable                          PT Palpable Palpable  DP Palpable Palpable   Gastrointestinal: soft, non-tender/non-distended. No guarding/reflex.  Musculoskeletal: M/S 5/5 throughout.  No deformity or atrophy. No edema. Neurologic: Sensation grossly intact in extremities.  Symmetrical.  Speech is fluent.  Psychiatric: Judgment intact, Mood & affect appropriate for pt's clinical situation. Dermatologic: No rashes or ulcers noted.  No cellulitis or open wounds.      Labs Recent Results (from the past 2160 hour(s))  Lipid panel     Status: None   Collection Time: 05/24/22 10:08 AM  Result Value Ref Range   Cholesterol 164 0 - 200 mg/dL   Triglycerides 139 <150 mg/dL   HDL 64 >40 mg/dL   Total CHOL/HDL Ratio 2.6 RATIO   VLDL 28 0 - 40 mg/dL   LDL Cholesterol 72 0 - 99 mg/dL    Comment:        Total  Cholesterol/HDL:CHD Risk Coronary Heart Disease Risk Table                     Men   Women  1/2 Average Risk   3.4   3.3  Average Risk       5.0   4.4  2 X Average Risk   9.6   7.1  3 X Average Risk  23.4   11.0        Use the calculated Patient Ratio above and the CHD Risk Table to determine the patient's CHD Risk.        ATP III CLASSIFICATION (LDL):  <100     mg/dL   Optimal  100-129  mg/dL   Near or Above                    Optimal  130-159  mg/dL   Borderline  160-189  mg/dL   High  >190     mg/dL   Very High Performed at [facility], [address] [place], [place] [address]   PSA     Status: None   Collection Time: 05/24/22 10:17 AM  Result Value Ref Range   Prostate Specific Ag, Serum 0.7 0.0 - 4.0 ng/mL    Comment: Roche ECLIA methodology. According to the American Urological Association, Serum PSA should decrease and remain at undetectable levels after radical prostatectomy. The AUA defines biochemical recurrence as an initial PSA value 0.2 ng/mL or greater followed by a  subsequent confirmatory PSA value 0.2 ng/mL or greater. Values obtained with different assay methods or kits cannot be used interchangeably. Results cannot be interpreted as absolute evidence of the presence or absence of malignant disease.   POCT glycosylated hemoglobin (Hb A1C)     Status: Abnormal   Collection Time: 05/25/22  8:23 AM  Result Value Ref Range   Hemoglobin A1C 5.8 (A) 4.0 - 5.6 %   HbA1c POC (<> result, manual entry)     HbA1c, POC (prediabetic range)     HbA1c, POC (controlled diabetic range)    TSH     Status: None   Collection Time: 05/25/22  8:24 AM  Result Value Ref Range   TSH 1.33 0.35 - 5.50 uIU/mL  Calcium     Status: None   Collection Time: 05/25/22  8:24 AM  Result Value Ref Range   Calcium 9.8 8.4 - 10.5 mg/dL  Vitamin D, 25-hydroxy     Status: None   Collection Time: 05/25/22  8:24 AM  Result Value Ref Range   VITD 37.28 30.00 - 100.00 ng/mL   Urinalysis, Complete     Status: Abnormal   Collection Time: 05/30/22  8:55 AM  Result Value Ref Range   Specific Gravity, UA 1.015 1.005 - 1.030   pH, UA 5.5 5.0 - 7.5   Color, UA Yellow Yellow   Appearance Ur Clear Clear   Leukocytes,UA Negative Negative   Protein,UA Negative Negative/Trace   Glucose, UA Negative Negative   Ketones, UA Negative Negative   RBC, UA Trace (A) Negative   Bilirubin, UA Negative Negative   Urobilinogen, Ur 0.2 0.2 - 1.0 mg/dL   Nitrite, UA Negative Negative   Microscopic Examination See below:   Microscopic Examination     Status: None   Collection Time: 05/30/22  8:55 AM   Urine  Result Value Ref Range   WBC, UA 0-5 0 - 5 /hpf   RBC, Urine 0-2 0 - 2 /hpf   Epithelial Cells (non renal) 0-10 0 - 10 /hpf   Bacteria, UA Few None seen/Few    Radiology No results found.  Assessment/Plan Essential hypertension blood pressure control important in reducing the progression of atherosclerotic disease. On appropriate oral medications.     Hyperlipidemia lipid control important in reducing the progression of atherosclerotic disease. Continue statin therapy     Carotid stenosis We performed a carotid duplex a couple years ago which revealed 1 to 39% bilateral carotid artery stenosis with mild disease.  On appropriate antiplatelet therapy.  This can be checked every 2 to 3 years so can check at his next follow up visit.  AAA (abdominal aortic aneurysm) without rupture (HCC) His aortic duplex today shows a patent stent graft without endoleak or stenosis.  Aortic sac size has decreased down to 4.6 cm in maximal diameter.  No change in medications.  Continue to follow on an annual basis with duplex.  Atherosclerosis of native arteries of extremity with intermittent claudication (HCC) ABIs today are in the normal range 1.14 on the right and 1.21 on the left with triphasic waveforms and normal digital pressures bilaterally.  Symptoms are markedly improved  after revascularization.  Continue current medical regimen.  Recheck in 1 year.    [name], MD  06/19/2022 10:29 AM    This note was created with Dragon medical transcription system.  Any errors from dictation are purely unintentional

## 2022-06-19 NOTE — Assessment & Plan Note (Addendum)
ABIs today are in the normal range 1.14 on the right and 1.21 on the left with triphasic waveforms and normal digital pressures bilaterally.  Symptoms are markedly improved after revascularization.  Continue current medical regimen.  Recheck in 1 year.

## 2022-06-19 NOTE — Assessment & Plan Note (Signed)
His aortic duplex today shows a patent stent graft without endoleak or stenosis.  Aortic sac size has decreased down to 4.6 cm in maximal diameter.  No change in medications.  Continue to follow on an annual basis with duplex.

## 2022-06-22 LAB — VAS US ABI WITH/WO TBI
Left ABI: 1.21
Right ABI: 1.14

## 2022-06-29 ENCOUNTER — Ambulatory Visit
Admission: RE | Admit: 2022-06-29 | Discharge: 2022-06-29 | Disposition: A | Discharge status: Home / Self Care | Payer: BC Managed Care – PPO | Source: Ambulatory Visit | Attending: Internal Medicine | Admitting: Internal Medicine

## 2022-06-29 DIAGNOSIS — E042 Nontoxic multinodular goiter: Secondary | ICD-10-CM | POA: Diagnosis not present

## 2022-08-17 ENCOUNTER — Encounter: Payer: Self-pay | Admitting: Cardiology

## 2022-08-17 ENCOUNTER — Ambulatory Visit: Payer: BC Managed Care – PPO | Attending: Cardiology | Admitting: Cardiology

## 2022-08-17 VITALS — BP 134/94 | HR 93 | Ht 72.0 in | Wt 198.4 lb

## 2022-08-17 DIAGNOSIS — E785 Hyperlipidemia, unspecified: Secondary | ICD-10-CM

## 2022-08-17 DIAGNOSIS — I2510 Atherosclerotic heart disease of native coronary artery without angina pectoris: Secondary | ICD-10-CM

## 2022-08-17 DIAGNOSIS — I10 Essential (primary) hypertension: Secondary | ICD-10-CM | POA: Diagnosis not present

## 2022-08-17 NOTE — Patient Instructions (Signed)
Medication Instructions:   Your physician recommends that you continue on your current medications as directed. Please refer to the Current Medication list given to you today.  *If you need a refill on your cardiac medications before your next appointment, please call your pharmacy*   Lab Work:  None Ordered  If you have labs (blood work) drawn today and your tests are completely normal, you will receive your results only by: MyChart Message (if you have MyChart) OR A paper copy in the mail If you have any lab test that is abnormal or we need to change your treatment, we will call you to review the results.   Testing/Procedures:  None Ordered    Follow-Up: At [facility], you and your health needs are our priority.  As part of our continuing mission to provide you with exceptional heart care, we have created designated Provider Care Teams.  These Care Teams include your primary Cardiologist (physician) and Advanced Practice Providers (APPs -  Physician Assistants and Nurse Practitioners) who all work together to provide you with the care you need, when you need it.  We recommend signing up for the patient portal called "MyChart".  Sign up information is provided on this After Visit Summary.  MyChart is used to connect with patients for Virtual Visits (Telemedicine).  Patients are able to view lab/test results, encounter notes, upcoming appointments, etc.  Non-urgent messages can be sent to your provider as well.   To learn more about what you can do with MyChart, go to [email or web address].    Your next appointment:   12 month(s)  Provider:   You may see Brian Agbor-Etang, MD or one of the following Advanced Practice Providers on your designated Care Team:   Christopher Berge, NP Ryan Dunn, PA-C Cadence Furth, PA-C Sheri Hammock, NP  

## 2022-08-17 NOTE — Progress Notes (Signed)
Cardiology Office Note:    Date:  08/17/2022   ID:  Antonio Russell, DOB [birth year], MRN [id number]  PCP:  Judy Pimple, MD  Cardiologist:  Debbe Odea, MD  Electrophysiologist:  None   Referring MD: Judy Pimple, MD   Chief Complaint  Patient presents with   Follow-up    Patient denies new or acute cardiac problems/concerns today.      History of Present Illness:    Antonio Russell is a 62 y.o. male with a hx of CAD/PCI to [facility] 05/2020 (LAD 50%, mLCx 60-70%), hyperlipidemia, hypertension, former smoker x40+ years, AAA s/p endoprosthetic repair 08/2019,  peripheral arterial disease s/p R and L iliac stents, sleep apnea who presents for follow-up.   Denies chest pain or shortness of breath.  Compliant with medications as prescribed.  Blood pressure at home runs around 140s systolic.  Currently on Repatha due to statin intolerance.   Prior notes Echocardiogram 05/2020 showed normal systolic and diastolic function, EF 55 to 60%  Coronary CTA 05/2020 showed calcium score 458, severe stenosis in the proximal left circumflex, moderate stenosis in the proximal RCA, CT FFR showed significant mid left circumflex stenosis.   Left heart cath 06/14/2020 showed 50% mid LAD stenosis, 60 to 70% proximal/mid left circumflex disease, 95% mid RCA lesion.  Underwent PCI to mid RCA using drug-eluting stent.   Past Medical History:  Diagnosis Date   Allergic rhinitis    Allergy    Anxiety    Arthritis    Back pain    CAD (coronary artery disease)    a. 2012 Cath: nonobstructive, nl LV fxn, rec aggressive med management; b. 05/2020 Cath/PCI: LM nl, LAD 40m, RI mod diff dzs, LCX 65p/m, OM3 20, RCA 30p, 64m (2.5x12 Resolute Onyx DES).   GERD (gastroesophageal reflux disease)    Headache(784.0)    History of kidney stones    HLD (hyperlipidemia)    Hyperparathyroidism (HCC)    Hypertension    Impotence of organic origin    Other testicular hypofunction    Peripheral vascular disease (HCC)     Personal history of urinary calculi    Sleep apnea    no c-pap or bi-pap   Thyroid disease    Hyperparathyroidism   Tobacco use disorder    Unspecified hearing loss     Past Surgical History:  Procedure Laterality Date   CARDIAC CATHETERIZATION     COLONOSCOPY     CORONARY STENT INTERVENTION N/A 06/14/2020   Procedure: CORONARY STENT INTERVENTION;  Surgeon: Yvonne Kendall, MD;  Location: [facility] INVASIVE CV LAB;  Service: Cardiovascular;  Laterality: N/A;   ENDOVASCULAR REPAIR/STENT GRAFT N/A 08/12/2019   Procedure: ENDOVASCULAR REPAIR/STENT GRAFT;  Surgeon: Annice Needy, MD;  Location: [facility] INV[facility] CV [facility];  Service: Cardiovascular;  Laterality: N/A;   EXTRACORPOREAL SHOCK WAVE LITHOTRIPSY Right 10/29/2019   Procedure: EXTRACORPOREAL SHOCK WAVE LITHOTRIPSY (ESWL);  Surgeon: Sondra Come, MD;  Location: [facility] ORS;  Service: Urology;  Laterality: Right;   HEMORRHOID SURGERY     HEMORRHOID SURGERY     KNEE SURGERY Right 2013   LEFT HEART CATH AND CORONARY ANGIOGRAPHY Left 06/14/2020   Procedure: LEFT HEART CATH AND CORONARY ANGIOGRAPHY;  Surgeon: Yvonne Kendall, MD;  Location: [facility] INV[facility] CV [facility];  Service: Cardiovascular;  Laterality: Left;   NASAL SEPTUM SURGERY     PARATHYROID EXPLORATION N/A 02/12/2020   Procedure: PARATHYROID EXPLORATION WITH PARATHROIDECTOMY;  Surgeon: Darnell Level, MD;  Location: [facility];  Service: General;  Laterality:  N/A;    Current Medications: Current Meds  Medication Sig   aspirin EC 81 MG tablet Take 1 tablet (81 mg total) by mouth daily. Swallow whole.   Cholecalciferol (VITAMIN D3) 50 MCG (2000 UT) CAPS Take by mouth.   fexofenadine (ALLEGRA) 180 MG tablet Take 180 mg by mouth daily.    fluticasone (FLONASE) 50 MCG/ACT nasal spray SPRAY 2 SPRAYS INTO EACH NOSTRIL EVERY DAY   losartan (COZAAR) 50 MG tablet Take 1 tablet (50 mg total) by mouth in the morning and at bedtime.   REPATHA SURECLICK 140 MG/ML SOAJ Inject 140 mg into the muscle every 14  (fourteen) days.   tadalafil (CIALIS) 5 MG tablet Take 1 tablet (5 mg total) by mouth daily.     Allergies:   Sertraline hcl, Amlodipine, Crestor [rosuvastatin calcium], Lipitor [atorvastatin calcium], and Zocor [simvastatin - high dose]   Social History   Socioeconomic History   Marital status: Married    Spouse name: Not on file   Number of children: Not on file   Years of education: Not on file   Highest education level: Not on file  Occupational History   Not on file  Tobacco Use   Smoking status: Former    Packs/day: 0.25    Years: 35.00    Additional pack years: 0.00    Total pack years: 8.75    Types: Cigarettes    Quit date: 10/08/2019    Years since quitting: 2.8    Passive exposure: Past   Smokeless tobacco: Former    Types: Chew, Snuff   Tobacco comments:    couple of months - 40 yrs ago  Vaping Use   Vaping Use: Former  Substance and Sexual Activity   Alcohol use: Yes    Alcohol/week: 4.0 - 5.0 standard drinks of alcohol    Types: 4 - 5 Cans of beer per week    Comment: daily    Drug use: No   Sexual activity: Yes    Birth control/protection: None  Other Topics Concern   Not on file  Social History Narrative   Coaches basketball   Social Determinants of Health   Financial Resource Strain: Not on file  Food Insecurity: Not on file  Transportation Needs: Not on file  Physical Activity: Not on file  Stress: Not on file  Social Connections: Not on file     Family History: The patient's family history includes Cancer in his father; Colon cancer in an other family member; Diabetes in his mother; Esophageal cancer in his paternal uncle; Heart disease in an other family member; Hyperlipidemia in his father and mother; Hypertension in his father, mother, and sister; Lung cancer in an other family member. There is no history of Stomach cancer or Rectal cancer.  ROS:   Please see the history of present illness.     All other systems reviewed and are  negative.  EKGs/Labs/Other Studies Reviewed:    The following studies were reviewed today:   EKG:  EKG not ordered today.   Recent Labs: 05/25/2022: TSH 1.33  Recent Lipid Panel    Component Value Date/Time   CHOL 164 05/24/2022 1008   TRIG 139 05/24/2022 1008   HDL 64 05/24/2022 1008   CHOLHDL 2.6 05/24/2022 1008   VLDL 28 05/24/2022 1008   LDLCALC 72 05/24/2022 1008   LDLDIRECT 225.0 05/01/2019 0938    Physical Exam:    VS:  BP (!) 134/94 (BP Location: Left Arm, Patient Position: Sitting, Cuff Size:  Normal)   Pulse 93   Ht 6' (1.829 m)   Wt 198 lb 6.4 oz (90 kg)   SpO2 98%   BMI 26.91 kg/m     Wt Readings from Last 3 Encounters:  08/17/22 198 lb 6.4 oz (90 kg)  06/19/22 201 lb 12.8 oz (91.5 kg)  05/30/22 201 lb (91.2 kg)     GEN:  Well nourished, well developed in no acute distress HEENT: Normal NECK: No JVD; No carotid bruits CARDIAC: RRR, no murmurs, rubs, gallops RESPIRATORY:  Clear to auscultation without rales, wheezing or rhonchi  ABDOMEN: Soft, non-tender, non-distended MUSCULOSKELETAL:  No edema; No deformity  SKIN: Warm and dry NEUROLOGIC:  Alert and oriented x 3 PSYCHIATRIC:  Normal affect   ASSESSMENT:    1. Coronary artery disease involving native coronary artery of native heart without angina pectoris   2. Hyperlipidemia LDL goal <70   3. Essential hypertension     PLAN:    CAD/PCI to mid RCA 05/2020.  Denies chest pain.  Continue aspirin, Repatha.  Cholesterol better controlled.. hyperlipidemia.  Cholesterol controlled.  Continue continue Repatha,  hypertension.  BP slightly elevated, much improved.  Continue losartan 50 mg twice daily.  Follow-up in 12 months  Medication Adjustments/Labs and Tests Ordered: Current medicines are reviewed at length with the patient today.  Concerns regarding medicines are outlined above.  No orders of the defined types were placed in this encounter.  No orders of the defined types were placed in this  encounter.   Patient Instructions  Medication Instructions:   Your physician recommends that you continue on your current medications as directed. Please refer to the Current Medication list given to you today.  *If you need a refill on your cardiac medications before your next appointment, please call your pharmacy*   Lab Work:  None Ordered  If you have labs (blood work) drawn today and your tests are completely normal, you will receive your results only by: MyChart Message (if you have MyChart) OR A paper copy in the mail If you have any lab test that is abnormal or we need to change your treatment, we will call you to review the results.   Testing/Procedures:  None Ordered   Follow-Up: At [facility], you and your health needs are our priority.  As part of our continuing mission to provide you with exceptional heart care, we have created designated Provider Care Teams.  These Care Teams include your primary Cardiologist (physician) and Advanced Practice Providers (APPs -  Physician Assistants and Nurse Practitioners) who all work together to provide you with the care you need, when you need it.  We recommend signing up for the patient portal called "MyChart".  Sign up information is provided on this After Visit Summary.  MyChart is used to connect with patients for Virtual Visits (Telemedicine).  Patients are able to view lab/test results, encounter notes, upcoming appointments, etc.  Non-urgent messages can be sent to your provider as well.   To learn more about what you can do with MyChart, go to [email or web address].    Your next appointment:   12 month(s)  Provider:   You may see Debbe Odea, MD or one of the following Advanced Practice Providers on your designated Care Team:   Nicolasa Ducking, NP Eula Listen, PA-C Cadence Fransico Michael, PA-C Charlsie Quest, NP    Signed, Debbe Odea, MD  08/17/2022 9:26 AM    [facility]

## 2022-09-21 IMAGING — CT CT HEART MORP W/ CTA COR W/ SCORE W/ CA W/CM &/OR W/O CM
2 of 14 series · 4 of 20 positions shown, 5 images · non-contrast
Comparison: None.

Addendum:
CLINICAL DATA: chestpain

EXAM:
Cardiac/Coronary  CTA
TECHNIQUE: The patient was scanned on a Siemens Somatoform go.Top scanner.

[Series 26: multiphase % cta coronary 0.60 · axial · 0.36mm/px · z∈[-1122,-1079]mm · 2 of 2226 slices shown, 3 images]
[im 742/2226  vessel]
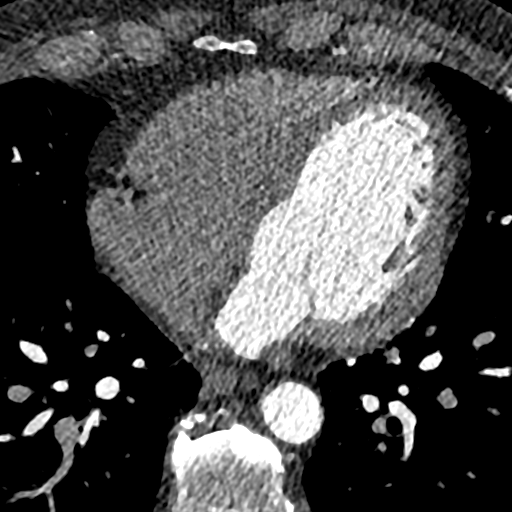
[im 742/2226  lung]
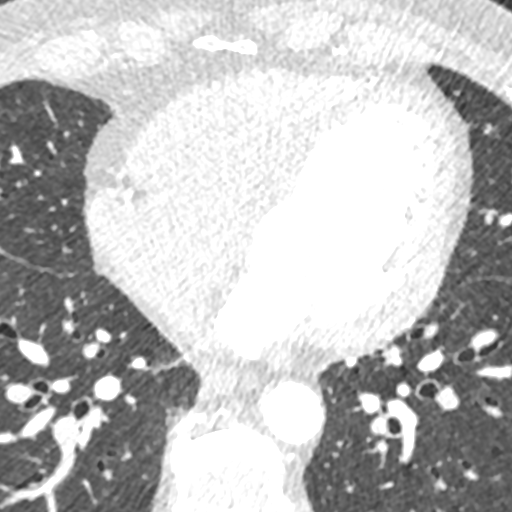
[im 1484/2226  vessel]
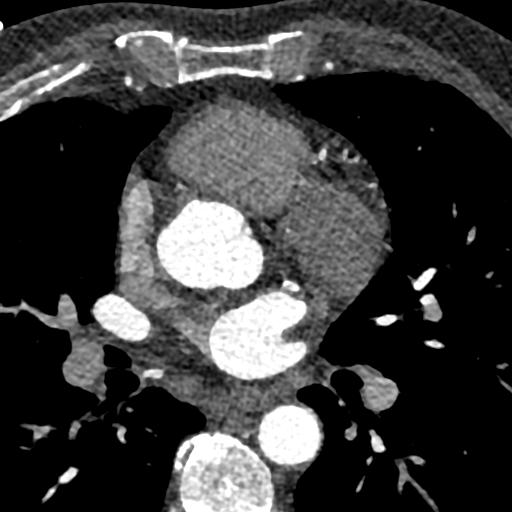

[Series 44: ms multiphase cta coronary 0.60 · axial · 0.36mm/px · z∈[-1122,-1079]mm · 2 of 2226 slices shown]
[im 742/2226  vessel]
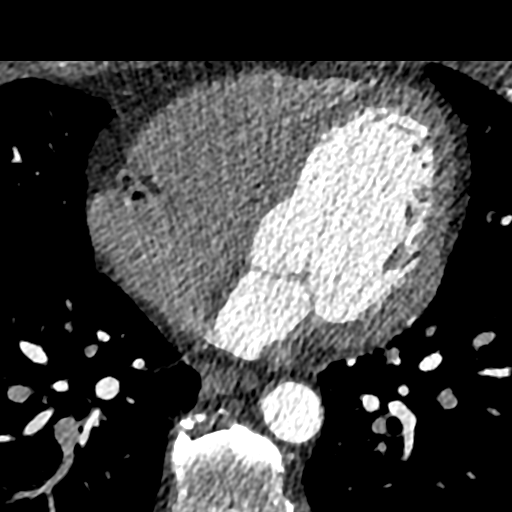
[im 1484/2226  vessel]
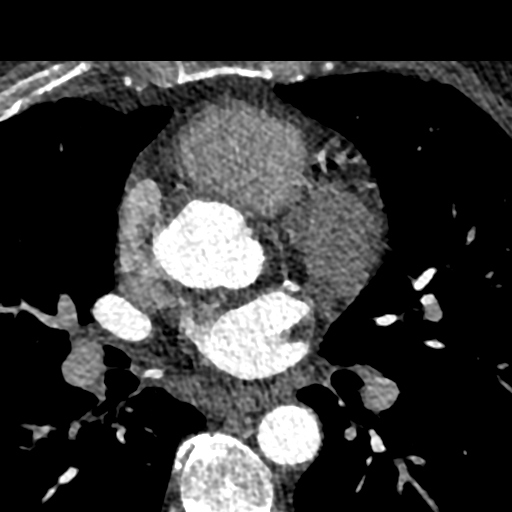

[4 of 20 positions shown; findings below may reference images not displayed]

FINDINGS: A retrospective scan was triggered in the descending thoracic aorta.
Axial non-contrast 3 mm slices were carried out through the heart.
The data set was analyzed on a dedicated work station and scored
using the Agatson method. Gantry rotation speed was 330 msecs and
collimation was .6 mm. 100mg of metoprolol and 0.8 mg of sl NTG was
given. The 3D data set was reconstructed in 5% intervals of the
60-95 % of the R-R cycle. Diastolic phases were analyzed on a
dedicated work station using MPR, MIP and VRT modes. The patient
received 75 cc of contrast.

Aorta:  Normal size.  No calcifications.  No dissection.

Aortic Valve:  Trileaflet.  mild calcifications.

Coronary Arteries:  Normal coronary origin.  Right dominance.

RCA is a dominant artery that gives rise to PDA and PLA. There is
calcified and non calcified plaque in the proximal RCA causing
atleast moderate stenosis >60%. Calcium and motion artifacts limits
accurate assessment for severe stenosis.

Left main is a large artery that gives rise to LAD and LCX arteries.

LAD has calcified and non calcified plaque in the mid segment
causing mild (25-49%) stenosis.

LCX is a non-dominant artery that gives rise to two obtuse marginal
branches. There is calcified and non calcified plaque in the
proximal LCx (>70%) causing severe stenosis.

Other findings:

Normal pulmonary vein drainage into the left atrium.

Normal left atrial appendage without a thrombus.

Normal size of the pulmonary artery.
IMPRESSION: 1. Coronary calcium score of 458. This was 92nd percentile for age
and sex matched control.

2. Normal coronary origin with right dominance.

3. Severe stenosis in the proximal LCx

4. Moderate stenosis in the proximal RCA

5. Mild stenosis in the mid LAD

6. CAD-RADS 4 Severe stenosis. (70-99% or > 50% left main). Cardiac
catheterization or CT FFR is recommended. Consider symptom-guided
anti-ischemic pharmacotherapy as well as risk factor modification
per guideline directed care. Additional analysis with CT FFR will be
submitted and reported separately.

EXAM:
OVER-READ INTERPRETATION  CT CHEST

The following report is an over-read performed by radiologist Dr.
over-read does not include interpretation of cardiac or coronary
anatomy or pathology. The coronary CTA interpretation by the
cardiologist is attached.
FINDINGS: No incidental vascular findings. Visualized mediastinum and hilar
regions demonstrate no lymphadenopathy or masses. Visualized lungs
show no evidence of pulmonary edema, consolidation, pneumothorax,
nodule or pleural fluid. Visualized upper abdomen and bony
structures are unremarkable.
IMPRESSION: No significant incidental findings.

*** End of Addendum ***
FINDINGS: A retrospective scan was triggered in the descending thoracic aorta.
Axial non-contrast 3 mm slices were carried out through the heart.
The data set was analyzed on a dedicated work station and scored
using the Agatson method. Gantry rotation speed was 330 msecs and
collimation was .6 mm. 100mg of metoprolol and 0.8 mg of sl NTG was
given. The 3D data set was reconstructed in 5% intervals of the
60-95 % of the R-R cycle. Diastolic phases were analyzed on a
dedicated work station using MPR, MIP and VRT modes. The patient
received 75 cc of contrast.

Aorta:  Normal size.  No calcifications.  No dissection.

Aortic Valve:  Trileaflet.  mild calcifications.

Coronary Arteries:  Normal coronary origin.  Right dominance.

RCA is a dominant artery that gives rise to PDA and PLA. There is
calcified and non calcified plaque in the proximal RCA causing
atleast moderate stenosis >60%. Calcium and motion artifacts limits
accurate assessment for severe stenosis.

Left main is a large artery that gives rise to LAD and LCX arteries.

LAD has calcified and non calcified plaque in the mid segment
causing mild (25-49%) stenosis.

LCX is a non-dominant artery that gives rise to two obtuse marginal
branches. There is calcified and non calcified plaque in the
proximal LCx (>70%) causing severe stenosis.

Other findings:

Normal pulmonary vein drainage into the left atrium.

Normal left atrial appendage without a thrombus.

Normal size of the pulmonary artery.
IMPRESSION: 1. Coronary calcium score of 458. This was 92nd percentile for age
and sex matched control.

2. Normal coronary origin with right dominance.

3. Severe stenosis in the proximal LCx

4. Moderate stenosis in the proximal RCA

5. Mild stenosis in the mid LAD

6. CAD-RADS 4 Severe stenosis. (70-99% or > 50% left main). Cardiac
catheterization or CT FFR is recommended. Consider symptom-guided
anti-ischemic pharmacotherapy as well as risk factor modification
per guideline directed care. Additional analysis with CT FFR will be
submitted and reported separately.

## 2022-12-27 ENCOUNTER — Other Ambulatory Visit: Payer: Self-pay

## 2022-12-27 MED ORDER — REPATHA SURECLICK 140 MG/ML ~~LOC~~ SOAJ: 140.0000 mg | SUBCUTANEOUS | 6 refills | Status: DC

## 2022-12-31 ENCOUNTER — Other Ambulatory Visit: Payer: Self-pay

## 2022-12-31 MED ORDER — REPATHA SURECLICK 140 MG/ML ~~LOC~~ SOAJ: 140.0000 mg | SUBCUTANEOUS | 6 refills | Status: AC

## 2022-12-31 NOTE — Telephone Encounter (Signed)
Requested Prescriptions   Signed Prescriptions Disp Refills   REPATHA SURECLICK 140 MG/ML SOAJ 2 mL 6    Sig: Inject 140 mg into the skin every 14 (fourteen) days.    Authorizing Provider: Debbe Odea    Ordering User: Guerry Minors

## 2023-01-09 ENCOUNTER — Encounter: Payer: Self-pay | Admitting: Gastroenterology

## 2023-01-30 ENCOUNTER — Other Ambulatory Visit: Payer: Self-pay

## 2023-01-30 MED ORDER — LOSARTAN POTASSIUM 50 MG PO TABS: 50.0000 mg | ORAL_TABLET | Freq: Two times a day (BID) | ORAL | 1 refills | Status: DC

## 2023-01-30 NOTE — Telephone Encounter (Signed)
Requested Prescriptions   Signed Prescriptions Disp Refills   losartan (COZAAR) 50 MG tablet 90 tablet 1    Sig: Take 1 tablet (50 mg total) by mouth in the morning and at bedtime.    Authorizing Provider: Debbe Odea    Ordering User: Guerry Minors

## 2023-02-08 ENCOUNTER — Encounter: Payer: Self-pay | Admitting: Gastroenterology

## 2023-04-30 ENCOUNTER — Other Ambulatory Visit: Payer: Self-pay

## 2023-04-30 MED ORDER — LOSARTAN POTASSIUM 50 MG PO TABS: 50.0000 mg | ORAL_TABLET | Freq: Two times a day (BID) | ORAL | 0 refills | Status: DC

## 2023-05-01 ENCOUNTER — Other Ambulatory Visit: Payer: Self-pay

## 2023-05-01 MED ORDER — LOSARTAN POTASSIUM 50 MG PO TABS: 50.0000 mg | ORAL_TABLET | Freq: Two times a day (BID) | ORAL | 0 refills | Status: DC

## 2023-05-27 ENCOUNTER — Other Ambulatory Visit: Payer: Self-pay

## 2023-05-27 DIAGNOSIS — N401 Enlarged prostate with lower urinary tract symptoms: Secondary | ICD-10-CM

## 2023-05-30 ENCOUNTER — Emergency Department
Admission: EM | Admit: 2023-05-30 | Discharge: 2023-05-30 | Disposition: A | Discharge status: Home / Self Care | Payer: BC Managed Care – PPO | Attending: Emergency Medicine | Admitting: Emergency Medicine

## 2023-05-30 ENCOUNTER — Telehealth: Payer: Self-pay | Admitting: Cardiology

## 2023-05-30 ENCOUNTER — Emergency Department: Payer: BC Managed Care – PPO

## 2023-05-30 ENCOUNTER — Ambulatory Visit: Payer: Self-pay | Admitting: Family Medicine

## 2023-05-30 ENCOUNTER — Other Ambulatory Visit: Payer: Self-pay

## 2023-05-30 DIAGNOSIS — R0789 Other chest pain: Secondary | ICD-10-CM | POA: Diagnosis not present

## 2023-05-30 DIAGNOSIS — I10 Essential (primary) hypertension: Secondary | ICD-10-CM | POA: Insufficient documentation

## 2023-05-30 DIAGNOSIS — I2510 Atherosclerotic heart disease of native coronary artery without angina pectoris: Secondary | ICD-10-CM | POA: Insufficient documentation

## 2023-05-30 DIAGNOSIS — R918 Other nonspecific abnormal finding of lung field: Secondary | ICD-10-CM | POA: Diagnosis not present

## 2023-05-30 DIAGNOSIS — R071 Chest pain on breathing: Secondary | ICD-10-CM | POA: Diagnosis not present

## 2023-05-30 LAB — BASIC METABOLIC PANEL
Anion gap: 11 (ref 5–15)
BUN: 16 mg/dL (ref 8–23)
CO2: 24 mmol/L (ref 22–32)
Calcium: 9.3 mg/dL (ref 8.9–10.3)
Chloride: 103 mmol/L (ref 98–111)
Creatinine, Ser: 1 mg/dL (ref 0.61–1.24)
GFR, Estimated: >60 mL/min (ref >60)
Glucose, Bld: 140 mg/dL — ABNORMAL HIGH (ref 70–99)
Potassium: 3.7 mmol/L (ref 3.5–5.1)
Sodium: 138 mmol/L (ref 135–145)

## 2023-05-30 LAB — CBC
HCT: 42.5 % (ref 39.0–52.0)
Hemoglobin: 14.6 g/dL (ref 13.0–17.0)
MCH: 34 pg (ref 26.0–34.0)
MCHC: 34.4 g/dL (ref 30.0–36.0)
MCV: 98.8 fL (ref 80.0–100.0)
Platelets: 275 10*3/uL (ref 150–400)
RBC: 4.3 MIL/uL (ref 4.22–5.81)
RDW: 12.5 % (ref 11.5–15.5)
WBC: 12 10*3/uL — ABNORMAL HIGH (ref 4.0–10.5)
nRBC: 0 % (ref 0.0–0.2)

## 2023-05-30 LAB — D-DIMER, QUANTITATIVE: D-Dimer, Quant: <0.27 ug{FEU}/mL (ref 0.00–0.50)

## 2023-05-30 LAB — TROPONIN I (HIGH SENSITIVITY)
Troponin I (High Sensitivity): 6 ng/L (ref <18)
Troponin I (High Sensitivity): 5 ng/L (ref <18)

## 2023-05-30 MED ORDER — PREDNISONE 10 MG PO TABS: 40.0000 mg | ORAL_TABLET | Freq: Every day | ORAL | 0 refills | Status: AC

## 2023-05-30 MED ORDER — KETOROLAC TROMETHAMINE 15 MG/ML IJ SOLN
15.0000 mg | Freq: Once | INTRAMUSCULAR | Status: AC
  Administered 2023-05-30: 15 mg via INTRAVENOUS
  Filled 2023-05-30: qty 1

## 2023-05-30 NOTE — Telephone Encounter (Signed)
 Per chart review has call in with Cardiology as well

## 2023-05-30 NOTE — ED Triage Notes (Signed)
 Pt sts that he has been having chest pain with breathing for the last 30 hrs.

## 2023-05-30 NOTE — Telephone Encounter (Signed)
 I called and spoke with Antonio Russell.  Antonio Russell advised that he woke up yesterday morning and developed chest pain that occurs with breathing.  He denies fever/ cough. He denies any strenuous activity/ pushing/ pulling over Antonio last few days.   Antonio Russell denies any increased SOB, but confirms it is hard to take a deep breath due to his pain. He describes pain as right sided chest pain.  I have advised Antonio Russell that his reported symptoms do not appear cardiac in nature.  I have asked that he call his PCP to follow up and see if they feel an evaluation is warranted.  He is aware that if his PCP has concerns that he should follow up with our office to please let us know.   Antonio Russell voices understanding and is agreeable.

## 2023-05-30 NOTE — Telephone Encounter (Signed)
 It looks like (per epic) he is in [facility] now I will watch for correspondence

## 2023-05-30 NOTE — Telephone Encounter (Signed)
 Copied from CRM [id number]. Topic: Clinical - Red Word Triage >> May 30, 2023  8:47 AM Irine Seal wrote: [facility] that prompted transfer to Nurse Triage: Patient presents with pain when attempting to take a deep breath and difficulty breathing, which has been ongoing for the past 2 days. No other symptoms are reported.   Chief Complaint: chest pain x 2 days, worse with deep breaths Symptoms: right side chest pain Frequency: constant Pertinent Negatives: Patient denies nausea or vomiting Disposition: [x] ED /[] Urgent Care (no appt availability in office) / [] Appointment(In office/virtual)/ []  [facility]/ [] Home Care/ [x] Refused Recommended Disposition /[] [facility]/ []  Follow-up with PCP Additional Notes: Patient reports right side chest pain x 2 days. States the pain is there when he breathes in regularly and worse when he takes deep breaths. RN advising ED, pt refusing, wanting to see PCP. Call made to [facility]. RN was advised to route message [facility] so pts chart can be evalauted and someone will follow-up with patient directly. Pt verbalized understanding Reason for Disposition  Taking a deep breath makes pain worse  Answer Assessment - Initial Assessment Questions 1. LOCATION: "Where does it hurt?"       Upper right chest pain  2. RADIATION: "Does the pain go anywhere else?" (e.g., into neck, jaw, arms, back)     No does not radiate  3. ONSET: "When did the chest pain begin?" (Minutes, hours or days)      Yesterday morning  4. PATTERN: "Does the pain come and go, or has it been constant since it started?"  "Does it get worse with exertion?"      Comes and goes when taking deeper breath; hurts when inhaling  5. DURATION: "How long does it last" (e.g., seconds, minutes, hours)     Seconds  6. SEVERITY: "How bad is the pain?"  (e.g., Scale 1-10; mild, moderate, or severe)    - MILD (1-3): doesn't interfere with normal activities     - MODERATE (4-7): interferes with normal  activities or awakens from sleep    - SEVERE (8-10): excruciating pain, unable to do any normal activities       Mild-moderate pain  7. CARDIAC RISK FACTORS: "Do you have any history of heart problems or risk factors for heart disease?" (e.g., angina, prior heart attack; diabetes, high blood pressure, high cholesterol, smoker, or strong family history of heart disease)     CAD, AAA, HTN and PVD  8. PULMONARY RISK FACTORS: "Do you have any history of lung disease?"  (e.g., blood clots in lung, asthma, emphysema, birth control pills)     No PE  9. CAUSE: "What do you think is causing the chest pain?"     Unsure of cause  10. OTHER SYMPTOMS: "Do you have any other symptoms?" (e.g., dizziness, nausea, vomiting, sweating, fever, difficulty breathing, cough)       Dizziness occasionally,  Protocols used: Chest Pain-A-AH

## 2023-05-30 NOTE — ED Provider Notes (Signed)
 [facility] Provider Note    Event Date/Time   First MD Initiated Contact with Patient 05/30/23 1502     (approximate)   History   Chest Pain   HPI Antonio Russell is a 63 y.o. male with history of HTN, CAD, prior PCI in 2022 presenting today for chest pain.  Patient states starting yesterday morning with occasional pain on the right side of his chest when taking a deep breath.  When he holds his breath he does not have any symptoms.  He denies any radiation of the pain and no left-sided chest pain.  Is not changed by exertion.  Did not notice it overnight when he was sleeping.  Otherwise denies cough, congestion, fever, chills, nausea, vomiting, diaphoresis, abdominal pain, leg pain, leg swelling.  Patient states pain symptoms are easing up at this time and has not taken any medication to treat it.     Physical Exam   Triage Vital Signs: ED Triage Vitals  Encounter Vitals Group     BP 05/30/23 1304 (!) 160/94     Systolic BP Percentile --      Diastolic BP Percentile --      Pulse Rate 05/30/23 1304 91     Resp 05/30/23 1304 15     Temp 05/30/23 1304 98.1 F (36.7 C)     Temp Source 05/30/23 1304 Oral     SpO2 05/30/23 1304 100 %     Weight 05/30/23 1303 200 lb (90.7 kg)     Height 05/30/23 1303 6' (1.829 m)     Head Circumference --      Peak Flow --      Pain Score 05/30/23 1303 8     Pain Loc --      Pain Education --      Exclude from Growth Chart --     Most recent vital signs: Vitals:   05/30/23 1445 05/30/23 1600  BP:  130/89  Pulse: 79 75  Resp: 19 14  Temp:    SpO2: 97% 97%   Physical Exam: I have reviewed the vital signs and nursing notes. General: Awake, alert, no acute distress.  Nontoxic appearing. Head:  Atraumatic, normocephalic.   ENT:  EOM intact, PERRL. Oral mucosa is pink and moist with no lesions. Neck: Neck is supple with full range of motion, No meningeal signs. Cardiovascular:  RRR, No murmurs. Peripheral pulses  palpable and equal bilaterally. Respiratory:  Symmetrical chest wall expansion.  No rhonchi, rales, or wheezes.  Good air movement throughout.  No use of accessory muscles.   Musculoskeletal:  No cyanosis or edema. Moving extremities with full ROM Abdomen:  Soft, nontender, nondistended. Neuro:  GCS 15, moving all four extremities, interacting appropriately. Speech clear. Psych:  Calm, appropriate.   Skin:  Warm, dry, no rash.    ED Results / Procedures / Treatments   Labs (all labs ordered are listed, but only abnormal results are displayed) Labs Reviewed  BASIC METABOLIC PANEL - Abnormal; Notable for the following components:      Result Value   Glucose, Bld 140 (*)    All other components within normal limits  CBC - Abnormal; Notable for the following components:   WBC 12.0 (*)    All other components within normal limits  D-DIMER, QUANTITATIVE  TROPONIN I (HIGH SENSITIVITY)  TROPONIN I (HIGH SENSITIVITY)     EKG My EKG interpretation: Rate of 71, normal sinus rhythm, normal axis, normal intervals.  No acute  ST elevations or depressions.  New T wave inversions seen in aVL which was not present in EKG from 05/18/2022.   RADIOLOGY Independently interpreted chest x-ray with chronic perihilar and lower lung interstitial thickening likely in related to chronic bronchitic changes.  No other acute consolidations or pneumothorax   PROCEDURES:  Critical Care performed: No  Procedures   MEDICATIONS ORDERED IN ED: Medications  ketorolac (TORADOL) 15 MG/ML injection 15 mg (15 mg Intravenous Given 05/30/23 1624)     IMPRESSION / MDM / ASSESSMENT AND PLAN / ED COURSE  I reviewed the triage vital signs and the nursing notes.                              Differential diagnosis includes, but is not limited to, pneumonia, pneumothorax, atypical pneumonia, PE, lower suspicion ACS, pleurisy  Patient's presentation is most consistent with acute complicated illness / injury  requiring diagnostic workup.  Patient is a 63 year old male presenting today for right-sided chest pain when taking deep breaths.  Physical exam entirely unremarkable and patient states pain symptoms are minimal at this time.  Vital signs are stable.  EKG with only 1 slight T wave inversion in aVL which was not seen in the past but otherwise no acute ischemic changes.  CBC with slight leukocytosis and BMP otherwise normal.  Troponin negative x 2.  D-dimer negative.  Chest x-ray with bronchitic changes present but no other obvious pneumonia.  Patient was given Toradol with reassessment he was asymptomatic.  While he does have intermittent right-sided chest pain, he does not have any other associated features concerning for ACS at this time.  Will start on prednisone for 5 days for possible bronchitis and have him follow-up with his PCP and cardiology teams outpatient.  He was given strict return precautions and agreeable with plan.  The patient is on the cardiac monitor to evaluate for evidence of arrhythmia and/or significant heart rate changes. Clinical Course as of 05/30/23 1656  Thu May 30, 2023  1655 Reassessed patient.  Feeling well at this time with no ongoing symptoms. [DW]    Clinical Course User Index [DW] Janith Lima, MD     FINAL CLINICAL IMPRESSION(S) / ED DIAGNOSES   Final diagnoses:  Atypical chest pain     Rx / DC Orders   ED Discharge Orders          Ordered    predniSONE (DELTASONE) 10 MG tablet  Daily        05/30/23 1656             Note:  This document was prepared using Dragon voice recognition software and may include unintentional dictation errors.   Janith Lima, MD 05/30/23 [phone]

## 2023-05-30 NOTE — ED Notes (Signed)
 Pt verbalizes understanding of discharge instructions. Opportunity for questioning and answers were provided. Pt discharged from ED to home.   ? ?

## 2023-05-30 NOTE — Discharge Instructions (Signed)
 Chest x-ray showed concern for possible bronchitis today but otherwise your labs looking at your heart were completely normal and no signs of blood clots.  I have sent steroids to your pharmacy for you to take to help with symptoms over the next 5 days.  Please follow-up with your cardiology team for reassessment.  Please return for any severe worsening symptoms.

## 2023-05-30 NOTE — Telephone Encounter (Signed)
   Pt c/o of Chest Pain: STAT if active CP, including tightness, pressure, jaw pain, radiating pain to shoulder/upper arm/back, CP unrelieved by Nitro. Symptoms reported of SOB, nausea, vomiting, sweating.  1. Are you having CP right now? yes    2. Are you experiencing any other symptoms (ex. SOB, nausea, vomiting, sweating)? Hurts if trying to take a deep breathe   3. Is your CP continuous or coming and going? continuous   4. Have you taken Nitroglycerin? No    5. How long have you been experiencing CP? The past 2 days/since Wednesday morning    6. If NO CP at time of call then end call with telling Pt to call back or call 911 if Chest pain returns prior to return call from triage team.

## 2023-05-31 ENCOUNTER — Other Ambulatory Visit: Payer: BC Managed Care – PPO

## 2023-05-31 ENCOUNTER — Ambulatory Visit (INDEPENDENT_AMBULATORY_CARE_PROVIDER_SITE_OTHER): Payer: BC Managed Care – PPO | Admitting: Internal Medicine

## 2023-05-31 ENCOUNTER — Encounter: Payer: Self-pay | Admitting: Internal Medicine

## 2023-05-31 VITALS — BP 124/80 | HR 87 | Ht 72.0 in | Wt 205.8 lb

## 2023-05-31 DIAGNOSIS — E042 Nontoxic multinodular goiter: Secondary | ICD-10-CM

## 2023-05-31 DIAGNOSIS — R7303 Prediabetes: Secondary | ICD-10-CM

## 2023-05-31 DIAGNOSIS — N401 Enlarged prostate with lower urinary tract symptoms: Secondary | ICD-10-CM | POA: Diagnosis not present

## 2023-05-31 DIAGNOSIS — N138 Other obstructive and reflux uropathy: Secondary | ICD-10-CM

## 2023-05-31 DIAGNOSIS — E210 Primary hyperparathyroidism: Secondary | ICD-10-CM | POA: Diagnosis not present

## 2023-05-31 DIAGNOSIS — E559 Vitamin D deficiency, unspecified: Secondary | ICD-10-CM

## 2023-05-31 NOTE — Patient Instructions (Addendum)
 Please stop at the lab.  Will schedule another U/S and then will likely need biopsies.  Please return for another visit in 1 year.

## 2023-05-31 NOTE — Progress Notes (Addendum)
 Patient ID: Antonio Russell, male   DOB: [birth year], 63 y.o.   MRN: [id number]   HPI  Antonio Russell is a 63 y.o.-year-old male, initially referred by his PCP, Dr. Milinda Antis, returning for follow-up for hypercalcemia/hyperparathyroidism and vitamin D deficiency.  Last visit 1 year ago.  Interim history: After his parathyroid surgery in 02/2020, he started to feel better and had no more kidney stones. He was in the ED with CP with deep breaths yesterday - he r/o for ACS, PE. Assumed 2/2 bronchitis (CXR) - on Prednisone - did not start yet pending our evaluation today.  Reviewed and addended history: She has had hypercalcemia since at least 08/2019.  Reviewed pertinent labs: Lab Results  Component Value Date   PTH 35 05/19/2021   PTH Comment 05/19/2021   PTH 39 05/16/2020   PTH Comment 05/16/2020   PTH 60 06/12/2019   PTH 133 (H) 05/01/2019   PTH 137 (H) 01/13/2019   CALCIUM 9.3 05/30/2023   CALCIUM 9.8 05/25/2022   CALCIUM 9.3 05/19/2021   CALCIUM 9.2 08/18/2020   CALCIUM 9.0 06/15/2020   CALCIUM 9.0 06/13/2020   CALCIUM 9.5 05/16/2020   CALCIUM 9.4 05/16/2020   CALCIUM 9.5 02/13/2020   CALCIUM 10.7 (H) 02/09/2020  Postop calcium: 9.5, PTH 28  No history of osteoporosis/fracture/falls.  Phosphorus was low:  Lab Results  Component Value Date   PHOS 2.1 (L) 06/12/2019   PHOS 3.0 05/01/2019    Further investigation for hyperparathyroidism: Component     Latest Ref Rng & Units 06/12/2019          Vitamin D 1, 25 (OH) Total     18 - 72 pg/mL 120 (H)  Vitamin D3 1, 25 (OH)     pg/mL 68  Vitamin D2 1, 25 (OH)     pg/mL 52  Magnesium     1.5 - 2.5 mg/dL 2.2  Creatinine, 78G Ur     0.50 - 2.15 g/24 h 1.56  Calcium, 24H Urine     mg/24 h 401 (H)  FECa = 0.02%  After the above labs returned, I referred him to surgery.  He had the following investigations: Technetium sestamibi scan (10/14/2019): Right lower pole possible parathyroid adenoma  Thyroid ultrasound (10/14/2019):  Small thyroid nodules: Parenchymal Echotexture: Normal Isthmus: Normal in size measuring 0.4 cm in diameter Right lobe: Normal in size measuring 4.8 x 2.0 x 1.6 cm Left lobe: Normal in size measuring 4.3 x 1.9 x 1.6 cm _________________________________________________________   Nodule # 1: Location: Right; Mid Maximum size: 1.2 cm; Other 2 dimensions: 1.2 x 0.8 cm Composition: solid/almost completely solid (2) Echogenicity: hypoechoic (2)  *Given size (>/= 1 - 1.4 cm) and appearance, a follow-up ultrasound in 1 year should be considered based on TI-RADS criteria. _________________________________________________________   Nodule # 2: Location: Right; Inferior  Maximum size: 1.0 cm; Other 2 dimensions: 0.8 x 0.8 cm Composition: solid/almost completely solid (2) Echogenicity: hypoechoic (2)  *Given size (>/= 1 - 1.4 cm) and appearance, a follow-up ultrasound in 1 year should be considered based on TI-RADS criteria. _________________________________________________________   There are 2 punctate, approximately 0.8 cm) hypoechoic nodules within the mid (labeled 3) and inferior (labeled 4) aspects the left lobe of the thyroid, neither of which meet criteria to recommend percutaneous sampling or continued dedicated follow-up.   No definitive extra thyroidal nodules are identified to suggest the presence of a parathyroid adenoma.   IMPRESSION: 1. Findings suggestive of multinodular goiter. 2. Nodules #1 and #  2 both meet imaging criteria to recommend a 1 year follow-up as indicated. 3. No definitive extra thyroidal nodules to suggest the presence of a parathyroid adenoma.  4D CT (11/06/2019): Carotid stenosis but no convincing parathyroid lesion.   Since then, he saw vascular surgery on 12/25/2019 >> mild >> he was cleared for surgery.  She did not have the surgery yet.  She sees vascular again next months.  He had right inferior parathyroid exploratory parathyroidectomy with Dr.  Gerrit Friends on 02/12/2020. Pathology showed a 1.1 cm, 0.464 g hypercellular mass, consistent with adenoma.  Calcium decreased appropriately postop.  Thyroid U/S (06/16/2021): Parenchymal Echotexture: Normal  Isthmus: 0.3 cm  Right lobe: 5.4 x 1.6 x 1.6 cm  Left lobe: 4.6 x 1.8 x 1.7 cm  _________________________________________________________   Estimated total number of nodules >/= 1 cm: 5 _________________________________________________________   Nodule # 1:  Location: RIGHT; Superior  Maximum size: 1.4 cm; Other 2 dimensions: 1.4 x 0.8 cm  Composition: solid/almost completely solid (2)  Echogenicity: hypoechoic (2)  *Given size (>/= 1 - 1.4 cm) and appearance, a follow-up ultrasound in 1 year should be considered based on TI-RADS criteria.  _________________________________________________________   Nodule # 2:  Location: RIGHT; Inferior  Maximum size: 1.0 cm; Other 2 dimensions: 1.0 x 0.8 cm  Composition: solid/almost completely solid (2)  Echogenicity: hypoechoic (2)  Shape: taller-than-wide (3)  ACR TI-RADS total points: 7.  **Given size (>/= 1.0 cm) and appearance, fine needle aspiration of this highly suspicious nodule should be considered based on TI-RADS criteria.  ________________________________________________________   Additional sub-1 cm #3 TR-3, #4 TR-4 and #5 TR-4 do not meet threshold for follow-up nor biopsy.   No cervical adenopathy or abnormal fluid collection within the imaged neck.   IMPRESSION: 1. Multinodular thyroid gland. 2. 1.0 cm RIGHT inferior TR-5 thyroid nodule. Fine needle aspiration of this highly suspicious nodule should be considered based on TI-RADS criteria. 3. 1.4 cm RIGHT superior TR-4 thyroid nodule. A follow-up ultrasound in 1 year should be considered based on TI-RADS criteria.      I suggested a biopsy of the right inferior thyroid nodule after the above results returned but he did not go through with this.  Thyroid U/S  (06/29/2022): arenchymal Echotexture: Mildly heterogenous  Isthmus: Normal in size measuring 0.3 cm in diameter, unchanged  Right lobe: Borderline enlarged measuring 5.6 x 1.8 x 1.8 cm, previously, 5.4 x 1.6 x 1.6 cm Left lobe: Normal in size measuring 4.6 x 2.1 x 1.8 cm, previously, 4.6 x 1.8 x 1.7 cm   _________________________________________________________   Estimated total number of nodules >/= 1 cm: 3 _________________________________________________________   Nodule # 1:  Prior biopsy: No  Location: Right; Mid  Maximum size: 1.7 cm; Other 2 dimensions: 1.7 x 0.9 cm, previously, 1.4 x 1.4 x 0.8 cm  Composition: solid/almost completely solid (2)  Echogenicity: hypoechoic (2) ACR TI-RADS risk category:  TR4 (4-6 points). Significant change in size (>/= 20% in two dimensions and minimal increase of 2 mm): Yes **Given size (>/= 1.5 cm) and appearance, fine needle aspiration of this moderately suspicious nodule should be considered based on TI-RADS criteria. _________________________________________________________   Nodule # 2:  Prior biopsy: No  Location: Right; Inferior  Maximum size: 1.1 cm; Other 2 dimensions: 1.0 x 0.8 cm, previously, 1.0 x 1.0 x 0.8 cm  Composition: solid/almost completely solid (2)  Echogenicity: hypoechoic (2)  Shape: taller-than-wide (3) ACR TI-RADS risk category:  TR5 (>/= 7 points). **Given size (>/= 1.0  cm) and appearance, fine needle aspiration of this highly suspicious nodule should be considered based on TI-RADS criteria.  _________________________________________________________   Nodule # 3:  Prior biopsy: No  Location: Left; Mid  Maximum size: 1.1 cm; Other 2 dimensions: 1.0 x 0.5 cm, previously, 1.0 x 0.9 x 0.6 cm (when compared to the 05/2021 examination, previously, 0.8 x 0.8 x 0.4 cm when compared to the 10/2019 exam.  Composition: solid/almost completely solid (2)  Echogenicity: hypoechoic (2) ACR TI-RADS risk category:  TR4 (4-6  points). *Given size (>/= 1 - 1.4 cm) and appearance, a follow-up ultrasound in 1 year should be considered based on TI-RADS criteria. _________________________________________________________   The approximately 0.9 x 0.7 x 0.5 cm hypoechoic nodule within the inferior pole of the left lobe of the thyroid (labeled 4) is unchanged compared to the 10/2019 examination, previously, 0.7 cm, and again does not meet criteria to recommend percutaneous sampling or continued dedicated follow-up.   IMPRESSION: 1. Similar findings of multinodular goiter. 2. Nodules labeled #1 and #2 both again meet imaging criteria to recommend percutaneous sampling as indicated. Note, nodule labeled #1 has demonstrate minimal interval growth (approximately 3 mm). 3. Nodule labeled #3 has demonstrated slow interval growth since the 10/2019 examination and now meets imaging criteria to recommend annual/biannual follow-up as indicated.  The above results returned, I again recommended biopsy, but he did not go through with this.  Latest BUN/Cr: Lab Results  Component Value Date   BUN 16 05/30/2023   BUN 18 08/18/2020   CREATININE 1.00 05/30/2023   CREATININE 1.09 08/18/2020   He has a history of vitamin D deficiency: Lab Results  Component Value Date   VD25OH 37.28 05/25/2022   VD25OH 33.22 05/19/2021   VD25OH 62.37 05/16/2020   VD25OH 29.1 (L) 01/05/2020   VD25OH 27.43 (L) 06/05/2019   VD25OH 19.52 (L) 05/01/2019   VD25OH 15.29 (L) 01/13/2019   He is currently on 5000 units vitamin D daily along with a multivitamin.  No family history of hypercalcemia or pituitary tumors.  He has a half sister with thyroid cancer.  Latest TSH was normal: Lab Results  Component Value Date   TSH 1.33 05/25/2022   Pt. also has a history of L posterior thigh muscles pain and weakness 4 years ago >> now bilateral >> also anterior aspects of thighs. He also has a h/o chest pain - s/p catheterization, HTN, HL - on  Praluent, high WBC. Metoprolol was changed to Losartan 2/2 ED. He has a history of prediabetes: Lab Results  Component Value Date   HGBA1C 5.8 (A) 05/25/2022   HGBA1C 6.1 05/01/2019   HGBA1C 6.3 01/13/2019   HGBA1C (H) 07/05/2010    5.7 (NOTE)                                                                       According to the ADA Clinical Practice Recommendations for 2011, when HbA1c is used as a screening test:   >=6.5%   Diagnostic of Diabetes Mellitus           (if abnormal result  is confirmed)  5.7-6.4%   Increased risk of developing Diabetes Mellitus  References:Diagnosis and Classification of Diabetes Mellitus,Diabetes Care,2011,34(Suppl 1):S62-S69 and Standards of Medical Care  in         Diabetes - 2011,Diabetes Care,2011,34  (Suppl 1):S11-S61.   ROS: + See HPI  I reviewed pt's medications, allergies, PMH, social hx, family hx, and changes were documented in the history of present illness. Otherwise, unchanged from my initial visit note.  Past Medical History:  Diagnosis Date   Allergic rhinitis    Allergy    Anxiety    Arthritis    Back pain    CAD (coronary artery disease)    a. 2012 Cath: nonobstructive, nl LV fxn, rec aggressive med management; b. 05/2020 Cath/PCI: LM nl, LAD 49m, RI mod diff dzs, LCX 65p/m, OM3 20, RCA 30p, 42m (2.5x12 Resolute Onyx DES).   GERD (gastroesophageal reflux disease)    Headache(784.0)    History of kidney stones    HLD (hyperlipidemia)    Hyperparathyroidism (HCC)    Hypertension    Impotence of organic origin    Other testicular hypofunction    Peripheral vascular disease (HCC)    Personal history of urinary calculi    Sleep apnea    no c-pap or bi-pap   Thyroid disease    Hyperparathyroidism   Tobacco use disorder    Unspecified hearing loss    Past Surgical History:  Procedure Laterality Date   CARDIAC CATHETERIZATION     COLONOSCOPY     CORONARY STENT INTERVENTION N/A 06/14/2020   Procedure: CORONARY STENT INTERVENTION;   Surgeon: Yvonne Kendall, MD;  Location: [facility];  Service: Cardiovascular;  Laterality: N/A;   ENDOVASCULAR REPAIR/STENT GRAFT N/A 08/12/2019   Procedure: ENDOVASCULAR REPAIR/STENT GRAFT;  Surgeon: Annice Needy, MD;  Location: [facility];  Service: Cardiovascular;  Laterality: N/A;   EXTRACORPOREAL SHOCK WAVE LITHOTRIPSY Right 10/29/2019   Procedure: EXTRACORPOREAL SHOCK WAVE LITHOTRIPSY (ESWL);  Surgeon: Sondra Come, MD;  Location: [facility];  Service: Urology;  Laterality: Right;   HEMORRHOID SURGERY     HEMORRHOID SURGERY     KNEE SURGERY Right 2013   LEFT HEART CATH AND CORONARY ANGIOGRAPHY Left 06/14/2020   Procedure: LEFT HEART CATH AND CORONARY ANGIOGRAPHY;  Surgeon: Yvonne Kendall, MD;  Location: [facility];  Service: Cardiovascular;  Laterality: Left;   NASAL SEPTUM SURGERY     PARATHYROID EXPLORATION N/A 02/12/2020   Procedure: PARATHYROID EXPLORATION WITH PARATHROIDECTOMY;  Surgeon: Darnell Level, MD;  Location: [facility];  Service: General;  Laterality: N/A;   Social History   Socioeconomic History   Marital status: Married    Spouse name: Not on file   Number of children: 3   Years of education: Not on file   Highest education level: Not on file  Occupational History    Location manager  Tobacco Use   Smoking status: Current Every Day Smoker    Packs/day: 0.50    Years: 35.00    Pack years: 17.50    Types: Cigarettes   Smokeless tobacco: Former Neurosurgeon  Substance and Sexual Activity   Alcohol use: Yes    Alcohol/week:  3-4 beers 5 days a week       Drug use: No   Sexual activity: Not on file  Other Topics Concern   Not on file  Social History Narrative   Coaches basketball   Social Determinants of Health   Financial Resource Strain:    Difficulty of Paying Living Expenses: Not on file  Food Insecurity:    Worried About Running Out of Food in the Last Year: Not on file  Ran Out of Food in the Last Year: Not on file   Transportation Needs:    Lack of Transportation (Medical): Not on file   Lack of Transportation (Non-Medical): Not on file  Physical Activity:    Days of Exercise per Week: Not on file   Minutes of Exercise per Session: Not on file  Stress:    Feeling of Stress : Not on file  Social Connections:    Frequency of Communication with Friends and Family: Not on file   Frequency of Social Gatherings with Friends and Family: Not on file   Attends Religious Services: Not on file   Active Member of Clubs or Organizations: Not on file   Attends Banker Meetings: Not on file   Marital Status: Not on file  Intimate Partner Violence:    Fear of Current or Ex-Partner: Not on file   Emotionally Abused: Not on file   Physically Abused: Not on file   Sexually Abused: Not on file   Current Outpatient Medications on File Prior to Visit  Medication Sig Dispense Refill   aspirin EC 81 MG tablet Take 1 tablet (81 mg total) by mouth daily. Swallow whole. 90 tablet 3   Cholecalciferol (VITAMIN D3) 50 MCG (2000 UT) CAPS Take by mouth.     fexofenadine (ALLEGRA) 180 MG tablet Take 180 mg by mouth daily.      fluticasone (FLONASE) 50 MCG/ACT nasal spray SPRAY 2 SPRAYS INTO EACH NOSTRIL EVERY DAY 16 g 5   losartan (COZAAR) 50 MG tablet Take 1 tablet (50 mg total) by mouth in the morning and at bedtime. 180 tablet 0   predniSONE (DELTASONE) 10 MG tablet Take 4 tablets (40 mg total) by mouth daily for 5 days. 20 tablet 0   REPATHA SURECLICK 140 MG/ML SOAJ Inject 140 mg into the skin every 14 (fourteen) days. 2 mL 6   tadalafil (CIALIS) 5 MG tablet Take 1 tablet (5 mg total) by mouth daily. 30 tablet 11   No current facility-administered medications on file prior to visit.   Allergies  Allergen Reactions   Sertraline Hcl Other (See Comments)    Tremors   Amlodipine Other (See Comments)    Sexual side effects   Crestor [Rosuvastatin Calcium] Other (See Comments)    Myalgia   Lipitor  [Atorvastatin Calcium] Other (See Comments)    Myalgia   Zocor [Simvastatin - High Dose] Other (See Comments)    Myalgia    Family History  Problem Relation Age of Onset   Hypertension Mother    Hyperlipidemia Mother    Diabetes Mother    Hypertension Father    Hyperlipidemia Father    Cancer Father        throat   Hypertension Sister    Esophageal cancer Paternal Uncle    Colon cancer Other        dx "close to 20"   Lung cancer Other        GF   Heart disease Other        GM   Stomach cancer Neg Hx    Rectal cancer Neg Hx    PE: BP 124/80   Pulse 87   Ht 6' (1.829 m)   Wt 205 lb 12.8 oz (93.4 kg)   SpO2 97%   BMI 27.91 kg/m  Wt Readings from Last 3 Encounters:  05/31/23 205 lb 12.8 oz (93.4 kg)  05/30/23 200 lb (90.7 kg)  08/17/22 198 lb 6.4 oz (90 kg)  Constitutional: overweight, in NAD Eyes:  EOMI, no exophthalmos ENT: no neck masses palpated, but neck fullness bilaterally, surgical scar healed, no cervical lymphadenopathy Cardiovascular: RRR, No MRG Respiratory: CTA B Musculoskeletal: no deformities Skin: no rashes Neurological: no tremor with outstretched hands  Assessment: 1.  History of hypercalcemia/hyperparathyroidism  2.  Vitamin D deficiency  3.  Thyroid nodules  4. Prediabetes  Plan: Patient has had several instances of elevated calcium, with the highest level being at 11.9.  An intact PTH level was also high, at 137 for calcium of 11.1-11.5.  -He has a history of kidney stones and had to have ESWL.  No known history of osteoporosis or fractures. -Investigation for primary hyperparathyroidism was positive. He had a high calcium, high PTH, a high 24-hour urine calcium with a  fractional excretion of calcium higher than 0.01, low phosphorus, normal magnesium and high calcitriol. I referred him to Dr. Gerrit Friends with surgery.  He had a technetium sestamibi scan which showed a possible right lower pole adenoma.  Of note, a thyroid ultrasound and the 4D  CT did not show an adenoma. He had parathyroid exploratory surgery on 02/12/2020 and a right inferior parathyroid gland was resected.  Pathology showed a 1.1 cm, 0.464 g, hypercellular mass consistent with adenoma.  His calcium decreased from 10.7 preop to 9.5. PTH decreased from 133 to 28.  PTH and calcium levels remained normal afterwards -No more kidney stones since then -Latest calcium level was normal yesterday: Lab Results  Component Value Date   CALCIUM 9.3 05/30/2023  -We will not repeat his calcium today, only his vitamin D   2.  Vitamin D deficiency -He has a history of vitamin D deficiency with a level of 15 in 01/2019, when he was started on 50,000 units ergocalciferol weekly.  Then on vitamin D3 5000 units daily.  At last visit, however, he was only on a multivitamin, which he continues now.  He added back 5000 units vitamin D daily. -At last visit, vitamin D level was normal: Lab Results  Component Value Date   VD25OH 37.28 05/25/2022  -We will recheck the vitamin D level today  3.  Thyroid nodules -No neck compression symptoms -On the thyroid ultrasound from 05/2021, he had a right inferior thyroid nodule which appeared to be highly suspicious.  Biopsy was recommended.  I sent him a message about this to let me know if he agreed to have the biopsy but he did not reply and he mentions that he was afraid of going through with this.  However, at last visit he excepted to undergo a biopsy if needed.  We checked another thyroid ultrasound on 06/29/2022 which documented the need for biopsy for 2 of his thyroid nodules and the need for 1 year follow-up for one of them.  I sent him a message again through MyChart about having these nodules biopsied but he did not reply... -At today's visit we discussed about the risk for thyroid cancer and the fact that if we wait longer, we may catch the cancer in a more advanced stage, definitely more difficult to treat.  At today's visit, he agrees to  have the biopsies, but since it has been a year since the last ultrasound, we will check this first. -Latest TSH was normal: Lab Results  Component Value Date   TSH 1.33 05/25/2022  -Will recheck his TSH today  4. Prediabetes -He has a history of prediabetes.  At last visit, HbA1c was better: Lab Results  Component  Value Date   HGBA1C 5.8 (A) 05/25/2022   HGBA1C 6.1 05/01/2019   HGBA1C 6.3 01/13/2019   HGBA1C (H) 07/05/2010    5.7 (NOTE)                                                                       According to the ADA Clinical Practice Recommendations for 2011, when HbA1c is used as a screening test:   >=6.5%   Diagnostic of Diabetes Mellitus           (if abnormal result  is confirmed)  5.7-6.4%   Increased risk of developing Diabetes Mellitus  References:Diagnosis and Classification of Diabetes Mellitus,Diabetes Care,2011,34(Suppl 1):S62-S69 and Standards of Medical Care in         Diabetes - 2011,Diabetes Care,2011,34  (Suppl 1):S11-S61.  -At today's visit we will repeat his HbA1c  Orders Placed This Encounter  Procedures   US THYROID   TSH   Hemoglobin A1c   Vitamin D, 25-hydroxy  Labs were ordered to [facility] per his preference.  Component     Latest Ref Rng 05/31/2023  TSH     0.450 - 4.500 uIU/mL 1.010   Hemoglobin A1C     4.8 - 5.6 % 6.1 (H)   Vitamin D, 25-Hydroxy     30.0 - 100.0 ng/mL 35.8   Est. average glucose Bld gHb Est-mCnc     mg/dL 401   TSH and vitamin D at goal.  Thyroid U/S (06/07/2023): Parenchymal Echotexture: Normal  Isthmus: 0.5 cm  Right lobe: 5.1 x 2.1 x 1.5 cm  Left lobe: 4.9 x 2.1 x 1.7 cm  _________________________________________________________   Estimated total number of nodules >/= 1 cm: 4 _________________________________________________________   Nodule # 1: Solid hypoechoic nodule in the right mid gland measures 1.8 x 1.6 x 1.0 cm, insignificantly changed compared to the most recent prior imaging. However, this nodule  has slowly enlarged compared to 1.2 x 1.2 x 0.8 cm in 2021. Findings are consistent with TI-RADS category 4. **Given size (>/= 1.5 cm) and appearance, fine needle aspiration of this moderately suspicious nodule should be considered based on TI-RADS criteria.   Nodule # 2: Solid hypoechoic nodule in the right mid to lower gland remains unchanged at 1.1 x 1.0 x 1.0 cm. This nodule demonstrates no significant interval change dating back to July of 2021. findings are consistent with TI-RADS category 4. *Given size (>/= 1 - 1.4 cm) and appearance, a follow-up ultrasound in 1 year should be considered based on TI-RADS criteria.   Nodule # 3: Solid isoechoic nodule in the left mid to upper gland measures up to 1.3 cm. Findings are consistent with TI-RADS category 3. Given size (<1.4 cm) and appearance, this nodule does NOT meet TI-RADS criteria for biopsy or dedicated follow-up.   Nodule # 4: Isoechoic solid nodule in the left mid gland measures approximately 1.2 x 1.1 x 0.7 cm compared to 1.1 x 1.0 x 0.5 cm previously. Findings are consistent with TI-RADS category 4. This lesion is slowly enlarging compared to prior imaging from 2021. *Given size (>/= 1 - 1.4 cm) and appearance, a follow-up ultrasound in 1 year should be considered based on TI-RADS criteria.   IMPRESSION: 1. Nodule # 1 is a  1.8 cm TI-RADS category 4 nodule in the right mid gland and continues to meet criteria to consider fine-needle aspiration biopsy. While there is no significant interval change compared to the most recent prior imaging from 1 year ago, this lesion does demonstrate slow interval growth when compared to more remote imaging from 2021. 2. Nodule # 2 is a 1.1 cm TI-RADS category 4 nodule in the right mid to lower gland demonstrates no significant interval change dating back to July of 2021. This exam confirms nearly 4 years of stability. Lesion continues to meet criteria for ultrasound surveillance.  Recommend follow-up ultrasound in 1 year to confirm 5 years of stability and thus benignity. 3. Nodule # 4 is a slowly enlarging 1.2 cm TI-RADS category 4 nodule in the left mid gland and meets criteria for imaging surveillance. Recommend follow-up ultrasound in 1 year.  Will check with patient if we can do an FNA of the right 1.8 cm thyroid nodule.  Carlus Pavlov, MD PhD [facility]

## 2023-06-01 LAB — TSH: TSH: 1.01 u[IU]/mL (ref 0.450–4.500)

## 2023-06-01 LAB — HEMOGLOBIN A1C
Est. average glucose Bld gHb Est-mCnc: 128 mg/dL
Hgb A1c MFr Bld: 6.1 % — ABNORMAL HIGH (ref 4.8–5.6)

## 2023-06-01 LAB — VITAMIN D 25 HYDROXY (VIT D DEFICIENCY, FRACTURES): Vit D, 25-Hydroxy: 35.8 ng/mL (ref 30.0–100.0)

## 2023-06-01 LAB — PSA: Prostate Specific Ag, Serum: 0.7 ng/mL (ref 0.0–4.0)

## 2023-06-03 ENCOUNTER — Encounter: Payer: Self-pay | Admitting: Internal Medicine

## 2023-06-05 NOTE — Progress Notes (Signed)
 06/07/2023 9:25 AM   Antonio Russell Jan 11, 1961 [id number]  Referring provider: Judy Pimple, MD [address] [place],  [place] [address]  Urological history: 1. High risk hematuria - former-smoker - CT angio abd/pelvis 09/2019 1.4 cm low-attenuation left adrenal nodule, previously 1.2 cm, presumably benign adenoma. Right adrenal unremarkable. Stable 1.2 cm cyst from the lower pole right kidney.  No hydronephrosis. Urinary bladder incompletely distended.  Mild prostatic enlargement with central coarse calcifications. - cysto 09/2019 Prostate moderate lateral lobe enlargement with hypervascularity and friability - moderate elevation bladder neck  2. Nephrolithiasis - s/p ESWL for 1 cm right UPJ stone - history of hyperparathyroidism - s/p parathyroidectomy 02/2020 - KUB 05/27/2020 no stones seen   3. BPH with LU TS -PSA (05/2023) 0.7  4. ED -contributing factors of age, CAD, HTN, testosterone deficiency, anxiety, HLD and former smoker -Tadalafil 5 mg daily  HPI: Antonio Russell is a 63 y.o. who presents today for yearly follow up.    Previous records reviewed.     I PSS 4/1  UA yellow clear, specific gravity 1.020, pH 6.0, 0-5 WBCs, 0-2 RBCs, 0-10 epithelial cells and a few bacteria.  He has no urinary complaints.  Patient denies any modifying or aggravating factors.  Patient denies any recent UTI's, gross hematuria, dysuria or suprapubic/flank pain.  Patient denies any fevers, chills, nausea or vomiting.     IPSS     Row Name 06/07/23 0900         International Prostate Symptom Score   How often have you had the sensation of not emptying your bladder? Not at All     How often have you had to urinate less than every two hours? Not at All     How often have you found you stopped and started again several times when you urinated? Less than 1 in 5 times     How often have you found it difficult to postpone urination? Not at All     How often have you had a weak  urinary stream? Less than 1 in 5 times     How often have you had to strain to start urination? Not at All     How many times did you typically get up at night to urinate? 2 Times     Total IPSS Score 4       Quality of Life due to urinary symptoms   If you were to spend the rest of your life with your urinary condition just the way it is now how would you feel about that? Pleased             Score:  1-7 Mild 8-19 Moderate 20-35 Severe   SHIM 14  He is not having as robust erections as he would like.  He is taking tadalafil 5 mg daily he is not having spontaneous erections.  He does not have curvature with erections.  He does not have pain with erections.   SHIM     Row Name 06/07/23 0919         SHIM: Over the last 6 months:   How do you rate your confidence that you could get and keep an erection? Low     When you had erections with sexual stimulation, how often were your erections hard enough for penetration (entering your partner)? Sometimes (about half the time)     During sexual intercourse, how often were you able to maintain your erection after you  had penetrated (entered) your partner? Sometimes (about half the time)     During sexual intercourse, how difficult was it to maintain your erection to completion of intercourse? Difficult     When you attempted sexual intercourse, how often was it satisfactory for you? Sometimes (about half the time)       SHIM Total Score   SHIM 14              Score: 1-7 Severe ED 8-11 Moderate ED 12-16 Mild-Moderate ED 17-21 Mild ED 22-25 No ED  PMH: Past Medical History:  Diagnosis Date   Allergic rhinitis    Allergy    Anxiety    Arthritis    Back pain    CAD (coronary artery disease)    a. 2012 Cath: nonobstructive, nl LV fxn, rec aggressive med management; b. 05/2020 Cath/PCI: LM nl, LAD 73m, RI mod diff dzs, LCX 65p/m, OM3 20, RCA 30p, 34m (2.5x12 Resolute Onyx DES).   GERD (gastroesophageal reflux disease)     Headache(784.0)    History of kidney stones    HLD (hyperlipidemia)    Hyperparathyroidism (HCC)    Hypertension    Impotence of organic origin    Other testicular hypofunction    Peripheral vascular disease (HCC)    Personal history of urinary calculi    Sleep apnea    no c-pap or bi-pap   Thyroid disease    Hyperparathyroidism   Tobacco use disorder    Unspecified hearing loss     Surgical History: Past Surgical History:  Procedure Laterality Date   CARDIAC CATHETERIZATION     COLONOSCOPY     CORONARY STENT INTERVENTION N/A 06/14/2020   Procedure: CORONARY STENT INTERVENTION;  Surgeon: Yvonne Kendall, MD;  Location: [facility];  Service: Cardiovascular;  Laterality: N/A;   ENDOVASCULAR REPAIR/STENT GRAFT N/A 08/12/2019   Procedure: ENDOVASCULAR REPAIR/STENT GRAFT;  Surgeon: Annice Needy, MD;  Location: [facility];  Service: Cardiovascular;  Laterality: N/A;   EXTRACORPOREAL SHOCK WAVE LITHOTRIPSY Right 10/29/2019   Procedure: EXTRACORPOREAL SHOCK WAVE LITHOTRIPSY (ESWL);  Surgeon: Sondra Come, MD;  Location: [facility];  Service: Urology;  Laterality: Right;   HEMORRHOID SURGERY     HEMORRHOID SURGERY     KNEE SURGERY Right 2013   LEFT HEART CATH AND CORONARY ANGIOGRAPHY Left 06/14/2020   Procedure: LEFT HEART CATH AND CORONARY ANGIOGRAPHY;  Surgeon: Yvonne Kendall, MD;  Location: [facility];  Service: Cardiovascular;  Laterality: Left;   NASAL SEPTUM SURGERY     PARATHYROID EXPLORATION N/A 02/12/2020   Procedure: PARATHYROID EXPLORATION WITH PARATHROIDECTOMY;  Surgeon: Darnell Level, MD;  Location: [facility];  Service: General;  Laterality: N/A;    Home Medications:  Current Outpatient Medications on File Prior to Visit  Medication Sig Dispense Refill   aspirin EC 81 MG tablet Take 1 tablet (81 mg total) by mouth daily. Swallow whole. 90 tablet 3   Cholecalciferol (VITAMIN D3) 50 MCG (2000 UT) CAPS Take by mouth.     fexofenadine (ALLEGRA)  180 MG tablet Take 180 mg by mouth daily.      fluticasone (FLONASE) 50 MCG/ACT nasal spray SPRAY 2 SPRAYS INTO EACH NOSTRIL EVERY DAY 16 g 5   losartan (COZAAR) 50 MG tablet Take 1 tablet (50 mg total) by mouth in the morning and at bedtime. 180 tablet 0   REPATHA SURECLICK 140 MG/ML SOAJ Inject 140 mg into the skin every 14 (fourteen) days. 2 mL 6   No  current facility-administered medications on file prior to visit.    Allergies:  Allergies  Allergen Reactions   Sertraline Hcl Other (See Comments)    Tremors   Amlodipine Other (See Comments)    Sexual side effects   Crestor [Rosuvastatin Calcium] Other (See Comments)    Myalgia   Lipitor [Atorvastatin Calcium] Other (See Comments)    Myalgia   Zocor [Simvastatin - High Dose] Other (See Comments)    Myalgia     Family History: Family History  Problem Relation Age of Onset   Hypertension Mother    Hyperlipidemia Mother    Diabetes Mother    Hypertension Father    Hyperlipidemia Father    Cancer Father        throat   Hypertension Sister    Esophageal cancer Paternal Uncle    Colon cancer Other        dx "close to 26"   Lung cancer Other        GF   Heart disease Other        GM   Stomach cancer Neg Hx    Rectal cancer Neg Hx     Social History:  reports that he quit smoking about 3 years ago. His smoking use included cigarettes. He started smoking about 38 years ago. He has a 8.8 pack-year smoking history. He has been exposed to tobacco smoke. He has quit using smokeless tobacco.  His smokeless tobacco use included chew and snuff. He reports current alcohol use of about 4.0 - 5.0 standard drinks of alcohol per week. He reports that he does not use drugs.  ROS: Pertinent ROS in HPI  Physical Exam: BP (!) 155/98   Pulse 83   Constitutional:  Well nourished. Alert and oriented, No acute distress. HEENT: [place] AT, moist mucus membranes.  Trachea midline Cardiovascular: No clubbing, cyanosis, or edema. Respiratory:  Normal respiratory effort, no increased work of breathing. Neurologic: Grossly intact, no focal deficits, moving all 4 extremities. Psychiatric: Normal mood and affect.   Laboratory Data: Results for orders placed or performed in visit on 05/31/23  PSA   Collection Time: 05/31/23  9:03 AM  Result Value Ref Range   Prostate Specific Ag, Serum 0.7 0.0 - 4.0 ng/mL    Component     Latest Ref Rng 05/31/2023  Hemoglobin A1C     4.8 - 5.6 % 6.1 (H)   Est. average glucose Bld gHb Est-mCnc     mg/dL 161     Legend: (H) High  Component     Latest Ref Rng 05/31/2023  TSH     0.450 - 4.500 uIU/mL 1.010    CBC    Component Value Date/Time   WBC 12.0 (H) 05/30/2023 1304   RBC 4.30 05/30/2023 1304   HGB 14.6 05/30/2023 1304   HGB 16.3 12/29/2014 1421   HCT 42.5 05/30/2023 1304   HCT 48.7 12/29/2014 1421   PLT 275 05/30/2023 1304   PLT 256 12/29/2014 1421   MCV 98.8 05/30/2023 1304   MCV 96.3 12/29/2014 1421   MCH 34.0 05/30/2023 1304   MCHC 34.4 05/30/2023 1304   RDW 12.5 05/30/2023 1304   RDW 13.2 12/29/2014 1421   LYMPHSABS 3.9 08/10/2019 0923   LYMPHSABS 3.8 (H) 12/29/2014 1421   MONOABS 1.1 (H) 08/10/2019 0923   MONOABS 1.2 (H) 12/29/2014 1421   EOSABS 0.5 08/10/2019 0923   EOSABS 0.5 12/29/2014 1421   BASOSABS 0.1 08/10/2019 0923   BASOSABS 0.0 12/29/2014 1421  BMET    Component Value Date/Time   NA 138 05/30/2023 1304   K 3.7 05/30/2023 1304   CL 103 05/30/2023 1304   CO2 24 05/30/2023 1304   GLUCOSE 140 (H) 05/30/2023 1304   BUN 16 05/30/2023 1304   CREATININE 1.00 05/30/2023 1304   CREATININE 0.94 06/12/2019 0852   CALCIUM 9.3 05/30/2023 1304   CALCIUM 9.4 05/16/2020 1000   GFRNONAA >60 05/30/2023 1304   GFRNONAA 89 06/12/2019 0852    Urinalysis See EPIC and HPI I have reviewed the labs.   Pertinent Imaging: N/A   Assessment & Plan:   1. High risk hematuria -former smoker -work up in 09/2019- Hypervascular prostate -no reports of gross  heme -UA negative for micro heme  3. BPH with LUTS -PSA stable -DRE benign  -UA benign  -continue conservative management, avoiding bladder irritants and timed voiding's   4. ED -continue tadalafil 5 mg daily  -We will also add tadalafil 20 mg on demand dosing for augmentation to see if he can get a more robust erection for intercourse   Return in about 1 year (around 06/06/2024) for PSA, I PSS, SHIM, DRE .  These notes generated with voice recognition software. I apologize for typographical errors.  Cloretta Ned  [facility] [address]  [address] [place], [place] [address] [phone]

## 2023-06-07 ENCOUNTER — Encounter: Payer: Self-pay | Admitting: Urology

## 2023-06-07 ENCOUNTER — Ambulatory Visit
Admission: RE | Admit: 2023-06-07 | Discharge: 2023-06-07 | Disposition: A | Discharge status: Home / Self Care | Payer: BC Managed Care – PPO | Source: Ambulatory Visit | Attending: Internal Medicine | Admitting: Internal Medicine

## 2023-06-07 ENCOUNTER — Ambulatory Visit: Payer: BC Managed Care – PPO | Admitting: Urology

## 2023-06-07 VITALS — BP 155/98 | HR 83

## 2023-06-07 DIAGNOSIS — N138 Other obstructive and reflux uropathy: Secondary | ICD-10-CM

## 2023-06-07 DIAGNOSIS — E042 Nontoxic multinodular goiter: Secondary | ICD-10-CM | POA: Diagnosis not present

## 2023-06-07 DIAGNOSIS — R319 Hematuria, unspecified: Secondary | ICD-10-CM

## 2023-06-07 DIAGNOSIS — N401 Enlarged prostate with lower urinary tract symptoms: Secondary | ICD-10-CM | POA: Diagnosis not present

## 2023-06-07 DIAGNOSIS — N529 Male erectile dysfunction, unspecified: Secondary | ICD-10-CM

## 2023-06-07 LAB — URINALYSIS, COMPLETE
Bilirubin, UA: NEGATIVE
Glucose, UA: NEGATIVE
Ketones, UA: NEGATIVE
Leukocytes,UA: NEGATIVE
Nitrite, UA: NEGATIVE
Protein,UA: NEGATIVE
RBC, UA: NEGATIVE
Specific Gravity, UA: 1.02 (ref 1.005–1.030)
Urobilinogen, Ur: 0.2 mg/dL (ref 0.2–1.0)
pH, UA: 6 (ref 5.0–7.5)

## 2023-06-07 LAB — MICROSCOPIC EXAMINATION

## 2023-06-07 MED ORDER — TADALAFIL 5 MG PO TABS: 5.0000 mg | ORAL_TABLET | Freq: Every day | ORAL | 11 refills | Status: AC

## 2023-06-07 MED ORDER — TADALAFIL 20 MG PO TABS: 20.0000 mg | ORAL_TABLET | Freq: Every day | ORAL | 3 refills | Status: AC | PRN

## 2023-06-17 ENCOUNTER — Encounter: Payer: Self-pay | Admitting: Internal Medicine

## 2023-06-19 ENCOUNTER — Other Ambulatory Visit (INDEPENDENT_AMBULATORY_CARE_PROVIDER_SITE_OTHER): Payer: Self-pay | Admitting: Vascular Surgery

## 2023-06-19 DIAGNOSIS — I714 Abdominal aortic aneurysm, without rupture, unspecified: Secondary | ICD-10-CM

## 2023-06-19 DIAGNOSIS — I70213 Atherosclerosis of native arteries of extremities with intermittent claudication, bilateral legs: Secondary | ICD-10-CM

## 2023-06-21 ENCOUNTER — Ambulatory Visit (INDEPENDENT_AMBULATORY_CARE_PROVIDER_SITE_OTHER): Payer: BC Managed Care – PPO

## 2023-06-21 ENCOUNTER — Ambulatory Visit (INDEPENDENT_AMBULATORY_CARE_PROVIDER_SITE_OTHER): Payer: BC Managed Care – PPO | Admitting: Vascular Surgery

## 2023-06-21 ENCOUNTER — Encounter (INDEPENDENT_AMBULATORY_CARE_PROVIDER_SITE_OTHER): Payer: Self-pay | Admitting: Vascular Surgery

## 2023-06-21 ENCOUNTER — Other Ambulatory Visit: Payer: Self-pay | Admitting: Internal Medicine

## 2023-06-21 VITALS — BP 163/109 | HR 71 | Resp 18 | Ht 72.0 in | Wt 205.0 lb

## 2023-06-21 DIAGNOSIS — E7800 Pure hypercholesterolemia, unspecified: Secondary | ICD-10-CM

## 2023-06-21 DIAGNOSIS — I70213 Atherosclerosis of native arteries of extremities with intermittent claudication, bilateral legs: Secondary | ICD-10-CM | POA: Diagnosis not present

## 2023-06-21 DIAGNOSIS — I7143 Infrarenal abdominal aortic aneurysm, without rupture: Secondary | ICD-10-CM | POA: Diagnosis not present

## 2023-06-21 DIAGNOSIS — I714 Abdominal aortic aneurysm, without rupture, unspecified: Secondary | ICD-10-CM | POA: Diagnosis not present

## 2023-06-21 DIAGNOSIS — E042 Nontoxic multinodular goiter: Secondary | ICD-10-CM

## 2023-06-21 DIAGNOSIS — I6523 Occlusion and stenosis of bilateral carotid arteries: Secondary | ICD-10-CM

## 2023-06-21 DIAGNOSIS — I10 Essential (primary) hypertension: Secondary | ICD-10-CM | POA: Diagnosis not present

## 2023-06-21 DIAGNOSIS — S46912A Strain of unspecified muscle, fascia and tendon at shoulder and upper arm level, left arm, initial encounter: Secondary | ICD-10-CM | POA: Diagnosis not present

## 2023-06-21 NOTE — Assessment & Plan Note (Signed)
 His ABIs today are 1.21 on the right and 1.18 on the left with triphasic waveforms and normal digital pressures.  Legs are doing great after revascularization concomitant to his aneurysm repair.  Continue to follow-up basis.

## 2023-06-21 NOTE — Assessment & Plan Note (Signed)
 Aortic duplex demonstrates a patent stent graft without endoleak.  The aortic sac size has decreased down to 4.4 cm in maximal diameter.  Doing well.  Continue to follow on an annual basis with duplex.

## 2023-06-21 NOTE — Progress Notes (Signed)
 MRN : [id number]  Antonio Russell is a 63 y.o. (08/21/1960) male who presents with chief complaint of  Chief Complaint  Patient presents with   Follow-up    40yr. evar. abi.  [name]  History of Present Illness: Patient returns today in follow up of multiple vascular issues.  The patient's biggest complaint recently has been of shoulder injury that is quite painful.  He has also been having some significant dizziness.  This is somewhat positional and he seems to notice it more in the morning when he is at work.  He has not had any focal symptoms such as arm or leg weakness or numbness, speech or swallowing difficulty, or temporary monocular blindness.  He last had a carotid duplex about 2 years ago which showed 1 to 39% right ICA stenosis and 40 to 59% left ICA stenosis.  He says his cardiologist follows this and he scheduled to see him in a few weeks. 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  His ABIs today are 1.21 on the right and 1.18 on the left with triphasic waveforms and normal digital pressures. He does not have any aneurysm related symptoms. Specifically, the patient denies new back or abdominal pain, or signs of peripheral embolization. Aortic duplex demonstrates a patent stent graft without endoleak.  The aortic sac size has decreased down to 4.4 cm in maximal diameter.   Current Outpatient Medications  Medication Sig Dispense Refill   aspirin EC 81 MG tablet Take 1 tablet (81 mg total) by mouth daily. Swallow whole. 90 tablet 3   Cholecalciferol (VITAMIN D3) 50 MCG (2000 UT) CAPS Take by mouth.     fexofenadine (ALLEGRA) 180 MG tablet Take 180 mg by mouth daily.      fluticasone (FLONASE) 50 MCG/ACT nasal spray SPRAY 2 SPRAYS INTO EACH NOSTRIL EVERY DAY 16 g 5   losartan (COZAAR) 50 MG tablet Take 1 tablet (50 mg total) by mouth in the morning and at bedtime. 180 tablet 0    REPATHA SURECLICK 140 MG/ML SOAJ Inject 140 mg into the skin every 14 (fourteen) days. 2 mL 6   tadalafil (CIALIS) 20 MG tablet Take 1 tablet (20 mg total) by mouth daily as needed for erectile dysfunction. 30 tablet 3   tadalafil (CIALIS) 5 MG tablet Take 1 tablet (5 mg total) by mouth daily. 30 tablet 11   No current facility-administered medications for this visit.    Past Medical History:  Diagnosis Date   Allergic rhinitis    Allergy    Anxiety    Arthritis    Back pain    CAD (coronary artery disease)    a. 2012 Cath: nonobstructive, nl LV fxn, rec aggressive med management; b. 05/2020 Cath/PCI: LM nl, LAD 41m, RI mod diff dzs, LCX 65p/m, OM3 20, RCA 30p, 7m (2.5x12 Resolute Onyx DES).   GERD (gastroesophageal reflux disease)    Headache(784.0)    History of kidney stones    HLD (hyperlipidemia)    Hyperparathyroidism (HCC)    Hypertension    Impotence of organic origin    Other testicular hypofunction    Peripheral vascular disease (HCC)    Personal history of urinary calculi    Sleep apnea    no c-pap or bi-pap   Thyroid disease    Hyperparathyroidism   Tobacco use disorder    Unspecified hearing loss  Past Surgical History:  Procedure Laterality Date   CARDIAC CATHETERIZATION     COLONOSCOPY     CORONARY STENT INTERVENTION N/A 06/14/2020   Procedure: CORONARY STENT INTERVENTION;  Surgeon: Yvonne Kendall, MD;  Location: [facility];  Service: Cardiovascular;  Laterality: N/A;   ENDOVASCULAR REPAIR/STENT GRAFT N/A 08/12/2019   Procedure: ENDOVASCULAR REPAIR/STENT GRAFT;  Surgeon: Annice Needy, MD;  Location: [facility];  Service: Cardiovascular;  Laterality: N/A;   EXTRACORPOREAL SHOCK WAVE LITHOTRIPSY Right 10/29/2019   Procedure: EXTRACORPOREAL SHOCK WAVE LITHOTRIPSY (ESWL);  Surgeon: Sondra Come, MD;  Location: [facility];  Service: Urology;  Laterality: Right;   HEMORRHOID SURGERY     HEMORRHOID SURGERY     KNEE SURGERY Right 2013    LEFT HEART CATH AND CORONARY ANGIOGRAPHY Left 06/14/2020   Procedure: LEFT HEART CATH AND CORONARY ANGIOGRAPHY;  Surgeon: Yvonne Kendall, MD;  Location: [facility];  Service: Cardiovascular;  Laterality: Left;   NASAL SEPTUM SURGERY     PARATHYROID EXPLORATION N/A 02/12/2020   Procedure: PARATHYROID EXPLORATION WITH PARATHROIDECTOMY;  Surgeon: Darnell Level, MD;  Location: [facility];  Service: General;  Laterality: N/A;     Social History   Tobacco Use   Smoking status: Former    Current packs/day: 0.00    Average packs/day: 0.3 packs/day for 35.0 years (8.8 ttl pk-yrs)    Types: Cigarettes    Start date: 10/07/1984    Quit date: 10/08/2019    Years since quitting: 3.7    Passive exposure: Past   Smokeless tobacco: Former    Types: Chew, Snuff   Tobacco comments:    couple of months - 40 yrs ago  Vaping Use   Vaping status: Former  Substance Use Topics   Alcohol use: Yes    Alcohol/week: 4.0 - 5.0 standard drinks of alcohol    Types: 4 - 5 Cans of beer per week    Comment: daily    Drug use: No      Family History  Problem Relation Age of Onset   Hypertension Mother    Hyperlipidemia Mother    Diabetes Mother    Hypertension Father    Hyperlipidemia Father    Cancer Father        throat   Hypertension Sister    Esophageal cancer Paternal Uncle    Colon cancer Other        dx "close to 35"   Lung cancer Other        GF   Heart disease Other        GM   Stomach cancer Neg Hx    Rectal cancer Neg Hx     Allergies  Allergen Reactions   Sertraline Hcl Other (See Comments)    Tremors   Amlodipine Other (See Comments)    Sexual side effects   Crestor [Rosuvastatin Calcium] Other (See Comments)    Myalgia   Lipitor [Atorvastatin Calcium] Other (See Comments)    Myalgia   Zocor [Simvastatin - High Dose] Other (See Comments)    Myalgia      REVIEW OF SYSTEMS (Negative unless checked)   Constitutional: [] Weight loss  [] Fever  [] Chills Cardiac:  [] Chest pain   [] Chest pressure   [] Palpitations   [] Shortness of breath when laying flat   [] Shortness of breath at rest   [] Shortness of breath with exertion. Vascular:  [x] Pain in legs with walking   [] Pain in legs at rest   [] Pain in legs when  laying flat   [] Claudication   [] Pain in feet when walking  [] Pain in feet at rest  [] Pain in feet when laying flat   [] History of DVT   [] Phlebitis   [] Swelling in legs   [] Varicose veins   [] Non-healing ulcers Pulmonary:   [] Uses home oxygen   [] Productive cough   [] Hemoptysis   [] Wheeze  [] COPD   [] Asthma Neurologic:  [x] Dizziness  [] Blackouts   [] Seizures   [] History of stroke   [] History of TIA  [] Aphasia   [] Temporary blindness   [] Dysphagia   [] Weakness or numbness in arms   [] Weakness or numbness in legs Musculoskeletal:  [x] Arthritis   [] Joint swelling   [x] Joint pain   [] Low back pain Hematologic:  [] Easy bruising  [] Easy bleeding   [] Hypercoagulable state   [] Anemic   Gastrointestinal:  [] Blood in stool   [] Vomiting blood  [] Gastroesophageal reflux/heartburn   [] Abdominal pain Genitourinary:  [] Chronic kidney disease   [] Difficult urination  [] Frequent urination  [] Burning with urination   [] Hematuria Skin:  [] Rashes   [] Ulcers   [] Wounds Psychological:  [] History of anxiety   []  History of major depression.  Physical Examination  BP (!) 163/109   Pulse 71   Resp 18   Ht 6' (1.829 m)   Wt 205 lb (93 kg)   BMI 27.80 kg/m  Gen:  WD/WN, NAD Head: [place]/AT, No temporalis wasting. Ear/Nose/Throat: Hearing grossly intact, nares w/o erythema or drainage Eyes: Conjunctiva clear. Sclera non-icteric Neck: Supple.  Trachea midline Pulmonary:  Good air movement, no use of accessory muscles.  Cardiac: RRR, no JVD Vascular:  Vessel Right Left  Radial Palpable Palpable                          PT Palpable Palpable  DP Palpable Palpable   Gastrointestinal: soft, non-tender/non-distended. No guarding/reflex.  Musculoskeletal: M/S 5/5  throughout.  No deformity or atrophy. No edema. Neurologic: Sensation grossly intact in extremities.  Symmetrical.  Speech is fluent.  Psychiatric: Judgment intact, Mood & affect appropriate for pt's clinical situation. Dermatologic: No rashes or ulcers noted.  No cellulitis or open wounds.      Labs Recent Results (from the past 2160 hours)  Basic metabolic panel     Status: Abnormal   Collection Time: 05/30/23  1:04 PM  Result Value Ref Range   Sodium 138 135 - 145 mmol/L   Potassium 3.7 3.5 - 5.1 mmol/L   Chloride 103 98 - 111 mmol/L   CO2 24 22 - 32 mmol/L   Glucose, Bld 140 (H) 70 - 99 mg/dL    Comment: Glucose reference range applies only to samples taken after fasting for at least 8 hours.   BUN 16 8 - 23 mg/dL   Creatinine, Ser 9.52 0.61 - 1.24 mg/dL   Calcium 9.3 8.9 - 84.1 mg/dL   GFR, Estimated >32 >44 mL/min    Comment: (NOTE) Calculated using the CKD-EPI Creatinine Equation (2021)    Anion gap 11 5 - 15    Comment: Performed at [facility], [address] [place], [place] [address]  CBC     Status: Abnormal   Collection Time: 05/30/23  1:04 PM  Result Value Ref Range   WBC 12.0 (H) 4.0 - 10.5 K/uL   RBC 4.30 4.22 - 5.81 MIL/uL   Hemoglobin 14.6 13.0 - 17.0 g/dL   HCT 25.3 66.4 - 40.3 %   MCV 98.8 80.0 - 100.0 fL  MCH 34.0 26.0 - 34.0 pg   MCHC 34.4 30.0 - 36.0 g/dL   RDW 57.8 46.9 - 62.9 %   Platelets 275 150 - 400 K/uL   nRBC 0.0 0.0 - 0.2 %    Comment: Performed at [facility], [address] [place], [place] [address]  Troponin I (High Sensitivity)     Status: None   Collection Time: 05/30/23  1:04 PM  Result Value Ref Range   Troponin I (High Sensitivity) 6 <18 ng/L    Comment: (NOTE) Elevated high sensitivity troponin I (hsTnI) values and significant  changes across serial measurements may suggest ACS but many other  chronic and acute conditions are known to elevate hsTnI results.  Refer to the "Links" section for chest  pain algorithms and additional  guidance. Performed at [facility], [address] [place], [place] [address]   Troponin I (High Sensitivity)     Status: None   Collection Time: 05/30/23  3:39 PM  Result Value Ref Range   Troponin I (High Sensitivity) 5 <18 ng/L    Comment: (NOTE) Elevated high sensitivity troponin I (hsTnI) values and significant  changes across serial measurements may suggest ACS but many other  chronic and acute conditions are known to elevate hsTnI results.  Refer to the "Links" section for chest pain algorithms and additional  guidance. Performed at [facility], [address] [place], [place] [address]   D-dimer, quantitative     Status: None   Collection Time: 05/30/23  3:39 PM  Result Value Ref Range   D-Dimer, Quant <0.27 0.00 - 0.50 ug/mL-FEU    Comment: (NOTE) At the manufacturer cut-off value of 0.5 g/mL FEU, this assay has a negative predictive value of 95-100%.This assay is intended for use in conjunction with a clinical pretest probability (PTP) assessment model to exclude pulmonary embolism (PE) and deep venous thrombosis (DVT) in outpatients suspected of PE or DVT. Results should be correlated with clinical presentation. Performed at [facility], [address] [place], [place] [address]   TSH     Status: None   Collection Time: 05/31/23  9:03 AM  Result Value Ref Range   TSH 1.010 0.450 - 4.500 uIU/mL  Hemoglobin A1c     Status: Abnormal   Collection Time: 05/31/23  9:03 AM  Result Value Ref Range   Hgb A1c MFr Bld 6.1 (H) 4.8 - 5.6 %    Comment:          Prediabetes: 5.7 - 6.4          Diabetes: >6.4          Glycemic control for adults with diabetes: <7.0    Est. average glucose Bld gHb Est-mCnc 128 mg/dL  Vitamin D, [id number]     Status: None   Collection Time: 05/31/23  9:03 AM  Result Value Ref Range   Vit D, 25-Hydroxy 35.8 30.0 - 100.0 ng/mL    Comment: Vitamin D deficiency has been  defined by the Institute of Medicine and an Endocrine Society practice guideline as a level of serum 25-OH vitamin D less than 20 ng/mL (1,2). The Endocrine Society went on to further define vitamin D insufficiency as a level between 21 and 29 ng/mL (2). 1. IOM (Institute of Medicine). 2010. Dietary reference    intakes for calcium and D. [place]: The    [employer]. 2. Holick MF, Binkley Con[place]ngham, Bischoff-Ferrari HA, et al.    Evaluation, treatment, and prevention  of vitamin D    deficiency: an Endocrine Society clinical practice    guideline. JCEM. 2011 Jul; 96(7):1911-30.   PSA     Status: None   Collection Time: 05/31/23  9:03 AM  Result Value Ref Range   Prostate Specific Ag, Serum 0.7 0.0 - 4.0 ng/mL    Comment: Roche ECLIA methodology. According to the American Urological Association, Serum PSA should decrease and remain at undetectable levels after radical prostatectomy. The AUA defines biochemical recurrence as an initial PSA value 0.2 ng/mL or greater followed by a subsequent confirmatory PSA value 0.2 ng/mL or greater. Values obtained with different assay methods or kits cannot be used interchangeably. Results cannot be interpreted as absolute evidence of the presence or absence of malignant disease.   Urinalysis, Complete     Status: None   Collection Time: 06/07/23  8:45 AM  Result Value Ref Range   Specific Gravity, UA 1.020 1.005 - 1.030   pH, UA 6.0 5.0 - 7.5   Color, UA Yellow Yellow   Appearance Ur Clear Clear   Leukocytes,UA Negative Negative   Protein,UA Negative Negative/Trace   Glucose, UA Negative Negative   Ketones, UA Negative Negative   RBC, UA Negative Negative   Bilirubin, UA Negative Negative   Urobilinogen, Ur 0.2 0.2 - 1.0 mg/dL   Nitrite, UA Negative Negative   Microscopic Examination See below:   Microscopic Examination     Status: None   Collection Time: 06/07/23  8:45 AM   Urine  Result Value Ref Range   WBC, UA 0-5 0 - 5  /hpf   RBC, Urine 0-2 0 - 2 /hpf   Epithelial Cells (non renal) 0-10 0 - 10 /hpf   Bacteria, UA Few None seen/Few    Radiology US THYROID Result Date: 06/08/2023 CLINICAL DATA:  Goiter. Slowly enlarging thyroid nodules. Patient has declined prior fine-needle aspiration biopsy recommendations. EXAM: THYROID ULTRASOUND TECHNIQUE: Ultrasound examination of the thyroid gland and adjacent soft tissues was performed. COMPARISON:  Multiple prior thyroid ultrasounds including 06/29/2022 and 10/14/2019 FINDINGS: Parenchymal Echotexture: Normal Isthmus: 0.5 cm Right lobe: 5.1 x 2.1 x 1.5 cm Left lobe: 4.9 x 2.1 x 1.7 cm _________________________________________________________ Estimated total number of nodules >/= 1 cm: 4 Number of spongiform nodules >/=  2 cm not described below (TR1): 0 Number of mixed cystic and solid nodules >/= 1.5 cm not described below (TR2): 0 _________________________________________________________ Nodule # 1: Solid hypoechoic nodule in the right mid gland measures 1.8 x 1.6 x 1.0 cm, insignificantly changed compared to the most recent prior imaging. However, this nodule has slowly enlarged compared to 1.2 x 1.2 x 0.8 cm in 2021. Findings are consistent with TI-RADS category 4. **Given size (>/= 1.5 cm) and appearance, fine needle aspiration of this moderately suspicious nodule should be considered based on TI-RADS criteria. Nodule # 2: Solid hypoechoic nodule in the right mid to lower gland remains unchanged at 1.1 x 1.0 x 1.0 cm. This nodule demonstrates no significant interval change dating back to July of 2021. findings are consistent with TI-RADS category 4. *Given size (>/= 1 - 1.4 cm) and appearance, a follow-up ultrasound in 1 year should be considered based on TI-RADS criteria. Nodule # 3: Solid isoechoic nodule in the left mid to upper gland measures up to 1.3 cm. Findings are consistent with TI-RADS category 3. Given size (<1.4 cm) and appearance, this nodule does NOT meet TI-RADS  criteria for biopsy or dedicated follow-up. Nodule # 4: Isoechoic solid nodule in  the left mid gland measures approximately 1.2 x 1.1 x 0.7 cm compared to 1.1 x 1.0 x 0.5 cm previously. Findings are consistent with TI-RADS category 4. This lesion is slowly enlarging compared to prior imaging from 2021. *Given size (>/= 1 - 1.4 cm) and appearance, a follow-up ultrasound in 1 year should be considered based on TI-RADS criteria. IMPRESSION: 1. Nodule # 1 is a 1.8 cm TI-RADS category 4 nodule in the right mid gland and continues to meet criteria to consider fine-needle aspiration biopsy. While there is no significant interval change compared to the most recent prior imaging from 1 year ago, this lesion does demonstrate slow interval growth when compared to more remote imaging from 2021. 2. Nodule # 2 is a 1.1 cm TI-RADS category 4 nodule in the right mid to lower gland demonstrates no significant interval change dating back to July of 2021. This exam confirms nearly 4 years of stability. Lesion continues to meet criteria for ultrasound surveillance. Recommend follow-up ultrasound in 1 year to confirm 5 years of stability and thus benignity. 3. Nodule # 4 is a slowly enlarging 1.2 cm TI-RADS category 4 nodule in the left mid gland and meets criteria for imaging surveillance. Recommend follow-up ultrasound in 1 year. The above is in keeping with the ACR TI-RADS recommendations - J Am Coll Radiol 2017;14:587-595. Electronically Signed   By: Malachy Moan M.D.   On: 06/08/2023 08:10   DG Chest 2 View Result Date: 05/30/2023 CLINICAL DATA:  Chest pain with breathing. EXAM: CHEST - 2 VIEW COMPARISON:  Chest radiographs 08/01/2012 FINDINGS: Cardiac silhouette and mediastinal contours are within normal limits. Mild calcification within the aortic arch. Mild bilateral perihilar and lower lung interstitial thickening is unchanged from prior and chronic. No focal airspace opacity. No pleural effusion pneumothorax. Moderate  multilevel degenerative disc changes of the thoracic spine. IMPRESSION: 1. No active cardiopulmonary disease. 2. Mild bilateral perihilar and lower lung interstitial thickening is unchanged from prior and chronic. This again may be secondary to chronic bronchitic change and the reported smoking history in the electronic medical record. Electronically Signed   By: Neita Garnet M.D.   On: 05/30/2023 15:37    Assessment/Plan  Atherosclerosis of native arteries of extremity with intermittent claudication (HCC) His ABIs today are 1.21 on the right and 1.18 on the left with triphasic waveforms and normal digital pressures.  Legs are doing great after revascularization concomitant to his aneurysm repair.  Continue to follow-up basis.  AAA (abdominal aortic aneurysm) without rupture (HCC) Aortic duplex demonstrates a patent stent graft without endoleak.  The aortic sac size has decreased down to 4.4 cm in maximal diameter.  Doing well.  Continue to follow on an annual basis with duplex.  Essential hypertension blood pressure control important in reducing the progression of atherosclerotic disease. On appropriate oral medications.     Hyperlipidemia lipid control important in reducing the progression of atherosclerotic disease. Continue statin therapy     Carotid stenosis This is unlikely to be the cause of his dizziness, but it does not appear this has been checked in about 2 years.  I am happy to check this but he says his cardiologist to be checking that.  He will contact our office if there is significant worsening on his duplex.  Festus Barren, MD  06/21/2023 10:44 AM    This note was created with Dragon medical transcription system.  Any errors from dictation are purely unintentional

## 2023-06-24 LAB — VAS US ABI WITH/WO TBI
Left ABI: 1.18
Right ABI: 1.21

## 2023-07-04 ENCOUNTER — Other Ambulatory Visit (HOSPITAL_COMMUNITY)
Admission: RE | Admit: 2023-07-04 | Discharge: 2023-07-04 | Disposition: A | Discharge status: Home / Self Care | Source: Ambulatory Visit | Attending: Internal Medicine | Admitting: Internal Medicine

## 2023-07-04 ENCOUNTER — Ambulatory Visit
Admission: RE | Admit: 2023-07-04 | Discharge: 2023-07-04 | Disposition: A | Discharge status: Home / Self Care | Source: Ambulatory Visit | Attending: Internal Medicine | Admitting: Internal Medicine

## 2023-07-04 DIAGNOSIS — E042 Nontoxic multinodular goiter: Secondary | ICD-10-CM

## 2023-07-04 DIAGNOSIS — E041 Nontoxic single thyroid nodule: Secondary | ICD-10-CM | POA: Diagnosis not present

## 2023-07-08 ENCOUNTER — Encounter: Payer: Self-pay | Admitting: Internal Medicine

## 2023-07-08 LAB — CYTOLOGY - NON PAP

## 2023-07-10 DIAGNOSIS — J069 Acute upper respiratory infection, unspecified: Secondary | ICD-10-CM | POA: Diagnosis not present

## 2023-08-20 ENCOUNTER — Encounter (INDEPENDENT_AMBULATORY_CARE_PROVIDER_SITE_OTHER): Payer: Self-pay

## 2023-08-28 ENCOUNTER — Other Ambulatory Visit: Payer: Self-pay

## 2023-08-28 MED ORDER — LOSARTAN POTASSIUM 50 MG PO TABS
50.0000 mg | ORAL_TABLET | Freq: Two times a day (BID) | ORAL | 0 refills | Status: DC
Start: 2023-08-28 — End: 2023-11-20

## 2023-09-13 ENCOUNTER — Encounter: Payer: Self-pay | Admitting: Cardiology

## 2023-09-13 ENCOUNTER — Ambulatory Visit: Attending: Cardiology | Admitting: Cardiology

## 2023-09-13 VITALS — BP 115/59 | HR 100 | Ht 71.0 in | Wt 207.2 lb

## 2023-09-13 DIAGNOSIS — I2510 Atherosclerotic heart disease of native coronary artery without angina pectoris: Secondary | ICD-10-CM

## 2023-09-13 DIAGNOSIS — E785 Hyperlipidemia, unspecified: Secondary | ICD-10-CM | POA: Diagnosis not present

## 2023-09-13 DIAGNOSIS — I10 Essential (primary) hypertension: Secondary | ICD-10-CM | POA: Diagnosis not present

## 2023-09-13 NOTE — Patient Instructions (Signed)

## 2023-09-13 NOTE — Progress Notes (Signed)
 Cardiology Office Note:    Date:  09/13/2023   ID:  Antonio Russell, DOB 27-Apr-1960, MRN [id number]  PCP:  Clemens Curt, MD  Cardiologist:  Constancia Delton, MD  Electrophysiologist:  None   Referring MD: Clemens Curt, MD   Chief Complaint  Patient presents with   Follow-up    1 year follow up    History of Present Illness:    Antonio Russell is a 63 y.o. male with a hx of CAD/PCI to [facility] 05/2020 (LAD 50%, mLCx 60-70%), hyperlipidemia, hypertension, former smoker x40+ years, AAA s/p endoprosthetic repair 08/2019,  peripheral arterial disease s/p R and L iliac stents, sleep apnea who presents for follow-up.   Patient states doing okay, denies chest pain or shortness of breath.  Undergoing a lot of stress due to wife being diagnosed with breast cancer, started on chemotherapy earlier this week.  Patient denies any new cardiac concerns.  Prior notes Echocardiogram 05/2020 showed normal systolic and diastolic function, EF 55 to 60%  Coronary CTA 05/2020 showed calcium score 458, severe stenosis in the proximal left circumflex, moderate stenosis in the proximal RCA, CT FFR showed significant mid left circumflex stenosis.   Left heart cath 06/14/2020 showed 50% mid LAD stenosis, 60 to 70% proximal/mid left circumflex disease, 95% mid RCA lesion.  Underwent PCI to mid RCA using drug-eluting stent.   Past Medical History:  Diagnosis Date   Allergic rhinitis    Allergy    Anxiety    Arthritis    Back pain    CAD (coronary artery disease)    a. 2012 Cath: nonobstructive, nl LV fxn, rec aggressive med management; b. 05/2020 Cath/PCI: LM nl, LAD 60m, RI mod diff dzs, LCX 65p/m, OM3 20, RCA 30p, 29m (2.5x12 Resolute Onyx DES).   GERD (gastroesophageal reflux disease)    Headache(784.0)    History of kidney stones    HLD (hyperlipidemia)    Hyperparathyroidism (HCC)    Hypertension    Impotence of organic origin    Other testicular hypofunction    Peripheral vascular disease (HCC)     Personal history of urinary calculi    Sleep apnea    no c-pap or bi-pap   Thyroid  disease    Hyperparathyroidism   Tobacco use disorder    Unspecified hearing loss     Past Surgical History:  Procedure Laterality Date   CARDIAC CATHETERIZATION     COLONOSCOPY     CORONARY STENT INTERVENTION N/A 06/14/2020   Procedure: CORONARY STENT INTERVENTION;  Surgeon: Sammy Crisp, MD;  Location: [facility] INVASIVE CV LAB;  Service: Cardiovascular;  Laterality: N/A;   ENDOVASCULAR REPAIR/STENT GRAFT N/A 08/12/2019   Procedure: ENDOVASCULAR REPAIR/STENT GRAFT;  Surgeon: Celso College, MD;  Location: [facility] INVASIVE CV LAB;  Service: Cardiovascular;  Laterality: N/A;   EXTRACORPOREAL SHOCK WAVE LITHOTRIPSY Right 10/29/2019   Procedure: EXTRACORPOREAL SHOCK WAVE LITHOTRIPSY (ESWL);  Surgeon: Lawerence Pressman, MD;  Location: [facility] ORS;  Service: Urology;  Laterality: Right;   HEMORRHOID SURGERY     HEMORRHOID SURGERY     KNEE SURGERY Right 2013   LEFT HEART CATH AND CORONARY ANGIOGRAPHY Left 06/14/2020   Procedure: LEFT HEART CATH AND CORONARY ANGIOGRAPHY;  Surgeon: Sammy Crisp, MD;  Location: [facility] INVASIVE CV LAB;  Service: Cardiovascular;  Laterality: Left;   NASAL SEPTUM SURGERY     PARATHYROID  EXPLORATION N/A 02/12/2020   Procedure: PARATHYROID  EXPLORATION WITH PARATHROIDECTOMY;  Surgeon: Oralee Billow, MD;  Location: [facility];  Service: General;  Laterality: N/A;    Current Medications: Current Meds  Medication Sig   aspirin  EC 81 MG tablet Take 1 tablet (81 mg total) by mouth daily. Swallow whole.   Cholecalciferol (VITAMIN D3) 50 MCG (2000 UT) CAPS Take by mouth.   fexofenadine (ALLEGRA) 180 MG tablet Take 180 mg by mouth daily.    fluticasone  (FLONASE ) 50 MCG/ACT nasal spray SPRAY 2 SPRAYS INTO EACH NOSTRIL EVERY DAY   losartan  (COZAAR ) 50 MG tablet Take 1 tablet (50 mg total) by mouth in the morning and at bedtime.   REPATHA  SURECLICK 140 MG/ML SOAJ Inject 140 mg into the skin every 14  (fourteen) days.   tadalafil  (CIALIS ) 20 MG tablet Take 1 tablet (20 mg total) by mouth daily as needed for erectile dysfunction.   tadalafil  (CIALIS ) 5 MG tablet Take 1 tablet (5 mg total) by mouth daily.     Allergies:   Sertraline hcl, Amlodipine , Crestor [rosuvastatin calcium], Lipitor [atorvastatin calcium], and Zocor  [simvastatin  - high dose]   Social History   Socioeconomic History   Marital status: Married    Spouse name: Not on file   Number of children: Not on file   Years of education: Not on file   Highest education level: Not on file  Occupational History   Not on file  Tobacco Use   Smoking status: Former    Current packs/day: 0.00    Average packs/day: 0.3 packs/day for 35.0 years (8.8 ttl pk-yrs)    Types: Cigarettes    Start date: 10/07/1984    Quit date: 10/08/2019    Years since quitting: 3.9    Passive exposure: Past   Smokeless tobacco: Former    Types: Chew, Snuff   Tobacco comments:    couple of months - 40 yrs ago  Vaping Use   Vaping status: Former  Substance and Sexual Activity   Alcohol use: Yes    Alcohol/week: 4.0 - 5.0 standard drinks of alcohol    Types: 4 - 5 Cans of beer per week    Comment: daily    Drug use: No   Sexual activity: Yes    Birth control/protection: None  Other Topics Concern   Not on file  Social History Narrative   Coaches basketball   Social Drivers of Health   Financial Resource Strain: Not on file  Food Insecurity: Not on file  Transportation Needs: Not on file  Physical Activity: Not on file  Stress: Not on file  Social Connections: Not on file     Family History: The patient's family history includes Cancer in his father; Colon cancer in an other family member; Diabetes in his mother; Esophageal cancer in his paternal uncle; Heart disease in an other family member; Hyperlipidemia in his father and mother; Hypertension in his father, mother, and sister; Lung cancer in an other family member. There is no history  of Stomach cancer or Rectal cancer.  ROS:   Please see the history of present illness.     All other systems reviewed and are negative.  EKGs/Labs/Other Studies Reviewed:    The following studies were reviewed today:  EKG Interpretation Date/Time:  Friday September 13 2023 14:45:32 EDT Ventricular Rate:  100 PR Interval:  170 QRS Duration:  96 QT Interval:  328 QTC Calculation: 423 R Axis:   54  Text Interpretation: Normal sinus rhythm Possible Left atrial enlargement Confirmed by Constancia Delton ([id number]) on 09/13/2023 3:05:34 PM    Recent Labs: 05/30/2023: BUN 16; Creatinine, Ser 1.00; Hemoglobin  14.6; Platelets 275; Potassium 3.7; Sodium 138 05/31/2023: TSH 1.010  Recent Lipid Panel    Component Value Date/Time   CHOL 164 05/24/2022 1008   TRIG 139 05/24/2022 1008   HDL 64 05/24/2022 1008   CHOLHDL 2.6 05/24/2022 1008   VLDL 28 05/24/2022 1008   LDLCALC 72 05/24/2022 1008   LDLDIRECT 225.0 05/01/2019 0938    Physical Exam:    VS:  BP (!) 115/59 (BP Location: Left Arm, Patient Position: Sitting, Cuff Size: Normal)   Pulse 100   Ht 5' 11 (1.803 m)   Wt 207 lb 3.2 oz (94 kg)   SpO2 98%   BMI 28.90 kg/m     Wt Readings from Last 3 Encounters:  09/13/23 207 lb 3.2 oz (94 kg)  06/21/23 205 lb (93 kg)  05/31/23 205 lb 12.8 oz (93.4 kg)     GEN:  Well nourished, well developed in no acute distress HEENT: Normal NECK: No JVD; No carotid bruits CARDIAC: RRR, no murmurs, rubs, gallops RESPIRATORY:  Clear to auscultation without rales, wheezing or rhonchi  ABDOMEN: Soft, non-tender, non-distended MUSCULOSKELETAL:  No edema; No deformity  SKIN: Warm and dry NEUROLOGIC:  Alert and oriented x 3 PSYCHIATRIC:  Normal affect   ASSESSMENT:    1. Coronary artery disease involving native coronary artery of native heart without angina pectoris   2. Hyperlipidemia LDL goal <70   3. Essential hypertension    PLAN:    CAD/PCI to mid RCA 05/2020.  Denies chest pain.   Continue aspirin , Repatha .   hyperlipidemia.  Cholesterol controlled.  Continue continue Repatha ,  hypertension.  BP controlled.  Continue losartan  50 mg twice daily.  Follow-up in 12 months  Medication Adjustments/Labs and Tests Ordered: Current medicines are reviewed at length with the patient today.  Concerns regarding medicines are outlined above.  Orders Placed This Encounter  Procedures   EKG 12-Lead   No orders of the defined types were placed in this encounter.   Patient Instructions  Medication Instructions:  Your physician recommends that you continue on your current medications as directed. Please refer to the Current Medication list given to you today.   *If you need a refill on your cardiac medications before your next appointment, please call your pharmacy*  Lab Work: No labs ordered today  If you have labs (blood work) drawn today and your tests are completely normal, you will receive your results only by: MyChart Message (if you have MyChart) OR A paper copy in the mail If you have any lab test that is abnormal or we need to change your treatment, we will call you to review the results.  Testing/Procedures: No test ordered today   Follow-Up: At [facility], you and your health needs are our priority.  As part of our continuing mission to provide you with exceptional heart care, our providers are all part of one team.  This team includes your primary Cardiologist (physician) and Advanced Practice Providers or APPs (Physician Assistants and Nurse Practitioners) who all work together to provide you with the care you need, when you need it.  Your next appointment:   1 year(s)  Provider:   You may see Constancia Delton, MD or one of the following Advanced Practice Providers on your designated Care Team:   Laneta Pintos, NP Gildardo Labrador, PA-C Varney Gentleman, PA-C Cadence Millersburg, PA-C Ronald Cockayne, NP Morey Ar, NP    We recommend signing up for  the patient portal called MyChart.  Sign up  information is provided on this After Visit Summary.  MyChart is used to connect with patients for Virtual Visits (Telemedicine).  Patients are able to view lab/test results, encounter notes, upcoming appointments, etc.  Non-urgent messages can be sent to your provider as well.   To learn more about what you can do with MyChart, go to [email or web address].          Signed, Constancia Delton, MD  09/13/2023 4:48 PM    [facility]

## 2023-10-01 ENCOUNTER — Encounter: Payer: Self-pay | Admitting: Internal Medicine

## 2023-10-01 ENCOUNTER — Ambulatory Visit (INDEPENDENT_AMBULATORY_CARE_PROVIDER_SITE_OTHER): Admitting: Internal Medicine

## 2023-10-01 VITALS — BP 138/86 | HR 93 | Temp 98.5°F | Ht 71.0 in | Wt 208.0 lb

## 2023-10-01 DIAGNOSIS — R1011 Right upper quadrant pain: Secondary | ICD-10-CM

## 2023-10-01 LAB — CBC
HCT: 44 % (ref 39.0–52.0)
Hemoglobin: 14.5 g/dL (ref 13.0–17.0)
MCHC: 33 g/dL (ref 30.0–36.0)
MCV: 98.2 fl (ref 78.0–100.0)
Platelets: 284 10*3/uL (ref 150.0–400.0)
RBC: 4.47 Mil/uL (ref 4.22–5.81)
RDW: 13.3 % (ref 11.5–15.5)
WBC: 8.8 10*3/uL (ref 4.0–10.5)

## 2023-10-01 LAB — RENAL FUNCTION PANEL
Albumin: 4.4 g/dL (ref 3.5–5.2)
BUN: 14 mg/dL (ref 6–23)
CO2: 27 meq/L (ref 19–32)
Calcium: 9.6 mg/dL (ref 8.4–10.5)
Chloride: 102 meq/L (ref 96–112)
Creatinine, Ser: 0.97 mg/dL (ref 0.40–1.50)
GFR: 83.51 mL/min (ref >60.00)
Glucose, Bld: 105 mg/dL — ABNORMAL HIGH (ref 70–99)
Phosphorus: 4.1 mg/dL (ref 2.3–4.6)
Potassium: 4.4 meq/L (ref 3.5–5.1)
Sodium: 136 meq/L (ref 135–145)

## 2023-10-01 LAB — HEPATIC FUNCTION PANEL
ALT: 20 U/L (ref 0–53)
AST: 17 U/L (ref 0–37)
Albumin: 4.4 g/dL (ref 3.5–5.2)
Alkaline Phosphatase: 74 U/L (ref 39–117)
Bilirubin, Direct: 0.1 mg/dL (ref 0.0–0.3)
Total Bilirubin: 0.4 mg/dL (ref 0.2–1.2)
Total Protein: 7.3 g/dL (ref 6.0–8.3)

## 2023-10-01 LAB — SEDIMENTATION RATE: Sed Rate: 30 mm/h — ABNORMAL HIGH (ref 0–20)

## 2023-10-01 NOTE — Progress Notes (Signed)
 Subjective:    Patient ID: Antonio Russell, male    DOB: 1960/06/01, 63 y.o.   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 Swallow whole. 90 tablet 3   Cholecalciferol (VITAMIN D3) 50 MCG (2000 UT) CAPS Take by mouth.     fexofenadine (ALLEGRA) 180 MG tablet Take 180 mg by mouth daily.      fluticasone  (FLONASE ) 50 MCG/ACT nasal spray SPRAY 2 SPRAYS INTO EACH NOSTRIL EVERY DAY 16 g 5   losartan  (COZAAR ) 50 MG tablet Take 1 tablet (50 mg total) by mouth in the morning and at bedtime. 180 tablet 0   REPATHA  SURECLICK 140 MG/ML SOAJ Inject 140 mg into the skin every 14 (fourteen) days. 2 mL 6   tadalafil  (CIALIS ) 20 MG tablet Take 1 tablet (20 mg total) by mouth daily as needed for erectile dysfunction. 30 tablet 3   tadalafil  (CIALIS ) 5 MG tablet Take 1 tablet (5 mg total) by mouth daily. 30 tablet 11   No current facility-administered medications on file prior to visit.    Allergies  Allergen Reactions   Sertraline Hcl Other (See Comments)    Tremors   Amlodipine  Other (See Comments)    Sexual side effects   Crestor [Rosuvastatin Calcium] Other (See Comments)    Myalgia   Lipitor [Atorvastatin Calcium] Other (See Comments)    Myalgia   Zocor  [Simvastatin  - High Dose] Other (See Comments)    Myalgia     Past Medical History:  Diagnosis Date   Allergic rhinitis    Allergy    Anxiety    Arthritis    Back pain    CAD (coronary artery  disease)    a. 2012 Cath: nonobstructive, nl LV fxn, rec aggressive med management; b. 05/2020 Cath/PCI: LM nl, LAD 40m, RI mod diff dzs, LCX 65p/m, OM3 20, RCA 30p, 43m (2.5x12 Resolute Onyx DES).   GERD (gastroesophageal reflux disease)    Headache(784.0)    History of kidney stones    HLD (hyperlipidemia)    Hyperparathyroidism (HCC)    Hypertension    Impotence of organic origin    Other testicular hypofunction    Peripheral vascular disease (HCC)    Personal history of urinary calculi    Sleep apnea    no c-pap or bi-pap   Thyroid  disease    Hyperparathyroidism   Tobacco use disorder    Unspecified hearing loss     Past Surgical History:  Procedure Laterality Date   CARDIAC CATHETERIZATION     COLONOSCOPY     CORONARY STENT INTERVENTION N/A 06/14/2020   Procedure: CORONARY STENT INTERVENTION;  Surgeon: Mady Bruckner, MD;  Location: [facility] INVASIVE CV LAB;  Service: Cardiovascular;  Laterality: N/A;   ENDOVASCULAR REPAIR/STENT GRAFT N/A 08/12/2019   Procedure: ENDOVASCULAR REPAIR/STENT GRAFT;  Surgeon: Marea Selinda RAMAN, MD;  Location: [facility] CV [facility];  Service: Cardiovascular;  Laterality: N/A;   EXTRACORPOREAL SHOCK WAVE LITHOTRIPSY Right  10/29/2019   Procedure: EXTRACORPOREAL SHOCK WAVE LITHOTRIPSY (ESWL);  Surgeon: Francisca Redell BROCKS, MD;  Location: [facility] ORS;  Service: Urology;  Laterality: Right;   HEMORRHOID SURGERY     HEMORRHOID SURGERY     KNEE SURGERY Right 2013   LEFT HEART CATH AND CORONARY ANGIOGRAPHY Left 06/14/2020   Procedure: LEFT HEART CATH AND CORONARY ANGIOGRAPHY;  Surgeon: Mady Bruckner, MD;  Location: [facility] CV [facility];  Service: Cardiovascular;  Laterality: Left;   NASAL SEPTUM SURGERY     PARATHYROID  EXPLORATION N/A 02/12/2020   Procedure: PARATHYROID  EXPLORATION WITH PARATHROIDECTOMY;  Surgeon: Eletha Boas, MD;  Location: [facility];  Service: General;  Laterality: N/A;    Family History  Problem Relation Age of Onset   Hypertension Mother     Hyperlipidemia Mother    Diabetes Mother    Hypertension Father    Hyperlipidemia Father    Cancer Father        throat   Hypertension Sister    Esophageal cancer Paternal Uncle    Colon cancer Other        dx close to 46   Lung cancer Other        GF   Heart disease Other        GM   Stomach cancer Neg Hx    Rectal cancer Neg Hx     Social History   Socioeconomic History   Marital status: Married    Spouse name: Not on file   Number of children: Not on file   Years of education: Not on file   Highest education level: Not on file  Occupational History   Not on file  Tobacco Use   Smoking status: Former    Current packs/day: 0.00    Average packs/day: 0.3 packs/day for 35.0 years (8.8 ttl pk-yrs)    Types: Cigarettes    Start date: 10/07/1984    Quit date: 10/08/2019    Years since quitting: 3.9    Passive exposure: Past   Smokeless tobacco: Former    Types: Chew, Snuff   Tobacco comments:    couple of months - 40 yrs ago  Vaping Use   Vaping status: Former  Substance and Sexual Activity   Alcohol use: Yes    Alcohol/week: 4.0 - 5.0 standard drinks of alcohol    Types: 4 - 5 Cans of beer per week    Comment: daily    Drug use: No   Sexual activity: Yes    Birth control/protection: None  Other Topics Concern   Not on file  Social History Narrative   Coaches basketball   Social Drivers of Health   Financial Resource Strain: Not on file  Food Insecurity: Not on file  Transportation Needs: Not on file  Physical Activity: Not on file  Stress: Not on file  Social Connections: Not on file  Intimate Partner Violence: Not on file   Review of Systems No N/V Bowels move okay No fever Appetite is fine Weight is up a few pounds 3-4 beers daily No cough or SOB    Objective:   Physical Exam Constitutional:      Appearance: Normal appearance.   Cardiovascular:     Rate and Rhythm: Normal rate and regular rhythm.     Heart sounds: No murmur heard.    No  gallop.  Pulmonary:     Effort: Pulmonary effort is normal.     Breath sounds: Normal breath sounds. No wheezing or rales.  Abdominal:  General: There is no distension.     Palpations: Abdomen is soft.     Tenderness: There is no abdominal tenderness. There is no guarding or rebound.     Comments: Liver percusses ?slightly enlarged Murphy's sign absent No enlarged spleen apparent   Musculoskeletal:     Cervical back: Neck supple.     Right lower leg: No edema.     Left lower leg: No edema.     Comments: No tenderness along flank where pain is  Lymphadenopathy:     Cervical: No cervical adenopathy.   Neurological:     Mental Status: He is alert.            Assessment & Plan:

## 2023-10-01 NOTE — Assessment & Plan Note (Signed)
 Doesn't seem to be gallbladder related ?fatty liver ?muscular--though atypical for this  Will check labs Check RUQ ultrasound

## 2023-10-02 ENCOUNTER — Ambulatory Visit: Payer: Self-pay | Admitting: Internal Medicine

## 2023-10-02 ENCOUNTER — Inpatient Hospital Stay
Admission: RE | Admit: 2023-10-02 | Discharge: 2023-10-02 | Discharge status: Home / Self Care | Source: Ambulatory Visit | Attending: Internal Medicine | Admitting: Internal Medicine

## 2023-10-02 DIAGNOSIS — R1011 Right upper quadrant pain: Secondary | ICD-10-CM

## 2023-11-20 ENCOUNTER — Other Ambulatory Visit: Payer: Self-pay | Admitting: Cardiology

## 2024-03-06 ENCOUNTER — Ambulatory Visit

## 2024-03-06 VITALS — Ht 71.0 in | Wt 205.0 lb

## 2024-03-06 DIAGNOSIS — Z860100 Personal history of colon polyps, unspecified: Secondary | ICD-10-CM

## 2024-03-06 MED ORDER — NA SULFATE-K SULFATE-MG SULF 17.5-3.13-1.6 GM/177ML PO SOLN: 1.0000 | Freq: Once | ORAL | 0 refills | Status: AC

## 2024-03-06 NOTE — Progress Notes (Signed)
 Pre visit completed via phone call; Patient verified name, DOB, and address; No egg or soy allergy known to patient  No issues known to pt with past sedation with any surgeries or procedures Patient denies ever being told they had issues or difficulty with intubation  No FH of Malignant Hyperthermia Pt is not on diet pills Pt is not on home 02  Pt is not on blood thinners  Pt denies issues with constipation;  No A fib or A flutter Have any cardiac testing pending--NO Insurance verified during PV appt--- BCBS Pt can ambulate without assistance;  Pt denies use of chewing tobacco Discussed diabetic/weight loss medication holds; Discussed NSAID holds; Checked BMI to be less than 50; Pt instructed to use Single[employer].com or GoodRx for a price reduction on prep  Patient's chart reviewed by Norleen Schillings CNRA prior to previsit and patient appropriate for the [facility].  Pre visit completed and red dot placed by patient's name on their procedure day (on provider's schedule).   Instructions sent to MyChart as well as printed

## 2024-03-12 ENCOUNTER — Encounter: Payer: Self-pay | Admitting: Gastroenterology

## 2024-03-20 ENCOUNTER — Encounter: Payer: Self-pay | Admitting: Gastroenterology

## 2024-03-20 ENCOUNTER — Ambulatory Visit (AMBULATORY_SURGERY_CENTER): Admitting: Gastroenterology

## 2024-03-20 VITALS — BP 103/73 | HR 89 | Temp 97.4°F | Resp 16 | Ht 71.0 in | Wt 205.0 lb

## 2024-03-20 DIAGNOSIS — D122 Benign neoplasm of ascending colon: Secondary | ICD-10-CM | POA: Diagnosis not present

## 2024-03-20 DIAGNOSIS — D120 Benign neoplasm of cecum: Secondary | ICD-10-CM | POA: Diagnosis not present

## 2024-03-20 DIAGNOSIS — Z860101 Personal history of adenomatous and serrated colon polyps: Secondary | ICD-10-CM | POA: Diagnosis not present

## 2024-03-20 DIAGNOSIS — D124 Benign neoplasm of descending colon: Secondary | ICD-10-CM | POA: Diagnosis not present

## 2024-03-20 DIAGNOSIS — D123 Benign neoplasm of transverse colon: Secondary | ICD-10-CM

## 2024-03-20 DIAGNOSIS — K635 Polyp of colon: Secondary | ICD-10-CM

## 2024-03-20 DIAGNOSIS — Z1211 Encounter for screening for malignant neoplasm of colon: Secondary | ICD-10-CM | POA: Diagnosis present

## 2024-03-20 DIAGNOSIS — D128 Benign neoplasm of rectum: Secondary | ICD-10-CM | POA: Diagnosis not present

## 2024-03-20 DIAGNOSIS — Z860100 Personal history of colon polyps, unspecified: Secondary | ICD-10-CM

## 2024-03-20 MED ORDER — SODIUM CHLORIDE 0.9 % IV SOLN: 500.0000 mL | Freq: Once | INTRAVENOUS | Status: DC

## 2024-03-20 NOTE — Patient Instructions (Addendum)
 Handouts given: Polyps Resume previous diet. Continue present medications.  Await pathology results. Repeat colonoscopy in 3 years for surveillance.  YOU HAD AN ENDOSCOPIC PROCEDURE TODAY AT THE [facility]:   Refer to the procedure report that was given to you for any specific questions about what was found during the examination.  If the procedure report does not answer your questions, please call your gastroenterologist to clarify.  If you requested that your care partner not be given the details of your procedure findings, then the procedure report has been included in a sealed envelope for you to review at your convenience later.  YOU SHOULD EXPECT: Some feelings of bloating in the abdomen. Passage of more gas than usual.  Walking can help get rid of the air that was put into your GI tract during the procedure and reduce the bloating. If you had a lower endoscopy (such as a colonoscopy or flexible sigmoidoscopy) you may notice spotting of blood in your stool or on the toilet paper. If you underwent a bowel prep for your procedure, you may not have a normal bowel movement for a few days.  Please Note:  You might notice some irritation and congestion in your nose or some drainage.  This is from the oxygen used during your procedure.  There is no need for concern and it should clear up in a day or so.  SYMPTOMS TO REPORT IMMEDIATELY:  Following lower endoscopy (colonoscopy or flexible sigmoidoscopy):  Excessive amounts of blood in the stool  Significant tenderness or worsening of abdominal pains  Swelling of the abdomen that is new, acute  Fever of 100F or higher  For urgent or emergent issues, a gastroenterologist can be reached at any hour by calling [phone]. Do not use MyChart messaging for urgent concerns.    DIET:  We do recommend a small meal at first, but then you may proceed to your regular diet.  Drink plenty of fluids but you should avoid alcoholic beverages  for 24 hours.  ACTIVITY:  You should plan to take it easy for the rest of today and you should NOT DRIVE or use heavy machinery until tomorrow (because of the sedation medicines used during the test).    FOLLOW UP: Our staff will call the number listed on your records the next business day following your procedure.  We will call around 7:15- 8:00 am to check on you and address any questions or concerns that you may have regarding the information given to you following your procedure. If we do not reach you, we will leave a message.     If any biopsies were taken you will be contacted by phone or by letter within the next 1-3 weeks.  Please call us  at [phone] if you have not heard about the biopsies in 3 weeks.    SIGNATURES/CONFIDENTIALITY: You and/or your care partner have signed paperwork which will be entered into your electronic medical record.  These signatures attest to the fact that that the information above on your After Visit Summary has been reviewed and is understood.  Full responsibility of the confidentiality of this discharge information lies with you and/or your care-partner.

## 2024-03-20 NOTE — Progress Notes (Signed)
 [facility] History and Physical   Primary Care Physician:  Tower, Laine LABOR, MD   Reason for Procedure:   Colon cancer screening/history of polyps  Plan:    Surveillance colonoscopy    HPI: Antonio Russell is a 63 y.o. male undergoing surveillance colonoscopy.  His brother was diagnosed with colon cancer in his 47s.  He has no chronic GI symptoms.   His last colonoscopy was in Nov 2021 and 3 polyps were removed (1 adenoma and 2 hyperplastic) In 2018 he had adenomas, SSPs and hyperplastic polyps.   The patient was provided an opportunity to ask questions and all were answered. The patient agreed with the plan    Past Medical History:  Diagnosis Date   Allergic rhinitis    Allergy    Anxiety    Arthritis    Back pain    CAD (coronary artery disease)    a. 2012 Cath: nonobstructive, nl LV fxn, rec aggressive med management; b. 05/2020 Cath/PCI: LM nl, LAD 72m, RI mod diff dzs, LCX 65p/m, OM3 20, RCA 30p, 16m (2.5x12 Resolute Onyx DES).   GERD (gastroesophageal reflux disease)    Headache(784.0)    History of kidney stones    HLD (hyperlipidemia)    Hyperparathyroidism    Hypertension    Impotence of organic origin    Other testicular hypofunction    Peripheral vascular disease    Personal history of urinary calculi    Sleep apnea    no c-pap or bi-pap   Thyroid  disease    Hyperparathyroidism   Tobacco use disorder    Unspecified hearing loss     Past Surgical History:  Procedure Laterality Date   CARDIAC CATHETERIZATION     COLONOSCOPY  02/2020   MS-MAC-sutab (adequate after lavage)-TA   CORONARY STENT INTERVENTION N/A 06/14/2020   Procedure: CORONARY STENT INTERVENTION;  Surgeon: Mady Bruckner, MD;  Location: [facility];  Service: Cardiovascular;  Laterality: N/A;   ENDOVASCULAR STENT GRAFT (AAA) N/A 08/12/2019   Procedure: ENDOVASCULAR REPAIR/STENT GRAFT;  Surgeon: Marea Selinda RAMAN, MD;  Location: [facility];  Service: Cardiovascular;   Laterality: N/A;   EXTRACORPOREAL SHOCK WAVE LITHOTRIPSY Right 10/29/2019   Procedure: EXTRACORPOREAL SHOCK WAVE LITHOTRIPSY (ESWL);  Surgeon: Francisca Redell BROCKS, MD;  Location: [facility];  Service: Urology;  Laterality: Right;   HEMORRHOID SURGERY     HEMORRHOID SURGERY     KNEE SURGERY Right 2013   LEFT HEART CATH AND CORONARY ANGIOGRAPHY Left 06/14/2020   Procedure: LEFT HEART CATH AND CORONARY ANGIOGRAPHY;  Surgeon: Mady Bruckner, MD;  Location: [facility];  Service: Cardiovascular;  Laterality: Left;   NASAL SEPTUM SURGERY     PARATHYROID  EXPLORATION N/A 02/12/2020   Procedure: PARATHYROID  EXPLORATION WITH PARATHROIDECTOMY;  Surgeon: Eletha Boas, MD;  Location: [facility];  Service: General;  Laterality: N/A;    Prior to Admission medications  Medication Sig Start Date End Date Taking? Authorizing Provider  Cholecalciferol (VITAMIN D3) 50 MCG (2000 UT) CAPS Take by mouth. Patient taking differently: Take 1 capsule by mouth daily at 6 (six) AM.   Yes [provider]  Collagen-Vitamin C-Biotin (COLLAGEN PO) Take 2 tablets by mouth daily at 6 (six) AM.   Yes [provider]  fexofenadine (ALLEGRA) 180 MG tablet Take 180 mg by mouth daily.    Yes [provider]  fluticasone  (FLONASE ) 50 MCG/ACT nasal spray SPRAY 2 SPRAYS INTO EACH NOSTRIL EVERY DAY Patient taking differently: Place 1 spray into both nostrils  daily. 12/31/17  Yes Antonio Cyndee Rockers R, DO  losartan  (COZAAR ) 50 MG tablet TAKE 1 TABLET (50 MG) BY MOUTH IN THE MORNING AND AT BEDTIME Patient taking differently: Take 100 mg by mouth daily. 11/20/23  Yes Darliss Rogue, MD  Multiple Vitamins-Minerals (CENTRUM SILVER 50+MEN PO) Take 1 tablet by mouth daily at 6 (six) AM.   Yes [provider]  tadalafil  (CIALIS ) 5 MG tablet Take 1 tablet (5 mg total) by mouth daily. 06/07/23  Yes McGowan, Clotilda LABOR, PA-C  REPATHA  SURECLICK 140 MG/ML SOAJ Inject 140 mg into the skin every 14 (fourteen)  days. 12/31/22   Darliss Rogue, MD  tadalafil  (CIALIS ) 20 MG tablet Take 1 tablet (20 mg total) by mouth daily as needed for erectile dysfunction. Patient not taking: Reported on 03/20/2024 06/07/23   Helon Clotilda LABOR, PA-C    Current Outpatient Medications  Medication Sig Dispense Refill   Cholecalciferol (VITAMIN D3) 50 MCG (2000 UT) CAPS Take by mouth. (Patient taking differently: Take 1 capsule by mouth daily at 6 (six) AM.)     Collagen-Vitamin C-Biotin (COLLAGEN PO) Take 2 tablets by mouth daily at 6 (six) AM.     fexofenadine (ALLEGRA) 180 MG tablet Take 180 mg by mouth daily.      fluticasone  (FLONASE ) 50 MCG/ACT nasal spray SPRAY 2 SPRAYS INTO EACH NOSTRIL EVERY DAY (Patient taking differently: Place 1 spray into both nostrils daily.) 16 g 5   losartan  (COZAAR ) 50 MG tablet TAKE 1 TABLET (50 MG) BY MOUTH IN THE MORNING AND AT BEDTIME (Patient taking differently: Take 100 mg by mouth daily.) 180 tablet 3   Multiple Vitamins-Minerals (CENTRUM SILVER 50+MEN PO) Take 1 tablet by mouth daily at 6 (six) AM.     tadalafil  (CIALIS ) 5 MG tablet Take 1 tablet (5 mg total) by mouth daily. 30 tablet 11   REPATHA  SURECLICK 140 MG/ML SOAJ Inject 140 mg into the skin every 14 (fourteen) days. 2 mL 6   tadalafil  (CIALIS ) 20 MG tablet Take 1 tablet (20 mg total) by mouth daily as needed for erectile dysfunction. (Patient not taking: Reported on 03/20/2024) 30 tablet 3   Current Facility-Administered Medications  Medication Dose Route Frequency Provider Last Rate Last Admin   0.9 %  sodium chloride  infusion  500 mL Intravenous Once Stacia Glendia BRAVO, MD        Allergies as of 03/20/2024 - Review Complete 03/20/2024  Allergen Reaction Noted   Sertraline hcl Other (See Comments) 06/22/2008   Amlodipine  Other (See Comments) 12/01/2018   Crestor [rosuvastatin calcium] Other (See Comments) 08/10/2010   Lipitor [atorvastatin calcium] Other (See Comments) 08/10/2010   Zocor  [simvastatin  - high  dose] Other (See Comments) 01/08/2011    Family History  Problem Relation Age of Onset   Hypertension Mother    Hyperlipidemia Mother    Diabetes Mother    Hypertension Father    Hyperlipidemia Father    Throat cancer Father 47   Hypertension Sister    Colon cancer Brother 65   Colon polyps Brother 28   Esophageal cancer Paternal Uncle    Stomach cancer Neg Hx    Rectal cancer Neg Hx     Social History   Socioeconomic History   Marital status: Married    Spouse name: Not on file   Number of children: Not on file   Years of education: Not on file   Highest education level: Not on file  Occupational History   Not on file  Tobacco Use  Smoking status: Former    Current packs/day: 0.00    Average packs/day: 0.3 packs/day for 35.0 years (8.8 ttl pk-yrs)    Types: Cigarettes    Start date: 10/07/1984    Quit date: 10/08/2019    Years since quitting: 4.4    Passive exposure: Past   Smokeless tobacco: Former    Types: Chew, Snuff   Tobacco comments:    couple of months - 40 yrs ago  Vaping Use   Vaping status: Former  Substance and Sexual Activity   Alcohol use: Yes    Alcohol/week: 24.0 standard drinks of alcohol    Types: 24 Cans of beer per week    Comment: daily    Drug use: No   Sexual activity: Yes    Birth control/protection: None  Other Topics Concern   Not on file  Social History Narrative   Coaches basketball   Social Drivers of Health   Tobacco Use: Medium Risk (03/20/2024)   Patient History    Smoking Tobacco Use: Former    Smokeless Tobacco Use: Former    Passive Exposure: Past  Programmer, Applications: Not on [employer] Insecurity: Not on file  Transportation Needs: Not on file  Physical Activity: Not on file  Stress: Not on file  Social Connections: Not on file  Intimate Partner Violence: Not on file  Depression (PHQ2-9): Low Risk (10/01/2023)   Depression (PHQ2-9)    PHQ-2 Score: 0  Alcohol Screen: Not on file  Housing: Not on file   Utilities: Not on file  Health Literacy: Not on file    Review of Systems:  All other review of systems negative except as mentioned in the HPI.  Physical Exam: Vital signs BP (!) 134/99   Pulse 97   Temp (!) 97.4 F (36.3 C) (Temporal)   Ht 5' 11 (1.803 m)   Wt 205 lb (93 kg)   SpO2 98%   BMI 28.59 kg/m   General:   Alert,  Well-developed, well-nourished, pleasant and cooperative in NAD Airway:  Mallampati 3 Lungs:  Clear throughout to auscultation.   Heart:  Regular rate and rhythm; no murmurs, clicks, rubs,  or gallops. Abdomen:  Soft, nontender and nondistended. Normal bowel sounds.   Neuro/Psych:  Normal mood and affect. A and O x 3   Lamine Laton E. Stacia, MD [facility]

## 2024-03-20 NOTE — Progress Notes (Signed)
 Pt's states no medical or surgical changes since previsit or office visit.

## 2024-03-20 NOTE — Progress Notes (Signed)
 Called to room to assist during endoscopic procedure.  Patient ID and intended procedure confirmed with present staff. Received instructions for my participation in the procedure from the performing physician.

## 2024-03-20 NOTE — Op Note (Signed)
 [facility] Patient Name: Antonio Russell Procedure Date: 03/20/2024 9:01 AM MRN: [id number] Endoscopist: Glendia E. Stacia , MD, [id number] Age: 63 Referring MD:  Date of Birth: [birth year] Gender: Male Account #: [id number] Procedure:                Colonoscopy Indications:              Surveillance: Personal history of adenomatous                            polyps on last colonoscopy 3 years ago, Last                            colonoscopy: November 2021 Medicines:                Monitored Anesthesia Care Procedure:                Pre-Anesthesia Assessment:                           - Prior to the procedure, a History and Physical                            was performed, and patient medications and                            allergies were reviewed. The patient's tolerance of                            previous anesthesia was also reviewed. The risks                            and benefits of the procedure and the sedation                            options and risks were discussed with the patient.                            All questions were answered, and informed consent                            was obtained. Prior Anticoagulants: The patient has                            taken no anticoagulant or antiplatelet agents. ASA                            Grade Assessment: II - A patient with mild systemic                            disease. After reviewing the risks and benefits,                            the patient was deemed in satisfactory condition to  undergo the procedure.                           After obtaining informed consent, the colonoscope                            was passed under direct vision. Throughout the                            procedure, the patient's blood pressure, pulse, and                            oxygen saturations were monitored continuously. The                            CF [id number] #[id number] was introduced  through the anus                            and advanced to the the terminal ileum, with                            identification of the appendiceal orifice and IC                            valve. The colonoscopy was performed without                            difficulty. The patient tolerated the procedure                            well. The quality of the bowel preparation was                            adequate. The terminal ileum, ileocecal valve,                            appendiceal orifice, and rectum were photographed.                            The bowel preparation used was Miralax and SUPREP                            via extended prep with split dose instruction. Scope In: 9:18:24 AM Scope Out: 9:39:49 AM Scope Withdrawal Time: 0 hours 18 minutes 40 seconds  Total Procedure Duration: 0 hours 21 minutes 25 seconds  Findings:                 The perianal and digital rectal examinations were                            normal. Pertinent negatives include normal                            sphincter tone and no palpable rectal lesions.  A 3 mm polyp was found in the cecum. The polyp was                            sessile. The polyp was removed with a cold snare.                            Resection and retrieval were complete. Estimated                            blood loss was minimal.                           A 5 mm polyp was found in the ascending colon. The                            polyp was sessile. The polyp was removed with a                            cold snare. Resection and retrieval were complete.                            Estimated blood loss was minimal.                           A 3 mm polyp was found in the transverse colon. The                            polyp was sessile. The polyp was removed with a                            cold snare. Resection and retrieval were complete.                            Estimated blood loss was  minimal.                           Two sessile polyps were found in the descending                            colon. The polyps were 3 to 4 mm in size. These                            polyps were removed with a cold snare. Resection                            and retrieval were complete. Estimated blood loss                            was minimal.                           A 3 mm polyp was found in the rectum. The polyp was  sessile. The polyp was removed with a cold snare.                            Resection and retrieval were complete. Estimated                            blood loss was minimal.                           The exam was otherwise normal throughout the                            examined colon.                           The terminal ileum appeared normal.                           The retroflexed view of the distal rectum and anal                            verge was normal and showed no anal or rectal                            abnormalities. Complications:            No immediate complications. Estimated Blood Loss:     Estimated blood loss was minimal. Impression:               - One 3 mm polyp in the cecum, removed with a cold                            snare. Resected and retrieved.                           - One 5 mm polyp in the ascending colon, removed                            with a cold snare. Resected and retrieved.                           - One 3 mm polyp in the transverse colon, removed                            with a cold snare. Resected and retrieved.                           - Two 3 to 4 mm polyps in the descending colon,                            removed with a cold snare. Resected and retrieved.                           - One 3 mm polyp in the rectum, removed with a cold  snare. Resected and retrieved.                           - The examined portion of the ileum was normal.                            - The distal rectum and anal verge are normal on                            retroflexion view. Recommendation:           - Patient has a contact number available for                            emergencies. The signs and symptoms of potential                            delayed complications were discussed with the                            patient. Return to normal activities tomorrow.                            Written discharge instructions were provided to the                            patient.                           - Resume previous diet.                           - Continue present medications.                           - Await pathology results.                           - Repeat colonoscopy in 3 years for surveillance. Niyati Heinke E. Stacia, MD 03/20/2024 9:48:39 AM This report has been signed electronically.

## 2024-03-20 NOTE — Progress Notes (Signed)
 Sedate, gd SR, tolerated procedure well, VSS, report to RN

## 2024-03-23 ENCOUNTER — Telehealth: Payer: Self-pay

## 2024-03-23 NOTE — Telephone Encounter (Signed)
" °  Follow up Call-     03/20/2024    8:08 AM  Call back number  Post procedure Call Back phone  # [phone]  Permission to leave phone message Yes     Patient questions:  Do you have a fever, pain , or abdominal swelling? No. Pain Score  0 *  Have you tolerated food without any problems? Yes.    Have you been able to return to your normal activities? Yes.    Do you have any questions about your discharge instructions: Diet   No. Medications  No. Follow up visit  No.  Do you have questions or concerns about your Care? No.  Actions: * If pain score is 4 or above: No action needed, pain <4.   "

## 2024-03-25 LAB — SURGICAL PATHOLOGY

## 2024-03-27 ENCOUNTER — Ambulatory Visit: Payer: Self-pay | Admitting: Gastroenterology

## 2024-03-27 NOTE — Progress Notes (Signed)
 Antonio Russell,   At least 5 polyps that I removed during your recent procedure were completely benign but were proven to be pre-cancerous polyps that MAY have grown into cancers if they had not been removed.  At least one polyp was not precancerous.  Studies shows that at least 20% of women over age 63 and 30% of men over age 47 have pre-cancerous polyps.  Based on current nationally recognized surveillance guidelines, I recommend that you have a repeat colonoscopy in 3 years.   If you develop any new rectal bleeding, abdominal pain or significant bowel habit changes, please contact me before then.

## 2024-04-08 ENCOUNTER — Ambulatory Visit: Payer: Self-pay

## 2024-04-08 NOTE — Telephone Encounter (Signed)
 Aware, will watch for correspondence Thanks to NP Vincente for seeing him

## 2024-04-08 NOTE — Telephone Encounter (Signed)
 FYI Only or Action Required?: FYI only for provider: appointment scheduled on 04/09/24.  Patient was last seen in primary care on 10/01/2023 by Jimmy Charlie FERNS, MD.  Called Nurse Triage reporting Tachycardia, Loss of Vision, Fatigue, and Dizziness, Cough.  Symptoms began one to several weeks ago.  Interventions attempted: Rest, hydration, or home remedies.  Symptoms are: stable.  Triage Disposition: See Physician Within 24 Hours  Patient/caregiver understands and will follow disposition?: Yes            Copied from CRM #[id number]. Topic: Clinical - Red Word Triage >> Apr 08, 2024 10:25 AM Shanda MATSU wrote: Red Word that prompted transfer to Nurse Triage: Patient's wife is reporting patient having a bp of 117/77, heart rate 122 and his vision like tunnel vision Reason for Disposition  [1] MODERATE dizziness (e.g., interferes with normal activities) AND [2] has NOT been evaluated by doctor (or NP/PA) for this  (Exception: Dizziness caused by heat exposure, sudden standing, or poor fluid intake.)  Answer Assessment - Initial Assessment Questions 1. DESCRIPTION: Describe your dizziness.     Weakness/fatigue; lightheaded.  2. LIGHTHEADED: Do you feel lightheaded? (e.g., somewhat faint, woozy, weak upon standing)     Yes.  3. VERTIGO: Do you feel like either you or the room is spinning or tilting? (i.e., vertigo)     No.  4. SEVERITY: How bad is it?  Do you feel like you are going to faint? Can you stand and walk?     Moderate. No falling or LOC.  5. ONSET:  When did the dizziness begin?     X 1 week.  6. AGGRAVATING FACTORS: Does anything make it worse? (e.g., standing, change in head position)     No. He states it will just happen while he is already standing up.  7. HEART RATE: Can you tell me your heart rate? How many beats in 15 seconds?  (Note: Not all patients can do this.)       122 this morning.  8. CAUSE: What do you think is causing the  dizziness? (e.g., decreased fluids or food, diarrhea, emotional distress, heat exposure, new medicine, sudden standing, vomiting; unknown)     Stress.  9. RECURRENT SYMPTOM: Have you had dizziness before? If Yes, ask: When was the last time? What happened that time?     Yes. He states it comes and goes. It has been about 6 months or so since the last episode. It resolved after he starting eating earlier in the day.  10. OTHER SYMPTOMS: Do you have any other symptoms? (e.g., fever, chest pain, vomiting, diarrhea, bleeding)       Patient states last night he was not feeling well, like weakness and then this morning when sitting down he had a temporary loss of peripheral vision tunnel vision that lasted 30 minutes and resolved. He states this has happened before and he was diagnosed with migraines. Occasional productive cough (feels like post nasal drip) x 2 weeks residual from sinus issues 4 weeks ago.No chest pain, SOB, unilateral numbness or weakness, headache, loss or slurred speech, LOC, vomiting, diarrhea, bleeding, fever  Protocols used: Dizziness - Lightheadedness-A-AH

## 2024-04-09 ENCOUNTER — Encounter: Payer: Self-pay | Admitting: General Practice

## 2024-04-09 ENCOUNTER — Ambulatory Visit: Payer: Self-pay | Admitting: General Practice

## 2024-04-09 ENCOUNTER — Ambulatory Visit: Admitting: General Practice

## 2024-04-09 VITALS — BP 120/82 | HR 109 | Temp 97.8°F | Ht 71.0 in | Wt 205.0 lb

## 2024-04-09 DIAGNOSIS — R051 Acute cough: Secondary | ICD-10-CM | POA: Diagnosis not present

## 2024-04-09 DIAGNOSIS — F418 Other specified anxiety disorders: Secondary | ICD-10-CM | POA: Diagnosis not present

## 2024-04-09 DIAGNOSIS — R0683 Snoring: Secondary | ICD-10-CM | POA: Diagnosis not present

## 2024-04-09 DIAGNOSIS — R000 Tachycardia, unspecified: Secondary | ICD-10-CM

## 2024-04-09 DIAGNOSIS — R42 Dizziness and giddiness: Secondary | ICD-10-CM | POA: Insufficient documentation

## 2024-04-09 LAB — TSH: TSH: 1.02 u[IU]/mL (ref 0.35–5.50)

## 2024-04-09 LAB — CBC WITH DIFFERENTIAL/PLATELET
Basophils Absolute: 0.1 K/uL (ref 0.0–0.1)
Basophils Relative: 1 % (ref 0.0–3.0)
Eosinophils Absolute: 0.4 K/uL (ref 0.0–0.7)
Eosinophils Relative: 3.9 % (ref 0.0–5.0)
HCT: 44.4 % (ref 39.0–52.0)
Hemoglobin: 15 g/dL (ref 13.0–17.0)
Lymphocytes Relative: 31.6 % (ref 12.0–46.0)
Lymphs Abs: 2.9 K/uL (ref 0.7–4.0)
MCHC: 33.8 g/dL (ref 30.0–36.0)
MCV: 98.4 fl (ref 78.0–100.0)
Monocytes Absolute: 1.1 K/uL — ABNORMAL HIGH (ref 0.1–1.0)
Monocytes Relative: 12.2 % — ABNORMAL HIGH (ref 3.0–12.0)
Neutro Abs: 4.7 K/uL (ref 1.4–7.7)
Neutrophils Relative %: 51.3 % (ref 43.0–77.0)
Platelets: 287 K/uL (ref 150.0–400.0)
RBC: 4.52 Mil/uL (ref 4.22–5.81)
RDW: 12.9 % (ref 11.5–15.5)
WBC: 9.2 K/uL (ref 4.0–10.5)

## 2024-04-09 LAB — COMPREHENSIVE METABOLIC PANEL WITH GFR
ALT: 23 U/L (ref 3–53)
AST: 20 U/L (ref 5–37)
Albumin: 4.2 g/dL (ref 3.5–5.2)
Alkaline Phosphatase: 69 U/L (ref 39–117)
BUN: 16 mg/dL (ref 6–23)
CO2: 30 meq/L (ref 19–32)
Calcium: 9.7 mg/dL (ref 8.4–10.5)
Chloride: 100 meq/L (ref 96–112)
Creatinine, Ser: 1.09 mg/dL (ref 0.40–1.50)
GFR: 72.33 mL/min
Glucose, Bld: 102 mg/dL — ABNORMAL HIGH (ref 70–99)
Potassium: 4.8 meq/L (ref 3.5–5.1)
Sodium: 135 meq/L (ref 135–145)
Total Bilirubin: 0.3 mg/dL (ref 0.2–1.2)
Total Protein: 7 g/dL (ref 6.0–8.3)

## 2024-04-09 LAB — VITAMIN D 25 HYDROXY (VIT D DEFICIENCY, FRACTURES): VITD: 28.84 ng/mL — ABNORMAL LOW (ref 30.00–100.00)

## 2024-04-09 LAB — HEMOGLOBIN A1C: Hgb A1c MFr Bld: 6.3 % (ref 4.6–6.5)

## 2024-04-09 LAB — VITAMIN B12: Vitamin B-12: 878 pg/mL (ref 211–911)

## 2024-04-09 MED ORDER — HYDROXYZINE PAMOATE 25 MG PO CAPS: 25.0000 mg | ORAL_CAPSULE | Freq: Three times a day (TID) | ORAL | 0 refills | Status: AC | PRN

## 2024-04-09 NOTE — Progress Notes (Signed)
 "  Established Patient Office Visit  Subjective   Patient ID: Antonio Russell, male    DOB: Nov 26, 1960  Age: 64 y.o. MRN: [id number]  Chief Complaint  Patient presents with   Cough    With Lightheaded intermittently x several months but has gotten worse in the last month; HR 122 this AM (BP 117/77), generalized fatigue. Patient has had cough x 1 month; patient states this has been going on since he was sick. Patient states he did not have a cough when he was sick just after he was sick. Patient states his BP has been high and low when he checks it. Pulse rate has been in the 100's when he takes it x several days. Denies chest pain and SOB.     Cough Pertinent negatives include no chest pain, chills, fever, headaches, heartburn or shortness of breath.   Antonio Russell is a 64 year old male, patient of Dr. Randeen, presents today for an acute visit.   Discussed the use of AI scribe software for clinical note transcription with the patient, who gave verbal consent to proceed.  History of Present Illness He has been experiencing dizziness and an elevated heart rate, which have worsened over the past several months. His heart rate was recorded at 122 beats per minute, and his blood pressure was 102/52 mmHg. These symptoms have been persistent, with episodes of tunnel vision occurring intermittently for years, including recent occurrences. No fever, chills, ear pain, sinus pain, persistent cough, urinary symptoms, or gastrointestinal symptoms. He experiences occasional lightheadedness and fatigue.  He describes a recent sinus infection about a month ago, which resolved without a persistent cough. However, he experiences occasional throat clearing, which he attributes to sinus drainage.  He is under significant stress due to his wife's breast cancer diagnosis and treatment over the past seven months, coupled with working over 60 hours a week. He attributes some of his symptoms to stress and lack of  sleep.  He has a history of sleep apnea, diagnosed during a colonoscopy about a month ago, where the anesthesiologist noted airway issues. He does not currently use a CPAP machine. He reports significant snoring, as noted by his wife.  He has a history of prediabetes, with an A1c checked in February, and follows up with endocrinology for high blood calcium and a past parathyroidectomy. He also had a thyroid  nodule biopsied last year.    Patient Active Problem List   Diagnosis Date Noted   Acute cough 04/09/2024   Snoring 04/09/2024   Dizziness 04/09/2024   Situational anxiety 04/09/2024   Sore throat 04/20/2021   Pressure sensation in both ears 04/20/2021   Sinus pressure 04/20/2021   Stable angina 06/14/2020   Abnormal cardiac CT angiography 06/14/2020   Multiple thyroid  nodules 01/05/2020   Carotid stenosis 12/25/2019   AAA (abdominal aortic aneurysm) without rupture 07/14/2019   Atherosclerosis of native arteries of extremity with intermittent claudication 07/14/2019   Multiple joint pain 05/01/2019   Primary hyperparathyroidism 01/14/2019   Vitamin D  deficiency 01/14/2019   Left knee pain 12/01/2018   Left foot pain 12/01/2018   Chronic daily headache 12/06/2017   Hypercalcemia 11/08/2017   Headache 11/04/2017   External hemorrhoid 11/05/2016   Alcohol use 11/04/2016   Prediabetes 11/02/2016   Skin lesion of left leg 11/02/2016   Routine general medical examination at a health care facility 10/25/2016   Prostate cancer screening 10/25/2016   Leukocytosis 11/30/2014   RUQ pain 11/30/2014   Special screening  for malignant neoplasms, colon 02/16/2011   Tachycardia 08/10/2010   Arthritis    Essential hypertension 07/13/2010   CAD (coronary artery disease)    Testosterone  deficiency 05/25/2009   Hyperlipidemia 05/25/2009   ERECTILE DYSFUNCTION, 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 2012 Cath: nonobstructive, nl LV fxn, rec aggressive med management; b. 05/2020 Cath/PCI: LM nl, LAD 8m, RI mod diff dzs, LCX 65p/m, OM3 20, RCA 30p, 71m (2.5x12 Resolute Onyx DES).   GERD (gastroesophageal reflux disease)    Headache(784.0)    History of kidney stones    HLD (hyperlipidemia)    Hyperparathyroidism    Hypertension    Impotence of organic origin    Other testicular hypofunction    Peripheral vascular disease    Personal history of urinary calculi    Sleep apnea    no c-pap or bi-pap   Thyroid  disease    Hyperparathyroidism   Tobacco use disorder    Unspecified hearing loss    Past Surgical History:  Procedure Laterality Date   CARDIAC CATHETERIZATION     COLONOSCOPY  02/2020   MS-MAC-sutab (adequate after lavage)-TA   CORONARY STENT INTERVENTION N/A 06/14/2020   Procedure: CORONARY STENT INTERVENTION;  Surgeon: Mady Bruckner, MD;  Location: [facility];  Service: Cardiovascular;  Laterality: N/A;   ENDOVASCULAR STENT GRAFT (AAA) N/A 08/12/2019   Procedure: ENDOVASCULAR REPAIR/STENT GRAFT;  Surgeon: Marea Selinda RAMAN, MD;  Location: [facility];  Service: Cardiovascular;  Laterality: N/A;   EXTRACORPOREAL SHOCK WAVE LITHOTRIPSY Right 10/29/2019   Procedure: EXTRACORPOREAL SHOCK WAVE LITHOTRIPSY (ESWL);  Surgeon: Francisca Redell BROCKS, MD;  Location: [facility];  Service: Urology;  Laterality: Right;   HEMORRHOID SURGERY     HEMORRHOID SURGERY     KNEE SURGERY Right 2013   LEFT HEART CATH AND CORONARY ANGIOGRAPHY Left 06/14/2020   Procedure: LEFT HEART CATH AND CORONARY ANGIOGRAPHY;  Surgeon: Mady Bruckner, MD;  Location: [facility];  Service: Cardiovascular;  Laterality: Left;   NASAL SEPTUM SURGERY     PARATHYROID  EXPLORATION N/A 02/12/2020   Procedure: PARATHYROID  EXPLORATION WITH PARATHROIDECTOMY;   Surgeon: Eletha Boas, MD;  Location: [facility];  Service: General;  Laterality: N/A;   Allergies[1]       10/01/2023    9:44 AM 01/13/2019    3:08 PM 12/06/2017    9:35 AM  Depression screen PHQ 2/9  Decreased Interest 0 0 2  Down, Depressed, Hopeless 0 0 0  PHQ - 2 Score 0 0 2  Altered sleeping  1 0  Tired, decreased energy  1 2  Change in appetite  0 0  Feeling bad or failure about yourself   0 0  Trouble concentrating  0 0  Moving slowly or fidgety/restless  0 0  Suicidal thoughts  0 0  PHQ-9 Score  2  4   Difficult doing work/chores  Not difficult at all      Data saved with a previous flowsheet row definition        No data to display            Review of Systems  Constitutional:  Negative for chills and fever.  Respiratory:  Positive for  cough. Negative for shortness of breath.   Cardiovascular:  Negative for chest pain.  Gastrointestinal:  Negative for abdominal pain, constipation, diarrhea, heartburn, nausea and vomiting.  Genitourinary:  Negative for dysuria, frequency and urgency.  Neurological:  Negative for dizziness and headaches.  Endo/Heme/Allergies:  Negative for polydipsia.  Psychiatric/Behavioral:  Negative for depression and suicidal ideas. The patient is not nervous/anxious.       Objective:     BP 120/82   Pulse (!) 109   Temp 97.8 F (36.6 C) (Temporal)   Ht 5' 11 (1.803 m)   Wt 205 lb (93 kg)   SpO2 97%   BMI 28.59 kg/m  BP Readings from Last 3 Encounters:  04/09/24 120/82  03/20/24 103/73  10/01/23 138/86   Wt Readings from Last 3 Encounters:  04/09/24 205 lb (93 kg)  03/20/24 205 lb (93 kg)  03/06/24 205 lb (93 kg)      Physical Exam Vitals and nursing note reviewed.  Constitutional:      Appearance: Normal appearance.  HENT:     Right Ear: Tympanic membrane, ear canal and external ear normal.     Left Ear: Tympanic membrane, ear canal and external ear normal.     Nose: Nose normal.     Mouth/Throat:     Pharynx:  Oropharynx is clear.  Eyes:     Conjunctiva/sclera: Conjunctivae normal.  Cardiovascular:     Rate and Rhythm: Normal rate and regular rhythm.     Pulses: Normal pulses.     Heart sounds: Normal heart sounds.  Pulmonary:     Effort: Pulmonary effort is normal.     Breath sounds: Normal breath sounds.  Skin:    General: Skin is warm.  Neurological:     General: No focal deficit present.     Mental Status: He is alert and oriented to person, place, and time.     Cranial Nerves: Cranial nerves 2-12 are intact.     Sensory: Sensation is intact.     Motor: Motor function is intact.     Coordination: Coordination is intact.     Gait: Gait is intact.  Psychiatric:        Mood and Affect: Mood normal.        Behavior: Behavior normal.        Thought Content: Thought content normal.        Judgment: Judgment normal.      No results found for any visits on 04/09/24.     The 10-year ASCVD risk score (Arnett DK, et al., 2019) is: 8.4%    Assessment & Plan:  Acute cough  Snoring -     Pulmonary Visit  Dizziness -     CBC with Differential/Platelet -     Comprehensive metabolic panel with GFR -     EKG 12-Lead -     Hemoglobin A1c -     VITAMIN D  25 Hydroxy (Vit-D Deficiency, Fractures) -     Vitamin B12  Tachycardia -     CBC with Differential/Platelet -     Comprehensive metabolic panel with GFR -     EKG 12-Lead -     TSH -     Hemoglobin A1c  Situational anxiety -     hydrOXYzine  Pamoate; Take 1 capsule (25 mg total) by mouth every 8 (eight) hours as needed for anxiety.  Dispense: 30 capsule; Refill: 0    Assessment and Plan Assessment & Plan Dizziness and tachycardia Intermittent dizziness and  tachycardia with low blood pressure and elevated heart rate. Differential includes stress, sleep apnea, and sinus infection. No clear etiology identified. - neuro exam stable. No red flags on exam today. Stable for out patient treatment. - Ordered blood work for kidney  function, liver function, blood counts, thyroid  function, A1c, vitamin D , and B12. - EKG tracing is personally reviewed.  EKG notes NSR.  No acute changes. Compared to previous EKG in June, 2025. - Instructed to go to ER if symptoms worsen, heart rate increases significantly, or if experiencing chest pain, shortness of breath, or neurological symptoms. - await results. F/u in one week (may cancel if better).  Obstructive sleep apnea with snoring Diagnosed with obstructive sleep apnea, not using CPAP. Reports snoring and stress-related sleep disturbances. - Referred to pulmonology for a home sleep study to evaluate CPAP therapy need.  Situational anxiety Experiencing situational anxiety related to personal stressors. Prefers to avoid benzodiazepines. - Prescribed hydroxyzine  25 mg as needed for anxiety, cautioning potential drowsiness.   Return in about 1 week (around 04/16/2024) for dizziness f/u.    Carrol Aurora, NP     [1]  Allergies Allergen Reactions   Sertraline Hcl Other (See Comments)    Tremors   Amlodipine  Other (See Comments)    Sexual side effects   Crestor [Rosuvastatin Calcium] Other (See Comments)    Myalgia   Lipitor [Atorvastatin Calcium] Other (See Comments)    Myalgia   Zocor  [Simvastatin  - High Dose] Other (See Comments)    Myalgia    "

## 2024-04-09 NOTE — Patient Instructions (Addendum)
 Stop by the lab prior to leaving today. I will notify you of your results once received.    You will either be contacted via phone regarding your referral to pulmonology , or you may receive a letter on your MyChart portal from our referral team with instructions for scheduling an appointment. Please let us  know if you have not been contacted by anyone within two weeks.   Start hydroxyzine  25 mg as needed for anxiety. This can make you sleepy.   Follow up with Dr. Randeen in one week if symptoms worsen or do not improve.   Please go to the ER as discussed.   It was a pleasure to see you today!

## 2024-04-17 ENCOUNTER — Ambulatory Visit (INDEPENDENT_AMBULATORY_CARE_PROVIDER_SITE_OTHER): Admitting: Family Medicine

## 2024-04-17 ENCOUNTER — Ambulatory Visit: Admitting: Sleep Medicine

## 2024-04-17 ENCOUNTER — Encounter: Payer: Self-pay | Admitting: Family Medicine

## 2024-04-17 ENCOUNTER — Encounter: Payer: Self-pay | Admitting: Sleep Medicine

## 2024-04-17 VITALS — BP 130/80 | HR 103 | Temp 99.2°F | Ht 71.0 in | Wt 209.0 lb

## 2024-04-17 VITALS — BP 133/81 | HR 87 | Temp 98.3°F | Ht 71.0 in | Wt 208.4 lb

## 2024-04-17 DIAGNOSIS — F418 Other specified anxiety disorders: Secondary | ICD-10-CM

## 2024-04-17 DIAGNOSIS — F430 Acute stress reaction: Secondary | ICD-10-CM | POA: Diagnosis not present

## 2024-04-17 DIAGNOSIS — I10 Essential (primary) hypertension: Secondary | ICD-10-CM

## 2024-04-17 DIAGNOSIS — M25532 Pain in left wrist: Secondary | ICD-10-CM

## 2024-04-17 DIAGNOSIS — G4710 Hypersomnia, unspecified: Secondary | ICD-10-CM

## 2024-04-17 DIAGNOSIS — R42 Dizziness and giddiness: Secondary | ICD-10-CM | POA: Diagnosis not present

## 2024-04-17 DIAGNOSIS — Z87891 Personal history of nicotine dependence: Secondary | ICD-10-CM

## 2024-04-17 DIAGNOSIS — G4733 Obstructive sleep apnea (adult) (pediatric): Secondary | ICD-10-CM

## 2024-04-17 MED ORDER — MELOXICAM 15 MG PO TABS: 15.0000 mg | ORAL_TABLET | Freq: Every day | ORAL | 0 refills | Status: AC | PRN

## 2024-04-17 NOTE — Progress Notes (Signed)
 "      Name:Antonio Russell MRN: [id number] DOB: May 07, 1960   CHIEF COMPLAINT:  EXCESSIVE DAYTIME SLEEPINESS   HISTORY OF PRESENT ILLNESS: Antonio Russell is Russell 64 y.o. w/ Russell h/o CAD and HTN who presents for c/o loud snoring, witnessed apnea and excessive daytime sleepiness which has been present for several years. Reports nocturnal awakenings due to unclear reasons, however does not have difficulty falling back to sleep. Denies any significant weight changes. Denies morning headaches, RLS symptoms, dream enactment, cataplexy, hypnagogic or hypnapompic hallucinations. Reports Russell family history of sleep apnea. Denies drowsy driving. Drinks 1 cup of coffee daily, drinks 3-4 beers nightly, denies tobacco or illicit drug use.   Bedtime 9 pm Sleep onset 10 mins Rise time 5 am   EPWORTH SLEEP SCORE 10    04/17/2024    9:00 AM  Results of the Epworth flowsheet  Sitting and reading 3  Watching TV 3  Sitting, inactive in Russell public place (e.g. Russell theatre or Russell meeting) 0  As Russell passenger in Russell car for an hour without Russell break 0  Lying down to rest in the afternoon when circumstances permit 3  Sitting and talking to someone 0  Sitting quietly after Russell lunch without alcohol 1  In Russell car, while stopped for Russell few minutes in traffic 0  Total score 10    PAST MEDICAL HISTORY :   has Russell past medical history of Allergic rhinitis, Allergy, Anxiety, Arthritis, Back pain, CAD (coronary artery disease), GERD (gastroesophageal reflux disease), Headache(784.0), History of kidney stones, HLD (hyperlipidemia), Hyperparathyroidism, Hypertension, Impotence of organic origin, Other testicular hypofunction, Peripheral vascular disease, Personal history of urinary calculi, Sleep apnea, Thyroid  disease, Tobacco use disorder, and Unspecified hearing loss.  has Russell past surgical history that includes Colonoscopy (02/2020); Hemorrhoid surgery; Nasal septum surgery; Knee surgery (Right, 2013); ENDOVASCULAR STENT GRAFT (AAA) (N/Russell,  08/12/2019); Extracorporeal shock wave lithotripsy (Right, 10/29/2019); Hemorrhoid surgery; Cardiac catheterization; Parathyroid  exploration (N/Russell, 02/12/2020); LEFT HEART CATH AND CORONARY ANGIOGRAPHY (Left, 06/14/2020); and CORONARY STENT INTERVENTION (N/Russell, 06/14/2020). Prior to Admission medications  Medication Sig Start Date End Date Taking? Authorizing Provider  Cholecalciferol (VITAMIN D3) 50 MCG (2000 UT) CAPS Take by mouth. Patient taking differently: Take 1 capsule by mouth daily at 6 (six) AM.   Yes [provider]  Collagen-Vitamin C-Biotin (COLLAGEN PO) Take 2 tablets by mouth daily at 6 (six) AM.   Yes [provider]  fexofenadine (ALLEGRA) 180 MG tablet Take 180 mg by mouth daily.    Yes [provider]  fluticasone  (FLONASE ) 50 MCG/ACT nasal spray SPRAY 2 SPRAYS INTO EACH NOSTRIL EVERY DAY Patient taking differently: Place 1 spray into both nostrils daily. 12/31/17  Yes Antonio Cyndee Rockers R, DO  hydrOXYzine  (VISTARIL ) 25 MG capsule Take 1 capsule (25 mg total) by mouth every 8 (eight) hours as needed for anxiety. 04/09/24  Yes Antonio Shivers, NP  losartan  (COZAAR ) 50 MG tablet TAKE 1 TABLET (50 MG) BY MOUTH IN THE MORNING AND AT BEDTIME Patient taking differently: Take 100 mg by mouth daily. 11/20/23  Yes Antonio Rogue, MD  Multiple Vitamins-Minerals (CENTRUM SILVER 50+MEN PO) Take 1 tablet by mouth daily at 6 (six) AM.   Yes [provider]  REPATHA  SURECLICK 140 MG/ML SOAJ Inject 140 mg into the skin every 14 (fourteen) days. 12/31/22  Yes Antonio Rogue, MD  tadalafil  (CIALIS ) 20 MG tablet Take 1 tablet (20 mg total) by mouth daily as needed for erectile dysfunction. 06/07/23  Yes  Antonio Antonio A, PA-C  tadalafil  (CIALIS ) 5 MG tablet Take 1 tablet (5 mg total) by mouth daily. 06/07/23  Yes McGowan, Antonio A, PA-C   Allergies[1]  FAMILY HISTORY:  family history includes Colon cancer (age of onset: 73) in his brother; Colon polyps (age of  onset: 65) in his brother; Diabetes in his mother; Esophageal cancer in his paternal uncle; Hyperlipidemia in his father and mother; Hypertension in his father, mother, and sister; Throat cancer (age of onset: 87) in his father. SOCIAL HISTORY:  reports that he quit smoking about 4 years ago. His smoking use included cigarettes. He started smoking about 39 years ago. He has Russell 8.8 pack-year smoking history. He has been exposed to tobacco smoke. He has quit using smokeless tobacco.  His smokeless tobacco use included chew and snuff. He reports current alcohol use of about 24.0 standard drinks of alcohol per week. He reports that he does not use drugs.   Review of Systems:  Gen:  Denies  fever, sweats, chills weight loss  HEENT: Denies blurred vision, double vision, ear pain, eye pain, hearing loss, nose bleeds, sore throat Cardiac:  No dizziness, chest pain or heaviness, chest tightness,edema, No JVD Resp:   No cough, -sputum production, -shortness of breath,-wheezing, -hemoptysis,  Gi: Denies swallowing difficulty, stomach pain, nausea or vomiting, diarrhea, constipation, bowel incontinence Gu:  Denies bladder incontinence, burning urine Ext:   Denies Joint pain, stiffness or swelling Skin: Denies  skin rash, easy bruising or bleeding or hives Endoc:  Denies polyuria, polydipsia , polyphagia or weight change Psych:   Denies depression, insomnia or hallucinations  Other:  All other systems negative  VITAL SIGNS: BP 130/80   Pulse (!) 103   Temp 99.2 F (37.3 C)   Ht 5' 11 (1.803 m)   Wt 209 lb (94.8 kg)   SpO2 96%   BMI 29.15 kg/m     Physical Examination:   General Appearance: No distress  EYES PERRLA, EOM intact.   NECK Supple, No JVD Pulmonary: normal breath sounds, No wheezing.  CardiovascularNormal S1,S2.  No m/Russell/g.   Abdomen: Benign, Soft, non-tender. Skin:   warm, no rashes, no ecchymosis  Extremities: normal, no cyanosis, clubbing. Neuro:without focal findings,  speech  normal  PSYCHIATRIC: Mood, affect within normal limits.   ASSESSMENT AND PLAN  OSA I suspect that OSA is likely present due to clinical presentation. Discussed the consequences of untreated sleep apnea. Advised not to drive drowsy for safety of patient and others. Will complete further evaluation with Russell home sleep study and follow up to review results.    HTN Stable, on current management. Following with PCP.    MEDICATION ADJUSTMENTS/LABS AND TESTS ORDERED: Recommend Sleep Study   Patient  satisfied with Plan of action and management. All questions answered  Follow up to review HST results and treatment plan.   I spent Russell total of 47 minutes reviewing chart data, face-to-face evaluation with the patient, counseling and coordination of care as detailed above.    Thia Olesen, M.D.  Sleep Medicine [facility]           [1]  Allergies Allergen Reactions   Sertraline Hcl Other (See Comments)    Tremors   Amlodipine  Other (See Comments)    Sexual side effects   Crestor [Rosuvastatin Calcium] Other (See Comments)    Myalgia   Lipitor [Atorvastatin Calcium] Other (See Comments)    Myalgia   Zocor  [Simvastatin  - High Dose] Other (See  Comments)    Myalgia    "

## 2024-04-17 NOTE — Progress Notes (Unsigned)
 "  Subjective:    Patient ID: Antonio Russell, male    DOB: November 27, 1960, 64 y.o.   MRN: [id number]  HPI  Wt Readings from Last 3 Encounters:  04/17/24 208 lb 6 oz (94.5 kg)  04/17/24 209 lb (94.8 kg)  04/09/24 205 lb (93 kg)   29.06 kg/m  Vitals:   04/17/24 1356 04/17/24 1420  BP: (!) 144/90 133/81  Pulse: 87   Temp: 98.3 F (36.8 C)   SpO2: 98%      Pt presents for follow up of  Dizziness  New left wrist pain (mentions at end of visit)  Stress reaction   Woke up with wrist pain  Shooting pain  Proximal to thumb Hurts to pronate  This happens periodically  Was seen by NP Vincente a week ago   Per note Dizziness and tachycardia Intermittent dizziness and tachycardia with low blood pressure and elevated heart rate. Differential includes stress, sleep apnea, and sinus infection. No clear etiology identified. - neuro exam stable. No red flags on exam today. Stable for out patient treatment. - Ordered blood work for kidney function, liver function, blood counts, thyroid  function, A1c, vitamin D , and B12. - EKG tracing is personally reviewed.  EKG notes NSR.  No acute changes. Compared to previous EKG in June, 2025. - Instructed to go to ER if symptoms worsen, heart rate increases significantly, or if experiencing chest pain, shortness of breath, or neurological symptoms. - await results. F/u in one week (may cancel if better).  Noted more situational anxiety also  Was prescribed hydroxyzine  25 mg    Lab Results  Component Value Date   HGBA1C 6.3 04/09/2024   Lab Results  Component Value Date   WBC 9.2 04/09/2024   HGB 15.0 04/09/2024   HCT 44.4 04/09/2024   MCV 98.4 04/09/2024   PLT 287.0 04/09/2024   Lab Results  Component Value Date   NA 135 04/09/2024   K 4.8 04/09/2024   CO2 30 04/09/2024   GLUCOSE 102 (H) 04/09/2024   BUN 16 04/09/2024   CREATININE 1.09 04/09/2024   CALCIUM 9.7 04/09/2024   GFR 72.33 04/09/2024   GFRNONAA >60 05/30/2023   Lab Results   Component Value Date   ALT 23 04/09/2024   AST 20 04/09/2024   ALKPHOS 69 04/09/2024   BILITOT 0.3 04/09/2024   Lab Results  Component Value Date   TSH 1.02 04/09/2024   Last vitamin D  Lab Results  Component Value Date   VD25OH 28.84 (L) 04/09/2024   Was given prescription ergocalciverol 50,00 international units weekly His endocrinologist wants him to take over the counter instead Has appointment in feb    Lab Results  Component Value Date   VITAMINB12 878 04/09/2024   Did see pulmonary today for suspected sleep apnea  Hope sleep study ordered  That note did mention that pt drinks 3-4 beers nightly    Today- feels better  Improved  No longer light headheaded or spinning  Non heart palpitations    Stress Wife going through breast cancer but she is doing well  Working 60 h per week also  Is thinking about retirement earlier -is getting harder    'HTN bp is stable today  No cp or palpitations or headaches or edema  No side effects to medicines  BP Readings from Last 3 Encounters:  04/17/24 133/81  04/17/24 130/80  04/09/24 120/82     Lab Results  Component Value Date   NA 135 04/09/2024  K 4.8 04/09/2024   CO2 30 04/09/2024   GLUCOSE 102 (H) 04/09/2024   BUN 16 04/09/2024   CREATININE 1.09 04/09/2024   CALCIUM 9.7 04/09/2024   GFR 72.33 04/09/2024   GFRNONAA >60 05/30/2023   Losartan  50 mg bid       04/17/2024    2:03 PM 10/01/2023    9:44 AM 01/13/2019    3:08 PM 12/06/2017    9:35 AM 11/02/2016    3:24 PM  Depression screen PHQ 2/9  Decreased Interest 2 0 0 2   Down, Depressed, Hopeless 2 0 0 0 0  PHQ - 2 Score 4 0 0 2 0  Altered sleeping 2  1 0   Tired, decreased energy 3  1 2    Change in appetite 0  0 0   Feeling bad or failure about yourself  0  0 0   Trouble concentrating 0  0 0   Moving slowly or fidgety/restless 0  0 0   Suicidal thoughts 0  0 0   PHQ-9 Score 9  2  4     Difficult doing work/chores Somewhat difficult  Not difficult  at all       Data saved with a previous flowsheet row definition      04/17/2024    2:03 PM  GAD 7 : Generalized Anxiety Score  Nervous, Anxious, on Edge 1  Control/stop worrying 1  Worry too much - different things 1  Trouble relaxing 0  Restless 0  Easily annoyed or irritable 2  Afraid - awful might happen 1  Total GAD 7 Score 6  Anxiety Difficulty Not difficult at all     Patient Active Problem List   Diagnosis Date Noted   Left wrist pain 04/17/2024   Stress reaction 04/17/2024   Acute cough 04/09/2024   Snoring 04/09/2024   Dizziness 04/09/2024   Situational anxiety 04/09/2024   Pressure sensation in both ears 04/20/2021   Abnormal cardiac CT angiography 06/14/2020   Multiple thyroid  nodules 01/05/2020   Carotid stenosis 12/25/2019   AAA (abdominal aortic aneurysm) without rupture 07/14/2019   Atherosclerosis of native arteries of extremity with intermittent claudication 07/14/2019   Multiple joint pain 05/01/2019   Primary hyperparathyroidism 01/14/2019   Vitamin D  deficiency 01/14/2019   Left knee pain 12/01/2018   Left foot pain 12/01/2018   Chronic daily headache 12/06/2017   Hypercalcemia 11/08/2017   Headache 11/04/2017   External hemorrhoid 11/05/2016   Alcohol use 11/04/2016   Prediabetes 11/02/2016   Skin lesion of left leg 11/02/2016   Routine general medical examination at a health care facility 10/25/2016   Prostate cancer screening 10/25/2016   Leukocytosis 11/30/2014   RUQ pain 11/30/2014   Special screening for malignant neoplasms, colon 02/16/2011   Tachycardia 08/10/2010   Arthritis    Essential hypertension 07/13/2010   CAD (coronary artery disease)    Testosterone  deficiency 05/25/2009   Hyperlipidemia 05/25/2009   ERECTILE DYSFUNCTION, 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 2012 Cath: nonobstructive, nl LV fxn, rec aggressive med management; b. 05/2020 Cath/PCI: LM nl, LAD 24m, RI mod diff dzs, LCX 65p/m, OM3 20, RCA 30p, 80m (2.5x12 Resolute Onyx DES).   GERD (gastroesophageal reflux disease)  Headache(784.0)    History of kidney stones    HLD (hyperlipidemia)    Hyperparathyroidism    Hypertension    Impotence of organic origin    Other testicular hypofunction    Peripheral vascular disease    Personal history of urinary calculi    Sleep apnea    no c-pap or bi-pap   Thyroid  disease    Hyperparathyroidism   Tobacco use disorder    Unspecified hearing loss    Past Surgical History:  Procedure Laterality Date   CARDIAC CATHETERIZATION     COLONOSCOPY  02/2020   MS-MAC-sutab (adequate after lavage)-TA   CORONARY STENT INTERVENTION N/A 06/14/2020   Procedure: CORONARY STENT INTERVENTION;  Surgeon: Mady Bruckner, MD;  Location: [facility];  Service: Cardiovascular;  Laterality: N/A;   ENDOVASCULAR STENT GRAFT (AAA) N/A 08/12/2019   Procedure: ENDOVASCULAR REPAIR/STENT GRAFT;  Surgeon: Marea Selinda RAMAN, MD;  Location: [facility];  Service: Cardiovascular;  Laterality: N/A;   EXTRACORPOREAL SHOCK WAVE LITHOTRIPSY Right 10/29/2019   Procedure: EXTRACORPOREAL SHOCK WAVE LITHOTRIPSY (ESWL);  Surgeon: Francisca Redell BROCKS, MD;  Location: [facility];  Service: Urology;  Laterality: Right;   HEMORRHOID SURGERY     HEMORRHOID SURGERY     KNEE SURGERY Right 2013   LEFT HEART CATH AND CORONARY ANGIOGRAPHY Left 06/14/2020   Procedure: LEFT HEART CATH AND CORONARY ANGIOGRAPHY;  Surgeon: Mady Bruckner, MD;  Location: [facility];  Service: Cardiovascular;  Laterality: Left;   NASAL SEPTUM SURGERY     PARATHYROID  EXPLORATION N/A 02/12/2020   Procedure: PARATHYROID  EXPLORATION WITH PARATHROIDECTOMY;  Surgeon: Eletha Boas, MD;  Location: [facility];  Service: General;  Laterality: N/A;   Social  History[1] Family History  Problem Relation Age of Onset   Hypertension Mother    Hyperlipidemia Mother    Diabetes Mother    Hypertension Father    Hyperlipidemia Father    Throat cancer Father 71   Hypertension Sister    Colon cancer Brother 62   Colon polyps Brother 69   Esophageal cancer Paternal Uncle    Stomach cancer Neg Hx    Rectal cancer Neg Hx    Allergies[2] Medications Ordered Prior to Encounter[3]  Review of Systems  Constitutional:  Positive for fatigue. Negative for activity change, appetite change, fever and unexpected weight change.  HENT:  Negative for congestion, rhinorrhea, sore throat and trouble swallowing.   Eyes:  Negative for pain, redness, itching and visual disturbance.  Respiratory:  Negative for cough, chest tightness, shortness of breath and wheezing.   Cardiovascular:  Negative for chest pain and palpitations.  Gastrointestinal:  Negative for abdominal pain, blood in stool, constipation, diarrhea and nausea.  Endocrine: Negative for cold intolerance, heat intolerance, polydipsia and polyuria.  Genitourinary:  Negative for difficulty urinating, dysuria, frequency and urgency.  Musculoskeletal:  Positive for arthralgias. Negative for joint swelling and myalgias.  Skin:  Negative for pallor and rash.  Neurological:  Negative for dizziness, tremors, seizures, syncope, facial asymmetry, speech difficulty, weakness, light-headedness, numbness and headaches.  Hematological:  Negative for adenopathy. Does not bruise/bleed easily.  Psychiatric/Behavioral:  Negative for decreased concentration and dysphoric mood. The patient is not nervous/anxious.        Objective:   Physical Exam Constitutional:      General: He is not in acute distress.    Appearance: Normal appearance. He is well-developed. He is not ill-appearing or diaphoretic.     Comments: Overweight   HENT:     Head: Normocephalic  and atraumatic.     Right Ear: Tympanic membrane and ear canal  normal.     Left Ear: Tympanic membrane and ear canal normal.     Nose: Nose normal.     Mouth/Throat:     Mouth: Mucous membranes are moist.     Pharynx: Oropharynx is clear.  Eyes:     Conjunctiva/sclera: Conjunctivae normal.     Pupils: Pupils are equal, round, and reactive to light.  Neck:     Thyroid : No thyromegaly.     Vascular: No carotid bruit or JVD.  Cardiovascular:     Rate and Rhythm: Normal rate and regular rhythm.     Heart sounds: Normal heart sounds.     No gallop.  Pulmonary:     Effort: Pulmonary effort is normal. No respiratory distress.     Breath sounds: Normal breath sounds. No wheezing or rales.  Abdominal:     General: There is no distension or abdominal bruit.     Palpations: Abdomen is soft.  Musculoskeletal:     Left wrist: Tenderness present. No swelling, deformity, snuff box tenderness or crepitus. Normal range of motion. Normal pulse.     Cervical back: Normal range of motion and neck supple.     Right lower leg: No edema.     Left lower leg: No edema.     Comments: Mild lateral wrist tenderness Normal Finkelstein's   Pain primarily with pronation  Normal flex and ext   Lymphadenopathy:     Cervical: No cervical adenopathy.  Skin:    General: Skin is warm and dry.     Coloration: Skin is not pale.     Findings: No rash.  Neurological:     Mental Status: He is alert.     Cranial Nerves: No cranial nerve deficit.     Motor: No weakness.     Coordination: Coordination normal.     Gait: Gait normal.     Deep Tendon Reflexes: Reflexes are normal and symmetric. Reflexes normal.  Psychiatric:        Mood and Affect: Mood normal.           Assessment & Plan:   Problem List Items Addressed This Visit       Cardiovascular and Mediastinum   Essential hypertension   bp in fair control at this time  BP Readings from Last 1 Encounters:  04/17/24 133/81   No changes needed Most recent labs reviewed  Disc lifstyle change with low  sodium diet and exercise  Continues losartan  50 mg bid         Other   Stress reaction - Primary   Pt thinks this caused dizziness he has at last visit  Reviewed note from NP Vincente as well as results with pt  Normal vitals today  Feeling much better  Has hydroxyzine  for situational anx - notes very sedating and only used once   PHQ 9, GAD7 6  Pt declines further treatment/does not feel he needs Stress has decreased (wife getting through breast cancer treatment)  Instructed to reach out if things worsen Call back and Er precautions noted in detail today        Situational anxiety   Improved with improvement in stress Wife is final stages of breast cancer treatment       Left wrist pain   Woke up with this prosimal to thumb No trauma /unsure if overuse  Pain to pronate Per pt this is recurrent  Reassuring exam/no  swelling Neg Finkelstein's test   Will use cold compress/wrist splint at night which helped in past Prescription meloxicam  15 mg daily prn with food (monitoring blood pressure  Update if not starting to improve in a week or if worsening  Call back and Er precautions noted in detail today        Dizziness   Reviewed notes / lab results from NP Kaur Pt thinks this was due to stress reaction and is much better now  Reassuring exam Will continue to follow         [1]  Social History Tobacco Use   Smoking status: Former    Current packs/day: 0.00    Average packs/day: 0.3 packs/day for 35.0 years (8.8 ttl pk-yrs)    Types: Cigarettes    Start date: 10/07/1984    Quit date: 10/08/2019    Years since quitting: 4.5    Passive exposure: Past   Smokeless tobacco: Former    Types: Chew, Snuff   Tobacco comments:    couple of months - 40 yrs ago  Vaping Use   Vaping status: Former  Substance Use Topics   Alcohol use: Yes    Alcohol/week: 24.0 standard drinks of alcohol    Types: 24 Cans of beer per week    Comment: daily    Drug use: No  [2]   Allergies Allergen Reactions   Sertraline Hcl Other (See Comments)    Tremors   Amlodipine  Other (See Comments)    Sexual side effects   Crestor [Rosuvastatin Calcium] Other (See Comments)    Myalgia   Lipitor [Atorvastatin Calcium] Other (See Comments)    Myalgia   Zocor  [Simvastatin  - High Dose] Other (See Comments)    Myalgia   [3]  Current Outpatient Medications on File Prior to Visit  Medication Sig Dispense Refill   cholecalciferol (VITAMIN D3) 25 MCG (1000 UNIT) tablet Take 5,000 Units by mouth daily.     Collagen-Vitamin C-Biotin (COLLAGEN PO) Take 2 tablets by mouth daily at 6 (six) AM.     fexofenadine (ALLEGRA) 180 MG tablet Take 180 mg by mouth daily.      fluticasone  (FLONASE ) 50 MCG/ACT nasal spray SPRAY 2 SPRAYS INTO EACH NOSTRIL EVERY DAY (Patient taking differently: Place 1 spray into both nostrils daily.) 16 g 5   hydrOXYzine  (VISTARIL ) 25 MG capsule Take 1 capsule (25 mg total) by mouth every 8 (eight) hours as needed for anxiety. 30 capsule 0   losartan  (COZAAR ) 50 MG tablet TAKE 1 TABLET (50 MG) BY MOUTH IN THE MORNING AND AT BEDTIME 180 tablet 3   Multiple Vitamins-Minerals (CENTRUM SILVER 50+MEN PO) Take 1 tablet by mouth daily at 6 (six) AM.     REPATHA  SURECLICK 140 MG/ML SOAJ Inject 140 mg into the skin every 14 (fourteen) days. 2 mL 6   tadalafil  (CIALIS ) 20 MG tablet Take 1 tablet (20 mg total) by mouth daily as needed for erectile dysfunction. 30 tablet 3   tadalafil  (CIALIS ) 5 MG tablet Take 1 tablet (5 mg total) by mouth daily. 30 tablet 11   No current facility-administered medications on file prior to visit.   "

## 2024-04-17 NOTE — Patient Instructions (Signed)
 For wrist pain  Use cold compress when able  Wear the brace at night if helps   Take meloxicam  15 mg daily as needed with food  (short term) In the future -diclofenac gel over the counter is also helpful   Stay hydrated Eat regularly with protein   If symptoms return let us  know

## 2024-04-17 NOTE — Patient Instructions (Signed)
 SABRA

## 2024-04-19 NOTE — Assessment & Plan Note (Signed)
 bp in fair control at this time  BP Readings from Last 1 Encounters:  04/17/24 133/81   No changes needed Most recent labs reviewed  Disc lifstyle change with low sodium diet and exercise  Continues losartan  50 mg bid

## 2024-04-19 NOTE — Assessment & Plan Note (Addendum)
 Woke up with this prosimal to thumb No trauma /unsure if overuse  Pain to pronate Per pt this is recurrent  Reassuring exam/no swelling Neg Finkelstein's test   Will use cold compress/wrist splint at night which helped in past Prescription meloxicam  15 mg daily prn with food (monitoring blood pressure  Update if not starting to improve in a week or if worsening  Call back and Er precautions noted in detail today

## 2024-04-19 NOTE — Assessment & Plan Note (Signed)
 Pt thinks this caused dizziness he has at last visit  Reviewed note from NP [facility] as well as results with pt  Normal vitals today  Feeling much better  Has hydroxyzine  for situational anx - notes very sedating and only used once   PHQ 9, GAD7 6  Pt declines further treatment/does not feel he needs Stress has decreased (wife getting through breast cancer treatment)  Instructed to reach out if things worsen Call back and Er precautions noted in detail today

## 2024-04-19 NOTE — Assessment & Plan Note (Signed)
 Improved with improvement in stress Wife is final stages of breast cancer treatment

## 2024-04-19 NOTE — Assessment & Plan Note (Signed)
 Reviewed notes / lab results from NP Kaur Pt thinks this was due to stress reaction and is much better now  Reassuring exam Will continue to follow

## 2024-06-05 ENCOUNTER — Ambulatory Visit: Admitting: Urology

## 2024-06-05 ENCOUNTER — Ambulatory Visit: Payer: BC Managed Care – PPO | Admitting: Internal Medicine

## 2024-06-19 ENCOUNTER — Other Ambulatory Visit (INDEPENDENT_AMBULATORY_CARE_PROVIDER_SITE_OTHER)

## 2024-06-19 ENCOUNTER — Encounter (INDEPENDENT_AMBULATORY_CARE_PROVIDER_SITE_OTHER)

## 2024-06-19 ENCOUNTER — Ambulatory Visit (INDEPENDENT_AMBULATORY_CARE_PROVIDER_SITE_OTHER): Admitting: Vascular Surgery
